# Patient Record
Sex: Female | Born: 1935 | Race: White | Hispanic: No | Smoking: Never smoker
Health system: Southern US, Community
[De-identification: ages and names within clinical notes are randomized; demographics above are authoritative.]

## PROBLEM LIST (undated history)

## (undated) DIAGNOSIS — E119 Type 2 diabetes mellitus without complications: Secondary | ICD-10-CM

## (undated) DIAGNOSIS — I10 Essential (primary) hypertension: Secondary | ICD-10-CM

## (undated) DIAGNOSIS — K219 Gastro-esophageal reflux disease without esophagitis: Secondary | ICD-10-CM

## (undated) DIAGNOSIS — N189 Chronic kidney disease, unspecified: Secondary | ICD-10-CM

## (undated) DIAGNOSIS — C801 Malignant (primary) neoplasm, unspecified: Secondary | ICD-10-CM

## (undated) DIAGNOSIS — E039 Hypothyroidism, unspecified: Secondary | ICD-10-CM

## (undated) DIAGNOSIS — E785 Hyperlipidemia, unspecified: Secondary | ICD-10-CM

## (undated) HISTORY — PX: COLONOSCOPY: SHX174

## (undated) HISTORY — PX: SIGMOIDOSCOPY: SUR1295

## (undated) HISTORY — DX: Essential (primary) hypertension: I10

## (undated) HISTORY — DX: Type 2 diabetes mellitus without complications: E11.9

## (undated) HISTORY — DX: Hyperlipidemia, unspecified: E78.5

## (undated) HISTORY — PX: CHOLECYSTECTOMY: SHX55

## (undated) HISTORY — DX: Chronic kidney disease, unspecified: N18.9

---

## 2011-07-02 ENCOUNTER — Encounter: Payer: Self-pay | Admitting: Gastroenterology

## 2011-07-07 ENCOUNTER — Ambulatory Visit (AMBULATORY_SURGERY_CENTER): Payer: Medicare Other

## 2011-07-07 VITALS — Ht 64.0 in | Wt 140.3 lb

## 2011-07-07 DIAGNOSIS — Z1211 Encounter for screening for malignant neoplasm of colon: Secondary | ICD-10-CM

## 2011-07-07 DIAGNOSIS — Z800 Family history of malignant neoplasm of digestive organs: Secondary | ICD-10-CM

## 2011-07-07 MED ORDER — PEG-KCL-NACL-NASULF-NA ASC-C 100 G PO SOLR: 1.0000 | Freq: Once | ORAL | Status: AC

## 2011-07-20 ENCOUNTER — Ambulatory Visit (AMBULATORY_SURGERY_CENTER): Payer: Medicare Other | Admitting: Gastroenterology

## 2011-07-20 ENCOUNTER — Encounter: Payer: Self-pay | Admitting: Gastroenterology

## 2011-07-20 DIAGNOSIS — D126 Benign neoplasm of colon, unspecified: Secondary | ICD-10-CM

## 2011-07-20 DIAGNOSIS — K5730 Diverticulosis of large intestine without perforation or abscess without bleeding: Secondary | ICD-10-CM | POA: Insufficient documentation

## 2011-07-20 DIAGNOSIS — Z1211 Encounter for screening for malignant neoplasm of colon: Secondary | ICD-10-CM

## 2011-07-20 MED ORDER — SODIUM CHLORIDE 0.9 % IV SOLN: 500.0000 mL | INTRAVENOUS | Status: DC

## 2011-07-20 NOTE — Progress Notes (Signed)
Patient did not experience any of the following events: a burn prior to discharge; a fall within the facility; wrong site/side/patient/procedure/implant event; or a hospital transfer or hospital admission upon discharge from the facility. (G8907) Patient did not have preoperative order for IV antibiotic SSI prophylaxis. (G8918)  

## 2011-07-20 NOTE — Patient Instructions (Signed)

## 2011-07-20 NOTE — Op Note (Addendum)
[facility] [address] [place], [place]  [address]  COLONOSCOPY PROCEDURE REPORT  PATIENT:  Sophia, [name]  MR#:  [id number] BIRTHDATE:  [birth year], 79 yrs. old  GENDER:  female ENDOSCOPIST:  Vania Rea. Jarold Motto, MD, [facility] REF. BY:  Paulita Cradle, N.P. PROCEDURE DATE:  07/20/2011 PROCEDURE:  Colonoscopy with biopsy ASA CLASS:  Class III INDICATIONS:  Routine Risk Screening, family history of colon cancer SISTER MEDICATIONS:   propofol (Diprivan) 120 mg IV  DESCRIPTION OF PROCEDURE:   After the risks and benefits and of the procedure were explained, informed consent was obtained. Digital rectal exam was performed and revealed no abnormalities. The LB 180AL [id number] endoscope was introduced through the anus and advanced to the cecum, which was identified by both the appendix and ileocecal valve.  The quality of the prep was excellent, using MoviPrep.  The instrument was then slowly withdrawn as the colon was fully examined. <<PROCEDUREIMAGES>>  FINDINGS:  There were mild diverticular changes in left colon. diverticulosis was found. POLYP COLD SNARE REMOVED IN RECTUM  A diminutive polyp was found in the rectum.   Retroflexed views in the rectum revealed no abnormalities.    The scope was then withdrawn from the patient and the procedure completed.  COMPLICATIONS:  None ENDOSCOPIC IMPRESSION: 1) Diverticulosis,mild,left sided diverticulosis 2) Diminutive polyp in the rectum R/O ADENOMA RECOMMENDATIONS: 1) Continue current medications 2) High fiber diet.  REPEAT EXAM:  No  ______________________________ Vania Rea. Jarold Motto, MD, [facility]  CC:  Rudi Heap, MD  n. REVISED:  07/20/2011 10:20 AM eSIGNED:   Vania Rea. Marlo Arriola at 07/20/2011 10:20 AM  Philis Nettle, [id number]

## 2011-07-21 ENCOUNTER — Telehealth: Payer: Self-pay

## 2011-07-21 NOTE — Telephone Encounter (Signed)
  Follow up Call-  Call back number 07/20/2011  Post procedure Call Back phone  # [phone]  Permission to leave phone message No     Patient questions:  Do you have a fever, pain , or abdominal swelling? no Pain Score  0 *  Have you tolerated food without any problems? yes  Have you been able to return to your normal activities? yes  Do you have any questions about your discharge instructions: Diet   no Medications  no Follow up visit  no  Do you have questions or concerns about your Care? no  Actions: * If pain score is 4 or above: No action needed, pain <4.

## 2011-07-24 ENCOUNTER — Encounter: Payer: Self-pay | Admitting: Gastroenterology

## 2012-09-09 ENCOUNTER — Other Ambulatory Visit: Payer: Self-pay

## 2012-09-09 MED ORDER — LISINOPRIL 5 MG PO TABS: 5.0000 mg | ORAL_TABLET | Freq: Every day | ORAL | Status: DC

## 2012-10-21 ENCOUNTER — Telehealth: Payer: Self-pay | Admitting: Nurse Practitioner

## 2012-10-21 NOTE — Telephone Encounter (Signed)
appt made with wong on monday

## 2012-10-24 ENCOUNTER — Encounter: Payer: Self-pay | Admitting: Family Medicine

## 2012-10-24 ENCOUNTER — Ambulatory Visit (INDEPENDENT_AMBULATORY_CARE_PROVIDER_SITE_OTHER): Payer: Medicare Other | Admitting: Family Medicine

## 2012-10-24 VITALS — BP 156/72 | HR 65 | Temp 97.5°F | Wt 141.2 lb

## 2012-10-24 DIAGNOSIS — R7309 Other abnormal glucose: Secondary | ICD-10-CM

## 2012-10-24 DIAGNOSIS — E559 Vitamin D deficiency, unspecified: Secondary | ICD-10-CM | POA: Insufficient documentation

## 2012-10-24 DIAGNOSIS — M810 Age-related osteoporosis without current pathological fracture: Secondary | ICD-10-CM | POA: Insufficient documentation

## 2012-10-24 DIAGNOSIS — I10 Essential (primary) hypertension: Secondary | ICD-10-CM | POA: Insufficient documentation

## 2012-10-24 DIAGNOSIS — E785 Hyperlipidemia, unspecified: Secondary | ICD-10-CM | POA: Insufficient documentation

## 2012-10-24 DIAGNOSIS — R739 Hyperglycemia, unspecified: Secondary | ICD-10-CM | POA: Insufficient documentation

## 2012-10-24 LAB — COMPLETE METABOLIC PANEL WITH GFR
ALT: 15 U/L (ref 0–35)
AST: 22 U/L (ref 0–37)
Albumin: 4.3 g/dL (ref 3.5–5.2)
Alkaline Phosphatase: 47 U/L (ref 39–117)
BUN: 18 mg/dL (ref 6–23)
CO2: 24 mEq/L (ref 19–32)
Calcium: 9.9 mg/dL (ref 8.4–10.5)
Chloride: 107 mEq/L (ref 96–112)
Creat: 1.28 mg/dL — ABNORMAL HIGH (ref 0.50–1.10)
GFR, Est African American: 46 mL/min — ABNORMAL LOW
GFR, Est Non African American: 40 mL/min — ABNORMAL LOW
Glucose, Bld: 99 mg/dL (ref 70–99)
Potassium: 4.5 mEq/L (ref 3.5–5.3)
Sodium: 141 mEq/L (ref 135–145)
Total Bilirubin: 0.8 mg/dL (ref 0.3–1.2)
Total Protein: 7.3 g/dL (ref 6.0–8.3)

## 2012-10-24 LAB — POCT GLYCOSYLATED HEMOGLOBIN (HGB A1C): Hemoglobin A1C: 6.7

## 2012-10-24 MED ORDER — LISINOPRIL 5 MG PO TABS: 5.0000 mg | ORAL_TABLET | Freq: Every day | ORAL | Status: DC

## 2012-10-24 NOTE — Progress Notes (Signed)
Patient ID: Sophia [name], female   DOB: [birth year], 79 y.o.   MRN: [id number] SUBJECTIVE: Chief Complaint  Patient presents with  . Follow-up    3 month pt of DFS  needs refills [facility] fasting   HPI: Patient is here for follow up of hyperlipidemia/hypertension/hyperglycemia:  denies Headache;denies Chest Pain;denies weakness;denies Shortness of Breath and orthopnea;denies Visual changes;denies palpitations;denies cough;denies pedal edema;denies symptoms of TIA or stroke;deniesClaudication symptoms. admits to Compliance with medications; denies Problems with medications.  PMH/PSH: reviewed/updated in Epic  SH/FH: reviewed/updated in Epic  Allergies: reviewed/updated in Epic  Medications: reviewed/updated in Epic  Immunizations: reviewed/updated in Epic  ROS: As above in the HPI. All other systems are stable or negative.  OBJECTIVE: APPEARANCE:  Patient in no acute distress.The patient appeared well nourished and normally developed. Acyanotic. Waist: VITAL SIGNS:BP 156/72  Pulse 65  Temp(Src) 97.5 F (36.4 C) (Oral)  Wt 141 lb 3.2 oz (64.048 kg)  BMI 24.23 kg/m2 Recheck 138/80 WF   SKIN: warm and  Dry without overt rashes, tattoos and scars  HEAD and Neck: without JVD, Head and scalp: normal Eyes:No scleral icterus. Fundi normal, eye movements normal. Ears: Auricle normal, canal normal, Tympanic membranes normal, insufflation normal. Nose: normal Throat: normal Neck & thyroid: normal  CHEST & LUNGS: Chest wall: normal Lungs: Clear  CVS: Reveals the PMI to be normally located. Regular rhythm, First and Second Heart sounds are normal,  absence of murmurs, rubs or gallops. Peripheral vasculature: Radial pulses: normal Dorsal pedis pulses: normal Posterior pulses: normal  ABDOMEN:  Appearance: normal Benign, no organomegaly, no masses, no Abdominal Aortic enlargement. No Guarding , no rebound. No Bruits. Bowel sounds: normal  RECTAL: N/A GU:  N/A  EXTREMETIES: nonedematous. Both Femoral and Pedal pulses are normal.  MUSCULOSKELETAL:  Spine: normal Joints: intact  NEUROLOGIC: oriented to time,place and person; nonfocal. Strength is normal Sensory is normal Reflexes are normal Cranial Nerves are normal.  ASSESSMENT: HTN (hypertension) - Plan: COMPLETE METABOLIC PANEL WITH GFR, lisinopril (PRINIVIL,ZESTRIL) 5 MG tablet  HLD (hyperlipidemia) - Plan: COMPLETE METABOLIC PANEL WITH GFR, NMR Lipoprofile with Lipids  Hyperglycemia - Plan: COMPLETE METABOLIC PANEL WITH GFR, POCT glycosylated hemoglobin (Hb A1C)  Unspecified vitamin D deficiency - Plan: Vitamin D 25 hydroxy  Osteoporosis, unspecified   PLAN: Orders Placed This Encounter  Procedures  . COMPLETE METABOLIC PANEL WITH GFR  . NMR Lipoprofile with Lipids  . Vitamin D 25 hydroxy  . POCT glycosylated hemoglobin (Hb A1C)    No results found for this or any previous visit. Meds ordered this encounter  Medications  . lisinopril (PRINIVIL,ZESTRIL) 5 MG tablet    Sig: Take 1 tablet (5 mg total) by mouth daily.    Dispense:  30 tablet    Refill:  11         Dr Woodroe Mode Recommendations  Diet and Exercise discussed with patient.  For nutrition information, I recommend books:  1).Eat to Live by Dr Monico Hoar. 2).Prevent and Reverse Heart Disease by Dr Suzzette Righter. 3) Dr Katherina Right Book: Reversing Diabetes  Exercise recommendations are:  If unable to walk, then the patient can exercise in a chair 3 times a day. By flapping arms like a bird gently and raising legs outwards to the front.  If ambulatory, the patient can go for walks for 30 minutes 3 times a week. Then increase the intensity and duration as tolerated.  Goal is to try to attain exercise frequency to 5 times a week.  If applicable: Best  to perform resistance exercises (machines or weights) 2 days a week and cardio type exercises 3 days per week.  Return in about 3 months  (around 01/24/2013) for Recheck medical problems.  Rilynn Habel P. Modesto Charon, M.D.

## 2012-10-24 NOTE — Patient Instructions (Signed)
      Dr Makynleigh Breslin's Recommendations  Diet and Exercise discussed with patient.  For nutrition information, I recommend books:  1).Eat to Live by Dr Joel Fuhrman. 2).Prevent and Reverse Heart Disease by Dr Caldwell Esselstyn. 3) Dr Neal Barnard's Book: Reversing Diabetes  Exercise recommendations are:  If unable to walk, then the patient can exercise in a chair 3 times a day. By flapping arms like a bird gently and raising legs outwards to the front.  If ambulatory, the patient can go for walks for 30 minutes 3 times a week. Then increase the intensity and duration as tolerated.  Goal is to try to attain exercise frequency to 5 times a week.  If applicable: Best to perform resistance exercises (machines or weights) 2 days a week and cardio type exercises 3 days per week.  

## 2012-10-25 LAB — NMR LIPOPROFILE WITH LIPIDS
Cholesterol, Total: 181 mg/dL (ref <200)
HDL Particle Number: 37.6 umol/L (ref >=30.5)
HDL Size: 9.3 nm (ref >=9.2)
HDL-C: 64 mg/dL (ref >=40)
LDL (calc): 92 mg/dL (ref <100)
LDL Particle Number: 1131 nmol/L — ABNORMAL HIGH (ref <1000)
LDL Size: 21.2 nm (ref >20.5)
LP-IR Score: 39 (ref <=45)
Large HDL-P: 8.6 umol/L (ref >=4.8)
Large VLDL-P: 3.2 nmol/L — ABNORMAL HIGH (ref <=2.7)
Small LDL Particle Number: 430 nmol/L (ref <=527)
Triglycerides: 123 mg/dL (ref <150)
VLDL Size: 46.8 nm — ABNORMAL HIGH (ref <=46.6)

## 2012-10-25 LAB — VITAMIN D 25 HYDROXY (VIT D DEFICIENCY, FRACTURES): Vit D, 25-Hydroxy: 27 ng/mL — ABNORMAL LOW (ref 30–89)

## 2012-10-28 ENCOUNTER — Other Ambulatory Visit: Payer: Self-pay | Admitting: Family Medicine

## 2012-11-28 ENCOUNTER — Other Ambulatory Visit: Payer: Self-pay | Admitting: *Deleted

## 2012-11-28 MED ORDER — NIACIN-SIMVASTATIN ER 1000-20 MG PO TB24: 1.0000 | ORAL_TABLET | Freq: Every day | ORAL | Status: DC

## 2012-11-30 ENCOUNTER — Encounter: Payer: Self-pay | Admitting: *Deleted

## 2012-12-26 ENCOUNTER — Ambulatory Visit: Payer: Self-pay

## 2013-01-05 ENCOUNTER — Ambulatory Visit (INDEPENDENT_AMBULATORY_CARE_PROVIDER_SITE_OTHER): Payer: Medicare Other | Admitting: Pharmacist

## 2013-01-05 VITALS — BP 146/82 | HR 64 | Ht 64.0 in | Wt 140.5 lb

## 2013-01-05 DIAGNOSIS — M810 Age-related osteoporosis without current pathological fracture: Secondary | ICD-10-CM

## 2013-01-05 DIAGNOSIS — R7309 Other abnormal glucose: Secondary | ICD-10-CM

## 2013-01-05 DIAGNOSIS — E559 Vitamin D deficiency, unspecified: Secondary | ICD-10-CM

## 2013-01-05 DIAGNOSIS — R739 Hyperglycemia, unspecified: Secondary | ICD-10-CM

## 2013-01-05 DIAGNOSIS — E785 Hyperlipidemia, unspecified: Secondary | ICD-10-CM

## 2013-01-05 DIAGNOSIS — I10 Essential (primary) hypertension: Secondary | ICD-10-CM

## 2013-01-05 MED ORDER — SIMVASTATIN 40 MG PO TABS: 40.0000 mg | ORAL_TABLET | Freq: Every evening | ORAL | Status: DC

## 2013-01-05 MED ORDER — VALSARTAN-HYDROCHLOROTHIAZIDE 320-25 MG PO TABS: 1.0000 | ORAL_TABLET | Freq: Every day | ORAL | Status: DC

## 2013-01-05 NOTE — Progress Notes (Signed)
Lipid Clinic Consultation  Chief Complaint:   Chief Complaint  Patient presents with  . Diabetes  . Hyperlipidemia  . Hypertension   HPI:   Patient is referred by Dr Modesto Charon for evaluation of hyperlipidemia and newly diagnosed type 2 DM.  Her last labs showed a normal BG of 99 but A1C was 6.7%. She also has elevated LDL-P of 1131.  She also mentions that she thinks that her lisinopril might be causing a cough.  She has heard that that type of medication can cause a cough.  Current medications include: Simcor 20/1000mg  daily, lisinopril 5mg  daily, Benicar HCT 40/25mg  daily,  ASA 81mg  daily, Calcium + Vit D 1 tablet BID, MVI daily, Vitamin D 1000IU daily (just restarted in June 2014).   Note - her profile showed that she was taking alendronate but she has not taken since 02/2012.  She was started on Evista 02/2012 but did not continue due to cost.  Exaam: Edema:  nagative  Carotid Bruits:  negative Xanthomas:  negative General Appearance:  alert, oriented, no acute distress and well nourished Mood/Affect:  normal  Lipid Panel     Component Value Date/Time   TRIG 123 10/24/2012 1139   LDLCALC 92 10/24/2012 1139  LDL-P was 1131    Assessment: CHD/CHF Risk Equivalents:  DM  NCEP Risk Factors Present:  HTN and age Primary Problem(s):  LDL or LDL-P elevated  Current NCEP Goals: LDL Goal < 100 HDL Goal >/= 45 Tg Goal < 829 Non-HDL Goal < 130  Low fat diet followed?  No -   Low carb diet followed?  No -   Exercise?  No -    Assessment: 1. Newly Diagnosed diabetes 2. Dyslipidemia - not at goals and current therapy possibly worsening DM. 3.  HTN - concern that cough might be related to ACE-Inhibitor / lisinopril  Recommendations: 1.  Discussed CHO serving sizes and effects on BG.   2.  Discontinue Simcor.  Start sinvastatin 40mg  daily 3.  Discontinue lisinopril to see if cough resolves.  Also discontinue Benicar HCT and switch to cheaper valsartan HCTZ 320/25mg  1 tablet daily.   Continue to monitor BP daily at home.  4.  Dicussed restarting evista since it is now generic but since we are making other changes today will discuss at next appt.   RTC in 1 month to see Dr Modesto Charon.  RTC in 2 months clinical pharmacist

## 2013-01-31 ENCOUNTER — Ambulatory Visit (INDEPENDENT_AMBULATORY_CARE_PROVIDER_SITE_OTHER): Payer: Medicare Other | Admitting: Family Medicine

## 2013-01-31 ENCOUNTER — Encounter: Payer: Self-pay | Admitting: Family Medicine

## 2013-01-31 VITALS — BP 140/82 | HR 78 | Temp 97.0°F | Ht 63.5 in | Wt 132.6 lb

## 2013-01-31 DIAGNOSIS — E559 Vitamin D deficiency, unspecified: Secondary | ICD-10-CM

## 2013-01-31 DIAGNOSIS — E785 Hyperlipidemia, unspecified: Secondary | ICD-10-CM

## 2013-01-31 DIAGNOSIS — M810 Age-related osteoporosis without current pathological fracture: Secondary | ICD-10-CM

## 2013-01-31 DIAGNOSIS — I10 Essential (primary) hypertension: Secondary | ICD-10-CM

## 2013-01-31 DIAGNOSIS — E119 Type 2 diabetes mellitus without complications: Secondary | ICD-10-CM | POA: Insufficient documentation

## 2013-01-31 DIAGNOSIS — R7309 Other abnormal glucose: Secondary | ICD-10-CM

## 2013-01-31 DIAGNOSIS — N189 Chronic kidney disease, unspecified: Secondary | ICD-10-CM | POA: Insufficient documentation

## 2013-01-31 DIAGNOSIS — R739 Hyperglycemia, unspecified: Secondary | ICD-10-CM

## 2013-01-31 LAB — POCT GLYCOSYLATED HEMOGLOBIN (HGB A1C): Hemoglobin A1C: 6.1

## 2013-01-31 NOTE — Progress Notes (Signed)
Patient ID: Sophia [name], female   DOB: [birth year], 79 y.o.   MRN: [id number] SUBJECTIVE: CC: Chief Complaint  Patient presents with  . Follow-up    3 month  fastng    HPI: Had a tear duct infection and was on antibiotics which made her sick and she lost weight from lost apetite.  Patient is here for follow up of Diabetes Mellitus/CKD/HTN/HLD: Symptoms evaluated: Denies Nocturia ,Denies Urinary Frequency , denies Blurred vision ,deniesDizziness,denies.Dysuria,denies paresthesias, denies extremity pain or ulcers.Marland Kitchendenies chest pain. has had an annual eye exam. do check the feet. Does check CBGs. Average CBG: Denies episodes of hypoglycemia. Does have an emergency hypoglycemic plan. admits toCompliance with medications. Denies Problems with medications.  Past Medical History  Diagnosis Date  . Hypertension   . Hyperlipidemia    Past Surgical History  Procedure Laterality Date  . Cholecystectomy    . Sigmoidoscopy     History   Social History  . Marital Status: Married    Spouse Name: N/A    Number of Children: N/A  . Years of Education: N/A   Occupational History  . Not on file.   Social History Main Topics  . Smoking status: Never Smoker   . Smokeless tobacco: Never Used  . Alcohol Use: No  . Drug Use: No  . Sexual Activity: Not on file   Other Topics Concern  . Not on file   Social History Narrative  . No narrative on file   Family History  Problem Relation Age of Onset  . Heart disease Father   . Colon cancer Sister   . Heart disease Brother   . Heart disease Sister    Current Outpatient Prescriptions on File Prior to Visit  Medication Sig Dispense Refill  . aspirin 81 MG tablet Take 81 mg by mouth daily.      . CA & PHOS-VIT D-MAG PO Take by mouth. Take 2 daily      . Cholecalciferol (VITAMIN D-3 PO) Take by mouth. Take 1200 mg daily      . Multiple Vitamins-Minerals (MULTIVITAMIN WITH MINERALS) tablet Take 1 tablet by mouth daily.      .  simvastatin (ZOCOR) 40 MG tablet Take 1 tablet (40 mg total) by mouth every evening.  90 tablet  1  . valsartan-hydrochlorothiazide (DIOVAN HCT) 320-25 MG per tablet Take 1 tablet by mouth daily.  90 tablet  1   No current facility-administered medications on file prior to visit.   No Known Allergies Immunization History  Administered Date(s) Administered  . Pneumococcal Polysaccharide 05/18/2002   Prior to Admission medications   Medication Sig Start Date End Date Taking? Authorizing Provider  aspirin 81 MG tablet Take 81 mg by mouth daily.   Yes Historical Provider, MD  BENICAR HCT 40-25 MG per tablet  10/31/12  Yes Historical Provider, MD  CA & PHOS-VIT D-MAG PO Take by mouth. Take 2 daily   Yes Historical Provider, MD  Cholecalciferol (VITAMIN D-3 PO) Take by mouth. Take 1200 mg daily   Yes Historical Provider, MD  hydrocortisone cream 1 %  12/08/12  Yes Historical Provider, MD  Multiple Vitamins-Minerals (MULTIVITAMIN WITH MINERALS) tablet Take 1 tablet by mouth daily.   Yes Historical Provider, MD  neomycin-polymyxin b-dexamethasone (MAXITROL) 3.5-10000-0.1 OINT  01/16/13  Yes Historical Provider, MD  simvastatin (ZOCOR) 40 MG tablet Take 1 tablet (40 mg total) by mouth every evening. 01/05/13  Yes Tammy Eckard, PHARMD  valsartan-hydrochlorothiazide (DIOVAN HCT) 320-25 MG per tablet Take 1 tablet by  mouth daily. 01/05/13   Tammy Eckard, PHARMD    ROS: As above in the HPI. All other systems are stable or negative.  OBJECTIVE: APPEARANCE:  Patient in no acute distress.The patient appeared well nourished and normally developed. Acyanotic. Waist: VITAL SIGNS:BP 140/82  Pulse 78  Temp(Src) 97 F (36.1 C) (Oral)  Ht 5' 3.5" (1.613 m)  Wt 132 lb 9.6 oz (60.147 kg)  BMI 23.12 kg/m2 WF  SKIN: warm and  Dry without overt rashes, tattoos and scars  HEAD and Neck: without JVD, Head and scalp: normal Eyes:No scleral icterus. Fundi normal, eye movements normal. Ears: Auricle normal,  canal normal, Tympanic membranes normal, insufflation normal. Nose: normal Throat: normal Neck & thyroid: normal  CHEST & LUNGS: Chest wall: normal Lungs: Clear  CVS: Reveals the PMI to be normally located. Regular rhythm, First and Second Heart sounds are normal,  absence of murmurs, rubs or gallops. Peripheral vasculature: Radial pulses: normal Dorsal pedis pulses: normal Posterior pulses: normal  ABDOMEN:  Appearance: normal Benign, no organomegaly, no masses, no Abdominal Aortic enlargement. No Guarding , no rebound. No Bruits. Bowel sounds: normal  RECTAL: N/A GU: N/A  EXTREMETIES: nonedematous.  MUSCULOSKELETAL:  Spine: normal Joints: intact  NEUROLOGIC: oriented to time,place and person; nonfocal. Strength is normal Sensory is normal Reflexes are normal Cranial Nerves are normal. Results for orders placed in visit on 01/31/13  POCT GLYCOSYLATED HEMOGLOBIN (HGB A1C)      Result Value Range   Hemoglobin A1C 6.1      ASSESSMENT: Hyperglycemia - Plan: POCT glycosylated hemoglobin (Hb A1C), CMP14+EGFR  HLD (hyperlipidemia) - Plan: CMP14+EGFR, NMR, lipoprofile  Chronic kidney disease - Plan: CMP14+EGFR  HTN (hypertension) - Plan: CMP14+EGFR  Osteoporosis, unspecified - Plan: Vit D  25 hydroxy (rtn osteoporosis monitoring)  Unspecified vitamin D deficiency - Plan: Vit D  25 hydroxy (rtn osteoporosis monitoring)  PLAN: Orders Placed This Encounter  Procedures  . CMP14+EGFR  . NMR, lipoprofile  . Vit D  25 hydroxy (rtn osteoporosis monitoring)  . POCT glycosylated hemoglobin (Hb A1C)   Meds ordered this encounter  Medications  . hydrocortisone cream 1 %    Sig:   . neomycin-polymyxin b-dexamethasone (MAXITROL) 3.5-10000-0.1 OINT    Sig:   . BENICAR HCT 40-25 MG per tablet    Sig:    Keep active, balanced diet and exercise. Continue with Tammy's recommendations.  Return in about 3 months (around 05/02/2013) for Recheck medical  problems.  Jameela Michna P. Modesto Charon, M.D.

## 2013-02-02 LAB — NMR, LIPOPROFILE
Cholesterol: 140 mg/dL (ref <200)
HDL Cholesterol by NMR: 80 mg/dL (ref >=40)
HDL Particle Number: 38.3 umol/L (ref >=30.5)
LDL Particle Number: 519 nmol/L (ref <1000)
LDL Size: 21.7 nm (ref >20.5)
LDLC SERPL CALC-MCNC: 43 mg/dL (ref <100)
LP-IR Score: <25 (ref <=45)
Small LDL Particle Number: <90 nmol/L (ref <=527)
Triglycerides by NMR: 86 mg/dL (ref <150)

## 2013-02-02 LAB — CMP14+EGFR
ALT: 15 IU/L (ref 0–32)
AST: 26 IU/L (ref 0–40)
Albumin/Globulin Ratio: 1.5 (ref 1.1–2.5)
Albumin: 4.5 g/dL (ref 3.5–4.8)
Alkaline Phosphatase: 63 IU/L (ref 39–117)
BUN/Creatinine Ratio: 29 — ABNORMAL HIGH (ref 11–26)
BUN: 39 mg/dL — ABNORMAL HIGH (ref 8–27)
CO2: 25 mmol/L (ref 18–29)
Calcium: 10.5 mg/dL — ABNORMAL HIGH (ref 8.6–10.2)
Chloride: 101 mmol/L (ref 97–108)
Creatinine, Ser: 1.35 mg/dL — ABNORMAL HIGH (ref 0.57–1.00)
GFR calc Af Amer: 43 mL/min/{1.73_m2} — ABNORMAL LOW (ref >59)
GFR calc non Af Amer: 38 mL/min/{1.73_m2} — ABNORMAL LOW (ref >59)
Globulin, Total: 3 g/dL (ref 1.5–4.5)
Glucose: 99 mg/dL (ref 65–99)
Potassium: 5 mmol/L (ref 3.5–5.2)
Sodium: 143 mmol/L (ref 134–144)
Total Bilirubin: 0.8 mg/dL (ref 0.0–1.2)
Total Protein: 7.5 g/dL (ref 6.0–8.5)

## 2013-02-02 LAB — VITAMIN D 25 HYDROXY (VIT D DEFICIENCY, FRACTURES): Vit D, 25-Hydroxy: 32.7 ng/mL (ref 30.0–100.0)

## 2013-05-05 ENCOUNTER — Ambulatory Visit: Payer: Medicare Other | Admitting: Family Medicine

## 2013-05-30 ENCOUNTER — Ambulatory Visit: Payer: Medicare Other | Admitting: Family Medicine

## 2013-06-22 ENCOUNTER — Ambulatory Visit (INDEPENDENT_AMBULATORY_CARE_PROVIDER_SITE_OTHER): Payer: Medicare Other | Admitting: Family Medicine

## 2013-06-22 ENCOUNTER — Encounter: Payer: Self-pay | Admitting: Family Medicine

## 2013-06-22 VITALS — BP 150/73 | HR 70 | Temp 98.0°F | Ht 64.0 in | Wt 137.8 lb

## 2013-06-22 DIAGNOSIS — I10 Essential (primary) hypertension: Secondary | ICD-10-CM

## 2013-06-22 DIAGNOSIS — R7309 Other abnormal glucose: Secondary | ICD-10-CM | POA: Diagnosis not present

## 2013-06-22 DIAGNOSIS — E785 Hyperlipidemia, unspecified: Secondary | ICD-10-CM | POA: Diagnosis not present

## 2013-06-22 DIAGNOSIS — E559 Vitamin D deficiency, unspecified: Secondary | ICD-10-CM | POA: Diagnosis not present

## 2013-06-22 DIAGNOSIS — N189 Chronic kidney disease, unspecified: Secondary | ICD-10-CM | POA: Diagnosis not present

## 2013-06-22 DIAGNOSIS — R739 Hyperglycemia, unspecified: Secondary | ICD-10-CM

## 2013-06-22 DIAGNOSIS — M810 Age-related osteoporosis without current pathological fracture: Secondary | ICD-10-CM

## 2013-06-22 LAB — POCT GLYCOSYLATED HEMOGLOBIN (HGB A1C): Hemoglobin A1C: 6.2

## 2013-06-22 NOTE — Progress Notes (Signed)
Patient ID: Draven Laine, female   DOB: Nov 10, 1931, 80 y.o.   MRN: [id number] SUBJECTIVE: CC: Chief Complaint  Patient presents with  . Follow-up    3 month follow up chronic problems     HPI: Patient is here for follow up of Diabetes Mellitus/HLD/HTN/CKD: Symptoms evaluated: Denies Nocturia ,Denies Urinary Frequency , denies Blurred vision ,deniesDizziness,denies.Dysuria,denies paresthesias, denies extremity pain or ulcers.Marland Kitchendenies chest pain. has had an annual eye exam. do check the feet. Denies episodes of hypoglycemia. Does have an emergency hypoglycemic plan. admits toCompliance with medications. Denies Problems with medications.: did reduce the simvastatin to 1/2 of the 40 mg dose.    Past Medical History  Diagnosis Date  . Hypertension   . Hyperlipidemia   . Diabetes mellitus without complication   . Chronic kidney disease    Past Surgical History  Procedure Laterality Date  . Cholecystectomy    . Sigmoidoscopy     History   Social History  . Marital Status: Married    Spouse Name: N/A    Number of Children: N/A  . Years of Education: N/A   Occupational History  . Not on file.   Social History Main Topics  . Smoking status: Never Smoker   . Smokeless tobacco: Never Used  . Alcohol Use: No  . Drug Use: No  . Sexual Activity: Not on file   Other Topics Concern  . Not on file   Social History Narrative  . No narrative on file   Family History  Problem Relation Age of Onset  . Heart disease Father   . Colon cancer Sister   . Heart disease Brother   . Heart disease Sister    Current Outpatient Prescriptions on File Prior to Visit  Medication Sig Dispense Refill  . aspirin 81 MG tablet Take 81 mg by mouth daily.      . Cholecalciferol (VITAMIN D-3 PO) Take by mouth. Take 1200 mg daily      . valsartan-hydrochlorothiazide (DIOVAN HCT) 320-25 MG per tablet Take 1 tablet by mouth daily.  90 tablet  1  . CA & PHOS-VIT D-MAG PO Take by mouth. Take  2 daily      . hydrocortisone cream 1 %       . Multiple Vitamins-Minerals (MULTIVITAMIN WITH MINERALS) tablet Take 1 tablet by mouth daily.      Marland Kitchen neomycin-polymyxin b-dexamethasone (MAXITROL) 3.5-10000-0.1 OINT        No current facility-administered medications on file prior to visit.   No Known Allergies Immunization History  Administered Date(s) Administered  . Influenza, High Dose Seasonal PF 03/13/2013  . Pneumococcal Polysaccharide-23 05/18/2002   Prior to Admission medications   Medication Sig Start Date End Date Taking? Authorizing Provider  aspirin 81 MG tablet Take 81 mg by mouth daily.    Historical Provider, MD  BENICAR HCT 40-25 MG per tablet  10/31/12   Historical Provider, MD  CA & PHOS-VIT D-MAG PO Take by mouth. Take 2 daily    Historical Provider, MD  Cholecalciferol (VITAMIN D-3 PO) Take by mouth. Take 1200 mg daily    Historical Provider, MD  hydrocortisone cream 1 %  12/08/12   Historical Provider, MD  Multiple Vitamins-Minerals (MULTIVITAMIN WITH MINERALS) tablet Take 1 tablet by mouth daily.    Historical Provider, MD  neomycin-polymyxin b-dexamethasone (MAXITROL) 3.5-10000-0.1 OINT  01/16/13   Historical Provider, MD  simvastatin (ZOCOR) 40 MG tablet Take 1 tablet (40 mg total) by mouth every evening. 01/05/13   Cherre Robins, PHARMD  valsartan-hydrochlorothiazide (DIOVAN HCT) 320-25 MG per tablet Take 1 tablet by mouth daily. 01/05/13   Tammy Eckard, PHARMD     ROS: As above in the HPI. All other systems are stable or negative.  OBJECTIVE: APPEARANCE:  Patient in no acute distress.The patient appeared well nourished and normally developed. Acyanotic. Waist: VITAL SIGNS:BP 150/73  Pulse 70  Temp(Src) 98 F (36.7 C) (Oral)  Ht _0  (1.626 m)  Wt 137 lb 12.8 oz (62.506 kg)  BMI 23.64 kg/m2  WF  SKIN: warm and  Dry without overt rashes, tattoos and scars  HEAD and Neck: without JVD, Head and scalp: normal Eyes:No scleral icterus. Fundi normal, eye  movements normal. Ears: Auricle normal, canal normal, Tympanic membranes normal, insufflation normal. Nose: normal Throat: normal Neck & thyroid: normal  CHEST & LUNGS: Chest wall: normal Lungs: Clear  CVS: Reveals the PMI to be normally located. Regular rhythm, First and Second Heart sounds are normal,  absence of murmurs, rubs or gallops. Peripheral vasculature: Radial pulses: normal Dorsal pedis pulses: normal Posterior pulses: normal  ABDOMEN:  Appearance: normal Benign, no organomegaly, no masses, no Abdominal Aortic enlargement. No Guarding , no rebound. No Bruits. Bowel sounds: normal  RECTAL: N/A GU: N/A  EXTREMETIES: nonedematous.  MUSCULOSKELETAL:  Spine: normal Joints: intact  NEUROLOGIC: oriented to time,place and person; nonfocal. Strength is normal Sensory is normal Reflexes are normal Cranial Nerves are normal.   Results for orders placed in visit on 01/31/13  CMP14+EGFR      Result Value Range   Glucose 99  65 - 99 mg/dL   BUN 39 (*) 8 - 27 mg/dL   Creatinine, Ser 1.35 (*) 0.57 - 1.00 mg/dL   GFR calc non Af Amer 38 (*) >59 mL/min/1.73   GFR calc Af Amer 43 (*) >59 mL/min/1.73   BUN/Creatinine Ratio 29 (*) 11 - 26   Sodium 143  134 - 144 mmol/L   Potassium 5.0  3.5 - 5.2 mmol/L   Chloride 101  97 - 108 mmol/L   CO2 25  18 - 29 mmol/L   Calcium 10.5 (*) 8.6 - 10.2 mg/dL   Total Protein 7.5  6.0 - 8.5 g/dL   Albumin 4.5  3.5 - 4.8 g/dL   Globulin, Total 3.0  1.5 - 4.5 g/dL   Albumin/Globulin Ratio 1.5  1.1 - 2.5   Total Bilirubin 0.8  0.0 - 1.2 mg/dL   Alkaline Phosphatase 63  39 - 117 IU/L   AST 26  0 - 40 IU/L   ALT 15  0 - 32 IU/L  NMR, LIPOPROFILE      Result Value Range   LDL Particle Number 519  <1000 nmol/L   LDLC SERPL CALC-MCNC 43  <100 mg/dL   HDL Cholesterol by NMR 80  >=40 mg/dL   Triglycerides by NMR 86  <150 mg/dL   Cholesterol 140  <200 mg/dL   HDL Particle Number 38.3  >=30.5 umol/L   Small LDL Particle Number < 90   <= 527 nmol/L   LDL Size 21.7  >20.5 nm   LP-IR Score < 25  <= 45  VITAMIN D 25 HYDROXY      Result Value Range   Vit D, 25-Hydroxy 32.7  30.0 - 100.0 ng/mL  POCT GLYCOSYLATED HEMOGLOBIN (HGB A1C)      Result Value Range   Hemoglobin A1C 6.1      ASSESSMENT:  HTN (hypertension) - Plan: CMP14+EGFR  HLD (hyperlipidemia) - Plan: CMP14+EGFR, NMR, lipoprofile  Chronic  kidney disease  Unspecified vitamin D deficiency  Osteoporosis, unspecified  Hyperglycemia - Plan: POCT glycosylated hemoglobin (Hb A1C)  PLAN: DASH DIET in the AVS Continue present regimen. Await labwork.  Orders Placed This Encounter  Procedures  . CMP14+EGFR  . NMR, lipoprofile  . POCT glycosylated hemoglobin (Hb A1C)   Meds ordered this encounter  Medications  . simvastatin (ZOCOR) 40 MG tablet    Sig: Take 20 mg by mouth every evening.   Medications Discontinued During This Encounter  Medication Reason  . simvastatin (ZOCOR) 40 MG tablet   . BENICAR HCT 40-25 MG per tablet Formulary change   Return in about 3 months (around 09/19/2013) for Recheck medical problems.  Hong Moring P. Jacelyn Grip, M.D.

## 2013-06-22 NOTE — Patient Instructions (Signed)
DASH Diet  The DASH diet stands for "Dietary Approaches to Stop Hypertension." It is a healthy eating plan that has been shown to reduce high blood pressure (hypertension) in as little as 14 days, while also possibly providing other significant health benefits. These other health benefits include reducing the risk of breast cancer after menopause and reducing the risk of type 2 diabetes, heart disease, colon cancer, and stroke. Health benefits also include weight loss and slowing kidney failure in patients with chronic kidney disease.   DIET GUIDELINES  · Limit salt (sodium). Your diet should contain less than 1500 mg of sodium daily.  · Limit refined or processed carbohydrates. Your diet should include mostly whole grains. Desserts and added sugars should be used sparingly.  · Include small amounts of heart-healthy fats. These types of fats include nuts, oils, and tub margarine. Limit saturated and trans fats. These fats have been shown to be harmful in the body.  CHOOSING FOODS   The following food groups are based on a 2000 calorie diet. See your Registered Dietitian for individual calorie needs.  Grains and Grain Products (6 to 8 servings daily)  · Eat More Often: Whole-wheat bread, brown rice, whole-grain or wheat pasta, quinoa, popcorn without added fat or salt (air popped).  · Eat Less Often: White bread, white pasta, white rice, cornbread.  Vegetables (4 to 5 servings daily)  · Eat More Often: Fresh, frozen, and canned vegetables. Vegetables may be raw, steamed, roasted, or grilled with a minimal amount of fat.  · Eat Less Often/Avoid: Creamed or fried vegetables. Vegetables in a cheese sauce.  Fruit (4 to 5 servings daily)  · Eat More Often: All fresh, canned (in natural juice), or frozen fruits. Dried fruits without added sugar. One hundred percent fruit juice (½ cup [237 mL] daily).  · Eat Less Often: Dried fruits with added sugar. Canned fruit in light or heavy syrup.  Lean Meats, Fish, and Poultry (2  servings or less daily. One serving is 3 to 4 oz [85-114 g]).  · Eat More Often: Ninety percent or leaner ground beef, tenderloin, sirloin. Round cuts of beef, chicken breast, turkey breast. All fish. Grill, bake, or broil your meat. Nothing should be fried.  · Eat Less Often/Avoid: Fatty cuts of meat, turkey, or chicken leg, thigh, or wing. Fried cuts of meat or fish.  Dairy (2 to 3 servings)  · Eat More Often: Low-fat or fat-free milk, low-fat plain or light yogurt, reduced-fat or part-skim cheese.  · Eat Less Often/Avoid: Milk (whole, 2%). Whole milk yogurt. Full-fat cheeses.  Nuts, Seeds, and Legumes (4 to 5 servings per week)  · Eat More Often: All without added salt.  · Eat Less Often/Avoid: Salted nuts and seeds, canned beans with added salt.  Fats and Sweets (limited)  · Eat More Often: Vegetable oils, tub margarines without trans fats, sugar-free gelatin. Mayonnaise and salad dressings.  · Eat Less Often/Avoid: Coconut oils, palm oils, butter, stick margarine, cream, half and half, cookies, candy, pie.  FOR MORE INFORMATION  The Dash Diet Eating Plan: www.dashdiet.org  Document Released: 04/23/2011 Document Revised: 07/27/2011 Document Reviewed: 04/23/2011  [employer]® Patient Information ©2014 [employer].

## 2013-06-23 LAB — CMP14+EGFR
ALT: 15 IU/L (ref 0–32)
AST: 28 IU/L (ref 0–40)
Albumin/Globulin Ratio: 1.5 (ref 1.1–2.5)
Albumin: 4.4 g/dL (ref 3.5–4.8)
Alkaline Phosphatase: 48 IU/L (ref 39–117)
BUN/Creatinine Ratio: 16 (ref 11–26)
BUN: 20 mg/dL (ref 8–27)
CO2: 25 mmol/L (ref 18–29)
Calcium: 10.2 mg/dL (ref 8.7–10.3)
Chloride: 100 mmol/L (ref 97–108)
Creatinine, Ser: 1.27 mg/dL — ABNORMAL HIGH (ref 0.57–1.00)
GFR calc Af Amer: 46 mL/min/{1.73_m2} — ABNORMAL LOW (ref >59)
GFR calc non Af Amer: 40 mL/min/{1.73_m2} — ABNORMAL LOW (ref >59)
Globulin, Total: 3 g/dL (ref 1.5–4.5)
Glucose: 100 mg/dL — ABNORMAL HIGH (ref 65–99)
Potassium: 4.5 mmol/L (ref 3.5–5.2)
Sodium: 143 mmol/L (ref 134–144)
Total Bilirubin: 0.8 mg/dL (ref 0.0–1.2)
Total Protein: 7.4 g/dL (ref 6.0–8.5)

## 2013-06-23 LAB — NMR, LIPOPROFILE
Cholesterol: 169 mg/dL (ref <200)
HDL Cholesterol by NMR: 66 mg/dL (ref >=40)
HDL Particle Number: 35.7 umol/L (ref >=30.5)
LDL Particle Number: 924 nmol/L (ref <1000)
LDL Size: 20.7 nm (ref >20.5)
LDLC SERPL CALC-MCNC: 76 mg/dL (ref <100)
LP-IR Score: 37 (ref <=45)
Small LDL Particle Number: 307 nmol/L (ref <=527)
Triglycerides by NMR: 133 mg/dL (ref <150)

## 2013-08-09 ENCOUNTER — Other Ambulatory Visit: Payer: Self-pay | Admitting: Family Medicine

## 2013-09-19 ENCOUNTER — Encounter: Payer: Self-pay | Admitting: Family Medicine

## 2013-09-19 ENCOUNTER — Ambulatory Visit (INDEPENDENT_AMBULATORY_CARE_PROVIDER_SITE_OTHER): Payer: Medicare Other | Admitting: Family Medicine

## 2013-09-19 VITALS — BP 126/68 | HR 61 | Temp 98.4°F | Ht 64.0 in | Wt 139.0 lb

## 2013-09-19 DIAGNOSIS — E119 Type 2 diabetes mellitus without complications: Secondary | ICD-10-CM

## 2013-09-19 DIAGNOSIS — I10 Essential (primary) hypertension: Secondary | ICD-10-CM

## 2013-09-19 DIAGNOSIS — E785 Hyperlipidemia, unspecified: Secondary | ICD-10-CM

## 2013-09-19 DIAGNOSIS — N189 Chronic kidney disease, unspecified: Secondary | ICD-10-CM | POA: Diagnosis not present

## 2013-09-19 DIAGNOSIS — E559 Vitamin D deficiency, unspecified: Secondary | ICD-10-CM | POA: Diagnosis not present

## 2013-09-19 LAB — POCT GLYCOSYLATED HEMOGLOBIN (HGB A1C): Hemoglobin A1C: 6.2

## 2013-09-19 NOTE — Progress Notes (Signed)
Patient ID: Sophia [name], female   DOB: Oct 10, 1931, 80 y.o.   MRN: [id number] SUBJECTIVE: CC: Chief Complaint  Patient presents with  . Hypertension    3 month recheck  . Hyperlipidemia    HPI: Patient is here for follow up of Diabetes Mellitus/HTN/HLD: Symptoms evaluated: Denies Nocturia ,Denies Urinary Frequency , denies Blurred vision ,deniesDizziness,denies.Dysuria,denies paresthesias, denies extremity pain or ulcers.Marland Kitchendenies chest pain. has had an annual eye exam. do check the feet. Does check CBGs. Average CBG:not checked Denies episodes of hypoglycemia. Does have an emergency hypoglycemic plan. admits toCompliance with medications. Denies Problems with medications. Feels and  Doing well. No problems.   Past Medical History  Diagnosis Date  . Hypertension   . Hyperlipidemia   . Diabetes mellitus without complication   . Chronic kidney disease    Past Surgical History  Procedure Laterality Date  . Cholecystectomy    . Sigmoidoscopy     History   Social History  . Marital Status: Married    Spouse Name: N/A    Number of Children: N/A  . Years of Education: N/A   Occupational History  . Not on file.   Social History Main Topics  . Smoking status: Never Smoker   . Smokeless tobacco: Never Used  . Alcohol Use: No  . Drug Use: No  . Sexual Activity: Not on file   Other Topics Concern  . Not on file   Social History Narrative  . No narrative on file   Family History  Problem Relation Age of Onset  . Heart disease Father   . Colon cancer Sister   . Heart disease Brother   . Heart disease Sister    Current Outpatient Prescriptions on File Prior to Visit  Medication Sig Dispense Refill  . aspirin 81 MG tablet Take 81 mg by mouth daily.      . Cholecalciferol (VITAMIN D-3 PO) Take by mouth. Take 1500 mg daily      . Multiple Vitamins-Minerals (MULTIVITAMIN WITH MINERALS) tablet Take 1 tablet by mouth daily.      . simvastatin (ZOCOR) 40 MG tablet  Take 20 mg by mouth every evening.      . valsartan-hydrochlorothiazide (DIOVAN-HCT) 320-25 MG per tablet TAKE ONE TABLET BY MOUTH ONCE DAILY  90 tablet  1   No current facility-administered medications on file prior to visit.   No Known Allergies Immunization History  Administered Date(s) Administered  . Influenza, High Dose Seasonal PF 03/13/2013  . Pneumococcal Polysaccharide-23 05/18/2002   Prior to Admission medications   Medication Sig Start Date End Date Taking? Authorizing Provider  aspirin 81 MG tablet Take 81 mg by mouth daily.   Yes Historical Provider, MD  Calcium-Magnesium 500-250 MG TABS Take by mouth daily.   Yes Historical Provider, MD  Cholecalciferol (VITAMIN D-3 PO) Take by mouth. Take 1500 mg daily   Yes Historical Provider, MD  Coenzyme Q10 (CO Q 10) 100 MG CAPS Take by mouth daily.   Yes Historical Provider, MD  Multiple Vitamins-Minerals (MULTIVITAMIN WITH MINERALS) tablet Take 1 tablet by mouth daily.   Yes Historical Provider, MD  simvastatin (ZOCOR) 40 MG tablet Take 20 mg by mouth every evening. 01/05/13  Yes Tammy Eckard, PHARMD  valsartan-hydrochlorothiazide (DIOVAN-HCT) 320-25 MG per tablet TAKE ONE TABLET BY MOUTH ONCE DAILY   Yes Vernie Shanks, MD     ROS: As above in the HPI. All other systems are stable or negative.  OBJECTIVE: APPEARANCE:  Patient in no acute distress.The patient  appeared well nourished and normally developed. Acyanotic. Waist: VITAL SIGNS:BP 126/68  Pulse 61  Temp(Src) 98.4 F (36.9 C) (Oral)  Ht _0  (1.626 m)  Wt 139 lb (63.05 kg)  BMI 23.85 kg/m2 WF looks well.  SKIN: warm and  Dry without overt rashes, tattoos and scars  HEAD and Neck: without JVD, Head and scalp: normal Eyes:No scleral icterus. Fundi normal, eye movements normal. Ears: Auricle normal, canal normal, Tympanic membranes normal, insufflation normal. Nose: normal Throat: normal Neck & thyroid: normal  CHEST & LUNGS: Chest wall: normal Lungs:  Clear  CVS: Reveals the PMI to be normally located. Regular rhythm, First and Second Heart sounds are normal,  absence of murmurs, rubs or gallops. Peripheral vasculature: Radial pulses: normal Dorsal pedis pulses: normal Posterior pulses: normal  ABDOMEN:  Appearance: normal Benign, no organomegaly, no masses, no Abdominal Aortic enlargement. No Guarding , no rebound. No Bruits. Bowel sounds: normal  RECTAL: N/A GU: N/A  EXTREMETIES: nonedematous.  MUSCULOSKELETAL:  Spine: normal Joints: intact  NEUROLOGIC: oriented to time,place and person; nonfocal. Strength is normal Sensory is normal Reflexes are normal Cranial Nerves are normal.  ASSESSMENT: HTN (hypertension) - Plan: CMP14+EGFR  HLD (hyperlipidemia) - Plan: NMR, lipoprofile  Diabetes mellitus without complication - Plan: POCT glycosylated hemoglobin (Hb A1C)  Chronic kidney disease  Unspecified vitamin D deficiency - Plan: Vit D  25 hydroxy (rtn osteoporosis monitoring)  PLAN: Dash diet in the AVS.   Orders Placed This Encounter  Procedures  . CMP14+EGFR  . NMR, lipoprofile  . Vit D  25 hydroxy (rtn osteoporosis monitoring)  . POCT glycosylated hemoglobin (Hb A1C)   Meds ordered this encounter  Medications  . Calcium-Magnesium 500-250 MG TABS    Sig: Take by mouth daily.  . Coenzyme Q10 (CO Q 10) 100 MG CAPS    Sig: Take by mouth daily.   Medications Discontinued During This Encounter  Medication Reason  . CA & PHOS-VIT D-MAG PO   . hydrocortisone cream 1 %   . neomycin-polymyxin b-dexamethasone (MAXITROL) 3.5-10000-0.1 OINT    Return in about 4 months (around 01/20/2014) for Recheck medical problems.  Verneal Wiers P. Jacelyn Grip, M.D.

## 2013-09-19 NOTE — Patient Instructions (Signed)
DASH Diet  The DASH diet stands for "Dietary Approaches to Stop Hypertension." It is a healthy eating plan that has been shown to reduce high blood pressure (hypertension) in as little as 14 days, while also possibly providing other significant health benefits. These other health benefits include reducing the risk of breast cancer after menopause and reducing the risk of type 2 diabetes, heart disease, colon cancer, and stroke. Health benefits also include weight loss and slowing kidney failure in patients with chronic kidney disease.   DIET GUIDELINES  · Limit salt (sodium). Your diet should contain less than 1500 mg of sodium daily.  · Limit refined or processed carbohydrates. Your diet should include mostly whole grains. Desserts and added sugars should be used sparingly.  · Include small amounts of heart-healthy fats. These types of fats include nuts, oils, and tub margarine. Limit saturated and trans fats. These fats have been shown to be harmful in the body.  CHOOSING FOODS   The following food groups are based on a 2000 calorie diet. See your Registered Dietitian for individual calorie needs.  Grains and Grain Products (6 to 8 servings daily)  · Eat More Often: Whole-wheat bread, brown rice, whole-grain or wheat pasta, quinoa, popcorn without added fat or salt (air popped).  · Eat Less Often: White bread, white pasta, white rice, cornbread.  Vegetables (4 to 5 servings daily)  · Eat More Often: Fresh, frozen, and canned vegetables. Vegetables may be raw, steamed, roasted, or grilled with a minimal amount of fat.  · Eat Less Often/Avoid: Creamed or fried vegetables. Vegetables in a cheese sauce.  Fruit (4 to 5 servings daily)  · Eat More Often: All fresh, canned (in natural juice), or frozen fruits. Dried fruits without added sugar. One hundred percent fruit juice (½ cup [237 mL] daily).  · Eat Less Often: Dried fruits with added sugar. Canned fruit in light or heavy syrup.  Lean Meats, Fish, and Poultry (2  servings or less daily. One serving is 3 to 4 oz [85-114 g]).  · Eat More Often: Ninety percent or leaner ground beef, tenderloin, sirloin. Round cuts of beef, chicken breast, turkey breast. All fish. Grill, bake, or broil your meat. Nothing should be fried.  · Eat Less Often/Avoid: Fatty cuts of meat, turkey, or chicken leg, thigh, or wing. Fried cuts of meat or fish.  Dairy (2 to 3 servings)  · Eat More Often: Low-fat or fat-free milk, low-fat plain or light yogurt, reduced-fat or part-skim cheese.  · Eat Less Often/Avoid: Milk (whole, 2%). Whole milk yogurt. Full-fat cheeses.  Nuts, Seeds, and Legumes (4 to 5 servings per week)  · Eat More Often: All without added salt.  · Eat Less Often/Avoid: Salted nuts and seeds, canned beans with added salt.  Fats and Sweets (limited)  · Eat More Often: Vegetable oils, tub margarines without trans fats, sugar-free gelatin. Mayonnaise and salad dressings.  · Eat Less Often/Avoid: Coconut oils, palm oils, butter, stick margarine, cream, half and half, cookies, candy, pie.  FOR MORE INFORMATION  The Dash Diet Eating Plan: www.dashdiet.org  Document Released: 04/23/2011 Document Revised: 07/27/2011 Document Reviewed: 04/23/2011  [employer]® Patient Information ©2014 [employer].

## 2013-09-20 LAB — CMP14+EGFR
ALT: 16 IU/L (ref 0–32)
AST: 25 IU/L (ref 0–40)
Albumin/Globulin Ratio: 1.5 (ref 1.1–2.5)
Albumin: 4.3 g/dL (ref 3.5–4.8)
Alkaline Phosphatase: 47 IU/L (ref 39–117)
BUN/Creatinine Ratio: 18 (ref 11–26)
BUN: 26 mg/dL (ref 8–27)
CO2: 25 mmol/L (ref 18–29)
Calcium: 10 mg/dL (ref 8.7–10.3)
Chloride: 103 mmol/L (ref 97–108)
Creatinine, Ser: 1.43 mg/dL — ABNORMAL HIGH (ref 0.57–1.00)
GFR calc Af Amer: 40 mL/min/{1.73_m2} — ABNORMAL LOW (ref >59)
GFR calc non Af Amer: 35 mL/min/{1.73_m2} — ABNORMAL LOW (ref >59)
Globulin, Total: 2.9 g/dL (ref 1.5–4.5)
Glucose: 90 mg/dL (ref 65–99)
Potassium: 4 mmol/L (ref 3.5–5.2)
Sodium: 144 mmol/L (ref 134–144)
Total Bilirubin: 0.8 mg/dL (ref 0.0–1.2)
Total Protein: 7.2 g/dL (ref 6.0–8.5)

## 2013-09-20 LAB — NMR, LIPOPROFILE
Cholesterol: 158 mg/dL (ref <200)
HDL Cholesterol by NMR: 66 mg/dL (ref >=40)
HDL Particle Number: 35.7 umol/L (ref >=30.5)
LDL Particle Number: 887 nmol/L (ref <1000)
LDL Size: 21 nm (ref >20.5)
LDLC SERPL CALC-MCNC: 72 mg/dL (ref <100)
LP-IR Score: 33 (ref <=45)
Small LDL Particle Number: 440 nmol/L (ref <=527)
Triglycerides by NMR: 99 mg/dL (ref <150)

## 2013-09-20 LAB — VITAMIN D 25 HYDROXY (VIT D DEFICIENCY, FRACTURES): Vit D, 25-Hydroxy: 32.7 ng/mL (ref 30.0–100.0)

## 2013-11-01 ENCOUNTER — Other Ambulatory Visit: Payer: Self-pay | Admitting: Family Medicine

## 2014-02-24 DIAGNOSIS — Z23 Encounter for immunization: Secondary | ICD-10-CM | POA: Diagnosis not present

## 2014-03-27 ENCOUNTER — Other Ambulatory Visit: Payer: Self-pay | Admitting: *Deleted

## 2014-03-27 MED ORDER — VALSARTAN-HYDROCHLOROTHIAZIDE 320-25 MG PO TABS: ORAL_TABLET | ORAL | Status: DC

## 2014-03-27 NOTE — Telephone Encounter (Signed)
Last ov 09/19/13. Pt of Dr. Jacelyn Grip. ntbs

## 2014-09-17 ENCOUNTER — Other Ambulatory Visit (INDEPENDENT_AMBULATORY_CARE_PROVIDER_SITE_OTHER): Payer: Medicare Other

## 2014-09-17 DIAGNOSIS — E785 Hyperlipidemia, unspecified: Secondary | ICD-10-CM | POA: Diagnosis not present

## 2014-09-17 DIAGNOSIS — I10 Essential (primary) hypertension: Secondary | ICD-10-CM | POA: Diagnosis not present

## 2014-09-17 DIAGNOSIS — N182 Chronic kidney disease, stage 2 (mild): Secondary | ICD-10-CM

## 2014-09-17 DIAGNOSIS — Z780 Asymptomatic menopausal state: Secondary | ICD-10-CM

## 2014-09-17 DIAGNOSIS — R5383 Other fatigue: Secondary | ICD-10-CM | POA: Diagnosis not present

## 2014-09-17 DIAGNOSIS — E559 Vitamin D deficiency, unspecified: Secondary | ICD-10-CM | POA: Diagnosis not present

## 2014-09-17 DIAGNOSIS — R739 Hyperglycemia, unspecified: Secondary | ICD-10-CM | POA: Diagnosis not present

## 2014-09-17 LAB — POCT CBC
Granulocyte percent: 64.7 %G (ref 37–80)
HCT, POC: 41.5 % (ref 37.7–47.9)
Hemoglobin: 13 g/dL (ref 12.2–16.2)
Lymph, poc: 1.5 (ref 0.6–3.4)
MCH, POC: 30.6 pg (ref 27–31.2)
MCHC: 31.4 g/dL — AB (ref 31.8–35.4)
MCV: 97.6 fL — AB (ref 80–97)
MPV: 8 fL (ref 0–99.8)
POC Granulocyte: 3.4 (ref 2–6.9)
POC LYMPH PERCENT: 28.9 %L (ref 10–50)
Platelet Count, POC: 298 10*3/uL (ref 142–424)
RBC: 4.26 M/uL (ref 4.04–5.48)
RDW, POC: 13.1 %
WBC: 5.3 10*3/uL (ref 4.6–10.2)

## 2014-09-17 NOTE — Addendum Note (Signed)
Addended by: Earlene Plater on: 09/17/2014 12:17 PM   Modules accepted: Orders

## 2014-09-17 NOTE — Progress Notes (Signed)
Lab only 

## 2014-09-18 LAB — CMP14+EGFR
ALT: 21 IU/L (ref 0–32)
AST: 28 IU/L (ref 0–40)
Albumin/Globulin Ratio: 1.4 (ref 1.1–2.5)
Albumin: 4.4 g/dL (ref 3.5–4.7)
Alkaline Phosphatase: 62 IU/L (ref 39–117)
BUN/Creatinine Ratio: 12 (ref 11–26)
BUN: 17 mg/dL (ref 8–27)
Bilirubin Total: 0.7 mg/dL (ref 0.0–1.2)
CO2: 23 mmol/L (ref 18–29)
Calcium: 9.8 mg/dL (ref 8.7–10.3)
Chloride: 104 mmol/L (ref 97–108)
Creatinine, Ser: 1.46 mg/dL — ABNORMAL HIGH (ref 0.57–1.00)
GFR calc Af Amer: 39 mL/min/{1.73_m2} — ABNORMAL LOW (ref >59)
GFR calc non Af Amer: 34 mL/min/{1.73_m2} — ABNORMAL LOW (ref >59)
Globulin, Total: 3.1 g/dL (ref 1.5–4.5)
Glucose: 99 mg/dL (ref 65–99)
Potassium: 4.2 mmol/L (ref 3.5–5.2)
Sodium: 145 mmol/L — ABNORMAL HIGH (ref 134–144)
Total Protein: 7.5 g/dL (ref 6.0–8.5)

## 2014-09-18 LAB — LIPID PANEL
Chol/HDL Ratio: 4.3 ratio units (ref 0.0–4.4)
Cholesterol, Total: 247 mg/dL — ABNORMAL HIGH (ref 100–199)
HDL: 58 mg/dL (ref >39)
LDL Calculated: 156 mg/dL — ABNORMAL HIGH (ref 0–99)
Triglycerides: 167 mg/dL — ABNORMAL HIGH (ref 0–149)
VLDL Cholesterol Cal: 33 mg/dL (ref 5–40)

## 2014-09-18 LAB — VITAMIN D 25 HYDROXY (VIT D DEFICIENCY, FRACTURES): Vit D, 25-Hydroxy: 29.5 ng/mL — ABNORMAL LOW (ref 30.0–100.0)

## 2014-09-18 LAB — THYROID PANEL WITH TSH
Free Thyroxine Index: 1 — ABNORMAL LOW (ref 1.2–4.9)
T3 Uptake Ratio: 24 % (ref 24–39)
T4, Total: 4.3 ug/dL — ABNORMAL LOW (ref 4.5–12.0)
TSH: 15.64 u[IU]/mL — ABNORMAL HIGH (ref 0.450–4.500)

## 2014-09-21 ENCOUNTER — Encounter: Payer: Self-pay | Admitting: Family Medicine

## 2014-09-21 ENCOUNTER — Ambulatory Visit (INDEPENDENT_AMBULATORY_CARE_PROVIDER_SITE_OTHER): Payer: Medicare Other | Admitting: Family Medicine

## 2014-09-21 VITALS — BP 162/71 | HR 64 | Temp 97.5°F | Ht 64.0 in | Wt 135.0 lb

## 2014-09-21 DIAGNOSIS — R739 Hyperglycemia, unspecified: Secondary | ICD-10-CM | POA: Diagnosis not present

## 2014-09-21 DIAGNOSIS — I10 Essential (primary) hypertension: Secondary | ICD-10-CM

## 2014-09-21 DIAGNOSIS — E038 Other specified hypothyroidism: Secondary | ICD-10-CM | POA: Diagnosis not present

## 2014-09-21 DIAGNOSIS — E559 Vitamin D deficiency, unspecified: Secondary | ICD-10-CM | POA: Diagnosis not present

## 2014-09-21 DIAGNOSIS — E785 Hyperlipidemia, unspecified: Secondary | ICD-10-CM

## 2014-09-21 LAB — POCT GLYCOSYLATED HEMOGLOBIN (HGB A1C): Hemoglobin A1C: 6.2

## 2014-09-21 MED ORDER — SIMVASTATIN 40 MG PO TABS: 20.0000 mg | ORAL_TABLET | Freq: Every day | ORAL | Status: DC

## 2014-09-21 MED ORDER — LEVOTHYROXINE SODIUM 25 MCG PO TABS: 25.0000 ug | ORAL_TABLET | Freq: Every day | ORAL | Status: DC

## 2014-09-21 MED ORDER — VITAMIN D (ERGOCALCIFEROL) 1.25 MG (50000 UNIT) PO CAPS: 50000.0000 [IU] | ORAL_CAPSULE | ORAL | Status: DC

## 2014-09-21 NOTE — Addendum Note (Signed)
Addended by: Claretta Fraise on: 09/21/2014 04:52 PM   Modules accepted: Miquel Dunn

## 2014-09-21 NOTE — Addendum Note (Signed)
Addended by: Marin Olp on: 09/21/2014 04:55 PM   Modules accepted: Orders, SmartSet

## 2014-09-21 NOTE — Progress Notes (Signed)
Subjective:  Patient ID: Sophia [name], female    DOB: 12-15-1931  Age: 81 y.o. MRN: [id number]  CC: Hypertension and Hypothyroidism   HPI Zailey Audia presents for  follow-up of hypertension. Patient has no history of headache chest pain or shortness of breath or recent cough. Patient also denies symptoms of TIA such as numbness weakness lateralizing. Patient checks  blood pressure at home and has not had any elevated readings recently. Patient denies side effects from his medication. States taking it regularly.   History Muntaha has a past medical history of Hypertension; Hyperlipidemia; Diabetes mellitus without complication; and Chronic kidney disease.   She has past surgical history that includes Cholecystectomy and Sigmoidoscopy.   Her family history includes Colon cancer in her sister; Heart disease in her brother, father, and sister.She reports that she has never smoked. She has never used smokeless tobacco. She reports that she does not drink alcohol or use illicit drugs.  Current Outpatient Prescriptions on File Prior to Visit  Medication Sig Dispense Refill  . aspirin 81 MG tablet Take 81 mg by mouth daily.    . Calcium-Magnesium 500-250 MG TABS Take by mouth daily.    . Cholecalciferol (VITAMIN D-3 PO) Take by mouth. Take 1500 mg daily    . Coenzyme Q10 (CO Q 10) 100 MG CAPS Take by mouth daily.    . Multiple Vitamins-Minerals (MULTIVITAMIN WITH MINERALS) tablet Take 1 tablet by mouth daily.    . simvastatin (ZOCOR) 40 MG tablet TAKE ONE TABLET BY MOUTH ONCE DAILY IN  THE  EVENING (Patient taking differently: take 1/2 tablet daily) 90 tablet 1  . valsartan-hydrochlorothiazide (DIOVAN-HCT) 320-25 MG per tablet TAKE ONE TABLET BY MOUTH ONCE DAILY (Patient taking differently: Take 1 tablet by mouth daily. TAKE ONE TABLET BY MOUTH ONCE DAILY) 90 tablet 0   No current facility-administered medications on file prior to visit.    ROS Review of Systems  Objective:    BP 162/71 mmHg  Pulse 64  Temp(Src) 97.5 F (36.4 C) (Oral)  Ht 5\' 4"  (1.626 m)  Wt 135 lb (61.236 kg)  BMI 23.16 kg/m2  BP Readings from Last 3 Encounters:  09/21/14 162/71  09/19/13 126/68  06/22/13 150/73    Wt Readings from Last 3 Encounters:  09/21/14 135 lb (61.236 kg)  09/19/13 139 lb (63.05 kg)  06/22/13 137 lb 12.8 oz (62.506 kg)     Physical Exam  Lab Results  Component Value Date   HGBA1C 6.2% 09/19/2013   HGBA1C 6.2% 06/22/2013   HGBA1C 6.1 01/31/2013    Lab Results  Component Value Date   WBC 5.3 09/17/2014   HGB 13.0 09/17/2014   HCT 41.5 09/17/2014   GLUCOSE 99 09/17/2014   CHOL 247* 09/17/2014   TRIG 167* 09/17/2014   HDL 58 09/17/2014   LDLCALC 156* 09/17/2014   ALT 21 09/17/2014   AST 28 09/17/2014   NA 145* 09/17/2014   K 4.2 09/17/2014   CL 104 09/17/2014   CREATININE 1.46* 09/17/2014   BUN 17 09/17/2014   CO2 23 09/17/2014   TSH 15.640* 09/17/2014   HGBA1C 6.2% 09/19/2013    Patient was never admitted.  Assessment & Plan:   Mishell was seen today for hypertension and hypothyroidism.  Diagnoses and all orders for this visit:  Hyperlipidemia Orders: -     Cancel: Lipid panel   I am having Ms. Diego maintain her aspirin, multivitamin with minerals, Cholecalciferol (VITAMIN D-3 PO), Calcium-Magnesium, Co Q 10, simvastatin, valsartan-hydrochlorothiazide, and  Omega 3.  Meds ordered this encounter  Medications  . Omega 3 1200 MG CAPS    Sig: Take 1 capsule by mouth daily.     Follow-up: No Follow-up on file.  Claretta Fraise, M.D.

## 2014-10-19 ENCOUNTER — Other Ambulatory Visit: Payer: Self-pay | Admitting: *Deleted

## 2014-10-19 MED ORDER — VALSARTAN-HYDROCHLOROTHIAZIDE 320-25 MG PO TABS: ORAL_TABLET | ORAL | Status: DC

## 2014-11-12 ENCOUNTER — Ambulatory Visit: Payer: Medicare Other | Admitting: Family Medicine

## 2014-11-16 ENCOUNTER — Other Ambulatory Visit: Payer: Self-pay | Admitting: Family Medicine

## 2014-11-26 ENCOUNTER — Other Ambulatory Visit (INDEPENDENT_AMBULATORY_CARE_PROVIDER_SITE_OTHER): Payer: Medicare Other

## 2014-11-26 DIAGNOSIS — E785 Hyperlipidemia, unspecified: Secondary | ICD-10-CM

## 2014-11-26 DIAGNOSIS — I10 Essential (primary) hypertension: Secondary | ICD-10-CM | POA: Diagnosis not present

## 2014-11-26 DIAGNOSIS — R739 Hyperglycemia, unspecified: Secondary | ICD-10-CM

## 2014-11-26 DIAGNOSIS — N182 Chronic kidney disease, stage 2 (mild): Secondary | ICD-10-CM | POA: Diagnosis not present

## 2014-11-26 LAB — POCT CBC
Granulocyte percent: 64.3 %G (ref 37–80)
HCT, POC: 35.5 % — AB (ref 37.7–47.9)
Hemoglobin: 11.2 g/dL — AB (ref 12.2–16.2)
Lymph, poc: 1.9 (ref 0.6–3.4)
MCH, POC: 29.8 pg (ref 27–31.2)
MCHC: 31.6 g/dL — AB (ref 31.8–35.4)
MCV: 94.4 fL (ref 80–97)
MPV: 7.6 fL (ref 0–99.8)
POC Granulocyte: 4.2 (ref 2–6.9)
POC LYMPH PERCENT: 28.9 %L (ref 10–50)
Platelet Count, POC: 295 10*3/uL (ref 142–424)
RBC: 3.76 M/uL — AB (ref 4.04–5.48)
RDW, POC: 12.7 %
WBC: 6.5 10*3/uL (ref 4.6–10.2)

## 2014-11-26 NOTE — Progress Notes (Signed)
Lab only 

## 2014-11-27 LAB — LIPID PANEL
Chol/HDL Ratio: 2.7 ratio units (ref 0.0–4.4)
Cholesterol, Total: 156 mg/dL (ref 100–199)
HDL: 57 mg/dL (ref >39)
LDL Calculated: 73 mg/dL (ref 0–99)
Triglycerides: 129 mg/dL (ref 0–149)
VLDL Cholesterol Cal: 26 mg/dL (ref 5–40)

## 2014-11-27 LAB — CMP14+EGFR
ALT: 18 IU/L (ref 0–32)
AST: 25 IU/L (ref 0–40)
Albumin/Globulin Ratio: 1.5 (ref 1.1–2.5)
Albumin: 3.9 g/dL (ref 3.5–4.7)
Alkaline Phosphatase: 49 IU/L (ref 39–117)
BUN/Creatinine Ratio: 19 (ref 11–26)
BUN: 26 mg/dL (ref 8–27)
Bilirubin Total: 0.7 mg/dL (ref 0.0–1.2)
CO2: 20 mmol/L (ref 18–29)
Calcium: 9.3 mg/dL (ref 8.7–10.3)
Chloride: 104 mmol/L (ref 97–108)
Creatinine, Ser: 1.34 mg/dL — ABNORMAL HIGH (ref 0.57–1.00)
GFR calc Af Amer: 43 mL/min/{1.73_m2} — ABNORMAL LOW (ref >59)
GFR calc non Af Amer: 37 mL/min/{1.73_m2} — ABNORMAL LOW (ref >59)
Globulin, Total: 2.6 g/dL (ref 1.5–4.5)
Glucose: 98 mg/dL (ref 65–99)
Potassium: 4.2 mmol/L (ref 3.5–5.2)
Sodium: 139 mmol/L (ref 134–144)
Total Protein: 6.5 g/dL (ref 6.0–8.5)

## 2014-12-03 ENCOUNTER — Ambulatory Visit (INDEPENDENT_AMBULATORY_CARE_PROVIDER_SITE_OTHER): Payer: Medicare Other | Admitting: Family Medicine

## 2014-12-03 ENCOUNTER — Encounter: Payer: Self-pay | Admitting: Family Medicine

## 2014-12-03 VITALS — BP 141/75 | HR 63 | Temp 97.3°F | Ht 64.0 in | Wt 135.6 lb

## 2014-12-03 DIAGNOSIS — R7303 Prediabetes: Secondary | ICD-10-CM

## 2014-12-03 DIAGNOSIS — M19041 Primary osteoarthritis, right hand: Secondary | ICD-10-CM | POA: Diagnosis not present

## 2014-12-03 DIAGNOSIS — R7309 Other abnormal glucose: Secondary | ICD-10-CM | POA: Diagnosis not present

## 2014-12-03 DIAGNOSIS — I10 Essential (primary) hypertension: Secondary | ICD-10-CM

## 2014-12-03 DIAGNOSIS — E038 Other specified hypothyroidism: Secondary | ICD-10-CM

## 2014-12-03 DIAGNOSIS — M19042 Primary osteoarthritis, left hand: Secondary | ICD-10-CM | POA: Diagnosis not present

## 2014-12-03 DIAGNOSIS — M79646 Pain in unspecified finger(s): Secondary | ICD-10-CM | POA: Diagnosis not present

## 2014-12-03 DIAGNOSIS — M25549 Pain in joints of unspecified hand: Secondary | ICD-10-CM

## 2014-12-03 DIAGNOSIS — E785 Hyperlipidemia, unspecified: Secondary | ICD-10-CM | POA: Diagnosis not present

## 2014-12-03 LAB — POCT GLYCOSYLATED HEMOGLOBIN (HGB A1C): Hemoglobin A1C: 6.6

## 2014-12-03 NOTE — Progress Notes (Signed)
Subjective:  Patient ID: Sophia [name], female    DOB: 1932-03-13  Age: 81 y.o. MRN: [id number]  CC: Hypertension; Hyperlipidemia; and Hypothyroidism   HPI Sophia [name] presents for  follow-up of hypertension. Patient has no history of headache chest pain or shortness of breath or recent cough. Patient also denies symptoms of TIA such as numbness weakness lateralizing. Patient checks  blood pressure at home and has not had any elevated readings recently. Patient denies side effects from his medication. States taking it regularly.  Patient also  in for follow-up of elevated cholesterol. Doing well without complaints on current medication. Denies side effects of statin including myalgia and arthralgia and nausea. Also in today for liver function testing. Currently no chest pain, shortness of breath or other cardiovascular related symptoms noted.  Patient presents for follow-up on  thyroid. She has a history of hypothyroidism for many years. It has been stable recently. Pt. denies any change in  voice, loss of hair, heat or cold intolerance. Energy level has been adequate to good. She denies constipation and diarrhea. No myxedema. Medication is as noted below. Verified that pt is taking it daily on an empty stomach. Well tolerated.   Patient has been found to be prediabetic in the recent past. She follows a low-carb diet and principal with several variations. She is cutting back on sweets and bread. She denies low blood sugar episodes. No polydipsia and no polyuria and no nausea. She does not check home glucose  History Sophia [name] has a past medical history of Hypertension; Hyperlipidemia; Diabetes mellitus without complication; and Chronic kidney disease.   She has past surgical history that includes Cholecystectomy and Sigmoidoscopy.   Her family history includes Colon cancer in her sister; Heart disease in her brother, father, and sister.She reports that she has never smoked. She has  never used smokeless tobacco. She reports that she does not drink alcohol or use illicit drugs.  Current Outpatient Prescriptions on File Prior to Visit  Medication Sig Dispense Refill  . aspirin 81 MG tablet Take 81 mg by mouth daily.    . Calcium-Magnesium 500-250 MG TABS Take by mouth daily.    . Cholecalciferol (VITAMIN D-3 PO) Take by mouth. Take 1500 mg daily    . Coenzyme Q10 (CO Q 10) 100 MG CAPS Take by mouth daily.    Marland Kitchen levothyroxine (SYNTHROID, LEVOTHROID) 25 MCG tablet TAKE ONE TABLET BY MOUTH ONCE DAILY BEFORE BREAKFAST 30 tablet 10  . Multiple Vitamins-Minerals (MULTIVITAMIN WITH MINERALS) tablet Take 1 tablet by mouth daily.    . Omega 3 1200 MG CAPS Take 1 capsule by mouth daily.    . simvastatin (ZOCOR) 40 MG tablet Take 0.5 tablets (20 mg total) by mouth daily. 90 tablet 1  . valsartan-hydrochlorothiazide (DIOVAN-HCT) 320-25 MG per tablet TAKE ONE TABLET BY MOUTH ONCE DAILY 90 tablet 0  . Vitamin D, Ergocalciferol, (DRISDOL) 50000 UNITS CAPS capsule Take 1 capsule (50,000 Units total) by mouth 2 (two) times a week. 16 capsule 0   No current facility-administered medications on file prior to visit.    ROS Review of Systems  Constitutional: Negative for fever, chills, diaphoresis, appetite change, fatigue and unexpected weight change.  HENT: Negative for congestion, ear pain, hearing loss, postnasal drip, rhinorrhea, sneezing, sore throat and trouble swallowing.   Eyes: Negative for pain.  Respiratory: Negative for cough, chest tightness and shortness of breath.   Cardiovascular: Negative for chest pain and palpitations.  Gastrointestinal: Negative for nausea, vomiting, abdominal pain, diarrhea  and constipation.  Genitourinary: Negative for dysuria, frequency and menstrual problem.  Musculoskeletal: Positive for arthralgias (Fingers have been swelling at the distal interphalangeal joint. Primarily DIP 2 through 4 bilaterally). Negative for joint swelling.  Skin: Negative  for rash.  Neurological: Negative for dizziness, weakness, numbness and headaches.  Psychiatric/Behavioral: Negative for dysphoric mood and agitation.    Objective:  BP 141/75 mmHg  Pulse 63  Temp(Src) 97.3 F (36.3 C) (Oral)  Ht 5\' 4"  (1.626 m)  Wt 135 lb 9.6 oz (61.508 kg)  BMI 23.26 kg/m2  BP Readings from Last 3 Encounters:  12/03/14 141/75  09/21/14 162/71  09/19/13 126/68    Wt Readings from Last 3 Encounters:  12/03/14 135 lb 9.6 oz (61.508 kg)  09/21/14 135 lb (61.236 kg)  09/19/13 139 lb (63.05 kg)     Physical Exam  Constitutional: She is oriented to person, place, and time. She appears well-developed and well-nourished. No distress.  HENT:  Head: Normocephalic and atraumatic.  Right Ear: External ear normal.  Left Ear: External ear normal.  Nose: Nose normal.  Mouth/Throat: Oropharynx is clear and moist.  Eyes: Conjunctivae and EOM are normal. Pupils are equal, round, and reactive to light.  Neck: Normal range of motion. Neck supple. No thyromegaly present.  Cardiovascular: Normal rate, regular rhythm and normal heart sounds.   No murmur heard. Pulmonary/Chest: Effort normal and breath sounds normal. No respiratory distress. She has no wheezes. She has no rales.  Abdominal: Soft. Bowel sounds are normal. She exhibits no distension. There is no tenderness.  Musculoskeletal: She exhibits tenderness (Heberden's nodes at each of the fingers).  Lymphadenopathy:    She has no cervical adenopathy.  Neurological: She is alert and oriented to person, place, and time. She has normal reflexes.  Skin: Skin is warm and dry.  Psychiatric: She has a normal mood and affect. Her behavior is normal. Judgment and thought content normal.    Lab Results  Component Value Date   HGBA1C 6.6 12/03/2014   HGBA1C 6.2 09/21/2014   HGBA1C 6.2% 09/19/2013    Lab Results  Component Value Date   WBC 6.5 11/26/2014   HGB 11.2* 11/26/2014   HCT 35.5* 11/26/2014   GLUCOSE 98  11/26/2014   CHOL 156 11/26/2014   TRIG 129 11/26/2014   HDL 57 11/26/2014   LDLCALC 73 11/26/2014   ALT 18 11/26/2014   AST 25 11/26/2014   NA 139 11/26/2014   K 4.2 11/26/2014   CL 104 11/26/2014   CREATININE 1.34* 11/26/2014   BUN 26 11/26/2014   CO2 20 11/26/2014   TSH 3.110 12/03/2014   HGBA1C 6.6 12/03/2014    Patient was never admitted.  Assessment & Plan:   Sophia [name] was seen today for hypertension, hyperlipidemia and hypothyroidism.  Diagnoses and all orders for this visit:  Prediabetes Orders: -     POCT glycosylated hemoglobin (Hb A1C)  Other specified hypothyroidism Orders: -     TSH -     T4, free -     Rheumatoid factor -     POCT glycosylated hemoglobin (Hb A1C)  Pain in multiple finger joints Orders: -     Rheumatoid factor  Primary osteoarthritis of both hands  Essential hypertension  HLD (hyperlipidemia)   I am having Ms. Yeats maintain her aspirin, multivitamin with minerals, Cholecalciferol (VITAMIN D-3 PO), Calcium-Magnesium, Co Q 10, Omega 3, Vitamin D (Ergocalciferol), simvastatin, valsartan-hydrochlorothiazide, and levothyroxine.  No orders of the defined types were placed in this encounter.  Follow-up: Return in about 3 months (around 03/05/2015).  Claretta Fraise, M.D.

## 2014-12-04 LAB — T4, FREE: Free T4: 1.15 ng/dL (ref 0.82–1.77)

## 2014-12-04 LAB — TSH: TSH: 3.11 u[IU]/mL (ref 0.450–4.500)

## 2014-12-04 LAB — RHEUMATOID FACTOR: Rhuematoid fact SerPl-aCnc: 9.5 IU/mL (ref 0.0–13.9)

## 2015-02-14 DIAGNOSIS — H00025 Hordeolum internum left lower eyelid: Secondary | ICD-10-CM | POA: Diagnosis not present

## 2015-02-25 ENCOUNTER — Other Ambulatory Visit: Payer: Self-pay | Admitting: Family Medicine

## 2015-02-28 DIAGNOSIS — H00025 Hordeolum internum left lower eyelid: Secondary | ICD-10-CM | POA: Diagnosis not present

## 2015-03-08 DIAGNOSIS — H04123 Dry eye syndrome of bilateral lacrimal glands: Secondary | ICD-10-CM | POA: Diagnosis not present

## 2015-03-18 ENCOUNTER — Ambulatory Visit (INDEPENDENT_AMBULATORY_CARE_PROVIDER_SITE_OTHER): Payer: Medicare Other | Admitting: *Deleted

## 2015-03-18 VITALS — BP 138/66 | HR 69

## 2015-03-18 DIAGNOSIS — Z23 Encounter for immunization: Secondary | ICD-10-CM

## 2015-03-18 DIAGNOSIS — I10 Essential (primary) hypertension: Secondary | ICD-10-CM

## 2015-03-18 NOTE — Progress Notes (Signed)
Pt here today for BP check. 

## 2015-04-09 DIAGNOSIS — H00025 Hordeolum internum left lower eyelid: Secondary | ICD-10-CM | POA: Diagnosis not present

## 2015-04-10 ENCOUNTER — Telehealth: Payer: Self-pay | Admitting: Family Medicine

## 2015-04-10 NOTE — Addendum Note (Signed)
Addended by: Louann Sjogren A on: 04/10/2015 03:34 PM   Modules accepted: Orders

## 2015-04-10 NOTE — Telephone Encounter (Signed)
Patient states she had flu shot when she was here for her bp check.

## 2015-04-26 ENCOUNTER — Encounter: Payer: Self-pay | Admitting: Family Medicine

## 2015-04-26 ENCOUNTER — Ambulatory Visit (INDEPENDENT_AMBULATORY_CARE_PROVIDER_SITE_OTHER): Payer: Medicare Other | Admitting: Family Medicine

## 2015-04-26 VITALS — BP 151/78 | HR 86 | Temp 97.7°F | Ht 64.0 in | Wt 128.4 lb

## 2015-04-26 DIAGNOSIS — R1030 Lower abdominal pain, unspecified: Secondary | ICD-10-CM | POA: Diagnosis not present

## 2015-04-26 DIAGNOSIS — K589 Irritable bowel syndrome without diarrhea: Secondary | ICD-10-CM

## 2015-04-26 LAB — POCT UA - MICROSCOPIC ONLY
Bacteria, U Microscopic: NEGATIVE
Casts, Ur, LPF, POC: NEGATIVE
Crystals, Ur, HPF, POC: NEGATIVE
Mucus, UA: NEGATIVE
RBC, urine, microscopic: NEGATIVE
Yeast, UA: NEGATIVE

## 2015-04-26 LAB — POCT URINALYSIS DIPSTICK
Bilirubin, UA: NEGATIVE
Blood, UA: NEGATIVE
Glucose, UA: NEGATIVE
Ketones, UA: NEGATIVE
Nitrite, UA: NEGATIVE
Protein, UA: NEGATIVE
Spec Grav, UA: 1.01
Urobilinogen, UA: NEGATIVE
pH, UA: 7.5

## 2015-04-26 MED ORDER — HYOSCYAMINE SULFATE 0.125 MG SL SUBL
0.1250 mg | SUBLINGUAL_TABLET | SUBLINGUAL | Status: DC | PRN
Start: 2015-04-26 — End: 2015-06-04

## 2015-04-26 MED ORDER — OMEPRAZOLE 20 MG PO CPDR: 20.0000 mg | DELAYED_RELEASE_CAPSULE | Freq: Every day | ORAL | Status: DC

## 2015-04-26 NOTE — Progress Notes (Signed)
Subjective:  Patient ID: Sophia [name], female    DOB: 12-Jul-1931  Age: 81 y.o. MRN: [id number]  CC: Abdominal Pain   HPI Sophia [name] presents for nausea & feeling like  thre is a knot in her stomah. She points to the  Hypogastric region of the stomach just below the umbilicus. She says she doesn't feel an actual knot. it just feels like there is one there. She denies constipation and diarrhea. She is having some gnawing sensation as well. She went to the hospital with her husband several days ago. While there she felt nauseous. She was given some crackers and that gave her some relief. Under a great deal of stress due to husband having health issues. History Sophia [name] has a past medical history of Hypertension; Hyperlipidemia; Diabetes mellitus without complication ([place]); and Chronic kidney disease.   She has past surgical history that includes Cholecystectomy and Sigmoidoscopy.   Her family history includes Colon cancer in her sister; Heart disease in her brother, father, and sister.She reports that she has never smoked. She has never used smokeless tobacco. She reports that she does not drink alcohol or use illicit drugs.  Current Outpatient Prescriptions on File Prior to Visit  Medication Sig Dispense Refill  . aspirin 81 MG tablet Take 81 mg by mouth daily.    . Calcium-Magnesium 500-250 MG TABS Take by mouth daily.    . Cholecalciferol (VITAMIN D-3 PO) Take by mouth. Take 1500 mg daily    . Coenzyme Q10 (CO Q 10) 100 MG CAPS Take by mouth daily.    Marland Kitchen levothyroxine (SYNTHROID, LEVOTHROID) 25 MCG tablet TAKE ONE TABLET BY MOUTH ONCE DAILY BEFORE BREAKFAST 30 tablet 10  . Multiple Vitamins-Minerals (MULTIVITAMIN WITH MINERALS) tablet Take 1 tablet by mouth daily.    . Omega 3 1200 MG CAPS Take 1 capsule by mouth daily.    . simvastatin (ZOCOR) 40 MG tablet Take 0.5 tablets (20 mg total) by mouth daily. 90 tablet 1  . valsartan-hydrochlorothiazide (DIOVAN-HCT) 320-25 MG tablet  TAKE ONE TABLET BY MOUTH ONCE DAILY 90 tablet 0   No current facility-administered medications on file prior to visit.    ROS Review of Systems  Constitutional: Negative for fever, activity change and appetite change.  HENT: Negative for congestion, rhinorrhea and sore throat.   Eyes: Negative for visual disturbance.  Respiratory: Negative for cough and shortness of breath.   Cardiovascular: Negative for chest pain and palpitations.  Gastrointestinal: Positive for abdominal pain and abdominal distention. Negative for nausea and diarrhea.  Genitourinary: Negative for dysuria.  Musculoskeletal: Negative for myalgias and arthralgias.    Objective:  BP 151/78 mmHg  Pulse 86  Temp(Src) 97.7 F (36.5 C) (Oral)  Ht '5\' 4"'  (1.626 m)  Wt 128 lb 6.4 oz (58.242 kg)  BMI 22.03 kg/m2  SpO2 100%  BP Readings from Last 3 Encounters:  04/26/15 151/78  03/18/15 138/66  12/03/14 141/75    Wt Readings from Last 3 Encounters:  04/26/15 128 lb 6.4 oz (58.242 kg)  12/03/14 135 lb 9.6 oz (61.508 kg)  09/21/14 135 lb (61.236 kg)     Physical Exam  Lab Results  Component Value Date   HGBA1C 6.6 12/03/2014   HGBA1C 6.2 09/21/2014   HGBA1C 6.2% 09/19/2013    Lab Results  Component Value Date   WBC 10.2 04/26/2015   HGB 11.2* 11/26/2014   HCT 32.9* 04/26/2015   GLUCOSE 150* 04/26/2015   CHOL 156 11/26/2014   TRIG 129 11/26/2014  HDL 57 11/26/2014   LDLCALC 73 11/26/2014   ALT 12 04/26/2015   AST 20 04/26/2015   NA 142 04/26/2015   K 4.2 04/26/2015   CL 101 04/26/2015   CREATININE 1.35* 04/26/2015   BUN 29* 04/26/2015   CO2 24 04/26/2015   TSH 3.110 12/03/2014   HGBA1C 6.6 12/03/2014    Patient was never admitted.  Assessment & Plan:   Syona was seen today for abdominal pain.  Diagnoses and all orders for this visit:  Lower abdominal pain -     POCT urinalysis dipstick -     POCT UA - Microscopic Only -     Amylase -     CBC with Differential/Platelet -      CMP14+EGFR -     Lipase  Colon spasm  Other orders -     hyoscyamine (LEVSIN/SL) 0.125 MG SL tablet; Place 1 tablet (0.125 mg total) under the tongue every 4 (four) hours as needed. For colon spasm -     omeprazole (PRILOSEC) 20 MG capsule; Take 1 capsule (20 mg total) by mouth daily. Take on an empty stomach   I have discontinued Ms. Armond's Vitamin D (Ergocalciferol). I am also having her start on hyoscyamine and omeprazole. Additionally, I am having her maintain her aspirin, multivitamin with minerals, Cholecalciferol (VITAMIN D-3 PO), Calcium-Magnesium, Co Q 10, Omega 3, simvastatin, levothyroxine, and valsartan-hydrochlorothiazide.  Meds ordered this encounter  Medications  . hyoscyamine (LEVSIN/SL) 0.125 MG SL tablet    Sig: Place 1 tablet (0.125 mg total) under the tongue every 4 (four) hours as needed. For colon spasm    Dispense:  30 tablet    Refill:  2  . omeprazole (PRILOSEC) 20 MG capsule    Sig: Take 1 capsule (20 mg total) by mouth daily. Take on an empty stomach    Dispense:  30 capsule    Refill:  2   Elevated BP. Monitor for continued elevation once current symptoms resolve.  Follow-up: No Follow-up on file.  Sophia [name], M.D.

## 2015-04-27 LAB — CBC WITH DIFFERENTIAL/PLATELET
Basophils Absolute: 0 10*3/uL (ref 0.0–0.2)
Basos: 0 %
EOS (ABSOLUTE): 0.1 10*3/uL (ref 0.0–0.4)
Eos: 1 %
Hematocrit: 32.9 % — ABNORMAL LOW (ref 34.0–46.6)
Hemoglobin: 11 g/dL — ABNORMAL LOW (ref 11.1–15.9)
Immature Grans (Abs): 0 10*3/uL (ref 0.0–0.1)
Immature Granulocytes: 0 %
Lymphocytes Absolute: 1.3 10*3/uL (ref 0.7–3.1)
Lymphs: 13 %
MCH: 31.3 pg (ref 26.6–33.0)
MCHC: 33.4 g/dL (ref 31.5–35.7)
MCV: 94 fL (ref 79–97)
Monocytes Absolute: 0.4 10*3/uL (ref 0.1–0.9)
Monocytes: 4 %
Neutrophils Absolute: 8.4 10*3/uL — ABNORMAL HIGH (ref 1.4–7.0)
Neutrophils: 82 %
Platelets: 432 10*3/uL — ABNORMAL HIGH (ref 150–379)
RBC: 3.51 x10E6/uL — ABNORMAL LOW (ref 3.77–5.28)
RDW: 13 % (ref 12.3–15.4)
WBC: 10.2 10*3/uL (ref 3.4–10.8)

## 2015-04-27 LAB — CMP14+EGFR
ALT: 12 IU/L (ref 0–32)
AST: 20 IU/L (ref 0–40)
Albumin/Globulin Ratio: 1.2 (ref 1.1–2.5)
Albumin: 3.6 g/dL (ref 3.5–4.7)
Alkaline Phosphatase: 65 IU/L (ref 39–117)
BUN/Creatinine Ratio: 21 (ref 11–26)
BUN: 29 mg/dL — ABNORMAL HIGH (ref 8–27)
Bilirubin Total: 0.4 mg/dL (ref 0.0–1.2)
CO2: 24 mmol/L (ref 18–29)
Calcium: 9.5 mg/dL (ref 8.7–10.3)
Chloride: 101 mmol/L (ref 97–106)
Creatinine, Ser: 1.35 mg/dL — ABNORMAL HIGH (ref 0.57–1.00)
GFR calc Af Amer: 42 mL/min/{1.73_m2} — ABNORMAL LOW (ref >59)
GFR calc non Af Amer: 37 mL/min/{1.73_m2} — ABNORMAL LOW (ref >59)
Globulin, Total: 3.1 g/dL (ref 1.5–4.5)
Glucose: 150 mg/dL — ABNORMAL HIGH (ref 65–99)
Potassium: 4.2 mmol/L (ref 3.5–5.2)
Sodium: 142 mmol/L (ref 136–144)
Total Protein: 6.7 g/dL (ref 6.0–8.5)

## 2015-04-27 LAB — LIPASE: Lipase: 32 U/L (ref 0–59)

## 2015-04-27 LAB — AMYLASE: Amylase: 49 U/L (ref 31–124)

## 2015-04-29 ENCOUNTER — Encounter (HOSPITAL_COMMUNITY): Payer: Self-pay | Admitting: Emergency Medicine

## 2015-04-29 ENCOUNTER — Encounter (HOSPITAL_COMMUNITY)
Admission: EM | Disposition: A | Discharge status: Home Health | Payer: Self-pay | Source: Home / Self Care | Attending: Internal Medicine

## 2015-04-29 ENCOUNTER — Inpatient Hospital Stay (HOSPITAL_COMMUNITY)
Admission: EM | Admit: 2015-04-29 | Discharge: 2015-05-08 | DRG: 377 | Disposition: A | Discharge status: Home Health | Payer: Medicare Other | Attending: Internal Medicine | Admitting: Internal Medicine

## 2015-04-29 ENCOUNTER — Emergency Department (HOSPITAL_COMMUNITY): Payer: Medicare Other

## 2015-04-29 ENCOUNTER — Inpatient Hospital Stay (HOSPITAL_COMMUNITY): Payer: Medicare Other

## 2015-04-29 DIAGNOSIS — S0181XA Laceration without foreign body of other part of head, initial encounter: Secondary | ICD-10-CM | POA: Diagnosis present

## 2015-04-29 DIAGNOSIS — J9601 Acute respiratory failure with hypoxia: Secondary | ICD-10-CM | POA: Diagnosis present

## 2015-04-29 DIAGNOSIS — E039 Hypothyroidism, unspecified: Secondary | ICD-10-CM | POA: Diagnosis present

## 2015-04-29 DIAGNOSIS — E870 Hyperosmolality and hypernatremia: Secondary | ICD-10-CM | POA: Diagnosis not present

## 2015-04-29 DIAGNOSIS — E8770 Fluid overload, unspecified: Secondary | ICD-10-CM | POA: Diagnosis not present

## 2015-04-29 DIAGNOSIS — R109 Unspecified abdominal pain: Secondary | ICD-10-CM

## 2015-04-29 DIAGNOSIS — Z9049 Acquired absence of other specified parts of digestive tract: Secondary | ICD-10-CM | POA: Diagnosis not present

## 2015-04-29 DIAGNOSIS — K922 Gastrointestinal hemorrhage, unspecified: Secondary | ICD-10-CM | POA: Diagnosis present

## 2015-04-29 DIAGNOSIS — R579 Shock, unspecified: Secondary | ICD-10-CM | POA: Diagnosis not present

## 2015-04-29 DIAGNOSIS — R578 Other shock: Secondary | ICD-10-CM | POA: Diagnosis present

## 2015-04-29 DIAGNOSIS — Z7982 Long term (current) use of aspirin: Secondary | ICD-10-CM

## 2015-04-29 DIAGNOSIS — I10 Essential (primary) hypertension: Secondary | ICD-10-CM | POA: Diagnosis present

## 2015-04-29 DIAGNOSIS — J9600 Acute respiratory failure, unspecified whether with hypoxia or hypercapnia: Secondary | ICD-10-CM | POA: Diagnosis present

## 2015-04-29 DIAGNOSIS — N179 Acute kidney failure, unspecified: Secondary | ICD-10-CM | POA: Diagnosis not present

## 2015-04-29 DIAGNOSIS — D62 Acute posthemorrhagic anemia: Secondary | ICD-10-CM | POA: Diagnosis present

## 2015-04-29 DIAGNOSIS — E1122 Type 2 diabetes mellitus with diabetic chronic kidney disease: Secondary | ICD-10-CM | POA: Diagnosis present

## 2015-04-29 DIAGNOSIS — G9341 Metabolic encephalopathy: Secondary | ICD-10-CM | POA: Diagnosis not present

## 2015-04-29 DIAGNOSIS — E119 Type 2 diabetes mellitus without complications: Secondary | ICD-10-CM | POA: Diagnosis not present

## 2015-04-29 DIAGNOSIS — R0902 Hypoxemia: Secondary | ICD-10-CM | POA: Diagnosis not present

## 2015-04-29 DIAGNOSIS — E872 Acidosis, unspecified: Secondary | ICD-10-CM | POA: Diagnosis present

## 2015-04-29 DIAGNOSIS — N189 Chronic kidney disease, unspecified: Secondary | ICD-10-CM | POA: Diagnosis present

## 2015-04-29 DIAGNOSIS — N390 Urinary tract infection, site not specified: Secondary | ICD-10-CM | POA: Diagnosis present

## 2015-04-29 DIAGNOSIS — N183 Chronic kidney disease, stage 3 (moderate): Secondary | ICD-10-CM | POA: Diagnosis present

## 2015-04-29 DIAGNOSIS — T794XXA Traumatic shock, initial encounter: Secondary | ICD-10-CM | POA: Diagnosis not present

## 2015-04-29 DIAGNOSIS — Z79899 Other long term (current) drug therapy: Secondary | ICD-10-CM | POA: Diagnosis not present

## 2015-04-29 DIAGNOSIS — Z7401 Bed confinement status: Secondary | ICD-10-CM | POA: Diagnosis not present

## 2015-04-29 DIAGNOSIS — I4891 Unspecified atrial fibrillation: Secondary | ICD-10-CM | POA: Diagnosis not present

## 2015-04-29 DIAGNOSIS — D500 Iron deficiency anemia secondary to blood loss (chronic): Secondary | ICD-10-CM | POA: Diagnosis not present

## 2015-04-29 DIAGNOSIS — R55 Syncope and collapse: Secondary | ICD-10-CM | POA: Diagnosis not present

## 2015-04-29 DIAGNOSIS — I739 Peripheral vascular disease, unspecified: Secondary | ICD-10-CM | POA: Diagnosis present

## 2015-04-29 DIAGNOSIS — R571 Hypovolemic shock: Secondary | ICD-10-CM | POA: Diagnosis present

## 2015-04-29 DIAGNOSIS — J9690 Respiratory failure, unspecified, unspecified whether with hypoxia or hypercapnia: Secondary | ICD-10-CM

## 2015-04-29 DIAGNOSIS — R0682 Tachypnea, not elsewhere classified: Secondary | ICD-10-CM | POA: Diagnosis not present

## 2015-04-29 DIAGNOSIS — R778 Other specified abnormalities of plasma proteins: Secondary | ICD-10-CM | POA: Diagnosis present

## 2015-04-29 DIAGNOSIS — D72829 Elevated white blood cell count, unspecified: Secondary | ICD-10-CM | POA: Diagnosis present

## 2015-04-29 DIAGNOSIS — D689 Coagulation defect, unspecified: Secondary | ICD-10-CM | POA: Diagnosis present

## 2015-04-29 DIAGNOSIS — R001 Bradycardia, unspecified: Secondary | ICD-10-CM | POA: Diagnosis not present

## 2015-04-29 DIAGNOSIS — K264 Chronic or unspecified duodenal ulcer with hemorrhage: Secondary | ICD-10-CM | POA: Diagnosis not present

## 2015-04-29 DIAGNOSIS — B9620 Unspecified Escherichia coli [E. coli] as the cause of diseases classified elsewhere: Secondary | ICD-10-CM | POA: Diagnosis not present

## 2015-04-29 DIAGNOSIS — K260 Acute duodenal ulcer with hemorrhage: Secondary | ICD-10-CM | POA: Diagnosis not present

## 2015-04-29 DIAGNOSIS — R7989 Other specified abnormal findings of blood chemistry: Secondary | ICD-10-CM | POA: Diagnosis not present

## 2015-04-29 DIAGNOSIS — R1032 Left lower quadrant pain: Secondary | ICD-10-CM | POA: Diagnosis not present

## 2015-04-29 DIAGNOSIS — I129 Hypertensive chronic kidney disease with stage 1 through stage 4 chronic kidney disease, or unspecified chronic kidney disease: Secondary | ICD-10-CM | POA: Diagnosis present

## 2015-04-29 DIAGNOSIS — R404 Transient alteration of awareness: Secondary | ICD-10-CM | POA: Diagnosis not present

## 2015-04-29 DIAGNOSIS — E785 Hyperlipidemia, unspecified: Secondary | ICD-10-CM | POA: Diagnosis present

## 2015-04-29 DIAGNOSIS — E876 Hypokalemia: Secondary | ICD-10-CM | POA: Diagnosis not present

## 2015-04-29 DIAGNOSIS — Z9289 Personal history of other medical treatment: Secondary | ICD-10-CM

## 2015-04-29 DIAGNOSIS — Z4682 Encounter for fitting and adjustment of non-vascular catheter: Secondary | ICD-10-CM | POA: Diagnosis not present

## 2015-04-29 DIAGNOSIS — I248 Other forms of acute ischemic heart disease: Secondary | ICD-10-CM | POA: Diagnosis present

## 2015-04-29 DIAGNOSIS — Z791 Long term (current) use of non-steroidal anti-inflammatories (NSAID): Secondary | ICD-10-CM

## 2015-04-29 DIAGNOSIS — K920 Hematemesis: Secondary | ICD-10-CM | POA: Diagnosis not present

## 2015-04-29 DIAGNOSIS — T4275XA Adverse effect of unspecified antiepileptic and sedative-hypnotic drugs, initial encounter: Secondary | ICD-10-CM | POA: Diagnosis not present

## 2015-04-29 DIAGNOSIS — K921 Melena: Secondary | ICD-10-CM | POA: Diagnosis not present

## 2015-04-29 DIAGNOSIS — R1110 Vomiting, unspecified: Secondary | ICD-10-CM | POA: Diagnosis not present

## 2015-04-29 HISTORY — PX: ESOPHAGOGASTRODUODENOSCOPY: SHX5428

## 2015-04-29 LAB — COMPREHENSIVE METABOLIC PANEL
ALT: 15 U/L (ref 14–54)
AST: 24 U/L (ref 15–41)
Albumin: 2.1 g/dL — ABNORMAL LOW (ref 3.5–5.0)
Alkaline Phosphatase: 37 U/L — ABNORMAL LOW (ref 38–126)
Anion gap: 8 (ref 5–15)
BUN: 44 mg/dL — ABNORMAL HIGH (ref 6–20)
CO2: 20 mmol/L — ABNORMAL LOW (ref 22–32)
Calcium: 7.4 mg/dL — ABNORMAL LOW (ref 8.9–10.3)
Chloride: 116 mmol/L — ABNORMAL HIGH (ref 101–111)
Creatinine, Ser: 1.44 mg/dL — ABNORMAL HIGH (ref 0.44–1.00)
GFR calc Af Amer: 38 mL/min — ABNORMAL LOW (ref >60)
GFR calc non Af Amer: 33 mL/min — ABNORMAL LOW (ref >60)
Glucose, Bld: 165 mg/dL — ABNORMAL HIGH (ref 65–99)
Potassium: 4 mmol/L (ref 3.5–5.1)
Sodium: 144 mmol/L (ref 135–145)
Total Bilirubin: 0.3 mg/dL (ref 0.3–1.2)
Total Protein: 4.6 g/dL — ABNORMAL LOW (ref 6.5–8.1)

## 2015-04-29 LAB — I-STAT CHEM 8, ED
BUN: 44 mg/dL — ABNORMAL HIGH (ref 6–20)
Calcium, Ion: 1.08 mmol/L — ABNORMAL LOW (ref 1.13–1.30)
Chloride: 114 mmol/L — ABNORMAL HIGH (ref 101–111)
Creatinine, Ser: 1.3 mg/dL — ABNORMAL HIGH (ref 0.44–1.00)
Glucose, Bld: 151 mg/dL — ABNORMAL HIGH (ref 65–99)
HCT: 18 % — ABNORMAL LOW (ref 36.0–46.0)
Hemoglobin: 6.1 g/dL — CL (ref 12.0–15.0)
Potassium: 3.9 mmol/L (ref 3.5–5.1)
Sodium: 145 mmol/L (ref 135–145)
TCO2: 18 mmol/L (ref 0–100)

## 2015-04-29 LAB — CBC
HCT: 19.6 % — ABNORMAL LOW (ref 36.0–46.0)
Hemoglobin: 6.5 g/dL — CL (ref 12.0–15.0)
MCH: 31.9 pg (ref 26.0–34.0)
MCHC: 33.2 g/dL (ref 30.0–36.0)
MCV: 96.1 fL (ref 78.0–100.0)
Platelets: 326 10*3/uL (ref 150–400)
RBC: 2.04 MIL/uL — ABNORMAL LOW (ref 3.87–5.11)
RDW: 12.2 % (ref 11.5–15.5)
WBC: 24.6 10*3/uL — ABNORMAL HIGH (ref 4.0–10.5)

## 2015-04-29 LAB — PREPARE RBC (CROSSMATCH)

## 2015-04-29 LAB — PROTIME-INR
INR: 1.48 (ref 0.00–1.49)
Prothrombin Time: 18 seconds — ABNORMAL HIGH (ref 11.6–15.2)

## 2015-04-29 LAB — ABO/RH: ABO/RH(D): A POS

## 2015-04-29 LAB — POC OCCULT BLOOD, ED: Fecal Occult Bld: POSITIVE — AB

## 2015-04-29 LAB — APTT: aPTT: 30 seconds (ref 24–37)

## 2015-04-29 SURGERY — EGD (ESOPHAGOGASTRODUODENOSCOPY)
Anesthesia: Moderate Sedation

## 2015-04-29 MED ORDER — DIPHENHYDRAMINE HCL 50 MG/ML IJ SOLN
INTRAMUSCULAR | Status: AC
  Filled 2015-04-29: qty 1

## 2015-04-29 MED ORDER — MIDAZOLAM HCL 2 MG/2ML IJ SOLN: 1.0000 mg | INTRAMUSCULAR | Status: DC | PRN

## 2015-04-29 MED ORDER — SODIUM CHLORIDE 0.9 % IV SOLN
25.0000 ug/h | INTRAVENOUS | Status: DC
  Administered 2015-05-01: 50 ug/h via INTRAVENOUS
  Filled 2015-04-29: qty 50

## 2015-04-29 MED ORDER — SODIUM CHLORIDE 0.9 % IV SOLN
INTRAVENOUS | Status: AC | PRN
Start: 2015-04-29 — End: 2015-04-30
  Administered 2015-04-29 (×3): 1000 mL via INTRAVENOUS
  Administered 2015-04-30: 1000 mL/h via INTRAVENOUS

## 2015-04-29 MED ORDER — LIDOCAINE HCL 1 % IJ SOLN
INTRAMUSCULAR | Status: AC
  Filled 2015-04-29: qty 20

## 2015-04-29 MED ORDER — FENTANYL CITRATE (PF) 100 MCG/2ML IJ SOLN
INTRAMUSCULAR | Status: AC
  Filled 2015-04-29: qty 2

## 2015-04-29 MED ORDER — ETOMIDATE 2 MG/ML IV SOLN
INTRAVENOUS | Status: AC | PRN
  Administered 2015-04-29: 20 mg via INTRAVENOUS

## 2015-04-29 MED ORDER — SODIUM CHLORIDE 0.9 % IV SOLN
1000.0000 mL | Freq: Once | INTRAVENOUS | Status: AC
  Administered 2015-04-29: 1000 mL via INTRAVENOUS

## 2015-04-29 MED ORDER — SODIUM CHLORIDE 0.9 % IV SOLN: Freq: Once | INTRAVENOUS | Status: DC

## 2015-04-29 MED ORDER — SODIUM CHLORIDE 0.9 % IV SOLN
50.0000 ug/h | INTRAVENOUS | Status: DC
  Administered 2015-04-29: 50 ug/h via INTRAVENOUS
  Filled 2015-04-29 (×2): qty 1

## 2015-04-29 MED ORDER — MIDAZOLAM HCL 5 MG/ML IJ SOLN
INTRAMUSCULAR | Status: AC
  Filled 2015-04-29: qty 2

## 2015-04-29 MED ORDER — SODIUM CHLORIDE 0.9 % IV SOLN
8.0000 mg/h | INTRAVENOUS | Status: AC
  Administered 2015-04-29 – 2015-05-02 (×8): 8 mg/h via INTRAVENOUS
  Filled 2015-04-29 (×14): qty 80

## 2015-04-29 MED ORDER — MIDAZOLAM HCL 2 MG/2ML IJ SOLN
INTRAMUSCULAR | Status: AC
  Filled 2015-04-29: qty 4

## 2015-04-29 MED ORDER — SODIUM CHLORIDE 0.9 % IV SOLN: 10.0000 mL/h | Freq: Once | INTRAVENOUS | Status: DC

## 2015-04-29 MED ORDER — NOREPINEPHRINE BITARTRATE 1 MG/ML IV SOLN
2.0000 ug/min | INTRAVENOUS | Status: DC
  Administered 2015-04-30: 10 ug/min via INTRAVENOUS
  Administered 2015-04-30: 50 ug/min via INTRAVENOUS
  Filled 2015-04-29 (×4): qty 4

## 2015-04-29 MED ORDER — KETAMINE HCL 10 MG/ML IJ SOLN: 150.0000 mg | Freq: Once | INTRAMUSCULAR | Status: DC

## 2015-04-29 MED ORDER — PANTOPRAZOLE SODIUM 40 MG IV SOLR
80.0000 mg | Freq: Once | INTRAVENOUS | Status: AC
  Administered 2015-04-29: 80 mg via INTRAVENOUS
  Filled 2015-04-29: qty 80

## 2015-04-29 MED ORDER — MIDAZOLAM HCL 2 MG/2ML IJ SOLN
INTRAMUSCULAR | Status: AC
  Filled 2015-04-29: qty 2

## 2015-04-29 MED ORDER — LEVOTHYROXINE SODIUM 100 MCG IV SOLR
12.5000 ug | Freq: Every day | INTRAVENOUS | Status: DC
  Administered 2015-04-30 – 2015-05-03 (×4): 12.5 ug via INTRAVENOUS
  Filled 2015-04-29 (×4): qty 5

## 2015-04-29 MED ORDER — MIDAZOLAM HCL 2 MG/2ML IJ SOLN
INTRAMUSCULAR | Status: AC | PRN
  Administered 2015-04-29: 2 mg via INTRAVENOUS
  Administered 2015-04-30: 1 mg via INTRAVENOUS

## 2015-04-29 MED ORDER — CALCIUM GLUCONATE 10 % IV SOLN
1.0000 g | Freq: Once | INTRAVENOUS | Status: AC
  Administered 2015-04-29: 1 g via INTRAVENOUS
  Filled 2015-04-29: qty 10

## 2015-04-29 MED ORDER — FENTANYL CITRATE (PF) 100 MCG/2ML IJ SOLN
INTRAMUSCULAR | Status: AC | PRN
  Administered 2015-04-29: 100 ug via INTRAVENOUS
  Administered 2015-04-30: 50 ug via INTRAVENOUS

## 2015-04-29 MED ORDER — FENTANYL CITRATE (PF) 100 MCG/2ML IJ SOLN: 50.0000 ug | Freq: Once | INTRAMUSCULAR | Status: DC

## 2015-04-29 MED ORDER — ONDANSETRON HCL 4 MG/2ML IJ SOLN
INTRAMUSCULAR | Status: AC
  Filled 2015-04-29: qty 4

## 2015-04-29 MED ORDER — FENTANYL BOLUS VIA INFUSION
25.0000 ug | INTRAVENOUS | Status: DC | PRN
  Filled 2015-04-29: qty 25

## 2015-04-29 MED ORDER — EPINEPHRINE HCL 0.1 MG/ML IJ SOSY
PREFILLED_SYRINGE | INTRAMUSCULAR | Status: AC
  Filled 2015-04-29: qty 10

## 2015-04-29 MED ORDER — PROMETHAZINE HCL 25 MG/ML IJ SOLN
25.0000 mg | Freq: Once | INTRAMUSCULAR | Status: AC
  Administered 2015-04-29: 25 mg via INTRAVENOUS
  Filled 2015-04-29: qty 1

## 2015-04-29 MED ORDER — SODIUM CHLORIDE 0.9 % IV SOLN: 8.0000 mg | Freq: Once | INTRAVENOUS | Status: DC

## 2015-04-29 MED ORDER — SODIUM CHLORIDE 0.9 % IV SOLN: 250.0000 mL | INTRAVENOUS | Status: DC | PRN

## 2015-04-29 MED ORDER — PANTOPRAZOLE SODIUM 40 MG IV SOLR
40.0000 mg | Freq: Two times a day (BID) | INTRAVENOUS | Status: DC
  Administered 2015-05-03 – 2015-05-05 (×6): 40 mg via INTRAVENOUS
  Filled 2015-04-29 (×9): qty 40

## 2015-04-29 MED ORDER — OCTREOTIDE LOAD VIA INFUSION
50.0000 ug | Freq: Once | INTRAVENOUS | Status: AC
  Administered 2015-04-29 – 2015-04-30 (×2): 50 ug via INTRAVENOUS
  Filled 2015-04-29: qty 25

## 2015-04-29 MED ORDER — SODIUM CHLORIDE 0.9 % IV SOLN
8.0000 mg | Freq: Once | INTRAVENOUS | Status: DC
  Filled 2015-04-29: qty 4

## 2015-04-29 MED ORDER — NOREPINEPHRINE BITARTRATE 1 MG/ML IV SOLN
5.0000 ug/min | INTRAVENOUS | Status: DC
  Administered 2015-04-29: 5 ug/min via INTRAVENOUS
  Filled 2015-04-29: qty 4

## 2015-04-29 MED ORDER — FENTANYL CITRATE (PF) 100 MCG/2ML IJ SOLN
INTRAMUSCULAR | Status: AC
  Filled 2015-04-29: qty 4

## 2015-04-29 MED ORDER — NOREPINEPHRINE BITARTRATE 1 MG/ML IV SOLN: 2.0000 ug/min | INTRAVENOUS | Status: DC

## 2015-04-29 MED ORDER — TETANUS-DIPHTH-ACELL PERTUSSIS 5-2.5-18.5 LF-MCG/0.5 IM SUSP
0.5000 mL | Freq: Once | INTRAMUSCULAR | Status: AC
  Administered 2015-04-29: 0.5 mL via INTRAMUSCULAR
  Filled 2015-04-29: qty 0.5

## 2015-04-29 MED ORDER — SODIUM CHLORIDE 0.9 % IV SOLN
1000.0000 mL | INTRAVENOUS | Status: DC
  Administered 2015-04-29: 1000 mL via INTRAVENOUS

## 2015-04-29 MED ORDER — KETAMINE HCL 10 MG/ML IJ SOLN
2.0000 mg/kg | Freq: Once | INTRAMUSCULAR | Status: DC
  Filled 2015-04-29: qty 11.6

## 2015-04-29 NOTE — Progress Notes (Signed)
Chaplain was paged by nurse to support Pt that had a sudden change in condition. Family was in consultation room. Chaplain supported by getting drinks and providing information and prayer. Chaplain also escorted granddaughter to consultation. Nurses will take family to radiology when needed.    04/29/15 2300  Clinical Encounter Type  Visited With Family  Visit Type Initial;Spiritual support  Referral From Nurse  Spiritual Encounters  Spiritual Needs Prayer;Emotional  Stress Factors  Patient Stress Factors Health changes  Family Stress Factors Health changes

## 2015-04-29 NOTE — ED Notes (Signed)
Pt in EMS from home, reporting near syncopal episode and several episodes of GI bleeding today. Pt had laceration and hematoma to head as of result from falling. Pt covered in GI blood. Pt pale upon assessment, but A/OX4

## 2015-04-29 NOTE — ED Notes (Signed)
Endoscopy staff at the bedside.

## 2015-04-29 NOTE — H&P (Signed)
PULMONARY / CRITICAL CARE MEDICINE   Name: Sophia [name] MRN: [id number] DOB: 1932/04/25    ADMISSION DATE:  04/29/2015 CONSULTATION DATE:  04/29/15  REFERRING MD:  EDP  CHIEF COMPLAINT:  GI bleed  HISTORY OF PRESENT ILLNESS:   Sophia [name] is an 81 y.o. F with PMH as outlined below.  She was brought to [facility] ED 12/12 due to near syncopal episode along with melena.  She did fall and suffered a laceration to her forehead.    On arrival to ED, she was apparently covered in melena and blood.  Once in ED stretcher, she began to have active hematemesis.  SBP was in the 70's and Hgb 6.5 (was 11.0 just 3 days prior).  She was given 4L IVF and had 4u PRBC ordered.  GI was called and planning for bedside endoscopy in ED.  PCCM called for admission.  PAST MEDICAL HISTORY :  She  has a past medical history of Hypertension; Hyperlipidemia; Diabetes mellitus without complication ([place]); and Chronic kidney disease.  PAST SURGICAL HISTORY: She  has past surgical history that includes Cholecystectomy and Sigmoidoscopy.  Allergies  Allergen Reactions  . Levothyroxine Other (See Comments)    GI pains    No current facility-administered medications on file prior to encounter.   Current Outpatient Prescriptions on File Prior to Encounter  Medication Sig  . aspirin 81 MG tablet Take 81 mg by mouth daily.  . Calcium-Magnesium 500-250 MG TABS Take 1 tablet by mouth daily as needed (takes once in while).   . Coenzyme Q10 (CO Q 10) 100 MG CAPS Take 100 mg by mouth daily as needed (takes once in while).   . hyoscyamine (LEVSIN/SL) 0.125 MG SL tablet Place 1 tablet (0.125 mg total) under the tongue every 4 (four) hours as needed. For colon spasm  . Multiple Vitamins-Minerals (MULTIVITAMIN WITH MINERALS) tablet Take 1 tablet by mouth daily.  . Omega 3 1200 MG CAPS Take 1,200 mg by mouth daily as needed (takes once in while).   Marland Kitchen omeprazole (PRILOSEC) 20 MG capsule Take 1 capsule (20 mg total) by  mouth daily. Take on an empty stomach  . simvastatin (ZOCOR) 40 MG tablet Take 0.5 tablets (20 mg total) by mouth daily.  . valsartan-hydrochlorothiazide (DIOVAN-HCT) 320-25 MG tablet TAKE ONE TABLET BY MOUTH ONCE DAILY  . levothyroxine (SYNTHROID, LEVOTHROID) 25 MCG tablet TAKE ONE TABLET BY MOUTH ONCE DAILY BEFORE BREAKFAST    FAMILY HISTORY:  Her indicated that her mother is deceased. She indicated that her father is deceased. She indicated that only one of her two sisters is alive. She indicated that her brother is deceased.   SOCIAL HISTORY: She  reports that she has never smoked. She has never used smokeless tobacco. She reports that she does not drink alcohol or use illicit drugs.  REVIEW OF SYSTEMS:   All negative; except for those that are bolded, which indicate positives.  Constitutional: weight loss, weight gain, night sweats, fevers, chills, fatigue, weakness.  HEENT: headaches, sore throat, sneezing, nasal congestion, post nasal drip, difficulty swallowing, tooth/dental problems, visual complaints, visual changes, ear aches. Neuro: difficulty with speech, weakness, numbness, ataxia. CV:  chest pain, orthopnea, PND, swelling in lower extremities, dizziness, palpitations, syncope.  Resp: cough, hemoptysis, dyspnea, wheezing. GI  heartburn, indigestion, abdominal pain, nausea, vomiting, diarrhea, constipation, change in bowel habits, loss of appetite, hematemesis, melena, hematochezia.  GU: dysuria, change in color of urine, urgency or frequency, flank pain, hematuria. MSK: joint pain or swelling, decreased range of  motion. Psych: change in mood or affect, depression, anxiety, suicidal ideations, homicidal ideations. Skin: rash, itching, bruising.   SUBJECTIVE:   Has had several episodes of hematemesis while in ED.  VITAL SIGNS: BP 92/61 mmHg  Pulse 123  Temp(Src) 98.3 F (36.8 C) (Oral)  Resp 24  SpO2 100%  HEMODYNAMICS:    VENTILATOR SETTINGS:    INTAKE /  OUTPUT:     PHYSICAL EXAMINATION: General: Elderly female, in NAD. Neuro: A&O x 3, non-focal.  HEENT: [place]/AT. PERRL, sclerae anicteric. Cardiovascular: Tachy, regular, no M/R/G.  Lungs: Respirations even and unlabored.  CTA bilaterally, No W/R/R. Abdomen: BS x 4, soft, NT/ND.  Musculoskeletal: No gross deformities, no edema.  Skin: Pale, warm, no rashes.  LABS:  BMET  Recent Labs Lab 04/26/15 1539 04/29/15 1930 04/29/15 2012  NA 142 144 145  K 4.2 4.0 3.9  CL 101 116* 114*  CO2 24 20*  --   BUN 29* 44* 44*  CREATININE 1.35* 1.44* 1.30*  GLUCOSE 150* 165* 151*    Electrolytes  Recent Labs Lab 04/26/15 1539 04/29/15 1930  CALCIUM 9.5 7.4*    CBC  Recent Labs Lab 04/26/15 1539 04/29/15 1930 04/29/15 2012  WBC 10.2 24.6*  --   HGB  --  6.5* 6.1*  HCT 32.9* 19.6* 18.0*  PLT  --  326  --     Coag's  Recent Labs Lab 04/29/15 1930  APTT 30  INR 1.48    Sepsis Markers No results for input(s): LATICACIDVEN, PROCALCITON, O2SATVEN in the last 168 hours.  ABG No results for input(s): PHART, PCO2ART, PO2ART in the last 168 hours.  Liver Enzymes  Recent Labs Lab 04/26/15 1539 04/29/15 1930  AST 20 24  ALT 12 15  ALKPHOS 65 37*  BILITOT 0.4 0.3  ALBUMIN 3.6 2.1*    Cardiac Enzymes No results for input(s): TROPONINI, PROBNP in the last 168 hours.  Glucose No results for input(s): GLUCAP in the last 168 hours.  Imaging Dg Abd Portable 1v  04/29/2015  CLINICAL DATA:  81 year old female with bloody stool and vomiting blood EXAM: PORTABLE ABDOMEN - 1 VIEW COMPARISON:  None. FINDINGS: Single upright radiograph demonstrates gas within the stomach. No free air identified. External defibrillator pads project over the left chest and right upper quadrant. The heart is likely mildly enlarged. The lungs appear clear. No acute osseous abnormality. IMPRESSION: 1. No free air visualized. 2. Mild cardiomegaly. 3. The lungs appear clear. Electronically Signed    By: Jacqulynn Cadet M.D.   On: 04/29/2015 21:43     STUDIES:  AXR 12/12 > no free air. CXR 12/12 >  CULTURES: None  ANTIBIOTICS: None  SIGNIFICANT EVENTS: 12/12 > admitted with UGIB  LINES/TUBES: ETT 12/12 > CVL pending 12/12 >   ASSESSMENT / PLAN:  GASTROINTESTINAL A:   UGIB of unclear etiology. GI prophylaxis. Nutrition. P:   GI called, planning for bedside EGD now. Continue octreotide, protonix infusions. NPO.  HEMATOLOGIC A:   Acute blood loss anemia. VTE Prophylaxis. P:  H/H q6hrs. Transfuse for Hgb < 7. SCD's only. CBC in AM.  PULMONARY A: VDRF due to inability to protect airway. P:   Intubate now. Full vent support. Wean as able. SBT in AM if stabilized. CXR in AM.  CARDIOVASCULAR A:  Shock - presumed hemorrhagic due to UGIB. Hx HTN, HLD. P:  Levophed as needed for goal MAP > 65. Assess lactate. Hold outpatient ASA, simvastatin, diovan.  RENAL A:   CKD. Hypocalcemia. P:  1g Ca gluconate. BMP in AM.  INFECTIOUS A:   No indication of infection. P:   Monitor clinically.  ENDOCRINE A:   DM - not on outpatient meds.   Hypothyroidism. P:   Assess Hgb A1c. Continue outpatient synthroid, change to IV formulation.  NEUROLOGIC A:   Acute metabolic encephalopathy due to sedation. P:   Sedation:  Fentanyl gtt / Midazolam PRN. RASS goal: 0 to -1. Daily WUA.   Family updated: Multiple family members at bedside. Dr Lamonte Sakai discussed status and plans with husband and daughter.   Interdisciplinary Family Meeting v Palliative Care Meeting:  Due by: 12/18.   Montey Hora, [place] [facility] Pager: [phone]  or [phone] 04/29/2015, 9:46 PM  Attending Note:  I have examined patient, reviewed labs, studies and notes on 04/29/15. This note is signed after MN but reflects care from evening 12/12. I have discussed the case with [place], and I agree with the data and plans as amended  above. Pt with minimal hx, presented with massive UGIB and hematemesis. She developed lethargy and shock, received aggressive blood products but continued to bleed. On my eval she would wake and answer questions, was cool and clammy as norepi was being started. We intubated her to facilitate EGD by Dr Watt Climes. This identified large bleed that was difficult to localize but was probably from duodenal bulb. I discussed teh case with Drs Watt Climes and [facility] and we decided to send her to IR for angiography and hopefully embolization of culprit vessel. I explained her status and the plans to her husband and daughter. Independent critical care time is 90 minutes not including procedures and PA time.   Baltazar Apo, MD, PhD 04/30/2015, 2:18 AM [facility] [phone] or if no answer [phone]

## 2015-04-29 NOTE — Op Note (Addendum)
[facility] [place], [id number]   ENDOSCOPY PROCEDURE REPORT  PATIENT: Sophia [name], Sophia [name]  MR#: [id number] BIRTHDATE: Dec 01, 1931 , 83  yrs. old GENDER: female ENDOSCOPIST: Clarene Essex, MD REFERRED BY: PROCEDURE DATE:  01-May-2015 PROCEDURE:  EGD, diagnostic ASA CLASS:     Class III INDICATIONS:  hematemesis. MEDICATIONS: per ER nurse TOPICAL ANESTHETIC: none  DESCRIPTION OF PROCEDURE: After the risks benefits and alternatives of the procedure were thoroughly explained, informed consent was obtained.  The Pentax Gastroscope [id number] endoscope was introduced through the mouth and advanced to the second portion of the duodenum , Without limitations.  The instrument was slowly withdrawn as the mucosa was attempted to be examined but with increased blood and clots visualization was incredibly difficultEstimated blood loss was approximately 2 L during the procedure which was suctioned but still visualization was poor with at least that much still in the upper tract unless otherwise noted in this procedure report.    findings are recorded below       Retroflexed views revealed Blood and clots.     The scope was then withdrawn from the patient and the procedure completed.  COMPLICATIONS: There were no immediate complications.  ENDOSCOPIC IMPRESSION: 1. Normal esophagus with lots of blood refluxing 2. Initially felt that bleeding was coming from the proximal stomach with seemingly increased flow in that area and increase fresh blood 3. But at the end of the procedure there seemed to be more fresh blood in the duodenal bulb with some increased flow there and possibly a large ulcer with a large clot but unable to definitively find bleeding source and only able to raise head of bed and not change her from her back  RECOMMENDATIONS: IR to attempt to stop next and care per critical care team and if stable in a.m. consider repeat  endoscopy  REPEAT EXAM: as needed  eSigned:  Clarene Essex, MD 01-May-2015 11:25 PM Revised: May 01, 2015 11:25 PM   CC:  CPT CODES: ICD CODES:  The ICD and CPT codes recommended by this software are interpretations from the data that the clinical staff has captured with the software.  The verification of the translation of this report to the ICD and CPT codes and modifiers is the sole responsibility of the health care institution and practicing physician where this report was generated.  [employer] will not be held responsible for the validity of the ICD and CPT codes included on this report.  AMA assumes no liability for data contained or not contained herein. CPT is a Designer, television/film set of the [employer].  PATIENT NAME:  [name], [name] MR#: [id number]

## 2015-04-29 NOTE — Consult Note (Signed)
Reason for Consult: Upper GI bleeding Referring Physician: ER physician  Sophia [name] is an 81 y.o. female.  HPI: Patient seen and examined and discussed 2 with the ER physician and the critical care team and her history is limited due to significant upper GI bleeding and she is on some aspirin and nonsteroidals at home and began throwing up earlier today and a previous colonoscopy was reviewed and her hospital computer chart was briefly reviewed but we're asked to proceed with emergent endoscopy after intubation  Past Medical History  Diagnosis Date  . Hypertension   . Hyperlipidemia   . Diabetes mellitus without complication ([place])   . Chronic kidney disease     Past Surgical History  Procedure Laterality Date  . Cholecystectomy    . Sigmoidoscopy      Family History  Problem Relation Age of Onset  . Heart disease Father   . Colon cancer Sister   . Heart disease Brother   . Heart disease Sister     Social History:  reports that she has never smoked. She has never used smokeless tobacco. She reports that she does not drink alcohol or use illicit drugs.  Allergies:  Allergies  Allergen Reactions  . Levothyroxine Other (See Comments)    GI pains    Medications: I have reviewed the patient's current medications.  Results for orders placed or performed during the hospital encounter of 04/29/15 (from the past 48 hour(s))  Comprehensive metabolic panel     Status: Abnormal   Collection Time: 04/29/15  7:30 PM  Result Value Ref Range   Sodium 144 135 - 145 mmol/L   Potassium 4.0 3.5 - 5.1 mmol/L   Chloride 116 (H) 101 - 111 mmol/L   CO2 20 (L) 22 - 32 mmol/L   Glucose, Bld 165 (H) 65 - 99 mg/dL   BUN 44 (H) 6 - 20 mg/dL   Creatinine, Ser 1.44 (H) 0.44 - 1.00 mg/dL   Calcium 7.4 (L) 8.9 - 10.3 mg/dL   Total Protein 4.6 (L) 6.5 - 8.1 g/dL   Albumin 2.1 (L) 3.5 - 5.0 g/dL   AST 24 15 - 41 U/L   ALT 15 14 - 54 U/L   Alkaline Phosphatase 37 (L) 38 - 126 U/L   Total  Bilirubin 0.3 0.3 - 1.2 mg/dL   GFR calc non Af Amer 33 (L) >60 mL/min   GFR calc Af Amer 38 (L) >60 mL/min    Comment: (NOTE) The eGFR has been calculated using the CKD EPI equation. This calculation has not been validated in all clinical situations. eGFR's persistently <60 mL/min signify possible Chronic Kidney Disease.    Anion gap 8 5 - 15  CBC     Status: Abnormal   Collection Time: 04/29/15  7:30 PM  Result Value Ref Range   WBC 24.6 (H) 4.0 - 10.5 K/uL   RBC 2.04 (L) 3.87 - 5.11 MIL/uL   Hemoglobin 6.5 (LL) 12.0 - 15.0 g/dL    Comment: REPEATED TO VERIFY CRITICAL RESULT CALLED TO, READ BACK BY AND VERIFIED WITH: T. PHILIP RN [phone] 2113 GREEN R    HCT 19.6 (L) 36.0 - 46.0 %   MCV 96.1 78.0 - 100.0 fL   MCH 31.9 26.0 - 34.0 pg   MCHC 33.2 30.0 - 36.0 g/dL   RDW 12.2 11.5 - 15.5 %   Platelets 326 150 - 400 K/uL  Type and screen [place]     Status: None (Preliminary result)  Collection Time: 04/29/15  7:30 PM  Result Value Ref Range   ABO/RH(D) A POS    Antibody Screen NEG    Sample Expiration 05/02/2015    Unit Number [id number]    Blood Component Type RED CELLS,LR    Unit division 00    Status of Unit ISSUED    Transfusion Status OK TO TRANSFUSE    Crossmatch Result Compatible    Unit Number [id number]    Blood Component Type RED CELLS,LR    Unit division 00    Status of Unit ISSUED    Transfusion Status OK TO TRANSFUSE    Crossmatch Result Compatible    Unit Number [id number]    Blood Component Type RED CELLS,LR    Unit division 00    Status of Unit ALLOCATED    Transfusion Status OK TO TRANSFUSE    Crossmatch Result Compatible    Unit Number [id number]    Blood Component Type RED CELLS,LR    Unit division 00    Status of Unit ALLOCATED    Transfusion Status OK TO TRANSFUSE    Crossmatch Result Compatible    Unit Number [id number]    Blood Component Type RED CELLS,LR    Unit division 00    Status of Unit ALLOCATED     Transfusion Status OK TO TRANSFUSE    Crossmatch Result Compatible    Unit Number [id number]    Blood Component Type RED CELLS,LR    Unit division 00    Status of Unit ISSUED    Transfusion Status OK TO TRANSFUSE    Crossmatch Result Compatible    Unit Number [id number]    Blood Component Type RED CELLS,LR    Unit division 00    Status of Unit ISSUED    Transfusion Status OK TO TRANSFUSE    Crossmatch Result Compatible    Unit Number [id number]    Blood Component Type RED CELLS,LR    Unit division 00    Status of Unit ISSUED    Transfusion Status OK TO TRANSFUSE    Crossmatch Result Compatible    Unit Number [id number]    Blood Component Type RED CELLS,LR    Unit division 00    Status of Unit ISSUED    Transfusion Status OK TO TRANSFUSE    Crossmatch Result Compatible    Unit Number [id number]    Blood Component Type RED CELLS,LR    Unit division 00    Status of Unit ALLOCATED    Transfusion Status OK TO TRANSFUSE    Crossmatch Result Compatible    Unit Number [id number]    Blood Component Type RED CELLS,LR    Unit division 00    Status of Unit ALLOCATED    Transfusion Status OK TO TRANSFUSE    Crossmatch Result Compatible   Protime-INR     Status: Abnormal   Collection Time: 04/29/15  7:30 PM  Result Value Ref Range   Prothrombin Time 18.0 (H) 11.6 - 15.2 seconds   INR 1.48 0.00 - 1.49  APTT     Status: None   Collection Time: 04/29/15  7:30 PM  Result Value Ref Range   aPTT 30 24 - 37 seconds  ABO/Rh     Status: None   Collection Time: 04/29/15  7:30 PM  Result Value Ref Range   ABO/RH(D) A POS   POC occult blood, ED     Status: Abnormal   Collection Time: 04/29/15  7:45 PM  Result Value Ref Range  Fecal Occult Bld POSITIVE (A) NEGATIVE  I-Stat Chem 8, ED  (not at [facility], [facility])     Status: Abnormal   Collection Time: 04/29/15  8:12 PM  Result Value Ref Range   Sodium 145 135 - 145 mmol/L   Potassium 3.9 3.5 - 5.1 mmol/L   Chloride 114  (H) 101 - 111 mmol/L   BUN 44 (H) 6 - 20 mg/dL   Creatinine, Ser 1.30 (H) 0.44 - 1.00 mg/dL   Glucose, Bld 151 (H) 65 - 99 mg/dL   Calcium, Ion 1.08 (L) 1.13 - 1.30 mmol/L   TCO2 18 0 - 100 mmol/L   Hemoglobin 6.1 (LL) 12.0 - 15.0 g/dL   HCT 18.0 (L) 36.0 - 46.0 %   Comment NOTIFIED PHYSICIAN   Prepare RBC     Status: None   Collection Time: 04/29/15  8:20 PM  Result Value Ref Range   Order Confirmation ORDER PROCESSED BY BLOOD BANK   Prepare RBC     Status: None   Collection Time: 04/29/15  9:39 PM  Result Value Ref Range   Order Confirmation ORDER PROCESSED BY BLOOD BANK   Prepare RBC     Status: None   Collection Time: 04/29/15 10:50 PM  Result Value Ref Range   Order Confirmation ORDER PROCESSED BY BLOOD BANK   Prepare fresh frozen plasma     Status: None (Preliminary result)   Collection Time: 04/29/15 10:51 PM  Result Value Ref Range   Unit Number [id number]    Blood Component Type THAWED PLASMA    Unit division 00    Status of Unit ISSUED    Transfusion Status OK TO TRANSFUSE    Unit Number [id number]    Blood Component Type THAWED PLASMA    Unit division 00    Status of Unit ISSUED    Transfusion Status OK TO TRANSFUSE     Dg Chest Port 1 View  04/29/2015  CLINICAL DATA:  Post intubation. EXAM: PORTABLE CHEST 1 VIEW COMPARISON:  04/29/2015 FINDINGS: Interval placement of an endotracheal tube with tip measuring 2.9 cm above the carina. Shallow inspiration. Normal heart size and pulmonary vascularity. Lungs appear clear. No blunting of costophrenic angles. No pneumothorax. Tortuous aorta. Surgical clips in the right upper quadrant. IMPRESSION: Endotracheal tube tip measures 2.9 cm above the carina. Shallow inspiration. No evidence of active pulmonary disease. Electronically Signed   By: Lucienne Capers M.D.   On: 04/29/2015 22:52   Dg Abd Portable 1v  04/29/2015  CLINICAL DATA:  81 year old female with bloody stool and vomiting blood EXAM: PORTABLE ABDOMEN - 1  VIEW COMPARISON:  None. FINDINGS: Single upright radiograph demonstrates gas within the stomach. No free air identified. External defibrillator pads project over the left chest and right upper quadrant. The heart is likely mildly enlarged. The lungs appear clear. No acute osseous abnormality. IMPRESSION: 1. No free air visualized. 2. Mild cardiomegaly. 3. The lungs appear clear. Electronically Signed   By: Jacqulynn Cadet M.D.   On: 04/29/2015 21:43    ROS not obtained Blood pressure 82/59, pulse 116, temperature 98 F (36.7 C), temperature source Oral, resp. rate 23, SpO2 100 %. Physical Exam hypotensive tachycardic on pressors exam please see preassessment evaluation pertinent for her abdomen being soft nontender labs reviewed  Assessment/Plan: Upper GI bleeding Plan: We'll proceed with emergent endoscopy with further workup and plans please see that report  [place] E 04/29/2015, 11:22 PM

## 2015-04-29 NOTE — Procedures (Signed)
Central Venous Catheter Insertion Procedure Note Sophia [name] [id number] [birth year]  Procedure: Insertion of Central Venous Catheter Indications: Assessment of intravascular volume, Drug and/or fluid administration and Frequent blood sampling  Procedure Details Consent: Unable to obtain consent because of emergent medical necessity. Time Out: Verified patient identification, verified procedure, site/side was marked, verified correct patient position, special equipment/implants available, medications/allergies/relevent history reviewed, required imaging and test results available.  Performed  Maximum sterile technique was used including antiseptics, cap, gloves, gown, hand hygiene, mask and sheet. Skin prep: Chlorhexidine; local anesthetic administered A antimicrobial bonded/coated triple lumen catheter was placed in the right femoral vein due to emergent situation using the Seldinger technique.  Evaluation Blood flow good Complications: No apparent complications Patient did tolerate procedure well.   Sophia [name], [place] [facility] Pager: [phone]  or [phone] 04/29/2015, 11:15 PM   Baltazar Apo, MD, PhD 04/30/2015, 2:27 AM [facility] [phone] or if no answer [phone]

## 2015-04-29 NOTE — Procedures (Signed)
Intubation Procedure Note Sophia [name] [id number] [birth year]  Procedure: Intubation Indications: Airway protection and maintenance  Procedure Details Consent: Risks of procedure as well as the alternatives and risks of each were explained to the (patient/caregiver).  Consent for procedure obtained. Time Out: Verified patient identification, verified procedure, site/side was marked, verified correct patient position, special equipment/implants available, medications/allergies/relevent history reviewed, required imaging and test results available.  Performed  Drugs:  100 mcg Fentanyl, 2 mg Versed, 20 mg Etomidate. VDL x 1 with #3 blade. Grade 2 view. 7.5 tube visualized passing through vocal cords. Following intubation:  positive color change on ETCO2, condensation seen in endotracheal tube, equal breath sounds bilaterally.  Evaluation Hemodynamic Status: Persistent hypotension treated with pressors and blood; O2 sats: stable throughout Patient's Current Condition: stable Complications: No apparent complications Patient did tolerate procedure well. Chest X-ray ordered to verify placement.  CXR: tube position acceptable.   Montey Hora, [place] [facility] Pager: [phone]  or [phone] 04/29/2015, 11:14 PM   Baltazar Apo, MD, PhD 04/30/2015, 2:27 AM [facility] [phone] or if no answer [phone]

## 2015-04-29 NOTE — ED Notes (Signed)
Paged GI/MAGOD

## 2015-04-29 NOTE — Progress Notes (Signed)
Patient intubated by ICU Physician. RT assisted with intubation. Bilateral breath sounds present. CXR confirmed placement. ETCO2 positive color change. RT will continue to monitor. ABG pending.

## 2015-04-29 NOTE — ED Notes (Signed)
Pt vomiting bright red blood, MD notified and EDP at the bedside.

## 2015-04-29 NOTE — ED Provider Notes (Signed)
CSN: [id number]     Arrival date & time 04/29/15  1914 History   First MD Initiated Contact with Patient 04/29/15 1933     Chief Complaint  Patient presents with  . GI Bleeding     (Consider location/radiation/quality/duration/timing/severity/associated sxs/prior Treatment) HPI   Sophia [name] is a(n) 81 y.o. female who presents to the ED with cc of melena. The patient states that Friday (12/9) she had some belly pain and saw her PCP on Friday. She was prescribed levsin and a ppi.  She states that she felt the medications were making her dizzy. Today she got extremely dizzy upon standing, fell forward and hit her head. She has a 3 cm laceration ot hte right forehead.  She fell to the ground and began having what she thought was uncontrollable diarrhea. Upon arrival EMS found the patient covered in Melena and clots. The patient denies a hx of ulcers, bleeding, use of blood thinners. Patient takes 1 baby asa daily and Aleve every other day. She denies alcohol abuse, liver disease.   Past Medical History  Diagnosis Date  . Hypertension   . Hyperlipidemia   . Diabetes mellitus without complication ([place])   . Chronic kidney disease    Past Surgical History  Procedure Laterality Date  . Cholecystectomy    . Sigmoidoscopy     Family History  Problem Relation Age of Onset  . Heart disease Father   . Colon cancer Sister   . Heart disease Brother   . Heart disease Sister    Social History  Substance Use Topics  . Smoking status: Never Smoker   . Smokeless tobacco: Never Used  . Alcohol Use: No   OB History    No data available     Review of Systems  Ten systems reviewed and are negative for acute change, except as noted in the HPI.    Allergies  Review of patient's allergies indicates no known allergies.  Home Medications   Prior to Admission medications   Medication Sig Start Date End Date Taking? Authorizing Provider  aspirin 81 MG tablet Take 81 mg by mouth daily.     Historical Provider, MD  Calcium-Magnesium 500-250 MG TABS Take by mouth daily.    Historical Provider, MD  Cholecalciferol (VITAMIN D-3 PO) Take by mouth. Take 1500 mg daily    Historical Provider, MD  Coenzyme Q10 (CO Q 10) 100 MG CAPS Take by mouth daily.    Historical Provider, MD  hyoscyamine (LEVSIN/SL) 0.125 MG SL tablet Place 1 tablet (0.125 mg total) under the tongue every 4 (four) hours as needed. For colon spasm 04/26/15   Claretta Fraise, MD  levothyroxine (SYNTHROID, LEVOTHROID) 25 MCG tablet TAKE ONE TABLET BY MOUTH ONCE DAILY BEFORE BREAKFAST 11/16/14   Claretta Fraise, MD  Multiple Vitamins-Minerals (MULTIVITAMIN WITH MINERALS) tablet Take 1 tablet by mouth daily.    Historical Provider, MD  Omega 3 1200 MG CAPS Take 1 capsule by mouth daily.    Historical Provider, MD  omeprazole (PRILOSEC) 20 MG capsule Take 1 capsule (20 mg total) by mouth daily. Take on an empty stomach 04/26/15   Claretta Fraise, MD  simvastatin (ZOCOR) 40 MG tablet Take 0.5 tablets (20 mg total) by mouth daily. 09/21/14   Claretta Fraise, MD  valsartan-hydrochlorothiazide (DIOVAN-HCT) 320-25 MG tablet TAKE ONE TABLET BY MOUTH ONCE DAILY 02/26/15   Claretta Fraise, MD   BP 100/47 mmHg  Pulse 91  Temp(Src) 98 F (36.7 C) (Oral)  Resp 22  SpO2  100% Physical Exam  Constitutional: She is oriented to person, place, and time. She appears well-developed and well-nourished. No distress.  Pale, nad.  HENT:  Head: Normocephalic and atraumatic.  Eyes: Conjunctivae are normal. No scleral icterus.  Neck: Normal range of motion.  Cardiovascular: Normal rate, regular rhythm and normal heart sounds.  Exam reveals no gallop and no friction rub.   No murmur heard. Pulmonary/Chest: Effort normal and breath sounds normal. No respiratory distress.  Abdominal: Soft. Bowel sounds are normal. She exhibits no distension and no mass. There is tenderness (LLQ). There is no guarding.  Genitourinary:  Digital Rectal Exam reveals sphincter  with good tone. No external hemorrhoids. No masses or fissures.  Thick, copious, foul smelling melena  Neurological: She is alert and oriented to person, place, and time.  Skin: Skin is warm and dry. She is not diaphoretic.  Nursing note and vitals reviewed.   ED Course  .Critical Care Performed by: Margarita Mail Authorized by: Margarita Mail Total critical care time: 90 minutes Critical care time was exclusive of separately billable procedures and treating other patients. Critical care was necessary to treat or prevent imminent or life-threatening deterioration of the following conditions: circulatory failure and shock. Critical care was time spent personally by me on the following activities: development of treatment plan with patient or surrogate, discussions with consultants, interpretation of cardiac output measurements, evaluation of patient's response to treatment, examination of patient, obtaining history from patient or surrogate, ordering and performing treatments and interventions, ordering and review of laboratory studies, ordering and review of radiographic studies, re-evaluation of patient's condition, pulse oximetry and review of old charts.   (including critical care time) Labs Review Labs Reviewed  COMPREHENSIVE METABOLIC PANEL - Abnormal; Notable for the following:    Chloride 116 (*)    CO2 20 (*)    Glucose, Bld 165 (*)    BUN 44 (*)    Creatinine, Ser 1.44 (*)    Calcium 7.4 (*)    Total Protein 4.6 (*)    Albumin 2.1 (*)    Alkaline Phosphatase 37 (*)    GFR calc non Af Amer 33 (*)    GFR calc Af Amer 38 (*)    All other components within normal limits  CBC - Abnormal; Notable for the following:    WBC 24.6 (*)    RBC 2.04 (*)    Hemoglobin 6.5 (*)    HCT 19.6 (*)    All other components within normal limits  PROTIME-INR - Abnormal; Notable for the following:    Prothrombin Time 18.0 (*)    All other components within normal limits  POC OCCULT BLOOD,  ED - Abnormal; Notable for the following:    Fecal Occult Bld POSITIVE (*)    All other components within normal limits  I-STAT CHEM 8, ED - Abnormal; Notable for the following:    Chloride 114 (*)    BUN 44 (*)    Creatinine, Ser 1.30 (*)    Glucose, Bld 151 (*)    Calcium, Ion 1.08 (*)    Hemoglobin 6.1 (*)    HCT 18.0 (*)    All other components within normal limits  APTT  CBC  BASIC METABOLIC PANEL  MAGNESIUM  PHOSPHORUS  HEMOGLOBIN AND HEMATOCRIT, BLOOD  HEMOGLOBIN A1C  LACTIC ACID, PLASMA  LACTIC ACID, PLASMA  HEMOGLOBIN AND HEMATOCRIT, BLOOD  TYPE AND SCREEN  PREPARE RBC (CROSSMATCH)  ABO/RH  PREPARE RBC (CROSSMATCH)  PREPARE RBC (CROSSMATCH)  PREPARE FRESH FROZEN  PLASMA  PREPARE PLATELET PHERESIS  PREPARE RBC (CROSSMATCH)  PREPARE FRESH FROZEN PLASMA    Imaging Review No results found. I have personally reviewed and evaluated these images and lab results as part of my medical decision-making.   EKG Interpretation None      MDM   Final diagnoses:  Abdominal pain  History of ETT  GI bleed   Patient with active GI bleed. Pressures soft. Obtained consent for transfusion.  8:24 PM  Patient has a 5g drop in her hgb over the past 2 days Patient receiving rapid infusion 3L fluid and her blood pressures continue to soften' She is now actively vomiting red blood and thick clots. I have ordered octreotide # units of blood and 4 untis ahead. Patient seen in shared visit with attending physician.   Patient continually vomiting and stooling voluminous amounts of blood with active massive GI bleed.  I have consulted with PCCM and GI.   9:20 Patient received 4 L of fluid and her pressures have dropped to 0000000 sytolic.  I have ordered Levophed. Will need to bring pressures up for intubation. Patient c/o of feeling extremely tired. She is more somnolent. Increasing tachycardia.   Patient still alert and oriented. Discussed need for intubation with family and  the patient who consents to intubation.   Patient pressures still poor. Endoscopy staff and PCCM present. Patient has already received 4L ,  Patient transferred to the trauma bay for intubation and EGD. PCCM/ GI has assumed care. Patient unstable and critical throughout her ED course. Active massive GI hemorrhage Multiple units of blood, and fluids given. I saw the patient in EGD, she continues to exsanguinate. I have ordered 4 more units of blood, 2 units of FFP and 1 unit of platelets.     Margarita Mail, PA-C 04/30/15 0114  Margarita Mail, PA-C 04/30/15 0115  Margarita Mail, PA-C 04/30/15 [id number]  Sharlett Iles, MD 05/01/15 1346

## 2015-04-30 ENCOUNTER — Encounter (HOSPITAL_COMMUNITY): Payer: Self-pay | Admitting: Gastroenterology

## 2015-04-30 DIAGNOSIS — J9600 Acute respiratory failure, unspecified whether with hypoxia or hypercapnia: Secondary | ICD-10-CM | POA: Diagnosis present

## 2015-04-30 DIAGNOSIS — J9601 Acute respiratory failure with hypoxia: Secondary | ICD-10-CM

## 2015-04-30 DIAGNOSIS — R7989 Other specified abnormal findings of blood chemistry: Secondary | ICD-10-CM

## 2015-04-30 DIAGNOSIS — K264 Chronic or unspecified duodenal ulcer with hemorrhage: Secondary | ICD-10-CM | POA: Diagnosis present

## 2015-04-30 DIAGNOSIS — R578 Other shock: Secondary | ICD-10-CM | POA: Diagnosis present

## 2015-04-30 DIAGNOSIS — E872 Acidosis, unspecified: Secondary | ICD-10-CM | POA: Diagnosis present

## 2015-04-30 DIAGNOSIS — R579 Shock, unspecified: Secondary | ICD-10-CM

## 2015-04-30 DIAGNOSIS — R778 Other specified abnormalities of plasma proteins: Secondary | ICD-10-CM | POA: Diagnosis present

## 2015-04-30 LAB — PROTIME-INR
INR: 3.43 — ABNORMAL HIGH (ref 0.00–1.49)
INR: 1.97 — ABNORMAL HIGH (ref 0.00–1.49)
Prothrombin Time: 33.9 seconds — ABNORMAL HIGH (ref 11.6–15.2)
Prothrombin Time: 22.3 seconds — ABNORMAL HIGH (ref 11.6–15.2)

## 2015-04-30 LAB — BASIC METABOLIC PANEL
Anion gap: 10 (ref 5–15)
Anion gap: 6 (ref 5–15)
BUN: 29 mg/dL — ABNORMAL HIGH (ref 6–20)
BUN: 31 mg/dL — ABNORMAL HIGH (ref 6–20)
CO2: 7 mmol/L — ABNORMAL LOW (ref 22–32)
CO2: 18 mmol/L — ABNORMAL LOW (ref 22–32)
Calcium: 4.9 mg/dL — CL (ref 8.9–10.3)
Calcium: 5.9 mg/dL — CL (ref 8.9–10.3)
Chloride: 126 mmol/L — ABNORMAL HIGH (ref 101–111)
Chloride: 122 mmol/L — ABNORMAL HIGH (ref 101–111)
Creatinine, Ser: 1.37 mg/dL — ABNORMAL HIGH (ref 0.44–1.00)
Creatinine, Ser: 2.07 mg/dL — ABNORMAL HIGH (ref 0.44–1.00)
GFR calc Af Amer: 41 mL/min — ABNORMAL LOW (ref >60)
GFR calc Af Amer: 25 mL/min — ABNORMAL LOW (ref >60)
GFR calc non Af Amer: 35 mL/min — ABNORMAL LOW (ref >60)
GFR calc non Af Amer: 21 mL/min — ABNORMAL LOW (ref >60)
Glucose, Bld: 422 mg/dL — ABNORMAL HIGH (ref 65–99)
Glucose, Bld: 199 mg/dL — ABNORMAL HIGH (ref 65–99)
Potassium: 4.2 mmol/L (ref 3.5–5.1)
Potassium: 4.2 mmol/L (ref 3.5–5.1)
Sodium: 143 mmol/L (ref 135–145)
Sodium: 146 mmol/L — ABNORMAL HIGH (ref 135–145)

## 2015-04-30 LAB — CBC
HCT: 14.4 % — ABNORMAL LOW (ref 36.0–46.0)
HCT: 23.4 % — ABNORMAL LOW (ref 36.0–46.0)
HCT: 21.5 % — ABNORMAL LOW (ref 36.0–46.0)
Hemoglobin: 4.8 g/dL — CL (ref 12.0–15.0)
Hemoglobin: 8.1 g/dL — ABNORMAL LOW (ref 12.0–15.0)
Hemoglobin: 7.4 g/dL — ABNORMAL LOW (ref 12.0–15.0)
MCH: 31.4 pg (ref 26.0–34.0)
MCH: 28 pg (ref 26.0–34.0)
MCH: 27.6 pg (ref 26.0–34.0)
MCHC: 33.3 g/dL (ref 30.0–36.0)
MCHC: 34.6 g/dL (ref 30.0–36.0)
MCHC: 34.4 g/dL (ref 30.0–36.0)
MCV: 94.1 fL (ref 78.0–100.0)
MCV: 81 fL (ref 78.0–100.0)
MCV: 80.2 fL (ref 78.0–100.0)
Platelets: 45 10*3/uL — ABNORMAL LOW (ref 150–400)
Platelets: 127 10*3/uL — ABNORMAL LOW (ref 150–400)
Platelets: 118 10*3/uL — ABNORMAL LOW (ref 150–400)
RBC: 1.53 MIL/uL — ABNORMAL LOW (ref 3.87–5.11)
RBC: 2.89 MIL/uL — ABNORMAL LOW (ref 3.87–5.11)
RBC: 2.68 MIL/uL — ABNORMAL LOW (ref 3.87–5.11)
RDW: 15 % (ref 11.5–15.5)
RDW: 18.1 % — ABNORMAL HIGH (ref 11.5–15.5)
RDW: 18.3 % — ABNORMAL HIGH (ref 11.5–15.5)
WBC: 17.2 10*3/uL — ABNORMAL HIGH (ref 4.0–10.5)
WBC: 18.1 10*3/uL — ABNORMAL HIGH (ref 4.0–10.5)
WBC: 16.9 10*3/uL — ABNORMAL HIGH (ref 4.0–10.5)

## 2015-04-30 LAB — PREPARE FRESH FROZEN PLASMA
Unit division: 0
Unit division: 0

## 2015-04-30 LAB — TROPONIN I
Troponin I: 2.13 ng/mL (ref <0.031)
Troponin I: 3.54 ng/mL (ref <0.031)
Troponin I: 4.13 ng/mL (ref <0.031)

## 2015-04-30 LAB — POCT I-STAT 3, ART BLOOD GAS (G3+)
Acid-base deficit: 10 mmol/L — ABNORMAL HIGH (ref 0.0–2.0)
Bicarbonate: 15.3 mEq/L — ABNORMAL LOW (ref 20.0–24.0)
O2 Saturation: 98 %
Patient temperature: 98.9
TCO2: 16 mmol/L (ref 0–100)
pCO2 arterial: 31.8 mmHg — ABNORMAL LOW (ref 35.0–45.0)
pH, Arterial: 7.29 — ABNORMAL LOW (ref 7.350–7.450)
pO2, Arterial: 122 mmHg — ABNORMAL HIGH (ref 80.0–100.0)

## 2015-04-30 LAB — HEMOGLOBIN AND HEMATOCRIT, BLOOD
HCT: 26.4 % — ABNORMAL LOW (ref 36.0–46.0)
HCT: 24.1 % — ABNORMAL LOW (ref 36.0–46.0)
HCT: 23.8 % — ABNORMAL LOW (ref 36.0–46.0)
Hemoglobin: 8.8 g/dL — ABNORMAL LOW (ref 12.0–15.0)
Hemoglobin: 8.2 g/dL — ABNORMAL LOW (ref 12.0–15.0)
Hemoglobin: 8.1 g/dL — ABNORMAL LOW (ref 12.0–15.0)

## 2015-04-30 LAB — MAGNESIUM: Magnesium: 1.3 mg/dL — ABNORMAL LOW (ref 1.7–2.4)

## 2015-04-30 LAB — PHOSPHORUS: Phosphorus: 3.1 mg/dL (ref 2.5–4.6)

## 2015-04-30 LAB — APTT: aPTT: 38 seconds — ABNORMAL HIGH (ref 24–37)

## 2015-04-30 LAB — PREPARE RBC (CROSSMATCH)

## 2015-04-30 LAB — MRSA PCR SCREENING: MRSA by PCR: NEGATIVE

## 2015-04-30 LAB — LACTIC ACID, PLASMA: Lactic Acid, Venous: 10.3 mmol/L (ref 0.5–2.0)

## 2015-04-30 MED ORDER — CHLORHEXIDINE GLUCONATE 0.12% ORAL RINSE (MEDLINE KIT)
15.0000 mL | Freq: Two times a day (BID) | OROMUCOSAL | Status: DC
  Administered 2015-04-30 – 2015-05-02 (×6): 15 mL via OROMUCOSAL

## 2015-04-30 MED ORDER — MIDAZOLAM HCL 2 MG/2ML IJ SOLN
INTRAMUSCULAR | Status: AC
  Administered 2015-04-30: 1 mg via INTRAVENOUS
  Filled 2015-04-30: qty 4

## 2015-04-30 MED ORDER — ANTISEPTIC ORAL RINSE SOLUTION (CORINZ)
7.0000 mL | Freq: Four times a day (QID) | OROMUCOSAL | Status: DC
  Administered 2015-04-30 – 2015-05-03 (×12): 7 mL via OROMUCOSAL

## 2015-04-30 MED ORDER — SODIUM CHLORIDE 0.9 % IV SOLN
1.0000 g | Freq: Once | INTRAVENOUS | Status: AC
  Administered 2015-04-30: 1 g via INTRAVENOUS
  Filled 2015-04-30: qty 10

## 2015-04-30 MED ORDER — FENTANYL CITRATE (PF) 100 MCG/2ML IJ SOLN
INTRAMUSCULAR | Status: AC
  Administered 2015-04-30: 50 ug via INTRAVENOUS
  Filled 2015-04-30: qty 4

## 2015-04-30 MED ORDER — FENTANYL CITRATE (PF) 100 MCG/2ML IJ SOLN: 50.0000 ug | Freq: Once | INTRAMUSCULAR | Status: DC

## 2015-04-30 MED ORDER — SODIUM BICARBONATE 8.4 % IV SOLN
INTRAVENOUS | Status: DC
  Administered 2015-04-30 – 2015-05-01 (×3): via INTRAVENOUS
  Filled 2015-04-30 (×6): qty 150

## 2015-04-30 MED ORDER — VASOPRESSIN 20 UNIT/ML IV SOLN
0.0300 [IU]/min | INTRAVENOUS | Status: DC
  Administered 2015-04-30: 0.03 [IU]/min via INTRAVENOUS
  Filled 2015-04-30 (×2): qty 2

## 2015-04-30 MED ORDER — SODIUM BICARBONATE 8.4 % IV SOLN
100.0000 meq | Freq: Once | INTRAVENOUS | Status: AC
  Administered 2015-04-30: 100 meq via INTRAVENOUS
  Filled 2015-04-30: qty 100

## 2015-04-30 MED ORDER — MAGNESIUM SULFATE 4 GM/100ML IV SOLN
4.0000 g | Freq: Once | INTRAVENOUS | Status: AC
  Administered 2015-04-30: 4 g via INTRAVENOUS
  Filled 2015-04-30: qty 100

## 2015-04-30 MED ORDER — SODIUM CHLORIDE 0.9 % IV BOLUS (SEPSIS)
1000.0000 mL | Freq: Once | INTRAVENOUS | Status: AC
  Administered 2015-04-30: 1000 mL via INTRAVENOUS

## 2015-04-30 MED ORDER — PHENYLEPHRINE HCL 10 MG/ML IJ SOLN
30.0000 ug/min | INTRAVENOUS | Status: DC
  Filled 2015-04-30: qty 1

## 2015-04-30 MED ORDER — SODIUM CHLORIDE 0.9 % IV SOLN: Freq: Once | INTRAVENOUS | Status: DC

## 2015-04-30 MED ORDER — IOHEXOL 300 MG/ML  SOLN
100.0000 mL | Freq: Once | INTRAMUSCULAR | Status: AC | PRN
  Administered 2015-04-30: 50 mL via INTRA_ARTERIAL

## 2015-04-30 MED ORDER — MIDAZOLAM HCL 2 MG/2ML IJ SOLN: 1.0000 mg | Freq: Once | INTRAMUSCULAR | Status: DC

## 2015-04-30 NOTE — Progress Notes (Addendum)
eLink Physician-Brief Progress Note Patient Name: Sophia [name] DOB: [birth year] MRN: [id number]  Gi bleed  S/p coiling GDA in IR . Now in ICU  S: RN says in refractory shock with levophed 50 mcg. Hgb 4.8gm%, s/p 6 unit prbc  O Camera exam - blood i og.  On vent Somewhat dysncrhonous  LABS PULMONARY  Recent Labs Lab 04/29/15 2012  TCO2 18    CBC  Recent Labs Lab 04/26/15 1539 04/29/15 1930 04/29/15 2012 04/30/15 0250  HGB  --  6.5* 6.1* 4.8*  HCT 32.9* 19.6* 18.0* 14.4*  WBC 10.2 24.6*  --  17.2*  PLT  --  326  --  45*    COAGULATION  Recent Labs Lab 04/29/15 1930 04/30/15 0250  INR 1.48 3.43*    CARDIAC  No results for input(s): TROPONINI in the last 168 hours. No results for input(s): PROBNP in the last 168 hours.   CHEMISTRY  Recent Labs Lab 04/26/15 1539 04/29/15 1930 04/29/15 2012 04/30/15 0250  NA 142 144 145 143  K 4.2 4.0 3.9 4.2  CL 101 116* 114* 126*  CO2 24 20*  --  7*  GLUCOSE 150* 165* 151* 422*  BUN 29* 44* 44* 29*  CREATININE 1.35* 1.44* 1.30* 1.37*  CALCIUM 9.5 7.4*  --  4.9*  MG  --   --   --  1.3*  PHOS  --   --   --  3.1   Estimated Creatinine Clearance: 27.8 mL/min (by C-G formula based on Cr of 1.37).   LIVER  Recent Labs Lab 04/26/15 1539 04/29/15 1930 04/30/15 0250  AST 20 24  --   ALT 12 15  --   ALKPHOS 65 37*  --   BILITOT 0.4 0.3  --   PROT 6.7 4.6*  --   ALBUMIN 3.6 2.1*  --   INR  --  1.48 3.43*     INFECTIOUS  Recent Labs Lab 04/30/15 0249  LATICACIDVEN 10.3*     A) Active Hemorrhagic shock with coagulopathy - and lactic acidosis s/p massive transfusion and s/p coiling but still seems to be bleeding Hypomagnesemia Hypocalcemia  P 2 more unit prbc + FFP + Platelet Bic bolus + gtt Mag replacment Calcium replacment  Dr Barry Dienes [facility] called for emergent CCS consideration -0 she will see patietnn  Called husband Gwyndolyn Saxon [phone] - wnt to voice mail. Did not leave message. RN  got hold fo family - DPOA husband but he is HOH and I spoke to daughter ? [name] on phone - she seemed in shock on hearing news so could not talk about code status  Dire prognosis short of surgical itnervention or spontaneous resolution - CPR will be ineffective if she arrests due to hemorrhage   Chaplain consult called   Intervention Category Major Interventions: Hemorrhage - evaluation and management  Jessicaann Overbaugh 04/30/2015, 3:31 AM

## 2015-04-30 NOTE — Progress Notes (Signed)
Patient ID: Sophia [name], female   DOB: 1931/12/02, 81 y.o.   MRN: [id number] [facility] Progress Note  Sophia [name] 81 y.o. [birth year]    Persistent bleeding from OG tube. Hgb 4.8. S/P embolization of GDA yesterday. Intubated.  Objective: Vital signs in last 24 hours: Filed Vitals:   04/30/15 0800 04/30/15 0814  BP: 96/78   Pulse: 115   Temp:  98 F (36.7 C)  Resp: 21     Physical Exam: Gen: lethargic, intubated [id number]  Chest: Coarse breath sounds Abd: Distended, facial grimace to palpation, decreased bowel sounds Skin: no rash  Lab Results:  Recent Labs  04/29/15 1930 04/29/15 2012 04/30/15 0250  NA 144 145 143  K 4.0 3.9 4.2  CL 116* 114* 126*  CO2 20*  --  7*  GLUCOSE 165* 151* 422*  BUN 44* 44* 29*  CREATININE 1.44* 1.30* 1.37*  CALCIUM 7.4*  --  4.9*  MG  --   --  1.3*  PHOS  --   --  3.1    Recent Labs  04/29/15 1930  AST 24  ALT 15  ALKPHOS 37*  BILITOT 0.3  PROT 4.6*  ALBUMIN 2.1*    Recent Labs  04/29/15 1930 04/29/15 2012 04/30/15 0250  WBC 24.6*  --  17.2*  HGB 6.5* 6.1* 4.8*  HCT 19.6* 18.0* 14.4*  MCV 96.1  --  94.1  PLT 326  --  45*    Recent Labs  04/29/15 1930 04/30/15 0250  LABPROT 18.0* 33.9*  INR 1.48 3.43*      Assessment/Plan: S/P bleeding duodenal ulcer with embolization of GDA yesterday following EGD with ongoing bleeding. Hgb 4.8. Aggressive volume resuscitation. If rebleeds, then needs surgery. Continue Protonix drip. Supportive care.   [name]. 04/30/2015, 8:45 AM  Pager [phone]  If no answer or after 5 PM call [phone]

## 2015-04-30 NOTE — Progress Notes (Signed)
Patient ID: Sophia [name], female   DOB: [birth year], 81 y.o.   MRN: [id number]  Follow up Hgb 8.2 which is stable from 8.8 over three hours ago.  I don't think she is actively bleeding.  Will follow closely. Elevated troponin worriesome

## 2015-04-30 NOTE — Progress Notes (Signed)
[facility] Progress Note Patient Name: Sophia [name] DOB: [birth year] MRN: [id number]   Date of Service  04/30/2015  HPI/Events of Note  Multiple issues: 1. Oliguria - urine output 120 mL for last shift and 2. Hgb = 7.4. BP = 123/74. CVP = 6.  eICU Interventions  Will order: 1. 0.9 NaCl 1 liter IV over 1 hour now.      Intervention Category Intermediate Interventions: Oliguria - evaluation and management;Other:  Lysle Dingwall 04/30/2015, 9:37 PM

## 2015-04-30 NOTE — Progress Notes (Signed)
PULMONARY / CRITICAL CARE MEDICINE   Name: Sophia [name] MRN: [id number] DOB: [birth year]    ADMISSION DATE:  04/29/2015 CONSULTATION DATE:  04/29/15  REFERRING MD:  EDP  CHIEF COMPLAINT:  GI bleed  HISTORY OF PRESENT ILLNESS:   Sophia [name] is an 81 y.o. F with PMH as outlined below.  She was brought to [facility] ED 12/12 due to near syncopal episode along with melena.  She did fall and suffered a laceration to her forehead.    On arrival to ED, she was apparently covered in melena and blood.  Once in ED stretcher, she began to have active hematemesis.  SBP was in the 70's and Hgb 6.5 (was 11.0 just 3 days prior).  She was given 4L IVF and had 4u PRBC ordered.  GI was called and planning for bedside endoscopy in ED.  PCCM called for admission.  SUBJECTIVE:   Continues to pass bloody stool.  IR embolized multiple areas that were bleeding overnight.Marland Kitchen  VITAL SIGNS: BP 104/65 mmHg  Pulse 129  Temp(Src) 99.1 F (37.3 C) (Oral)  Resp 6  Wt 69 kg (152 lb 1.9 oz)  SpO2 100%  HEMODYNAMICS: CVP:  [10 mmHg-30 mmHg] 12 mmHg  VENTILATOR SETTINGS: Vent Mode:  [-] PSV;CPAP FiO2 (%):  [40 %-60 %] 40 % Set Rate:  [20 bmp] 20 bmp Vt Set:  [440 mL] 440 mL PEEP:  [5 cmH20] 5 cmH20 Pressure Support:  [5 cmH20] 5 cmH20 Plateau Pressure:  [15 cmH20] 15 cmH20  INTAKE / OUTPUT: I/O last 3 completed shifts: In: 10132.8 [I.V.:6855.8; Blood:1147; NG/GT:2030; IV Piggyback:100] Out: 1995 [Urine:45; Emesis/NG output:1950]   PHYSICAL EXAMINATION: General: Elderly female, in NAD, sedated and intubated. Neuro: A&O x 3, non-focal.  HEENT: [place]/AT. PERRL, sclerae anicteric. Cardiovascular: Tachy, regular, no M/R/G.  Lungs: Respirations even and unlabored.  Coarse BS diffusely. Abdomen: BS x 4, soft, NT/ND.  Musculoskeletal: No gross deformities, no edema.  Skin: Pale, warm, no rashes.  LABS:  BMET  Recent Labs Lab 04/26/15 1539 04/29/15 1930 04/29/15 2012 04/30/15 0250  NA 142 144  145 143  K 4.2 4.0 3.9 4.2  CL 101 116* 114* 126*  CO2 24 20*  --  7*  BUN 29* 44* 44* 29*  CREATININE 1.35* 1.44* 1.30* 1.37*  GLUCOSE 150* 165* 151* 422*   Electrolytes  Recent Labs Lab 04/26/15 1539 04/29/15 1930 04/30/15 0250  CALCIUM 9.5 7.4* 4.9*  MG  --   --  1.3*  PHOS  --   --  3.1   CBC  Recent Labs Lab 04/26/15 1539 04/29/15 1930  04/30/15 0250 04/30/15 1030 04/30/15 1245  WBC 10.2 24.6*  --  17.2*  --   --   HGB  --  6.5*  < > 4.8* 8.8* 8.2*  HCT 32.9* 19.6*  < > 14.4* 26.4* 24.1*  PLT  --  326  --  45*  --   --   < > = values in this interval not displayed.  Coag's  Recent Labs Lab 04/29/15 1930 04/30/15 0250  APTT 30  --   INR 1.48 3.43*   Sepsis Markers  Recent Labs Lab 04/30/15 0249  LATICACIDVEN 10.3*   ABG No results for input(s): PHART, PCO2ART, PO2ART in the last 168 hours.  Liver Enzymes  Recent Labs Lab 04/26/15 1539 04/29/15 1930  AST 20 24  ALT 12 15  ALKPHOS 65 37*  BILITOT 0.4 0.3  ALBUMIN 3.6 2.1*   Cardiac Enzymes  Recent Labs Lab 04/30/15  1123  TROPONINI 2.13*   Glucose No results for input(s): GLUCAP in the last 168 hours.  Imaging Ir Angiogram Visceral Selective  04/30/2015  CLINICAL DATA:  Large volume upper GI bleed. Endoscopy revealed probable duodenal ulcer has active source, incompletely controlled. EXAM: IR EMBO ART VEN HEMORR LYMPH EXTRAV INC GUIDE ROADMAPPING; IR ULTRASOUND GUIDANCE VASC ACCESS LEFT; ARTERIOGRAPHY; SELECTIVE VISCERAL ARTERIOGRAPHY; ADDITIONAL ARTERIOGRAPHY ANESTHESIA/SEDATION: Intravenous Fentanyl and Versed had been administered for previous procedure. No additional medications were administered. Radiology RN provided continuous cardiorespiratory monitoring. MEDICATIONS: Lidocaine 1% subcutaneous 5 mL CONTRAST:  70mL OMNIPAQUE IOHEXOL 300 MG/ML  SOLN PROCEDURE: The procedure, risks (including but not limited to bleeding, infection, organ damage ), benefits, and alternatives were  explained to the spouse and daughter. Questions regarding the procedure were encouraged and answered. The family understands and consents to the procedure. Because of a indwelling right femoral venous catheter, a left-sided femoral approach was selected. Site was marked, prepped with Betadine, draped in usual sterile fashion, infiltrated locally with 1% lidocaine. Under real-time ultrasound guidance, the left femoral artery was accessed with a 21-gauge micropuncture needle, exchanged over a 018 guidewire for a transitional dilator, through which a 035 guidewire was advanced. A 5 French vascular sheath was placed. Through this a 5 French C2 catheter was coaxial advanced in used to selectively catheterize the celiac axis for selective arteriography. An angled Glidewire was advanced into the gastroduodenal artery but the C2 catheter could not be adequately advanced distally. A coaxial renegade micro catheter with transient guidewire was then advanced through the C2 and used to selectively catheterize the gastroduodenal artery. Coil embolization of the GDA performed with 3-5 mm coils the cessation of flow. After final arteriography through the C2 catheter, the catheter and sheath were removed and hemostasis achieved with the aid of the Exoseal device after confirmatory femoral arteriography. The patient tolerated the procedure well. COMPLICATIONS: None immediate FINDINGS: Brisk bleeding from a distal branch of the gastroduodenal artery was evident on the initial arteriogram, into what is probably the lumen of the duodenum. The vessel was selectively catheterized and coil embolization performed until cessation of flow through the treated segment and no further extravasation evident. There is elsewhere diffuse arterial vasospasm. No other pathologic regions identified. IMPRESSION: 1. Brisk active arterial extravasation from a branch of the gastroduodenal artery probably into the duodenal lumen. 2. Technically successful  coil embolization of the gastroduodenal artery with cessation of extravasation. Electronically Signed   By: Lucrezia Europe M.D.   On: 04/30/2015 12:08   Ir Angiogram Selective Each Additional Vessel  04/30/2015  CLINICAL DATA:  Large volume upper GI bleed. Endoscopy revealed probable duodenal ulcer has active source, incompletely controlled. EXAM: IR EMBO ART VEN HEMORR LYMPH EXTRAV INC GUIDE ROADMAPPING; IR ULTRASOUND GUIDANCE VASC ACCESS LEFT; ARTERIOGRAPHY; SELECTIVE VISCERAL ARTERIOGRAPHY; ADDITIONAL ARTERIOGRAPHY ANESTHESIA/SEDATION: Intravenous Fentanyl and Versed had been administered for previous procedure. No additional medications were administered. Radiology RN provided continuous cardiorespiratory monitoring. MEDICATIONS: Lidocaine 1% subcutaneous 5 mL CONTRAST:  18mL OMNIPAQUE IOHEXOL 300 MG/ML  SOLN PROCEDURE: The procedure, risks (including but not limited to bleeding, infection, organ damage ), benefits, and alternatives were explained to the spouse and daughter. Questions regarding the procedure were encouraged and answered. The family understands and consents to the procedure. Because of a indwelling right femoral venous catheter, a left-sided femoral approach was selected. Site was marked, prepped with Betadine, draped in usual sterile fashion, infiltrated locally with 1% lidocaine. Under real-time ultrasound guidance, the left femoral artery was accessed with  a 21-gauge micropuncture needle, exchanged over a 018 guidewire for a transitional dilator, through which a 035 guidewire was advanced. A 5 French vascular sheath was placed. Through this a 5 French C2 catheter was coaxial advanced in used to selectively catheterize the celiac axis for selective arteriography. An angled Glidewire was advanced into the gastroduodenal artery but the C2 catheter could not be adequately advanced distally. A coaxial renegade micro catheter with transient guidewire was then advanced through the C2 and used to  selectively catheterize the gastroduodenal artery. Coil embolization of the GDA performed with 3-5 mm coils the cessation of flow. After final arteriography through the C2 catheter, the catheter and sheath were removed and hemostasis achieved with the aid of the Exoseal device after confirmatory femoral arteriography. The patient tolerated the procedure well. COMPLICATIONS: None immediate FINDINGS: Brisk bleeding from a distal branch of the gastroduodenal artery was evident on the initial arteriogram, into what is probably the lumen of the duodenum. The vessel was selectively catheterized and coil embolization performed until cessation of flow through the treated segment and no further extravasation evident. There is elsewhere diffuse arterial vasospasm. No other pathologic regions identified. IMPRESSION: 1. Brisk active arterial extravasation from a branch of the gastroduodenal artery probably into the duodenal lumen. 2. Technically successful coil embolization of the gastroduodenal artery with cessation of extravasation. Electronically Signed   By: Lucrezia Europe M.D.   On: 04/30/2015 12:08   Ir Angiogram Follow Up Study  04/30/2015  CLINICAL DATA:  Large volume upper GI bleed. Endoscopy revealed probable duodenal ulcer has active source, incompletely controlled. EXAM: IR EMBO ART VEN HEMORR LYMPH EXTRAV INC GUIDE ROADMAPPING; IR ULTRASOUND GUIDANCE VASC ACCESS LEFT; ARTERIOGRAPHY; SELECTIVE VISCERAL ARTERIOGRAPHY; ADDITIONAL ARTERIOGRAPHY ANESTHESIA/SEDATION: Intravenous Fentanyl and Versed had been administered for previous procedure. No additional medications were administered. Radiology RN provided continuous cardiorespiratory monitoring. MEDICATIONS: Lidocaine 1% subcutaneous 5 mL CONTRAST:  11mL OMNIPAQUE IOHEXOL 300 MG/ML  SOLN PROCEDURE: The procedure, risks (including but not limited to bleeding, infection, organ damage ), benefits, and alternatives were explained to the spouse and daughter. Questions  regarding the procedure were encouraged and answered. The family understands and consents to the procedure. Because of a indwelling right femoral venous catheter, a left-sided femoral approach was selected. Site was marked, prepped with Betadine, draped in usual sterile fashion, infiltrated locally with 1% lidocaine. Under real-time ultrasound guidance, the left femoral artery was accessed with a 21-gauge micropuncture needle, exchanged over a 018 guidewire for a transitional dilator, through which a 035 guidewire was advanced. A 5 French vascular sheath was placed. Through this a 5 French C2 catheter was coaxial advanced in used to selectively catheterize the celiac axis for selective arteriography. An angled Glidewire was advanced into the gastroduodenal artery but the C2 catheter could not be adequately advanced distally. A coaxial renegade micro catheter with transient guidewire was then advanced through the C2 and used to selectively catheterize the gastroduodenal artery. Coil embolization of the GDA performed with 3-5 mm coils the cessation of flow. After final arteriography through the C2 catheter, the catheter and sheath were removed and hemostasis achieved with the aid of the Exoseal device after confirmatory femoral arteriography. The patient tolerated the procedure well. COMPLICATIONS: None immediate FINDINGS: Brisk bleeding from a distal branch of the gastroduodenal artery was evident on the initial arteriogram, into what is probably the lumen of the duodenum. The vessel was selectively catheterized and coil embolization performed until cessation of flow through the treated segment and no further extravasation  evident. There is elsewhere diffuse arterial vasospasm. No other pathologic regions identified. IMPRESSION: 1. Brisk active arterial extravasation from a branch of the gastroduodenal artery probably into the duodenal lumen. 2. Technically successful coil embolization of the gastroduodenal artery  with cessation of extravasation. Electronically Signed   By: Lucrezia Europe M.D.   On: 04/30/2015 12:08   Ir US Guide Vasc Access Left  04/30/2015  CLINICAL DATA:  Large volume upper GI bleed. Endoscopy revealed probable duodenal ulcer has active source, incompletely controlled. EXAM: IR EMBO ART VEN HEMORR LYMPH EXTRAV INC GUIDE ROADMAPPING; IR ULTRASOUND GUIDANCE VASC ACCESS LEFT; ARTERIOGRAPHY; SELECTIVE VISCERAL ARTERIOGRAPHY; ADDITIONAL ARTERIOGRAPHY ANESTHESIA/SEDATION: Intravenous Fentanyl and Versed had been administered for previous procedure. No additional medications were administered. Radiology RN provided continuous cardiorespiratory monitoring. MEDICATIONS: Lidocaine 1% subcutaneous 5 mL CONTRAST:  76mL OMNIPAQUE IOHEXOL 300 MG/ML  SOLN PROCEDURE: The procedure, risks (including but not limited to bleeding, infection, organ damage ), benefits, and alternatives were explained to the spouse and daughter. Questions regarding the procedure were encouraged and answered. The family understands and consents to the procedure. Because of a indwelling right femoral venous catheter, a left-sided femoral approach was selected. Site was marked, prepped with Betadine, draped in usual sterile fashion, infiltrated locally with 1% lidocaine. Under real-time ultrasound guidance, the left femoral artery was accessed with a 21-gauge micropuncture needle, exchanged over a 018 guidewire for a transitional dilator, through which a 035 guidewire was advanced. A 5 French vascular sheath was placed. Through this a 5 French C2 catheter was coaxial advanced in used to selectively catheterize the celiac axis for selective arteriography. An angled Glidewire was advanced into the gastroduodenal artery but the C2 catheter could not be adequately advanced distally. A coaxial renegade micro catheter with transient guidewire was then advanced through the C2 and used to selectively catheterize the gastroduodenal artery. Coil embolization  of the GDA performed with 3-5 mm coils the cessation of flow. After final arteriography through the C2 catheter, the catheter and sheath were removed and hemostasis achieved with the aid of the Exoseal device after confirmatory femoral arteriography. The patient tolerated the procedure well. COMPLICATIONS: None immediate FINDINGS: Brisk bleeding from a distal branch of the gastroduodenal artery was evident on the initial arteriogram, into what is probably the lumen of the duodenum. The vessel was selectively catheterized and coil embolization performed until cessation of flow through the treated segment and no further extravasation evident. There is elsewhere diffuse arterial vasospasm. No other pathologic regions identified. IMPRESSION: 1. Brisk active arterial extravasation from a branch of the gastroduodenal artery probably into the duodenal lumen. 2. Technically successful coil embolization of the gastroduodenal artery with cessation of extravasation. Electronically Signed   By: Lucrezia Europe M.D.   On: 04/30/2015 12:08   Dg Chest Port 1 View  04/29/2015  CLINICAL DATA:  Post intubation. EXAM: PORTABLE CHEST 1 VIEW COMPARISON:  04/29/2015 FINDINGS: Interval placement of an endotracheal tube with tip measuring 2.9 cm above the carina. Shallow inspiration. Normal heart size and pulmonary vascularity. Lungs appear clear. No blunting of costophrenic angles. No pneumothorax. Tortuous aorta. Surgical clips in the right upper quadrant. IMPRESSION: Endotracheal tube tip measures 2.9 cm above the carina. Shallow inspiration. No evidence of active pulmonary disease. Electronically Signed   By: Lucienne Capers M.D.   On: 04/29/2015 22:52   Dg Abd Portable 1v  04/29/2015  CLINICAL DATA:  81 year old female with bloody stool and vomiting blood EXAM: PORTABLE ABDOMEN - 1 VIEW COMPARISON:  None. FINDINGS: Single  upright radiograph demonstrates gas within the stomach. No free air identified. External defibrillator pads  project over the left chest and right upper quadrant. The heart is likely mildly enlarged. The lungs appear clear. No acute osseous abnormality. IMPRESSION: 1. No free air visualized. 2. Mild cardiomegaly. 3. The lungs appear clear. Electronically Signed   By: Jacqulynn Cadet M.D.   On: 04/29/2015 21:43   [place] Guide Roadmapping  04/30/2015  CLINICAL DATA:  Large volume upper GI bleed. Endoscopy revealed probable duodenal ulcer has active source, incompletely controlled. EXAM: IR EMBO ART VEN HEMORR LYMPH EXTRAV INC GUIDE ROADMAPPING; IR ULTRASOUND GUIDANCE VASC ACCESS LEFT; ARTERIOGRAPHY; SELECTIVE VISCERAL ARTERIOGRAPHY; ADDITIONAL ARTERIOGRAPHY ANESTHESIA/SEDATION: Intravenous Fentanyl and Versed had been administered for previous procedure. No additional medications were administered. Radiology RN provided continuous cardiorespiratory monitoring. MEDICATIONS: Lidocaine 1% subcutaneous 5 mL CONTRAST:  49mL OMNIPAQUE IOHEXOL 300 MG/ML  SOLN PROCEDURE: The procedure, risks (including but not limited to bleeding, infection, organ damage ), benefits, and alternatives were explained to the spouse and daughter. Questions regarding the procedure were encouraged and answered. The family understands and consents to the procedure. Because of a indwelling right femoral venous catheter, a left-sided femoral approach was selected. Site was marked, prepped with Betadine, draped in usual sterile fashion, infiltrated locally with 1% lidocaine. Under real-time ultrasound guidance, the left femoral artery was accessed with a 21-gauge micropuncture needle, exchanged over a 018 guidewire for a transitional dilator, through which a 035 guidewire was advanced. A 5 French vascular sheath was placed. Through this a 5 French C2 catheter was coaxial advanced in used to selectively catheterize the celiac axis for selective arteriography. An angled Glidewire was advanced into the gastroduodenal  artery but the C2 catheter could not be adequately advanced distally. A coaxial renegade micro catheter with transient guidewire was then advanced through the C2 and used to selectively catheterize the gastroduodenal artery. Coil embolization of the GDA performed with 3-5 mm coils the cessation of flow. After final arteriography through the C2 catheter, the catheter and sheath were removed and hemostasis achieved with the aid of the Exoseal device after confirmatory femoral arteriography. The patient tolerated the procedure well. COMPLICATIONS: None immediate FINDINGS: Brisk bleeding from a distal branch of the gastroduodenal artery was evident on the initial arteriogram, into what is probably the lumen of the duodenum. The vessel was selectively catheterized and coil embolization performed until cessation of flow through the treated segment and no further extravasation evident. There is elsewhere diffuse arterial vasospasm. No other pathologic regions identified. IMPRESSION: 1. Brisk active arterial extravasation from a branch of the gastroduodenal artery probably into the duodenal lumen. 2. Technically successful coil embolization of the gastroduodenal artery with cessation of extravasation. Electronically Signed   By: Lucrezia Europe M.D.   On: 04/30/2015 12:08     STUDIES:  AXR 12/12 > no free air. CXR 12/12 >  CULTURES: None  ANTIBIOTICS: None  SIGNIFICANT EVENTS: 12/12 > admitted with UGIB  LINES/TUBES: ETT 12/12 > R femoral 12/12 >  ASSESSMENT / PLAN:  GASTROINTESTINAL A:   UGIB of unclear etiology. GI prophylaxis. Nutrition. S/P IR procedure. P:   - Embolized via IR. - Surgery following, no surgical interventions for now. - Repeat CBC noted. - NPO.  HEMATOLOGIC A:   Acute blood loss anemia. VTE Prophylaxis. P:  H/H q6hrs. Transfuse for Hgb < 7. SCD's only. CBC in AM.  PULMONARY A: VDRF due to inability to protect airway. P:  Full vent support. Wean as able. ABG  now. ABG and CXR in AM.  CARDIOVASCULAR A:  Shock - presumed hemorrhagic due to UGIB. Hx HTN, HLD. Troponin 2.13 with EKG with no evidence of ST segment elevations, likely demand ischemia. P:  Levophed as needed for goal MAP > 65. Vasopressin 0.03. Lactate of 10.3. Hold outpatient ASA, simvastatin, diovan. Will likely need to place a TLC for CVP. If next troponin is higher then will need cardiology consulted.  RENAL A:   CKD. Metabolic acidosis mixed gap/non-gap. Hypocalcemia.  P:   ABG now. Increase bicarb drip to 100 ml/hr. D/C NS patient already has hyperchloremic metabolic acidosis. BMP in AM. Replace electrolytes as indicated.  INFECTIOUS A:   No indication of infection. P:   Monitor clinically.  ENDOCRINE A:   DM - not on outpatient meds.   Hypothyroidism. P:   Assess Hgb A1c. Continue outpatient synthroid, change to IV formulation.  NEUROLOGIC A:   Acute metabolic encephalopathy due to sedation. P:   Sedation:  Fentanyl gtt / Midazolam PRN. RASS goal: 0 to -1. Daily WUA.  Family updated: No family bedside.  Interdisciplinary Family Meeting v Palliative Care Meeting:  Due by: 12/18.  The patient is critically ill with multiple organ systems failure and requires high complexity decision making for assessment and support, frequent evaluation and titration of therapies, application of advanced monitoring technologies and extensive interpretation of multiple databases.   Critical Care Time devoted to patient care services described in this note is  35  Minutes. This time reflects time of care of this signee Dr Jennet Maduro. This critical care time does not reflect procedure time, or teaching time or supervisory time of PA/NP/Med student/Med Resident etc but could involve care discussion time.  Rush Farmer, M.D. [facility] Pulmonary/Critical Care Medicine. Pager: [phone]. After hours pager: [phone].

## 2015-04-30 NOTE — Progress Notes (Signed)
1251 H&H resulted 8.2 and 24.1 .Dr Rush Farmer informed of results

## 2015-04-30 NOTE — Progress Notes (Signed)
Patient ID: Sophia [name], female   DOB: Jul 05, 1931, 81 y.o.   MRN: [id number]  Called by nurse stating that patient is passing large amounts of bloody stools. Next step is surgery and I informed Dr. Ninfa Linden of her continued bleeding.

## 2015-04-30 NOTE — Progress Notes (Signed)
Patient transported to IR from ED with no complications. RT will continue to monitor.

## 2015-04-30 NOTE — Progress Notes (Signed)
Patient with large bloody stools bright red blood and some clots repeat h&h 8.8 and 26  Heart rate 120.Richardson Landry Minor CCM NP and Dr. Michail Sermon with GI both informed

## 2015-04-30 NOTE — Progress Notes (Signed)
CRITICAL VALUE ALERT  Critical value received:  Troponin 3.54 calcium 5.9  Date of notification:  12/13  Time of notification:  1620  Critical value read back:Yes.    Nurse who received alert:  Loma Newton  MD notified (1st page):  Dr Nelda Marseille  Time of first page:  1622  MD notified (2nd page):  Time of second page:  Responding MD:  Dr Nelda Marseille  Time MD responded:  [phone]

## 2015-04-30 NOTE — Sedation Documentation (Signed)
Patient is resting comfortably. 

## 2015-04-30 NOTE — Consult Note (Signed)
Chief Complaint: Patient was seen in consultation today for  Chief Complaint  Patient presents with  . GI Bleeding   at the request of Dr Lamonte Sakai  Referring Physician(s): Byrum/Magod  History of Present Illness: Sophia [name] is a 81 y.o. female to ED today for GI bleed, melena, hematemesis. Endoscopy localized bleeding to duodenal ulcer, could not control.  Past Medical History  Diagnosis Date  . Hypertension   . Hyperlipidemia   . Diabetes mellitus without complication ([place])   . Chronic kidney disease     Past Surgical History  Procedure Laterality Date  . Cholecystectomy    . Sigmoidoscopy      Allergies: Levothyroxine  Medications: Prior to Admission medications   Medication Sig Start Date End Date Taking? Authorizing Provider  aspirin 81 MG tablet Take 81 mg by mouth daily.   Yes Historical Provider, MD  Calcium-Magnesium 500-250 MG TABS Take 1 tablet by mouth daily as needed (takes once in while).    Yes Historical Provider, MD  Coenzyme Q10 (CO Q 10) 100 MG CAPS Take 100 mg by mouth daily as needed (takes once in while).    Yes Historical Provider, MD  hyoscyamine (LEVSIN/SL) 0.125 MG SL tablet Place 1 tablet (0.125 mg total) under the tongue every 4 (four) hours as needed. For colon spasm 04/26/15  Yes Claretta Fraise, MD  Multiple Vitamins-Minerals (MULTIVITAMIN WITH MINERALS) tablet Take 1 tablet by mouth daily.   Yes Historical Provider, MD  neomycin-polymyxin b-dexamethasone (MAXITROL) 3.5-10000-0.1 OINT Place 1 application into the right eye at bedtime.  04/09/15  Yes Historical Provider, MD  Omega 3 1200 MG CAPS Take 1,200 mg by mouth daily as needed (takes once in while).    Yes Historical Provider, MD  omeprazole (PRILOSEC) 20 MG capsule Take 1 capsule (20 mg total) by mouth daily. Take on an empty stomach 04/26/15  Yes Claretta Fraise, MD  simvastatin (ZOCOR) 40 MG tablet Take 0.5 tablets (20 mg total) by mouth daily. 09/21/14  Yes Claretta Fraise, MD    valsartan-hydrochlorothiazide (DIOVAN-HCT) 320-25 MG tablet TAKE ONE TABLET BY MOUTH ONCE DAILY 02/26/15  Yes Claretta Fraise, MD  levothyroxine (SYNTHROID, LEVOTHROID) 25 MCG tablet TAKE ONE TABLET BY MOUTH ONCE DAILY BEFORE BREAKFAST 11/16/14   Claretta Fraise, MD     Family History  Problem Relation Age of Onset  . Heart disease Father   . Colon cancer Sister   . Heart disease Brother   . Heart disease Sister     Social History   Social History  . Marital Status: Married    Spouse Name: N/A  . Number of Children: N/A  . Years of Education: N/A   Social History Main Topics  . Smoking status: Never Smoker   . Smokeless tobacco: Never Used  . Alcohol Use: No  . Drug Use: No  . Sexual Activity: Not Asked   Other Topics Concern  . None   Social History Narrative    ECOG Status: 4 - Bedbound  Review of Systems: A 12 point ROS discussed and pertinent positives are indicated in the HPI above.  All other systems are negative.  Review of Systems  Vital Signs: BP 94/58 mmHg  Pulse 120  Temp(Src) 98 F (36.7 C) (Oral)  Resp 25  SpO2 100%  Physical Exam  Mallampati Score:  MD Evaluation Airway: WNL Heart: WNL Abdomen: WNL Chest/ Lungs: WNL ASA  Classification: 3 Mallampati/Airway Score: Two  Imaging: Dg Chest Port 1 View  04/29/2015  CLINICAL DATA:  Post intubation. EXAM: PORTABLE CHEST 1 VIEW COMPARISON:  04/29/2015 FINDINGS: Interval placement of an endotracheal tube with tip measuring 2.9 cm above the carina. Shallow inspiration. Normal heart size and pulmonary vascularity. Lungs appear clear. No blunting of costophrenic angles. No pneumothorax. Tortuous aorta. Surgical clips in the right upper quadrant. IMPRESSION: Endotracheal tube tip measures 2.9 cm above the carina. Shallow inspiration. No evidence of active pulmonary disease. Electronically Signed   By: Lucienne Capers M.D.   On: 04/29/2015 22:52   Dg Abd Portable 1v  04/29/2015  CLINICAL DATA:   81 year old female with bloody stool and vomiting blood EXAM: PORTABLE ABDOMEN - 1 VIEW COMPARISON:  None. FINDINGS: Single upright radiograph demonstrates gas within the stomach. No free air identified. External defibrillator pads project over the left chest and right upper quadrant. The heart is likely mildly enlarged. The lungs appear clear. No acute osseous abnormality. IMPRESSION: 1. No free air visualized. 2. Mild cardiomegaly. 3. The lungs appear clear. Electronically Signed   By: Jacqulynn Cadet M.D.   On: 04/29/2015 21:43    Labs:  CBC:  Recent Labs  09/17/14 1249 11/26/14 0949 04/26/15 1539 04/29/15 1930 04/29/15 2012  WBC 5.3 6.5 10.2 24.6*  --   HGB 13.0 11.2*  --  6.5* 6.1*  HCT 41.5 35.5* 32.9* 19.6* 18.0*  PLT  --   --   --  326  --     COAGS:  Recent Labs  04/29/15 1930  INR 1.48  APTT 30    BMP:  Recent Labs  09/17/14 1217 11/26/14 0942 04/26/15 1539 04/29/15 1930 04/29/15 2012  NA 145* 139 142 144 145  K 4.2 4.2 4.2 4.0 3.9  CL 104 104 101 116* 114*  CO2 23 20 24  20*  --   GLUCOSE 99 98 150* 165* 151*  BUN 17 26 29* 44* 44*  CALCIUM 9.8 9.3 9.5 7.4*  --   CREATININE 1.46* 1.34* 1.35* 1.44* 1.30*  GFRNONAA 34* 37* 37* 33*  --   GFRAA 39* 43* 42* 38*  --     LIVER FUNCTION TESTS:  Recent Labs  09/17/14 1217 11/26/14 0942 04/26/15 1539 04/29/15 1930  BILITOT 0.7 0.7 0.4 0.3  AST 28 25 20 24   ALT 21 18 12 15   ALKPHOS 62 49 65 37*  PROT 7.5 6.5 6.7 4.6*  ALBUMIN 4.4 3.9 3.6 2.1*    TUMOR MARKERS: No results for input(s): AFPTM, CEA, CA199, CHROMGRNA in the last 8760 hours.  Assessment and Plan:  Dire situation, pt hypotensive, massive UGI bleed, localized to duodenum, possible large ulcer. Arteriography to eval source with empiric GDA embo if neg to decrease pressure head to duod region. Gen surgery needs to be aware of situation as well. Coagulopathy, if develops, needs correction. Discussed situation with spouse and daughter.  Discussed angio/embo, possible risks, anticipated benefits, alternatives. They understand and consent to proceed.  Thank you for this interesting consult.  I greatly enjoyed meeting Sophia [name] and look forward to participating in their care.  A copy of this report was sent to the requesting provider on this date.  Signed: Satcha Storlie III, DAYNE Kourtney Montesinos 04/30/2015, 12:03 AM   I spent a total of 20 Minutes    in face to face in clinical consultation, greater than 50% of which was counseling/coordinating care for UGI bleed.

## 2015-04-30 NOTE — Consult Note (Signed)
CARDIOLOGY CONSULT NOTE       Patient ID: Sophia [name] MRN: [id number] DOB/AGE: [birth year] 81 y.o.  Admit date: 04/29/2015 Referring Physician:  Lamonte Sakai Primary Physician: Claretta Fraise, MD Primary Cardiologist:  New/  Reason for Consultation:  Elevated Troponin  Active Problems:   Upper GI bleed   Gastrointestinal hemorrhage associated with duodenal ulcer   Acute respiratory failure ([place])   Hemorrhagic shock   Metabolic acidosis   Acute respiratory failure with hypoxia (H[facility])   HPI:  81 y.o. admitted with massive upper GI bleed.  Hb as low as 4.8 earlier this am.  Resuscitated with multiple units of blood, fluid and somewhat prolonged hypovolemic shock.  EGD with DU And eventually had IR Radiology coil embolize GDA.  Currently intubated. Opens eyes to conversation. She has no history of cardiac issues. Rhythm has been stable in sinus tachycardia due  To hypovolemic shock requiring volume and pressors.  No indication of ches pain Troponin elevated to max 3.54.  ECG non acute essentially normal with non-specific ST changes.  CRF;s prior to admission HTN, elevated lipids and type 2 DM.  Also history of mild CRF and Cr has been in the 2.0 range since admission  No history of ETOH or smoking Currently in MICU intubated with stable hemodynamics and Hb around 8  Still on levophed and neosynephrine   ROS All other systems reviewed and negative except as noted above  Past Medical History  Diagnosis Date  . Hypertension   . Hyperlipidemia   . Diabetes mellitus without complication ([place])   . Chronic kidney disease     Family History  Problem Relation Age of Onset  . Heart disease Father   . Colon cancer Sister   . Heart disease Brother   . Heart disease Sister     Social History   Social History  . Marital Status: Married    Spouse Name: N/A  . Number of Children: N/A  . Years of Education: N/A   Occupational History  . Not on file.   Social History Main Topics  .  Smoking status: Never Smoker   . Smokeless tobacco: Never Used  . Alcohol Use: No  . Drug Use: No  . Sexual Activity: Not on file   Other Topics Concern  . Not on file   Social History Narrative    Past Surgical History  Procedure Laterality Date  . Cholecystectomy    . Sigmoidoscopy    . Esophagogastroduodenoscopy N/A 04/29/2015    Procedure: ESOPHAGOGASTRODUODENOSCOPY (EGD);  Surgeon: Clarene Essex, MD;  [facility] ENDOSCOPY;  Service: Endoscopy;  Laterality: N/A;     . sodium chloride   Intravenous Once  . sodium chloride   Intravenous Once  . sodium chloride  10 mL/hr Intravenous Once  . sodium chloride  10 mL/hr Intravenous Once  . sodium chloride   Intravenous Once  . antiseptic oral rinse  7 mL Mouth Rinse QID  . chlorhexidine gluconate  15 mL Mouth Rinse BID  . fentaNYL (SUBLIMAZE) injection  50 mcg Intravenous Once  . fentaNYL (SUBLIMAZE) injection  50 mcg Intravenous Once  . ketamine  2 mg/kg Intravenous Once  . levothyroxine  12.5 mcg Intravenous Daily  . midazolam  1 mg Intravenous Once  . [START ON 05/03/2015] pantoprazole (PROTONIX) IV  40 mg Intravenous Q12H   . fentaNYL infusion INTRAVENOUS 25 mcg/hr (04/30/15 0800)  . norepinephrine (LEVOPHED) Adult infusion Stopped (04/30/15 1200)  . pantoprozole (PROTONIX) infusion 8 mg/hr (04/30/15 1600)  . phenylephrine (NEO-SYNEPHRINE)  Adult infusion    .  sodium bicarbonate  infusion 1000 mL 100 mL/hr at 04/30/15 1525  . vasopressin (PITRESSIN) infusion - *FOR SHOCK* 0.03 Units/min (04/30/15 1600)    Physical Exam: Blood pressure 115/76, pulse 128, temperature 99.1 F (37.3 C), temperature source Oral, resp. rate 17, weight 69 kg (152 lb 1.9 oz), SpO2 100 %.   Intubated and sedated  Pale elderly white female  HEENT: normal Neck supple with no adenopathy JVP normal no bruits no thyromegaly Lungs clear with no wheezing and good diaphragmatic motion Heart:  S1/S2 no murmur, no rub, gallop or click PMI  normal Abdomen: benighn, BS positve, no tenderness, no AAA no bruit.  No HSM or HJR Distal pulses intact with no bruits No edema Neuro non-focal Skin warm and dry No muscular weakness IV catheter in RFV     Labs:   Lab Results  Component Value Date   WBC 18.1* 04/30/2015   HGB 8.1* 04/30/2015   HCT 23.4* 04/30/2015   MCV 81.0 04/30/2015   PLT 127* 04/30/2015    Recent Labs Lab 04/29/15 1930  04/30/15 1511  NA 144  < > 146*  K 4.0  < > 4.2  CL 116*  < > 122*  CO2 20*  < > 18*  BUN 44*  < > 31*  CREATININE 1.44*  < > 2.07*  CALCIUM 7.4*  < > 5.9*  PROT 4.6*  --   --   BILITOT 0.3  --   --   ALKPHOS 37*  --   --   ALT 15  --   --   AST 24  --   --   GLUCOSE 165*  < > 199*  < > = values in this interval not displayed. Lab Results  Component Value Date   TROPONINI 3.54* 04/30/2015    Lab Results  Component Value Date   CHOL 156 11/26/2014   CHOL 247* 09/17/2014   CHOL 158 09/19/2013   Lab Results  Component Value Date   HDL 57 11/26/2014   HDL 58 09/17/2014   HDL 66 09/19/2013   Lab Results  Component Value Date   LDLCALC 73 11/26/2014   LDLCALC 156* 09/17/2014   LDLCALC 72 09/19/2013   Lab Results  Component Value Date   TRIG 129 11/26/2014   TRIG 167* 09/17/2014   TRIG 99 09/19/2013   Lab Results  Component Value Date   CHOLHDL 2.7 11/26/2014   CHOLHDL 4.3 09/17/2014   No results found for: LDLDIRECT    Radiology: Ir Angiogram Visceral Selective  04/30/2015  CLINICAL DATA:  Large volume upper GI bleed. Endoscopy revealed probable duodenal ulcer has active source, incompletely controlled. EXAM: IR EMBO ART VEN HEMORR LYMPH EXTRAV INC GUIDE ROADMAPPING; IR ULTRASOUND GUIDANCE VASC ACCESS LEFT; ARTERIOGRAPHY; SELECTIVE VISCERAL ARTERIOGRAPHY; ADDITIONAL ARTERIOGRAPHY ANESTHESIA/SEDATION: Intravenous Fentanyl and Versed had been administered for previous procedure. No additional medications were administered. Radiology RN provided continuous  cardiorespiratory monitoring. MEDICATIONS: Lidocaine 1% subcutaneous 5 mL CONTRAST:  24mL OMNIPAQUE IOHEXOL 300 MG/ML  SOLN PROCEDURE: The procedure, risks (including but not limited to bleeding, infection, organ damage ), benefits, and alternatives were explained to the spouse and daughter. Questions regarding the procedure were encouraged and answered. The family understands and consents to the procedure. Because of a indwelling right femoral venous catheter, a left-sided femoral approach was selected. Site was marked, prepped with Betadine, draped in usual sterile fashion, infiltrated locally with 1% lidocaine. Under real-time ultrasound guidance, the left femoral artery  was accessed with a 21-gauge micropuncture needle, exchanged over a 018 guidewire for a transitional dilator, through which a 035 guidewire was advanced. A 5 French vascular sheath was placed. Through this a 5 French C2 catheter was coaxial advanced in used to selectively catheterize the celiac axis for selective arteriography. An angled Glidewire was advanced into the gastroduodenal artery but the C2 catheter could not be adequately advanced distally. A coaxial renegade micro catheter with transient guidewire was then advanced through the C2 and used to selectively catheterize the gastroduodenal artery. Coil embolization of the GDA performed with 3-5 mm coils the cessation of flow. After final arteriography through the C2 catheter, the catheter and sheath were removed and hemostasis achieved with the aid of the Exoseal device after confirmatory femoral arteriography. The patient tolerated the procedure well. COMPLICATIONS: None immediate FINDINGS: Brisk bleeding from a distal branch of the gastroduodenal artery was evident on the initial arteriogram, into what is probably the lumen of the duodenum. The vessel was selectively catheterized and coil embolization performed until cessation of flow through the treated segment and no further  extravasation evident. There is elsewhere diffuse arterial vasospasm. No other pathologic regions identified. IMPRESSION: 1. Brisk active arterial extravasation from a branch of the gastroduodenal artery probably into the duodenal lumen. 2. Technically successful coil embolization of the gastroduodenal artery with cessation of extravasation. Electronically Signed   By: Lucrezia Europe M.D.   On: 04/30/2015 12:08   Ir Angiogram Selective Each Additional Vessel  04/30/2015  CLINICAL DATA:  Large volume upper GI bleed. Endoscopy revealed probable duodenal ulcer has active source, incompletely controlled. EXAM: IR EMBO ART VEN HEMORR LYMPH EXTRAV INC GUIDE ROADMAPPING; IR ULTRASOUND GUIDANCE VASC ACCESS LEFT; ARTERIOGRAPHY; SELECTIVE VISCERAL ARTERIOGRAPHY; ADDITIONAL ARTERIOGRAPHY ANESTHESIA/SEDATION: Intravenous Fentanyl and Versed had been administered for previous procedure. No additional medications were administered. Radiology RN provided continuous cardiorespiratory monitoring. MEDICATIONS: Lidocaine 1% subcutaneous 5 mL CONTRAST:  41mL OMNIPAQUE IOHEXOL 300 MG/ML  SOLN PROCEDURE: The procedure, risks (including but not limited to bleeding, infection, organ damage ), benefits, and alternatives were explained to the spouse and daughter. Questions regarding the procedure were encouraged and answered. The family understands and consents to the procedure. Because of a indwelling right femoral venous catheter, a left-sided femoral approach was selected. Site was marked, prepped with Betadine, draped in usual sterile fashion, infiltrated locally with 1% lidocaine. Under real-time ultrasound guidance, the left femoral artery was accessed with a 21-gauge micropuncture needle, exchanged over a 018 guidewire for a transitional dilator, through which a 035 guidewire was advanced. A 5 French vascular sheath was placed. Through this a 5 French C2 catheter was coaxial advanced in used to selectively catheterize the celiac axis  for selective arteriography. An angled Glidewire was advanced into the gastroduodenal artery but the C2 catheter could not be adequately advanced distally. A coaxial renegade micro catheter with transient guidewire was then advanced through the C2 and used to selectively catheterize the gastroduodenal artery. Coil embolization of the GDA performed with 3-5 mm coils the cessation of flow. After final arteriography through the C2 catheter, the catheter and sheath were removed and hemostasis achieved with the aid of the Exoseal device after confirmatory femoral arteriography. The patient tolerated the procedure well. COMPLICATIONS: None immediate FINDINGS: Brisk bleeding from a distal branch of the gastroduodenal artery was evident on the initial arteriogram, into what is probably the lumen of the duodenum. The vessel was selectively catheterized and coil embolization performed until cessation of flow through the treated segment  and no further extravasation evident. There is elsewhere diffuse arterial vasospasm. No other pathologic regions identified. IMPRESSION: 1. Brisk active arterial extravasation from a branch of the gastroduodenal artery probably into the duodenal lumen. 2. Technically successful coil embolization of the gastroduodenal artery with cessation of extravasation. Electronically Signed   By: Lucrezia Europe M.D.   On: 04/30/2015 12:08   Ir Angiogram Follow Up Study  04/30/2015  CLINICAL DATA:  Large volume upper GI bleed. Endoscopy revealed probable duodenal ulcer has active source, incompletely controlled. EXAM: IR EMBO ART VEN HEMORR LYMPH EXTRAV INC GUIDE ROADMAPPING; IR ULTRASOUND GUIDANCE VASC ACCESS LEFT; ARTERIOGRAPHY; SELECTIVE VISCERAL ARTERIOGRAPHY; ADDITIONAL ARTERIOGRAPHY ANESTHESIA/SEDATION: Intravenous Fentanyl and Versed had been administered for previous procedure. No additional medications were administered. Radiology RN provided continuous cardiorespiratory monitoring. MEDICATIONS:  Lidocaine 1% subcutaneous 5 mL CONTRAST:  11mL OMNIPAQUE IOHEXOL 300 MG/ML  SOLN PROCEDURE: The procedure, risks (including but not limited to bleeding, infection, organ damage ), benefits, and alternatives were explained to the spouse and daughter. Questions regarding the procedure were encouraged and answered. The family understands and consents to the procedure. Because of a indwelling right femoral venous catheter, a left-sided femoral approach was selected. Site was marked, prepped with Betadine, draped in usual sterile fashion, infiltrated locally with 1% lidocaine. Under real-time ultrasound guidance, the left femoral artery was accessed with a 21-gauge micropuncture needle, exchanged over a 018 guidewire for a transitional dilator, through which a 035 guidewire was advanced. A 5 French vascular sheath was placed. Through this a 5 French C2 catheter was coaxial advanced in used to selectively catheterize the celiac axis for selective arteriography. An angled Glidewire was advanced into the gastroduodenal artery but the C2 catheter could not be adequately advanced distally. A coaxial renegade micro catheter with transient guidewire was then advanced through the C2 and used to selectively catheterize the gastroduodenal artery. Coil embolization of the GDA performed with 3-5 mm coils the cessation of flow. After final arteriography through the C2 catheter, the catheter and sheath were removed and hemostasis achieved with the aid of the Exoseal device after confirmatory femoral arteriography. The patient tolerated the procedure well. COMPLICATIONS: None immediate FINDINGS: Brisk bleeding from a distal branch of the gastroduodenal artery was evident on the initial arteriogram, into what is probably the lumen of the duodenum. The vessel was selectively catheterized and coil embolization performed until cessation of flow through the treated segment and no further extravasation evident. There is elsewhere diffuse  arterial vasospasm. No other pathologic regions identified. IMPRESSION: 1. Brisk active arterial extravasation from a branch of the gastroduodenal artery probably into the duodenal lumen. 2. Technically successful coil embolization of the gastroduodenal artery with cessation of extravasation. Electronically Signed   By: Lucrezia Europe M.D.   On: 04/30/2015 12:08   Ir US Guide Vasc Access Left  04/30/2015  CLINICAL DATA:  Large volume upper GI bleed. Endoscopy revealed probable duodenal ulcer has active source, incompletely controlled. EXAM: IR EMBO ART VEN HEMORR LYMPH EXTRAV INC GUIDE ROADMAPPING; IR ULTRASOUND GUIDANCE VASC ACCESS LEFT; ARTERIOGRAPHY; SELECTIVE VISCERAL ARTERIOGRAPHY; ADDITIONAL ARTERIOGRAPHY ANESTHESIA/SEDATION: Intravenous Fentanyl and Versed had been administered for previous procedure. No additional medications were administered. Radiology RN provided continuous cardiorespiratory monitoring. MEDICATIONS: Lidocaine 1% subcutaneous 5 mL CONTRAST:  85mL OMNIPAQUE IOHEXOL 300 MG/ML  SOLN PROCEDURE: The procedure, risks (including but not limited to bleeding, infection, organ damage ), benefits, and alternatives were explained to the spouse and daughter. Questions regarding the procedure were encouraged and answered. The family understands and  consents to the procedure. Because of a indwelling right femoral venous catheter, a left-sided femoral approach was selected. Site was marked, prepped with Betadine, draped in usual sterile fashion, infiltrated locally with 1% lidocaine. Under real-time ultrasound guidance, the left femoral artery was accessed with a 21-gauge micropuncture needle, exchanged over a 018 guidewire for a transitional dilator, through which a 035 guidewire was advanced. A 5 French vascular sheath was placed. Through this a 5 French C2 catheter was coaxial advanced in used to selectively catheterize the celiac axis for selective arteriography. An angled Glidewire was advanced into  the gastroduodenal artery but the C2 catheter could not be adequately advanced distally. A coaxial renegade micro catheter with transient guidewire was then advanced through the C2 and used to selectively catheterize the gastroduodenal artery. Coil embolization of the GDA performed with 3-5 mm coils the cessation of flow. After final arteriography through the C2 catheter, the catheter and sheath were removed and hemostasis achieved with the aid of the Exoseal device after confirmatory femoral arteriography. The patient tolerated the procedure well. COMPLICATIONS: None immediate FINDINGS: Brisk bleeding from a distal branch of the gastroduodenal artery was evident on the initial arteriogram, into what is probably the lumen of the duodenum. The vessel was selectively catheterized and coil embolization performed until cessation of flow through the treated segment and no further extravasation evident. There is elsewhere diffuse arterial vasospasm. No other pathologic regions identified. IMPRESSION: 1. Brisk active arterial extravasation from a branch of the gastroduodenal artery probably into the duodenal lumen. 2. Technically successful coil embolization of the gastroduodenal artery with cessation of extravasation. Electronically Signed   By: Lucrezia Europe M.D.   On: 04/30/2015 12:08   Dg Chest Port 1 View  04/29/2015  CLINICAL DATA:  Post intubation. EXAM: PORTABLE CHEST 1 VIEW COMPARISON:  04/29/2015 FINDINGS: Interval placement of an endotracheal tube with tip measuring 2.9 cm above the carina. Shallow inspiration. Normal heart size and pulmonary vascularity. Lungs appear clear. No blunting of costophrenic angles. No pneumothorax. Tortuous aorta. Surgical clips in the right upper quadrant. IMPRESSION: Endotracheal tube tip measures 2.9 cm above the carina. Shallow inspiration. No evidence of active pulmonary disease. Electronically Signed   By: Lucienne Capers M.D.   On: 04/29/2015 22:52   Dg Abd Portable  1v  04/29/2015  CLINICAL DATA:  81 year old female with bloody stool and vomiting blood EXAM: PORTABLE ABDOMEN - 1 VIEW COMPARISON:  None. FINDINGS: Single upright radiograph demonstrates gas within the stomach. No free air identified. External defibrillator pads project over the left chest and right upper quadrant. The heart is likely mildly enlarged. The lungs appear clear. No acute osseous abnormality. IMPRESSION: 1. No free air visualized. 2. Mild cardiomegaly. 3. The lungs appear clear. Electronically Signed   By: Jacqulynn Cadet M.D.   On: 04/29/2015 21:43   Dwight Guide Roadmapping  04/30/2015  CLINICAL DATA:  Large volume upper GI bleed. Endoscopy revealed probable duodenal ulcer has active source, incompletely controlled. EXAM: IR EMBO ART VEN HEMORR LYMPH EXTRAV INC GUIDE ROADMAPPING; IR ULTRASOUND GUIDANCE VASC ACCESS LEFT; ARTERIOGRAPHY; SELECTIVE VISCERAL ARTERIOGRAPHY; ADDITIONAL ARTERIOGRAPHY ANESTHESIA/SEDATION: Intravenous Fentanyl and Versed had been administered for previous procedure. No additional medications were administered. Radiology RN provided continuous cardiorespiratory monitoring. MEDICATIONS: Lidocaine 1% subcutaneous 5 mL CONTRAST:  42mL OMNIPAQUE IOHEXOL 300 MG/ML  SOLN PROCEDURE: The procedure, risks (including but not limited to bleeding, infection, organ damage ), benefits, and alternatives were explained to the spouse  and daughter. Questions regarding the procedure were encouraged and answered. The family understands and consents to the procedure. Because of a indwelling right femoral venous catheter, a left-sided femoral approach was selected. Site was marked, prepped with Betadine, draped in usual sterile fashion, infiltrated locally with 1% lidocaine. Under real-time ultrasound guidance, the left femoral artery was accessed with a 21-gauge micropuncture needle, exchanged over a 018 guidewire for a transitional dilator, through which a  035 guidewire was advanced. A 5 French vascular sheath was placed. Through this a 5 French C2 catheter was coaxial advanced in used to selectively catheterize the celiac axis for selective arteriography. An angled Glidewire was advanced into the gastroduodenal artery but the C2 catheter could not be adequately advanced distally. A coaxial renegade micro catheter with transient guidewire was then advanced through the C2 and used to selectively catheterize the gastroduodenal artery. Coil embolization of the GDA performed with 3-5 mm coils the cessation of flow. After final arteriography through the C2 catheter, the catheter and sheath were removed and hemostasis achieved with the aid of the Exoseal device after confirmatory femoral arteriography. The patient tolerated the procedure well. COMPLICATIONS: None immediate FINDINGS: Brisk bleeding from a distal branch of the gastroduodenal artery was evident on the initial arteriogram, into what is probably the lumen of the duodenum. The vessel was selectively catheterized and coil embolization performed until cessation of flow through the treated segment and no further extravasation evident. There is elsewhere diffuse arterial vasospasm. No other pathologic regions identified. IMPRESSION: 1. Brisk active arterial extravasation from a branch of the gastroduodenal artery probably into the duodenal lumen. 2. Technically successful coil embolization of the gastroduodenal artery with cessation of extravasation. Electronically Signed   By: Lucrezia Europe M.D.   On: 04/30/2015 12:08    EKG:  NSR nonspecific ST changes no acute ST depression or elevation    ASSESSMENT AND PLAN:   Elevated Troponin: Likely type 2 demand ischemia in setting of prolonged shock.  No indication for aggressive evaluation at this point since not clear that GI bleed resolved and still intubated on pressors.  ECG with no acute changes and no history of cardiac issues.  Continue to cycle enzymes.  Echo  to assess EF.  Rx limited by no ability to give anticoagulation and tachycardia from hypovolemic shock not appropriate to start beta blockers  Anemia:  Post coiling of GDA  Consider keeping Hb closer to 10 with elevated enzymes  Surgery has seen but acute bleed seems to have been Rx successfully with coil embolization Continue iv protonix  Thyroid:  On iv replacement   CRF:  CR stable at 2 despite shock.  Continue volume replacement   Signed: Jenkins Rouge 04/30/2015, 4:41 PM

## 2015-04-30 NOTE — Progress Notes (Signed)
Dr. Lamonte Sakai request Radial ALine to be place. RT attempted 2x to place without success. Hematoma developed @ right radial site. Dr. Lamonte Sakai @ bedside stated he would place femoral line.

## 2015-04-30 NOTE — Progress Notes (Signed)
CRITICAL VALUE ALERT  Critical value received:  Troponin 2.1  Date of notification:  12/13  Time of notification:  1215  Critical value read back:Yes.    Nurse who received alert:  Loma Newton  MD notified (1st page):  Richardson Landry Minor NP  Time of first page:  1215  MD notified (2nd page):  Time of second page:  Responding MD:  Richardson Landry Minor NP  Time MD responded:  [id number] at bedside

## 2015-04-30 NOTE — Procedures (Signed)
Mesenteric angio: brisk bleeding from GDA, stopped with coil embolization No complication No blood loss. See complete dictation in [facility].

## 2015-04-30 NOTE — Progress Notes (Signed)
Utilization review completed.  

## 2015-04-30 NOTE — Progress Notes (Signed)
Patient ID: Sophia [name], female   DOB: 07-22-1931, 81 y.o.   MRN: [id number]  She is actually stable right now.  It is old looking blood per rectum and per her NG. If her hemodynamics changes, will need exploration.  Will follow closely.

## 2015-04-30 NOTE — Consult Note (Signed)
Reason for Consult:Bleeding duodenal ulcer Referring Physician: Molina Hollenback is an 81 y.o. female.  HPI:  Pt is an 81 yo F brought to [facility] by EMS for near syncope and melena.  She is on ASA and NSAIDS at home.  She had some emesis, but this evolved into hematemesis and hemorrhagic shock.  She was brought to ICU, intubated, and EGD performed.  EGD demonstrated blood in stomach, then later fresh blood in duodenum.  A possible duodenal ulcer was seen.  She was then taken to IR and coil embolization was performed of the GDA which was successful.  Pt placed on pressors, Hgb was down as low as 4.8.  She is actively being resuscitated.    Past Medical History  Diagnosis Date  . Hypertension   . Hyperlipidemia   . Diabetes mellitus without complication ([place])   . Chronic kidney disease     Past Surgical History  Procedure Laterality Date  . Cholecystectomy    . Sigmoidoscopy      Family History  Problem Relation Age of Onset  . Heart disease Father   . Colon cancer Sister   . Heart disease Brother   . Heart disease Sister     Social History:  reports that she has never smoked. She has never used smokeless tobacco. She reports that she does not drink alcohol or use illicit drugs.  Allergies:  Allergies  Allergen Reactions  . Levothyroxine Other (See Comments)    GI pains    Medications:  Prior to Admission:  Prescriptions prior to admission  Medication Sig Dispense Refill Last Dose  . aspirin 81 MG tablet Take 81 mg by mouth daily.   04/28/2015 at Unknown time  . Calcium-Magnesium 500-250 MG TABS Take 1 tablet by mouth daily as needed (takes once in while).    Past Week at Unknown time  . Coenzyme Q10 (CO Q 10) 100 MG CAPS Take 100 mg by mouth daily as needed (takes once in while).    Past Week at Unknown time  . hyoscyamine (LEVSIN/SL) 0.125 MG SL tablet Place 1 tablet (0.125 mg total) under the tongue every 4 (four) hours as needed. For colon spasm 30 tablet 2  04/29/2015 at Unknown time  . Multiple Vitamins-Minerals (MULTIVITAMIN WITH MINERALS) tablet Take 1 tablet by mouth daily.   04/29/2015 at Unknown time  . neomycin-polymyxin b-dexamethasone (MAXITROL) 3.5-10000-0.1 OINT Place 1 application into the right eye at bedtime.    04/28/2015 at Unknown time  . Omega 3 1200 MG CAPS Take 1,200 mg by mouth daily as needed (takes once in while).    Past Week at Unknown time  . omeprazole (PRILOSEC) 20 MG capsule Take 1 capsule (20 mg total) by mouth daily. Take on an empty stomach 30 capsule 2 04/29/2015 at Unknown time  . simvastatin (ZOCOR) 40 MG tablet Take 0.5 tablets (20 mg total) by mouth daily. 90 tablet 1 04/28/2015 at Unknown time  . valsartan-hydrochlorothiazide (DIOVAN-HCT) 320-25 MG tablet TAKE ONE TABLET BY MOUTH ONCE DAILY 90 tablet 0 04/29/2015 at Unknown time  . levothyroxine (SYNTHROID, LEVOTHROID) 25 MCG tablet TAKE ONE TABLET BY MOUTH ONCE DAILY BEFORE BREAKFAST 30 tablet 10 Taking    Results for orders placed or performed during the hospital encounter of 04/29/15 (from the past 48 hour(s))  Comprehensive metabolic panel     Status: Abnormal   Collection Time: 04/29/15  7:30 PM  Result Value Ref Range   Sodium 144 135 - 145 mmol/L  Potassium 4.0 3.5 - 5.1 mmol/L   Chloride 116 (H) 101 - 111 mmol/L   CO2 20 (L) 22 - 32 mmol/L   Glucose, Bld 165 (H) 65 - 99 mg/dL   BUN 44 (H) 6 - 20 mg/dL   Creatinine, Ser 1.44 (H) 0.44 - 1.00 mg/dL   Calcium 7.4 (L) 8.9 - 10.3 mg/dL   Total Protein 4.6 (L) 6.5 - 8.1 g/dL   Albumin 2.1 (L) 3.5 - 5.0 g/dL   AST 24 15 - 41 U/L   ALT 15 14 - 54 U/L   Alkaline Phosphatase 37 (L) 38 - 126 U/L   Total Bilirubin 0.3 0.3 - 1.2 mg/dL   GFR calc non Af Amer 33 (L) >60 mL/min   GFR calc Af Amer 38 (L) >60 mL/min    Comment: (NOTE) The eGFR has been calculated using the CKD EPI equation. This calculation has not been validated in all clinical situations. eGFR's persistently <60 mL/min signify possible  Chronic Kidney Disease.    Anion gap 8 5 - 15  CBC     Status: Abnormal   Collection Time: 04/29/15  7:30 PM  Result Value Ref Range   WBC 24.6 (H) 4.0 - 10.5 K/uL   RBC 2.04 (L) 3.87 - 5.11 MIL/uL   Hemoglobin 6.5 (LL) 12.0 - 15.0 g/dL    Comment: REPEATED TO VERIFY CRITICAL RESULT CALLED TO, READ BACK BY AND VERIFIED WITH: T. PHILIP RN [phone] 2113 GREEN R    HCT 19.6 (L) 36.0 - 46.0 %   MCV 96.1 78.0 - 100.0 fL   MCH 31.9 26.0 - 34.0 pg   MCHC 33.2 30.0 - 36.0 g/dL   RDW 12.2 11.5 - 15.5 %   Platelets 326 150 - 400 K/uL  Type and screen [place]     Status: None (Preliminary result)   Collection Time: 04/29/15  7:30 PM  Result Value Ref Range   ABO/RH(D) A POS    Antibody Screen NEG    Sample Expiration 05/02/2015    Unit Number [id number]    Blood Component Type RED CELLS,LR    Unit division 00    Status of Unit ISSUED    Transfusion Status OK TO TRANSFUSE    Crossmatch Result Compatible    Unit Number [id number]    Blood Component Type RED CELLS,LR    Unit division 00    Status of Unit ISSUED    Transfusion Status OK TO TRANSFUSE    Crossmatch Result Compatible    Unit Number [id number]    Blood Component Type RED CELLS,LR    Unit division 00    Status of Unit ALLOCATED    Transfusion Status OK TO TRANSFUSE    Crossmatch Result Compatible    Unit Number [id number]    Blood Component Type RED CELLS,LR    Unit division 00    Status of Unit ALLOCATED    Transfusion Status OK TO TRANSFUSE    Crossmatch Result Compatible    Unit Number [id number]    Blood Component Type RED CELLS,LR    Unit division 00    Status of Unit ALLOCATED    Transfusion Status OK TO TRANSFUSE    Crossmatch Result Compatible    Unit Number [id number]    Blood Component Type RED CELLS,LR    Unit division 00    Status of Unit ISSUED    Transfusion Status OK TO TRANSFUSE    Crossmatch Result Compatible    Unit Number  [id number]    Blood  Component Type RED CELLS,LR    Unit division 00    Status of Unit ISSUED    Transfusion Status OK TO TRANSFUSE    Crossmatch Result Compatible    Unit Number [id number]    Blood Component Type RED CELLS,LR    Unit division 00    Status of Unit ISSUED    Transfusion Status OK TO TRANSFUSE    Crossmatch Result Compatible    Unit Number [id number]    Blood Component Type RED CELLS,LR    Unit division 00    Status of Unit ISSUED    Transfusion Status OK TO TRANSFUSE    Crossmatch Result Compatible    Unit Number [id number]    Blood Component Type RED CELLS,LR    Unit division 00    Status of Unit ISSUED    Transfusion Status OK TO TRANSFUSE    Crossmatch Result Compatible    Unit Number [id number]    Blood Component Type RED CELLS,LR    Unit division 00    Status of Unit ISSUED    Transfusion Status OK TO TRANSFUSE    Crossmatch Result Compatible    Unit Number [id number]    Blood Component Type RED CELLS,LR    Unit division 00    Status of Unit ALLOCATED    Transfusion Status OK TO TRANSFUSE    Crossmatch Result Compatible   Protime-INR     Status: Abnormal   Collection Time: 04/29/15  7:30 PM  Result Value Ref Range   Prothrombin Time 18.0 (H) 11.6 - 15.2 seconds   INR 1.48 0.00 - 1.49  APTT     Status: None   Collection Time: 04/29/15  7:30 PM  Result Value Ref Range   aPTT 30 24 - 37 seconds  ABO/Rh     Status: None   Collection Time: 04/29/15  7:30 PM  Result Value Ref Range   ABO/RH(D) A POS   POC occult blood, ED     Status: Abnormal   Collection Time: 04/29/15  7:45 PM  Result Value Ref Range   Fecal Occult Bld POSITIVE (A) NEGATIVE  I-Stat Chem 8, ED  (not at [facility])     Status: Abnormal   Collection Time: 04/29/15  8:12 PM  Result Value Ref Range   Sodium 145 135 - 145 mmol/L   Potassium 3.9 3.5 - 5.1 mmol/L   Chloride 114 (H) 101 - 111 mmol/L   BUN 44 (H) 6 - 20 mg/dL   Creatinine, Ser 1.30 (H) 0.44 - 1.00 mg/dL   Glucose, Bld 151  (H) 65 - 99 mg/dL   Calcium, Ion 1.08 (L) 1.13 - 1.30 mmol/L   TCO2 18 0 - 100 mmol/L   Hemoglobin 6.1 (LL) 12.0 - 15.0 g/dL   HCT 18.0 (L) 36.0 - 46.0 %   Comment NOTIFIED PHYSICIAN   Prepare RBC     Status: None   Collection Time: 04/29/15  8:20 PM  Result Value Ref Range   Order Confirmation ORDER PROCESSED BY BLOOD BANK   Prepare RBC     Status: None   Collection Time: 04/29/15  9:39 PM  Result Value Ref Range   Order Confirmation ORDER PROCESSED BY BLOOD BANK   Prepare RBC     Status: None   Collection Time: 04/29/15 10:50 PM  Result Value Ref Range   Order Confirmation ORDER PROCESSED BY BLOOD BANK   Prepare fresh frozen plasma     Status:  None (Preliminary result)   Collection Time: 04/29/15 10:51 PM  Result Value Ref Range   Unit Number [id number]    Blood Component Type THAWED PLASMA    Unit division 00    Status of Unit ISSUED    Transfusion Status OK TO TRANSFUSE    Unit Number [id number]    Blood Component Type THAWED PLASMA    Unit division 00    Status of Unit ISSUED    Transfusion Status OK TO TRANSFUSE   Prepare RBC     Status: None   Collection Time: 04/29/15 11:20 PM  Result Value Ref Range   Order Confirmation ORDER PROCESSED BY BLOOD BANK   Prepare fresh frozen plasma     Status: None (Preliminary result)   Collection Time: 04/29/15 11:20 PM  Result Value Ref Range   Unit Number [id number]    Blood Component Type THAWED PLASMA    Unit division 00    Status of Unit ISSUED    Transfusion Status OK TO TRANSFUSE    Unit Number [id number]    Blood Component Type THAWED PLASMA    Unit division 00    Status of Unit REL FROM [facility]    Transfusion Status OK TO TRANSFUSE   Prepare pheresed platelets     Status: None (Preliminary result)   Collection Time: 04/29/15 11:21 PM  Result Value Ref Range   Unit Number [id number]    Blood Component Type PLTPHER LR2    Unit division 00    Status of Unit ISSUED    Transfusion Status OK TO TRANSFUSE    MRSA PCR Screening     Status: None   Collection Time: 04/30/15  2:24 AM  Result Value Ref Range   MRSA by PCR NEGATIVE NEGATIVE    Comment:        The GeneXpert MRSA Assay (FDA approved for NASAL specimens only), is one component of a comprehensive MRSA colonization surveillance program. It is not intended to diagnose MRSA infection nor to guide or monitor treatment for MRSA infections.   Lactic acid, plasma     Status: Abnormal   Collection Time: 04/30/15  2:49 AM  Result Value Ref Range   Lactic Acid, Venous 10.3 (HH) 0.5 - 2.0 mmol/L    Comment: CRITICAL RESULT CALLED TO, READ BACK BY AND VERIFIED WITH: FLYNT F,RN 04/30/15 0328 WAYK   CBC     Status: Abnormal   Collection Time: 04/30/15  2:50 AM  Result Value Ref Range   WBC 17.2 (H) 4.0 - 10.5 K/uL   RBC 1.53 (L) 3.87 - 5.11 MIL/uL   Hemoglobin 4.8 (LL) 12.0 - 15.0 g/dL    Comment: REPEATED TO VERIFY CRITICAL VALUE NOTED.  VALUE IS CONSISTENT WITH PREVIOUSLY REPORTED AND CALLED VALUE.    HCT 14.4 (L) 36.0 - 46.0 %   MCV 94.1 78.0 - 100.0 fL   MCH 31.4 26.0 - 34.0 pg   MCHC 33.3 30.0 - 36.0 g/dL   RDW 15.0 11.5 - 15.5 %   Platelets 45 (L) 150 - 400 K/uL    Comment: REPEATED TO VERIFY SPECIMEN CHECKED FOR CLOTS PLATELET COUNT CONFIRMED BY SMEAR QUESTIONABLE RESULTS, RECOMMEND RECOLLECT TO VERIFY   Basic metabolic panel     Status: Abnormal   Collection Time: 04/30/15  2:50 AM  Result Value Ref Range   Sodium 143 135 - 145 mmol/L   Potassium 4.2 3.5 - 5.1 mmol/L   Chloride 126 (H) 101 - 111 mmol/L   CO2 7 (  L) 22 - 32 mmol/L   Glucose, Bld 422 (H) 65 - 99 mg/dL   BUN 29 (H) 6 - 20 mg/dL   Creatinine, Ser 1.37 (H) 0.44 - 1.00 mg/dL   Calcium 4.9 (LL) 8.9 - 10.3 mg/dL    Comment: CRITICAL RESULT CALLED TO, READ BACK BY AND VERIFIED WITH: FLYNT F,RN 04/30/15 0325 WAYK    GFR calc non Af Amer 35 (L) >60 mL/min   GFR calc Af Amer 41 (L) >60 mL/min    Comment: (NOTE) The eGFR has been calculated using the CKD  EPI equation. This calculation has not been validated in all clinical situations. eGFR's persistently <60 mL/min signify possible Chronic Kidney Disease.    Anion gap 10 5 - 15  Magnesium     Status: Abnormal   Collection Time: 04/30/15  2:50 AM  Result Value Ref Range   Magnesium 1.3 (L) 1.7 - 2.4 mg/dL  Phosphorus     Status: None   Collection Time: 04/30/15  2:50 AM  Result Value Ref Range   Phosphorus 3.1 2.5 - 4.6 mg/dL  Protime-INR     Status: Abnormal   Collection Time: 04/30/15  2:50 AM  Result Value Ref Range   Prothrombin Time 33.9 (H) 11.6 - 15.2 seconds   INR 3.43 (H) 0.00 - 1.49  Prepare RBC     Status: None   Collection Time: 04/30/15  3:21 AM  Result Value Ref Range   Order Confirmation ORDER PROCESSED BY BLOOD BANK     Dg Chest Port 1 View  04/29/2015  CLINICAL DATA:  Post intubation. EXAM: PORTABLE CHEST 1 VIEW COMPARISON:  04/29/2015 FINDINGS: Interval placement of an endotracheal tube with tip measuring 2.9 cm above the carina. Shallow inspiration. Normal heart size and pulmonary vascularity. Lungs appear clear. No blunting of costophrenic angles. No pneumothorax. Tortuous aorta. Surgical clips in the right upper quadrant. IMPRESSION: Endotracheal tube tip measures 2.9 cm above the carina. Shallow inspiration. No evidence of active pulmonary disease. Electronically Signed   By: Lucienne Capers M.D.   On: 04/29/2015 22:52   Dg Abd Portable 1v  04/29/2015  CLINICAL DATA:  81 year old female with bloody stool and vomiting blood EXAM: PORTABLE ABDOMEN - 1 VIEW COMPARISON:  None. FINDINGS: Single upright radiograph demonstrates gas within the stomach. No free air identified. External defibrillator pads project over the left chest and right upper quadrant. The heart is likely mildly enlarged. The lungs appear clear. No acute osseous abnormality. IMPRESSION: 1. No free air visualized. 2. Mild cardiomegaly. 3. The lungs appear clear. Electronically Signed   By: Jacqulynn Cadet M.D.   On: 04/29/2015 21:43    Review of Systems  Unable to perform ROS: intubated  Gastrointestinal: Positive for nausea, vomiting and blood in stool.   Blood pressure 182/97, pulse 88, temperature 93 F (33.9 C), temperature source Axillary, resp. rate 26, SpO2 100 %. Physical Exam  Constitutional: She appears well-developed and well-nourished. No distress.  HENT:  Head: Normocephalic.  Forehead lac  Eyes: Conjunctivae are normal. Pupils are equal, round, and reactive to light. Right eye exhibits no discharge. Left eye exhibits no discharge. No scleral icterus.  Neck: Neck supple. No tracheal deviation present. No thyromegaly present.  Cardiovascular: Normal rate and regular rhythm.   Respiratory: Effort normal. No respiratory distress.  On vent  Neurological:  Grimaces with abdominal exam.  Previously was not responding, not on sedation.    Skin: She is not diaphoretic.  Psychiatric:  Not alert, not on  sedation   Assessment/Plan: Bleeding duodenal ulcer Hemorrhagic shock Coagulopathy Acute kidney injury on chronic kidney disease DM Metabolic acidosis Hypomagnesemia   Continue resuscitation Bleeding appears to have stopped due to successful GDA ligation  Will be available for further issues If patient continues to have poor mental status, consider palliative care discussion about goals of care.    Khalif Stender 04/30/2015, 5:30 AM

## 2015-05-01 ENCOUNTER — Inpatient Hospital Stay (HOSPITAL_COMMUNITY): Payer: Medicare Other

## 2015-05-01 DIAGNOSIS — J9600 Acute respiratory failure, unspecified whether with hypoxia or hypercapnia: Secondary | ICD-10-CM

## 2015-05-01 LAB — PREPARE FRESH FROZEN PLASMA
Unit division: 0
Unit division: 0

## 2015-05-01 LAB — BLOOD GAS, ARTERIAL
Acid-base deficit: 4.3 mmol/L — ABNORMAL HIGH (ref 0.0–2.0)
Bicarbonate: 18.8 mEq/L — ABNORMAL LOW (ref 20.0–24.0)
Drawn by: 44956
FIO2: 0.4
MECHVT: 440 mL
O2 Saturation: 97.8 %
PEEP: 5 cmH2O
Patient temperature: 98.6
RATE: 24 resp/min
TCO2: 19.7 mmol/L (ref 0–100)
pCO2 arterial: 26.8 mmHg — ABNORMAL LOW (ref 35.0–45.0)
pH, Arterial: 7.46 — ABNORMAL HIGH (ref 7.350–7.450)
pO2, Arterial: 88.4 mmHg (ref 80.0–100.0)

## 2015-05-01 LAB — BASIC METABOLIC PANEL
Anion gap: 6 (ref 5–15)
BUN: 41 mg/dL — ABNORMAL HIGH (ref 6–20)
CO2: 21 mmol/L — ABNORMAL LOW (ref 22–32)
Calcium: 5.7 mg/dL — CL (ref 8.9–10.3)
Chloride: 117 mmol/L — ABNORMAL HIGH (ref 101–111)
Creatinine, Ser: 2.49 mg/dL — ABNORMAL HIGH (ref 0.44–1.00)
GFR calc Af Amer: 20 mL/min — ABNORMAL LOW (ref >60)
GFR calc non Af Amer: 17 mL/min — ABNORMAL LOW (ref >60)
Glucose, Bld: 189 mg/dL — ABNORMAL HIGH (ref 65–99)
Potassium: 3.6 mmol/L (ref 3.5–5.1)
Sodium: 144 mmol/L (ref 135–145)

## 2015-05-01 LAB — HEMOGLOBIN AND HEMATOCRIT, BLOOD
HCT: 18.1 % — ABNORMAL LOW (ref 36.0–46.0)
HCT: 22.8 % — ABNORMAL LOW (ref 36.0–46.0)
Hemoglobin: 6.3 g/dL — CL (ref 12.0–15.0)
Hemoglobin: 8.1 g/dL — ABNORMAL LOW (ref 12.0–15.0)

## 2015-05-01 LAB — CBC WITH DIFFERENTIAL/PLATELET
Basophils Absolute: 0 10*3/uL (ref 0.0–0.1)
Basophils Relative: 0 %
Eosinophils Absolute: 0 10*3/uL (ref 0.0–0.7)
Eosinophils Relative: 0 %
HCT: 23 % — ABNORMAL LOW (ref 36.0–46.0)
Hemoglobin: 8.1 g/dL — ABNORMAL LOW (ref 12.0–15.0)
Lymphocytes Relative: 7 %
Lymphs Abs: 1.3 10*3/uL (ref 0.7–4.0)
MCH: 29 pg (ref 26.0–34.0)
MCHC: 35.2 g/dL (ref 30.0–36.0)
MCV: 82.4 fL (ref 78.0–100.0)
Monocytes Absolute: 1.1 10*3/uL — ABNORMAL HIGH (ref 0.1–1.0)
Monocytes Relative: 6 %
Neutro Abs: 14.8 10*3/uL — ABNORMAL HIGH (ref 1.7–7.7)
Neutrophils Relative %: 86 %
Platelets: 99 10*3/uL — ABNORMAL LOW (ref 150–400)
RBC: 2.79 MIL/uL — ABNORMAL LOW (ref 3.87–5.11)
RDW: 18.4 % — ABNORMAL HIGH (ref 11.5–15.5)
WBC: 17.2 10*3/uL — ABNORMAL HIGH (ref 4.0–10.5)

## 2015-05-01 LAB — CBC
HCT: 22.1 % — ABNORMAL LOW (ref 36.0–46.0)
HCT: 22.9 % — ABNORMAL LOW (ref 36.0–46.0)
HCT: 21.7 % — ABNORMAL LOW (ref 36.0–46.0)
HCT: 22.1 % — ABNORMAL LOW (ref 36.0–46.0)
Hemoglobin: 7.7 g/dL — ABNORMAL LOW (ref 12.0–15.0)
Hemoglobin: 8.2 g/dL — ABNORMAL LOW (ref 12.0–15.0)
Hemoglobin: 7.5 g/dL — ABNORMAL LOW (ref 12.0–15.0)
Hemoglobin: 7.7 g/dL — ABNORMAL LOW (ref 12.0–15.0)
MCH: 28.7 pg (ref 26.0–34.0)
MCH: 29.4 pg (ref 26.0–34.0)
MCH: 28.7 pg (ref 26.0–34.0)
MCH: 28.9 pg (ref 26.0–34.0)
MCHC: 34.8 g/dL (ref 30.0–36.0)
MCHC: 35.8 g/dL (ref 30.0–36.0)
MCHC: 34.6 g/dL (ref 30.0–36.0)
MCHC: 34.8 g/dL (ref 30.0–36.0)
MCV: 82.5 fL (ref 78.0–100.0)
MCV: 82.1 fL (ref 78.0–100.0)
MCV: 83.1 fL (ref 78.0–100.0)
MCV: 83.1 fL (ref 78.0–100.0)
Platelets: 92 10*3/uL — ABNORMAL LOW (ref 150–400)
Platelets: 98 10*3/uL — ABNORMAL LOW (ref 150–400)
Platelets: 95 10*3/uL — ABNORMAL LOW (ref 150–400)
Platelets: 105 10*3/uL — ABNORMAL LOW (ref 150–400)
RBC: 2.68 MIL/uL — ABNORMAL LOW (ref 3.87–5.11)
RBC: 2.79 MIL/uL — ABNORMAL LOW (ref 3.87–5.11)
RBC: 2.61 MIL/uL — ABNORMAL LOW (ref 3.87–5.11)
RBC: 2.66 MIL/uL — ABNORMAL LOW (ref 3.87–5.11)
RDW: 18.1 % — ABNORMAL HIGH (ref 11.5–15.5)
RDW: 17.8 % — ABNORMAL HIGH (ref 11.5–15.5)
RDW: 17.9 % — ABNORMAL HIGH (ref 11.5–15.5)
RDW: 17.7 % — ABNORMAL HIGH (ref 11.5–15.5)
WBC: 17.5 10*3/uL — ABNORMAL HIGH (ref 4.0–10.5)
WBC: 20.2 10*3/uL — ABNORMAL HIGH (ref 4.0–10.5)
WBC: 18.5 10*3/uL — ABNORMAL HIGH (ref 4.0–10.5)
WBC: 19.3 10*3/uL — ABNORMAL HIGH (ref 4.0–10.5)

## 2015-05-01 LAB — PREPARE PLATELET PHERESIS: Unit division: 0

## 2015-05-01 LAB — LACTIC ACID, PLASMA: Lactic Acid, Venous: 2.6 mmol/L (ref 0.5–2.0)

## 2015-05-01 LAB — GLUCOSE, CAPILLARY
Glucose-Capillary: 134 mg/dL — ABNORMAL HIGH (ref 65–99)
Glucose-Capillary: 139 mg/dL — ABNORMAL HIGH (ref 65–99)

## 2015-05-01 LAB — HEMOGLOBIN A1C
Hgb A1c MFr Bld: 5.6 % (ref 4.8–5.6)
Mean Plasma Glucose: 114 mg/dL

## 2015-05-01 LAB — TROPONIN I: Troponin I: 2.29 ng/mL (ref <0.031)

## 2015-05-01 LAB — PHOSPHORUS: Phosphorus: 2.1 mg/dL — ABNORMAL LOW (ref 2.5–4.6)

## 2015-05-01 LAB — MAGNESIUM: Magnesium: 1.7 mg/dL (ref 1.7–2.4)

## 2015-05-01 LAB — PREPARE RBC (CROSSMATCH)

## 2015-05-01 MED ORDER — INSULIN ASPART 100 UNIT/ML ~~LOC~~ SOLN
0.0000 [IU] | SUBCUTANEOUS | Status: DC
  Administered 2015-05-01 – 2015-05-02 (×4): 2 [IU] via SUBCUTANEOUS

## 2015-05-01 MED ORDER — SODIUM CHLORIDE 0.9 % IV SOLN
Freq: Once | INTRAVENOUS | Status: AC
Start: 2015-05-01 — End: 2015-05-01
  Administered 2015-05-01: 04:00:00 via INTRAVENOUS

## 2015-05-01 NOTE — Progress Notes (Signed)
[place] Progress Note Patient Name: Llewellyn Aswad DOB: 1932-02-20 MRN: [id number]   Date of Service  05/01/2015  HPI/Events of Note  Hb=6.3  eICU Interventions  Transfuse 1u PRBC     Intervention Category Major Interventions: Hemorrhage - evaluation and management  Kirsten Spearing 05/01/2015, 3:21 AM

## 2015-05-01 NOTE — Progress Notes (Signed)
CRITICAL VALUE ALERT  Critical value received:  2.6  Date of notification:  05/01/15  Time of notification:  0130  Critical value read back:  yes  Nurse who received alert:  Carlus Pavlov  MD notified (1st page):  [email or web address]  Time of first page:  0147  MD notified (2nd page):  Time of second page:  Responding MD:  Carlynn Herald  Time MD responded:  [phone]

## 2015-05-01 NOTE — Progress Notes (Signed)
Called to inform elink md of pt lactic acid 2.6, stable systolic bps, levo currently off.

## 2015-05-01 NOTE — Procedures (Signed)
Extubation Procedure Note  Patient Details:   Name: Madelina Seiders DOB: 11-03-1931 MRN: [id number]   Airway Documentation:  Airway 7.5 mm (Active)  Secured at (cm) 25 cm 05/01/2015 11:08 AM  Measured From Lips 05/01/2015 11:08 AM  Secured Location Right 05/01/2015 11:08 AM  Secured By [employer] 05/01/2015 11:08 AM  Tube Holder Repositioned Yes 05/01/2015 11:08 AM  Cuff Pressure (cm H2O) 24 cm H2O 05/01/2015 12:07 AM  Site Condition Dry 05/01/2015 11:08 AM    Evaluation  O2 sats: stable throughout Complications: No apparent complications Patient did tolerate procedure well. Bilateral Breath Sounds: Clear Suctioning: Airway Yes   Pt. Was extubated to a 4L [place] without any complications, dyspnea or stridor noted. Pt. Was instructed on IS x 5, highest goal achieved was 1,125 mL.   Troy Hartzog, Eddie North 05/01/2015, 2:57, PM

## 2015-05-01 NOTE — Progress Notes (Signed)
Patient ID: Sophia [name], female   DOB: [birth year], 81 y.o.   MRN: [id number] [facility] Gastroenterology Progress Note  Sophia [name] 81 y.o. 05/23/1931   Subjective: Continues to pass maroon and black stools per nursing. Hgb 6.3 this morning and 1 U PRBCs subsequently given and now Hgb 8.1.  Objective: Vital signs in last 24 hours: Filed Vitals:   05/01/15 0722 05/01/15 0807  BP: 96/64   Pulse: 81   Temp:  99.3 F (37.4 C)  Resp: 24     Physical Exam: Gen: sedated, intubated CV: RRR Chest: CTA B Abd: nontender, less distended, +BS Skin: no rash  Lab Results:  Recent Labs  04/30/15 0250 04/30/15 1511 05/01/15 0630  NA 143 146* 144  K 4.2 4.2 3.6  CL 126* 122* 117*  CO2 7* 18* 21*  GLUCOSE 422* 199* 189*  BUN 29* 31* 41*  CREATININE 1.37* 2.07* 2.49*  CALCIUM 4.9* 5.9* 5.7*  MG 1.3*  --  1.7  PHOS 3.1  --  2.1*    Recent Labs  04/29/15 1930  AST 24  ALT 15  ALKPHOS 37*  BILITOT 0.3  PROT 4.6*  ALBUMIN 2.1*    Recent Labs  04/30/15 1945 05/01/15 0253 05/01/15 0630  WBC 16.9*  --  17.2*  NEUTROABS  --   --  14.8*  HGB 7.4* 6.3* 8.1*  HCT 21.5* 18.1* 23.0*  MCV 80.2  --  82.4  PLT 118*  --  99*    Recent Labs  04/30/15 0250 04/30/15 1530  LABPROT 33.9* 22.3*  INR 3.43* 1.97*      Assessment/Plan: S/P upper GI bleed likely from duodenal ulcer seen on EGD. S/P embolization to GDA by IR but continues to bleed. Surgery is needed if bleeding persisting. Continue Protonix drip. Will sign off. Call us back if needed.   Hughson C. 05/01/2015, 8:51 AM  Pager [phone]  If no answer or after 5 PM call [phone]

## 2015-05-01 NOTE — Procedures (Signed)
Central Venous Catheter Insertion Procedure Note Sophia [name] [id number] [birth year]  Procedure: Insertion of Central Venous Catheter Indications: Drug and/or fluid administration  Procedure Details Consent: Risks of procedure as well as the alternatives and risks of each were explained to the (patient/caregiver).  Consent for procedure obtained. Time Out: Verified patient identification, verified procedure, site/side was marked, verified correct patient position, special equipment/implants available, medications/allergies/relevent history reviewed, required imaging and test results available.  Performed  Maximum sterile technique was used including antiseptics, cap, gloves, gown, hand hygiene, mask and sheet. Skin prep: Chlorhexidine; local anesthetic administered A antimicrobial bonded/coated triple lumen catheter was placed in the left internal jugular vein using the Seldinger technique. Ultrasound guidance used.Yes.   Catheter placed to 20 cm. Blood aspirated via all 3 ports and then flushed x 3. Line sutured x 2 and dressing applied.  Evaluation Blood flow good Complications: No apparent complications Patient did tolerate procedure well. Chest X-ray ordered to verify placement.  CXR: pending.  Georgann Housekeeper, AGACNP-BC [place] [facility]/Critical [facility] Pager [phone] or [phone]  05/01/2015 1:58 PM  Rush Farmer, M.D. [facility] Pulmonary/Critical Care Medicine. Pager: [phone]. After hours pager: [phone].

## 2015-05-01 NOTE — Progress Notes (Signed)
2 Days Post-Op  Subjective: Hgb drifted back down.  Responded to 1 unit of blood.  Objective: Vital signs in last 24 hours: Temp:  [98.4 F (36.9 C)-99.7 F (37.6 C)] 99.3 F (37.4 C) (12/14 0807) Pulse Rate:  [71-133] 77 (12/14 0700) Resp:  [0-29] 24 (12/14 0700) BP: (90-139)/(51-93) 92/70 mmHg (12/14 0700) SpO2:  [98 %-100 %] 99 % (12/14 0700) FiO2 (%):  [40 %] 40 % (12/14 0539) Weight:  [78.8 kg (173 lb 11.6 oz)] 78.8 kg (173 lb 11.6 oz) (12/14 0500) Last BM Date: 04/30/15  Intake/Output from previous day: 12/13 0701 - 12/14 0700 In: 5378.8 [I.V.:3893.8; Blood:335; NG/GT:150; IV Piggyback:1000] Out: 1615 [Urine:915; Emesis/NG output:700] Intake/Output this shift:    On vent, sedated Abdomen soft  Lab Results:   Recent Labs  04/30/15 1945 05/01/15 0253 05/01/15 0630  WBC 16.9*  --  17.2*  HGB 7.4* 6.3* 8.1*  HCT 21.5* 18.1* 23.0*  PLT 118*  --  99*   BMET  Recent Labs  04/30/15 1511 05/01/15 0630  NA 146* 144  K 4.2 3.6  CL 122* 117*  CO2 18* 21*  GLUCOSE 199* 189*  BUN 31* 41*  CREATININE 2.07* 2.49*  CALCIUM 5.9* 5.7*   PT/INR  Recent Labs  04/30/15 0250 04/30/15 1530  LABPROT 33.9* 22.3*  INR 3.43* 1.97*   ABG  Recent Labs  04/30/15 1451 05/01/15 0420  PHART 7.290* 7.460*  HCO3 15.3* 18.8*    Studies/Results: Ir Angiogram Visceral Selective  04/30/2015  CLINICAL DATA:  Large volume upper GI bleed. Endoscopy revealed probable duodenal ulcer has active source, incompletely controlled. EXAM: IR EMBO ART VEN HEMORR LYMPH EXTRAV INC GUIDE ROADMAPPING; IR ULTRASOUND GUIDANCE VASC ACCESS LEFT; ARTERIOGRAPHY; SELECTIVE VISCERAL ARTERIOGRAPHY; ADDITIONAL ARTERIOGRAPHY ANESTHESIA/SEDATION: Intravenous Fentanyl and Versed had been administered for previous procedure. No additional medications were administered. Radiology RN provided continuous cardiorespiratory monitoring. MEDICATIONS: Lidocaine 1% subcutaneous 5 mL CONTRAST:  60mL OMNIPAQUE  IOHEXOL 300 MG/ML  SOLN PROCEDURE: The procedure, risks (including but not limited to bleeding, infection, organ damage ), benefits, and alternatives were explained to the spouse and daughter. Questions regarding the procedure were encouraged and answered. The family understands and consents to the procedure. Because of a indwelling right femoral venous catheter, a left-sided femoral approach was selected. Site was marked, prepped with Betadine, draped in usual sterile fashion, infiltrated locally with 1% lidocaine. Under real-time ultrasound guidance, the left femoral artery was accessed with a 21-gauge micropuncture needle, exchanged over a 018 guidewire for a transitional dilator, through which a 035 guidewire was advanced. A 5 French vascular sheath was placed. Through this a 5 French C2 catheter was coaxial advanced in used to selectively catheterize the celiac axis for selective arteriography. An angled Glidewire was advanced into the gastroduodenal artery but the C2 catheter could not be adequately advanced distally. A coaxial renegade micro catheter with transient guidewire was then advanced through the C2 and used to selectively catheterize the gastroduodenal artery. Coil embolization of the GDA performed with 3-5 mm coils the cessation of flow. After final arteriography through the C2 catheter, the catheter and sheath were removed and hemostasis achieved with the aid of the Exoseal device after confirmatory femoral arteriography. The patient tolerated the procedure well. COMPLICATIONS: None immediate FINDINGS: Brisk bleeding from a distal branch of the gastroduodenal artery was evident on the initial arteriogram, into what is probably the lumen of the duodenum. The vessel was selectively catheterized and coil embolization performed until cessation of flow through the treated  segment and no further extravasation evident. There is elsewhere diffuse arterial vasospasm. No other pathologic regions identified.  IMPRESSION: 1. Brisk active arterial extravasation from a branch of the gastroduodenal artery probably into the duodenal lumen. 2. Technically successful coil embolization of the gastroduodenal artery with cessation of extravasation. Electronically Signed   By: Lucrezia Europe M.D.   On: 04/30/2015 12:08   Ir Angiogram Selective Each Additional Vessel  04/30/2015  CLINICAL DATA:  Large volume upper GI bleed. Endoscopy revealed probable duodenal ulcer has active source, incompletely controlled. EXAM: IR EMBO ART VEN HEMORR LYMPH EXTRAV INC GUIDE ROADMAPPING; IR ULTRASOUND GUIDANCE VASC ACCESS LEFT; ARTERIOGRAPHY; SELECTIVE VISCERAL ARTERIOGRAPHY; ADDITIONAL ARTERIOGRAPHY ANESTHESIA/SEDATION: Intravenous Fentanyl and Versed had been administered for previous procedure. No additional medications were administered. Radiology RN provided continuous cardiorespiratory monitoring. MEDICATIONS: Lidocaine 1% subcutaneous 5 mL CONTRAST:  68mL OMNIPAQUE IOHEXOL 300 MG/ML  SOLN PROCEDURE: The procedure, risks (including but not limited to bleeding, infection, organ damage ), benefits, and alternatives were explained to the spouse and daughter. Questions regarding the procedure were encouraged and answered. The family understands and consents to the procedure. Because of a indwelling right femoral venous catheter, a left-sided femoral approach was selected. Site was marked, prepped with Betadine, draped in usual sterile fashion, infiltrated locally with 1% lidocaine. Under real-time ultrasound guidance, the left femoral artery was accessed with a 21-gauge micropuncture needle, exchanged over a 018 guidewire for a transitional dilator, through which a 035 guidewire was advanced. A 5 French vascular sheath was placed. Through this a 5 French C2 catheter was coaxial advanced in used to selectively catheterize the celiac axis for selective arteriography. An angled Glidewire was advanced into the gastroduodenal artery but the C2  catheter could not be adequately advanced distally. A coaxial renegade micro catheter with transient guidewire was then advanced through the C2 and used to selectively catheterize the gastroduodenal artery. Coil embolization of the GDA performed with 3-5 mm coils the cessation of flow. After final arteriography through the C2 catheter, the catheter and sheath were removed and hemostasis achieved with the aid of the Exoseal device after confirmatory femoral arteriography. The patient tolerated the procedure well. COMPLICATIONS: None immediate FINDINGS: Brisk bleeding from a distal branch of the gastroduodenal artery was evident on the initial arteriogram, into what is probably the lumen of the duodenum. The vessel was selectively catheterized and coil embolization performed until cessation of flow through the treated segment and no further extravasation evident. There is elsewhere diffuse arterial vasospasm. No other pathologic regions identified. IMPRESSION: 1. Brisk active arterial extravasation from a branch of the gastroduodenal artery probably into the duodenal lumen. 2. Technically successful coil embolization of the gastroduodenal artery with cessation of extravasation. Electronically Signed   By: Lucrezia Europe M.D.   On: 04/30/2015 12:08   Ir Angiogram Follow Up Study  04/30/2015  CLINICAL DATA:  Large volume upper GI bleed. Endoscopy revealed probable duodenal ulcer has active source, incompletely controlled. EXAM: IR EMBO ART VEN HEMORR LYMPH EXTRAV INC GUIDE ROADMAPPING; IR ULTRASOUND GUIDANCE VASC ACCESS LEFT; ARTERIOGRAPHY; SELECTIVE VISCERAL ARTERIOGRAPHY; ADDITIONAL ARTERIOGRAPHY ANESTHESIA/SEDATION: Intravenous Fentanyl and Versed had been administered for previous procedure. No additional medications were administered. Radiology RN provided continuous cardiorespiratory monitoring. MEDICATIONS: Lidocaine 1% subcutaneous 5 mL CONTRAST:  52mL OMNIPAQUE IOHEXOL 300 MG/ML  SOLN PROCEDURE: The procedure,  risks (including but not limited to bleeding, infection, organ damage ), benefits, and alternatives were explained to the spouse and daughter. Questions regarding the procedure were encouraged and answered. The family  understands and consents to the procedure. Because of a indwelling right femoral venous catheter, a left-sided femoral approach was selected. Site was marked, prepped with Betadine, draped in usual sterile fashion, infiltrated locally with 1% lidocaine. Under real-time ultrasound guidance, the left femoral artery was accessed with a 21-gauge micropuncture needle, exchanged over a 018 guidewire for a transitional dilator, through which a 035 guidewire was advanced. A 5 French vascular sheath was placed. Through this a 5 French C2 catheter was coaxial advanced in used to selectively catheterize the celiac axis for selective arteriography. An angled Glidewire was advanced into the gastroduodenal artery but the C2 catheter could not be adequately advanced distally. A coaxial renegade micro catheter with transient guidewire was then advanced through the C2 and used to selectively catheterize the gastroduodenal artery. Coil embolization of the GDA performed with 3-5 mm coils the cessation of flow. After final arteriography through the C2 catheter, the catheter and sheath were removed and hemostasis achieved with the aid of the Exoseal device after confirmatory femoral arteriography. The patient tolerated the procedure well. COMPLICATIONS: None immediate FINDINGS: Brisk bleeding from a distal branch of the gastroduodenal artery was evident on the initial arteriogram, into what is probably the lumen of the duodenum. The vessel was selectively catheterized and coil embolization performed until cessation of flow through the treated segment and no further extravasation evident. There is elsewhere diffuse arterial vasospasm. No other pathologic regions identified. IMPRESSION: 1. Brisk active arterial extravasation  from a branch of the gastroduodenal artery probably into the duodenal lumen. 2. Technically successful coil embolization of the gastroduodenal artery with cessation of extravasation. Electronically Signed   By: Lucrezia Europe M.D.   On: 04/30/2015 12:08   Ir US Guide Vasc Access Left  04/30/2015  CLINICAL DATA:  Large volume upper GI bleed. Endoscopy revealed probable duodenal ulcer has active source, incompletely controlled. EXAM: IR EMBO ART VEN HEMORR LYMPH EXTRAV INC GUIDE ROADMAPPING; IR ULTRASOUND GUIDANCE VASC ACCESS LEFT; ARTERIOGRAPHY; SELECTIVE VISCERAL ARTERIOGRAPHY; ADDITIONAL ARTERIOGRAPHY ANESTHESIA/SEDATION: Intravenous Fentanyl and Versed had been administered for previous procedure. No additional medications were administered. Radiology RN provided continuous cardiorespiratory monitoring. MEDICATIONS: Lidocaine 1% subcutaneous 5 mL CONTRAST:  48mL OMNIPAQUE IOHEXOL 300 MG/ML  SOLN PROCEDURE: The procedure, risks (including but not limited to bleeding, infection, organ damage ), benefits, and alternatives were explained to the spouse and daughter. Questions regarding the procedure were encouraged and answered. The family understands and consents to the procedure. Because of a indwelling right femoral venous catheter, a left-sided femoral approach was selected. Site was marked, prepped with Betadine, draped in usual sterile fashion, infiltrated locally with 1% lidocaine. Under real-time ultrasound guidance, the left femoral artery was accessed with a 21-gauge micropuncture needle, exchanged over a 018 guidewire for a transitional dilator, through which a 035 guidewire was advanced. A 5 French vascular sheath was placed. Through this a 5 French C2 catheter was coaxial advanced in used to selectively catheterize the celiac axis for selective arteriography. An angled Glidewire was advanced into the gastroduodenal artery but the C2 catheter could not be adequately advanced distally. A coaxial renegade  micro catheter with transient guidewire was then advanced through the C2 and used to selectively catheterize the gastroduodenal artery. Coil embolization of the GDA performed with 3-5 mm coils the cessation of flow. After final arteriography through the C2 catheter, the catheter and sheath were removed and hemostasis achieved with the aid of the Exoseal device after confirmatory femoral arteriography. The patient tolerated the procedure well. COMPLICATIONS: None  immediate FINDINGS: Brisk bleeding from a distal branch of the gastroduodenal artery was evident on the initial arteriogram, into what is probably the lumen of the duodenum. The vessel was selectively catheterized and coil embolization performed until cessation of flow through the treated segment and no further extravasation evident. There is elsewhere diffuse arterial vasospasm. No other pathologic regions identified. IMPRESSION: 1. Brisk active arterial extravasation from a branch of the gastroduodenal artery probably into the duodenal lumen. 2. Technically successful coil embolization of the gastroduodenal artery with cessation of extravasation. Electronically Signed   By: Lucrezia Europe M.D.   On: 04/30/2015 12:08   Dg Chest Port 1 View  05/01/2015  CLINICAL DATA:  Endotracheal tube placement EXAM: PORTABLE CHEST 1 VIEW COMPARISON:  04/29/2015 FINDINGS: Grossly unchanged enlarged cardiac silhouette and mediastinal contours with atherosclerotic plaque within the thoracic aorta. Endotracheal tube overlies tracheal air column with tip approximately 1.7 cm above the carina. Enteric tube tip and side port project below the left hemidiaphragm. Worsening bibasilar heterogeneous/consolidative opacities, left greater than right. No definite pleural effusion. No pneumothorax. No definite evidence of edema. Unchanged bones. IMPRESSION: 1. Appropriately positioned support apparatus as above. No pneumothorax. 2. Worsening bibasilar heterogeneous opacities, left  greater than right, atelectasis versus infiltrate. 3. No evidence of edema. Electronically Signed   By: Sandi Mariscal M.D.   On: 05/01/2015 07:22   Dg Chest Port 1 View  04/29/2015  CLINICAL DATA:  Post intubation. EXAM: PORTABLE CHEST 1 VIEW COMPARISON:  04/29/2015 FINDINGS: Interval placement of an endotracheal tube with tip measuring 2.9 cm above the carina. Shallow inspiration. Normal heart size and pulmonary vascularity. Lungs appear clear. No blunting of costophrenic angles. No pneumothorax. Tortuous aorta. Surgical clips in the right upper quadrant. IMPRESSION: Endotracheal tube tip measures 2.9 cm above the carina. Shallow inspiration. No evidence of active pulmonary disease. Electronically Signed   By: Lucienne Capers M.D.   On: 04/29/2015 22:52   Dg Abd Portable 1v  04/29/2015  CLINICAL DATA:  81 year old female with bloody stool and vomiting blood EXAM: PORTABLE ABDOMEN - 1 VIEW COMPARISON:  None. FINDINGS: Single upright radiograph demonstrates gas within the stomach. No free air identified. External defibrillator pads project over the left chest and right upper quadrant. The heart is likely mildly enlarged. The lungs appear clear. No acute osseous abnormality. IMPRESSION: 1. No free air visualized. 2. Mild cardiomegaly. 3. The lungs appear clear. Electronically Signed   By: Jacqulynn Cadet M.D.   On: 04/29/2015 21:43   [place] Guide Roadmapping  04/30/2015  CLINICAL DATA:  Large volume upper GI bleed. Endoscopy revealed probable duodenal ulcer has active source, incompletely controlled. EXAM: IR EMBO ART VEN HEMORR LYMPH EXTRAV INC GUIDE ROADMAPPING; IR ULTRASOUND GUIDANCE VASC ACCESS LEFT; ARTERIOGRAPHY; SELECTIVE VISCERAL ARTERIOGRAPHY; ADDITIONAL ARTERIOGRAPHY ANESTHESIA/SEDATION: Intravenous Fentanyl and Versed had been administered for previous procedure. No additional medications were administered. Radiology RN provided continuous cardiorespiratory  monitoring. MEDICATIONS: Lidocaine 1% subcutaneous 5 mL CONTRAST:  78mL OMNIPAQUE IOHEXOL 300 MG/ML  SOLN PROCEDURE: The procedure, risks (including but not limited to bleeding, infection, organ damage ), benefits, and alternatives were explained to the spouse and daughter. Questions regarding the procedure were encouraged and answered. The family understands and consents to the procedure. Because of a indwelling right femoral venous catheter, a left-sided femoral approach was selected. Site was marked, prepped with Betadine, draped in usual sterile fashion, infiltrated locally with 1% lidocaine. Under real-time ultrasound guidance, the left femoral artery  was accessed with a 21-gauge micropuncture needle, exchanged over a 018 guidewire for a transitional dilator, through which a 035 guidewire was advanced. A 5 French vascular sheath was placed. Through this a 5 French C2 catheter was coaxial advanced in used to selectively catheterize the celiac axis for selective arteriography. An angled Glidewire was advanced into the gastroduodenal artery but the C2 catheter could not be adequately advanced distally. A coaxial renegade micro catheter with transient guidewire was then advanced through the C2 and used to selectively catheterize the gastroduodenal artery. Coil embolization of the GDA performed with 3-5 mm coils the cessation of flow. After final arteriography through the C2 catheter, the catheter and sheath were removed and hemostasis achieved with the aid of the Exoseal device after confirmatory femoral arteriography. The patient tolerated the procedure well. COMPLICATIONS: None immediate FINDINGS: Brisk bleeding from a distal branch of the gastroduodenal artery was evident on the initial arteriogram, into what is probably the lumen of the duodenum. The vessel was selectively catheterized and coil embolization performed until cessation of flow through the treated segment and no further extravasation evident. There  is elsewhere diffuse arterial vasospasm. No other pathologic regions identified. IMPRESSION: 1. Brisk active arterial extravasation from a branch of the gastroduodenal artery probably into the duodenal lumen. 2. Technically successful coil embolization of the gastroduodenal artery with cessation of extravasation. Electronically Signed   By: Lucrezia Europe M.D.   On: 04/30/2015 12:08    Anti-infectives: Anti-infectives    None      Assessment/Plan: s/p Procedure(s): ESOPHAGOGASTRODUODENOSCOPY (EGD) (N/A)  Bleeding DU s/p IR embolization of the GDA. Still may have slight ooze but not frank hemorrhage.  Will hopefully subside without need for laparotomy Remains critically ill  LOS: 2 days    Sophia [name] A 05/01/2015

## 2015-05-01 NOTE — Progress Notes (Signed)
Echocardiogram 2D Echocardiogram has been performed.  Joelene Millin 05/01/2015, 10:12 AM

## 2015-05-01 NOTE — Progress Notes (Signed)
Wasted 184ml Fentanyl with Loma Newton, RN.

## 2015-05-01 NOTE — Progress Notes (Signed)
CRITICAL VALUE ALERT  Critical value received:  CA 5.7  Date of notification:  05/01/15   Time of notification: 0738  Critical value read back:  no: [id number]  Nurse who received alert:  Jannifer Franklin  MD notified (1st page):   Time of first page:   MD notified (2nd page):  Time of second page:  Responding MD:  Gaylyn Lambert, NP  Time MD responded: [phone]

## 2015-05-01 NOTE — Progress Notes (Signed)
Patient ID: Sophia [name], female   DOB: October 22, 1931, 81 y.o.   MRN: [id number]    Subjective:  Intubated / Sedated   Objective:  Filed Vitals:   05/01/15 0645 05/01/15 0700 05/01/15 0722 05/01/15 0807  BP: 99/58 92/70 96/64    Pulse: 76 77 81   Temp:    99.3 F (37.4 C)  TempSrc:    Oral  Resp: 24 24 24    Weight:      SpO2: 99% 99% 98%     Intake/Output from previous day:  Intake/Output Summary (Last 24 hours) at 05/01/15 0850 Last data filed at 05/01/15 0700  Gross per 24 hour  Intake 5212.5 ml  Output   1195 ml  Net 4017.5 ml    Physical Exam: Pale overweight white female  HEENT: normal Neck supple with no adenopathy JVP normal no bruits no thyromegaly Lungs rhonchi Heart:  S1/S2 no murmur, no rub, gallop or click PMI normal Abdomen: benighn, BS positve, no tenderness, no AAA no bruit.  No HSM or HJR Distal pulses intact with no bruits No edema Neuro non-focal RFV catheter    Lab Results: Basic Metabolic Panel:  Recent Labs  04/30/15 0250 04/30/15 1511 05/01/15 0630  NA 143 146* 144  K 4.2 4.2 3.6  CL 126* 122* 117*  CO2 7* 18* 21*  GLUCOSE 422* 199* 189*  BUN 29* 31* 41*  CREATININE 1.37* 2.07* 2.49*  CALCIUM 4.9* 5.9* 5.7*  MG 1.3*  --  1.7  PHOS 3.1  --  2.1*   Liver Function Tests:  Recent Labs  04/29/15 1930  AST 24  ALT 15  ALKPHOS 37*  BILITOT 0.3  PROT 4.6*  ALBUMIN 2.1*   CBC:  Recent Labs  04/30/15 1945 05/01/15 0253 05/01/15 0630  WBC 16.9*  --  17.2*  NEUTROABS  --   --  14.8*  HGB 7.4* 6.3* 8.1*  HCT 21.5* 18.1* 23.0*  MCV 80.2  --  82.4  PLT 118*  --  99*   Cardiac Enzymes:  Recent Labs  04/30/15 1511 04/30/15 1945 05/01/15 0253  TROPONINI 3.54* 4.13* 2.29*    Imaging: Ir Angiogram Visceral Selective  04/30/2015  CLINICAL DATA:  Large volume upper GI bleed. Endoscopy revealed probable duodenal ulcer has active source, incompletely controlled. EXAM: IR EMBO ART VEN HEMORR LYMPH EXTRAV INC GUIDE  ROADMAPPING; IR ULTRASOUND GUIDANCE VASC ACCESS LEFT; ARTERIOGRAPHY; SELECTIVE VISCERAL ARTERIOGRAPHY; ADDITIONAL ARTERIOGRAPHY ANESTHESIA/SEDATION: Intravenous Fentanyl and Versed had been administered for previous procedure. No additional medications were administered. Radiology RN provided continuous cardiorespiratory monitoring. MEDICATIONS: Lidocaine 1% subcutaneous 5 mL CONTRAST:  51mL OMNIPAQUE IOHEXOL 300 MG/ML  SOLN PROCEDURE: The procedure, risks (including but not limited to bleeding, infection, organ damage ), benefits, and alternatives were explained to the spouse and daughter. Questions regarding the procedure were encouraged and answered. The family understands and consents to the procedure. Because of a indwelling right femoral venous catheter, a left-sided femoral approach was selected. Site was marked, prepped with Betadine, draped in usual sterile fashion, infiltrated locally with 1% lidocaine. Under real-time ultrasound guidance, the left femoral artery was accessed with a 21-gauge micropuncture needle, exchanged over a 018 guidewire for a transitional dilator, through which a 035 guidewire was advanced. A 5 French vascular sheath was placed. Through this a 5 French C2 catheter was coaxial advanced in used to selectively catheterize the celiac axis for selective arteriography. An angled Glidewire was advanced into the gastroduodenal artery but the C2 catheter could not be adequately advanced distally.  A coaxial renegade micro catheter with transient guidewire was then advanced through the C2 and used to selectively catheterize the gastroduodenal artery. Coil embolization of the GDA performed with 3-5 mm coils the cessation of flow. After final arteriography through the C2 catheter, the catheter and sheath were removed and hemostasis achieved with the aid of the Exoseal device after confirmatory femoral arteriography. The patient tolerated the procedure well. COMPLICATIONS: None immediate  FINDINGS: Brisk bleeding from a distal branch of the gastroduodenal artery was evident on the initial arteriogram, into what is probably the lumen of the duodenum. The vessel was selectively catheterized and coil embolization performed until cessation of flow through the treated segment and no further extravasation evident. There is elsewhere diffuse arterial vasospasm. No other pathologic regions identified. IMPRESSION: 1. Brisk active arterial extravasation from a branch of the gastroduodenal artery probably into the duodenal lumen. 2. Technically successful coil embolization of the gastroduodenal artery with cessation of extravasation. Electronically Signed   By: Lucrezia Europe M.D.   On: 04/30/2015 12:08   Ir Angiogram Selective Each Additional Vessel  04/30/2015  CLINICAL DATA:  Large volume upper GI bleed. Endoscopy revealed probable duodenal ulcer has active source, incompletely controlled. EXAM: IR EMBO ART VEN HEMORR LYMPH EXTRAV INC GUIDE ROADMAPPING; IR ULTRASOUND GUIDANCE VASC ACCESS LEFT; ARTERIOGRAPHY; SELECTIVE VISCERAL ARTERIOGRAPHY; ADDITIONAL ARTERIOGRAPHY ANESTHESIA/SEDATION: Intravenous Fentanyl and Versed had been administered for previous procedure. No additional medications were administered. Radiology RN provided continuous cardiorespiratory monitoring. MEDICATIONS: Lidocaine 1% subcutaneous 5 mL CONTRAST:  17mL OMNIPAQUE IOHEXOL 300 MG/ML  SOLN PROCEDURE: The procedure, risks (including but not limited to bleeding, infection, organ damage ), benefits, and alternatives were explained to the spouse and daughter. Questions regarding the procedure were encouraged and answered. The family understands and consents to the procedure. Because of a indwelling right femoral venous catheter, a left-sided femoral approach was selected. Site was marked, prepped with Betadine, draped in usual sterile fashion, infiltrated locally with 1% lidocaine. Under real-time ultrasound guidance, the left femoral  artery was accessed with a 21-gauge micropuncture needle, exchanged over a 018 guidewire for a transitional dilator, through which a 035 guidewire was advanced. A 5 French vascular sheath was placed. Through this a 5 French C2 catheter was coaxial advanced in used to selectively catheterize the celiac axis for selective arteriography. An angled Glidewire was advanced into the gastroduodenal artery but the C2 catheter could not be adequately advanced distally. A coaxial renegade micro catheter with transient guidewire was then advanced through the C2 and used to selectively catheterize the gastroduodenal artery. Coil embolization of the GDA performed with 3-5 mm coils the cessation of flow. After final arteriography through the C2 catheter, the catheter and sheath were removed and hemostasis achieved with the aid of the Exoseal device after confirmatory femoral arteriography. The patient tolerated the procedure well. COMPLICATIONS: None immediate FINDINGS: Brisk bleeding from a distal branch of the gastroduodenal artery was evident on the initial arteriogram, into what is probably the lumen of the duodenum. The vessel was selectively catheterized and coil embolization performed until cessation of flow through the treated segment and no further extravasation evident. There is elsewhere diffuse arterial vasospasm. No other pathologic regions identified. IMPRESSION: 1. Brisk active arterial extravasation from a branch of the gastroduodenal artery probably into the duodenal lumen. 2. Technically successful coil embolization of the gastroduodenal artery with cessation of extravasation. Electronically Signed   By: Lucrezia Europe M.D.   On: 04/30/2015 12:08   Ir Angiogram Follow Up Study  04/30/2015  CLINICAL DATA:  Large volume upper GI bleed. Endoscopy revealed probable duodenal ulcer has active source, incompletely controlled. EXAM: IR EMBO ART VEN HEMORR LYMPH EXTRAV INC GUIDE ROADMAPPING; IR ULTRASOUND GUIDANCE VASC  ACCESS LEFT; ARTERIOGRAPHY; SELECTIVE VISCERAL ARTERIOGRAPHY; ADDITIONAL ARTERIOGRAPHY ANESTHESIA/SEDATION: Intravenous Fentanyl and Versed had been administered for previous procedure. No additional medications were administered. Radiology RN provided continuous cardiorespiratory monitoring. MEDICATIONS: Lidocaine 1% subcutaneous 5 mL CONTRAST:  16mL OMNIPAQUE IOHEXOL 300 MG/ML  SOLN PROCEDURE: The procedure, risks (including but not limited to bleeding, infection, organ damage ), benefits, and alternatives were explained to the spouse and daughter. Questions regarding the procedure were encouraged and answered. The family understands and consents to the procedure. Because of a indwelling right femoral venous catheter, a left-sided femoral approach was selected. Site was marked, prepped with Betadine, draped in usual sterile fashion, infiltrated locally with 1% lidocaine. Under real-time ultrasound guidance, the left femoral artery was accessed with a 21-gauge micropuncture needle, exchanged over a 018 guidewire for a transitional dilator, through which a 035 guidewire was advanced. A 5 French vascular sheath was placed. Through this a 5 French C2 catheter was coaxial advanced in used to selectively catheterize the celiac axis for selective arteriography. An angled Glidewire was advanced into the gastroduodenal artery but the C2 catheter could not be adequately advanced distally. A coaxial renegade micro catheter with transient guidewire was then advanced through the C2 and used to selectively catheterize the gastroduodenal artery. Coil embolization of the GDA performed with 3-5 mm coils the cessation of flow. After final arteriography through the C2 catheter, the catheter and sheath were removed and hemostasis achieved with the aid of the Exoseal device after confirmatory femoral arteriography. The patient tolerated the procedure well. COMPLICATIONS: None immediate FINDINGS: Brisk bleeding from a distal branch of  the gastroduodenal artery was evident on the initial arteriogram, into what is probably the lumen of the duodenum. The vessel was selectively catheterized and coil embolization performed until cessation of flow through the treated segment and no further extravasation evident. There is elsewhere diffuse arterial vasospasm. No other pathologic regions identified. IMPRESSION: 1. Brisk active arterial extravasation from a branch of the gastroduodenal artery probably into the duodenal lumen. 2. Technically successful coil embolization of the gastroduodenal artery with cessation of extravasation. Electronically Signed   By: Lucrezia Europe M.D.   On: 04/30/2015 12:08   Ir US Guide Vasc Access Left  04/30/2015  CLINICAL DATA:  Large volume upper GI bleed. Endoscopy revealed probable duodenal ulcer has active source, incompletely controlled. EXAM: IR EMBO ART VEN HEMORR LYMPH EXTRAV INC GUIDE ROADMAPPING; IR ULTRASOUND GUIDANCE VASC ACCESS LEFT; ARTERIOGRAPHY; SELECTIVE VISCERAL ARTERIOGRAPHY; ADDITIONAL ARTERIOGRAPHY ANESTHESIA/SEDATION: Intravenous Fentanyl and Versed had been administered for previous procedure. No additional medications were administered. Radiology RN provided continuous cardiorespiratory monitoring. MEDICATIONS: Lidocaine 1% subcutaneous 5 mL CONTRAST:  29mL OMNIPAQUE IOHEXOL 300 MG/ML  SOLN PROCEDURE: The procedure, risks (including but not limited to bleeding, infection, organ damage ), benefits, and alternatives were explained to the spouse and daughter. Questions regarding the procedure were encouraged and answered. The family understands and consents to the procedure. Because of a indwelling right femoral venous catheter, a left-sided femoral approach was selected. Site was marked, prepped with Betadine, draped in usual sterile fashion, infiltrated locally with 1% lidocaine. Under real-time ultrasound guidance, the left femoral artery was accessed with a 21-gauge micropuncture needle, exchanged  over a 018 guidewire for a transitional dilator, through which a 035 guidewire was advanced. A 5 French vascular sheath  was placed. Through this a 5 French C2 catheter was coaxial advanced in used to selectively catheterize the celiac axis for selective arteriography. An angled Glidewire was advanced into the gastroduodenal artery but the C2 catheter could not be adequately advanced distally. A coaxial renegade micro catheter with transient guidewire was then advanced through the C2 and used to selectively catheterize the gastroduodenal artery. Coil embolization of the GDA performed with 3-5 mm coils the cessation of flow. After final arteriography through the C2 catheter, the catheter and sheath were removed and hemostasis achieved with the aid of the Exoseal device after confirmatory femoral arteriography. The patient tolerated the procedure well. COMPLICATIONS: None immediate FINDINGS: Brisk bleeding from a distal branch of the gastroduodenal artery was evident on the initial arteriogram, into what is probably the lumen of the duodenum. The vessel was selectively catheterized and coil embolization performed until cessation of flow through the treated segment and no further extravasation evident. There is elsewhere diffuse arterial vasospasm. No other pathologic regions identified. IMPRESSION: 1. Brisk active arterial extravasation from a branch of the gastroduodenal artery probably into the duodenal lumen. 2. Technically successful coil embolization of the gastroduodenal artery with cessation of extravasation. Electronically Signed   By: Lucrezia Europe M.D.   On: 04/30/2015 12:08   Dg Chest Port 1 View  05/01/2015  CLINICAL DATA:  Endotracheal tube placement EXAM: PORTABLE CHEST 1 VIEW COMPARISON:  04/29/2015 FINDINGS: Grossly unchanged enlarged cardiac silhouette and mediastinal contours with atherosclerotic plaque within the thoracic aorta. Endotracheal tube overlies tracheal air column with tip approximately  1.7 cm above the carina. Enteric tube tip and side port project below the left hemidiaphragm. Worsening bibasilar heterogeneous/consolidative opacities, left greater than right. No definite pleural effusion. No pneumothorax. No definite evidence of edema. Unchanged bones. IMPRESSION: 1. Appropriately positioned support apparatus as above. No pneumothorax. 2. Worsening bibasilar heterogeneous opacities, left greater than right, atelectasis versus infiltrate. 3. No evidence of edema. Electronically Signed   By: Sandi Mariscal M.D.   On: 05/01/2015 07:22   Dg Chest Port 1 View  04/29/2015  CLINICAL DATA:  Post intubation. EXAM: PORTABLE CHEST 1 VIEW COMPARISON:  04/29/2015 FINDINGS: Interval placement of an endotracheal tube with tip measuring 2.9 cm above the carina. Shallow inspiration. Normal heart size and pulmonary vascularity. Lungs appear clear. No blunting of costophrenic angles. No pneumothorax. Tortuous aorta. Surgical clips in the right upper quadrant. IMPRESSION: Endotracheal tube tip measures 2.9 cm above the carina. Shallow inspiration. No evidence of active pulmonary disease. Electronically Signed   By: Lucienne Capers M.D.   On: 04/29/2015 22:52   Dg Abd Portable 1v  04/29/2015  CLINICAL DATA:  81 year old female with bloody stool and vomiting blood EXAM: PORTABLE ABDOMEN - 1 VIEW COMPARISON:  None. FINDINGS: Single upright radiograph demonstrates gas within the stomach. No free air identified. External defibrillator pads project over the left chest and right upper quadrant. The heart is likely mildly enlarged. The lungs appear clear. No acute osseous abnormality. IMPRESSION: 1. No free air visualized. 2. Mild cardiomegaly. 3. The lungs appear clear. Electronically Signed   By: Jacqulynn Cadet M.D.   On: 04/29/2015 21:43   [place] Guide Roadmapping  04/30/2015  CLINICAL DATA:  Large volume upper GI bleed. Endoscopy revealed probable duodenal ulcer has active  source, incompletely controlled. EXAM: IR EMBO ART VEN HEMORR LYMPH EXTRAV INC GUIDE ROADMAPPING; IR ULTRASOUND GUIDANCE VASC ACCESS LEFT; ARTERIOGRAPHY; SELECTIVE VISCERAL ARTERIOGRAPHY; ADDITIONAL ARTERIOGRAPHY ANESTHESIA/SEDATION: Intravenous  Fentanyl and Versed had been administered for previous procedure. No additional medications were administered. Radiology RN provided continuous cardiorespiratory monitoring. MEDICATIONS: Lidocaine 1% subcutaneous 5 mL CONTRAST:  34mL OMNIPAQUE IOHEXOL 300 MG/ML  SOLN PROCEDURE: The procedure, risks (including but not limited to bleeding, infection, organ damage ), benefits, and alternatives were explained to the spouse and daughter. Questions regarding the procedure were encouraged and answered. The family understands and consents to the procedure. Because of a indwelling right femoral venous catheter, a left-sided femoral approach was selected. Site was marked, prepped with Betadine, draped in usual sterile fashion, infiltrated locally with 1% lidocaine. Under real-time ultrasound guidance, the left femoral artery was accessed with a 21-gauge micropuncture needle, exchanged over a 018 guidewire for a transitional dilator, through which a 035 guidewire was advanced. A 5 French vascular sheath was placed. Through this a 5 French C2 catheter was coaxial advanced in used to selectively catheterize the celiac axis for selective arteriography. An angled Glidewire was advanced into the gastroduodenal artery but the C2 catheter could not be adequately advanced distally. A coaxial renegade micro catheter with transient guidewire was then advanced through the C2 and used to selectively catheterize the gastroduodenal artery. Coil embolization of the GDA performed with 3-5 mm coils the cessation of flow. After final arteriography through the C2 catheter, the catheter and sheath were removed and hemostasis achieved with the aid of the Exoseal device after confirmatory femoral  arteriography. The patient tolerated the procedure well. COMPLICATIONS: None immediate FINDINGS: Brisk bleeding from a distal branch of the gastroduodenal artery was evident on the initial arteriogram, into what is probably the lumen of the duodenum. The vessel was selectively catheterized and coil embolization performed until cessation of flow through the treated segment and no further extravasation evident. There is elsewhere diffuse arterial vasospasm. No other pathologic regions identified. IMPRESSION: 1. Brisk active arterial extravasation from a branch of the gastroduodenal artery probably into the duodenal lumen. 2. Technically successful coil embolization of the gastroduodenal artery with cessation of extravasation. Electronically Signed   By: Lucrezia Europe M.D.   On: 04/30/2015 12:08    Cardiac Studies:  ECG:  SR nonspecific ST changes   Telemetry:  ST no PAF or VT  Echo: pending   Medications:   . sodium chloride   Intravenous Once  . sodium chloride   Intravenous Once  . sodium chloride  10 mL/hr Intravenous Once  . sodium chloride  10 mL/hr Intravenous Once  . sodium chloride   Intravenous Once  . antiseptic oral rinse  7 mL Mouth Rinse QID  . chlorhexidine gluconate  15 mL Mouth Rinse BID  . fentaNYL (SUBLIMAZE) injection  50 mcg Intravenous Once  . fentaNYL (SUBLIMAZE) injection  50 mcg Intravenous Once  . ketamine  2 mg/kg Intravenous Once  . levothyroxine  12.5 mcg Intravenous Daily  . midazolam  1 mg Intravenous Once  . [START ON 05/03/2015] pantoprazole (PROTONIX) IV  40 mg Intravenous Q12H     . fentaNYL infusion INTRAVENOUS Stopped (05/01/15 0746)  . norepinephrine (LEVOPHED) Adult infusion Stopped (04/30/15 1200)  . pantoprozole (PROTONIX) infusion 8 mg/hr (05/01/15 0747)  . phenylephrine (NEO-SYNEPHRINE) Adult infusion    .  sodium bicarbonate  infusion 1000 mL 100 mL/hr at 05/01/15 0352  . vasopressin (PITRESSIN) infusion - *FOR SHOCK* 0.03 Units/min (05/01/15 0000)     Assessment/Plan:   Elevated troponin:  Peaked around 4  Currently not a candidate for any invasive evaluation or antiplatelet Rx  Echo pending Lab  Results  Component Value Date   TROPONINI 2.29* 05/01/2015   GI Bleed:  Hb drifting down transfuse another unit.  S/P GDA embolization continue iv protonix  Valentina Shaggy 05/01/2015, 8:50 AM

## 2015-05-01 NOTE — Progress Notes (Signed)
PULMONARY / CRITICAL CARE MEDICINE   Name: Sophia [name] MRN: [id number] DOB: 1931/09/08    ADMISSION DATE:  04/29/2015 CONSULTATION DATE:  04/29/15  REFERRING MD:  EDP  CHIEF COMPLAINT:  GI bleed  HISTORY OF PRESENT ILLNESS:   Sophia [name] is an 81 y.o. F with PMH as outlined below.  She was brought to [facility] ED 12/12 due to near syncopal episode along with melena.  She did fall and suffered a laceration to her forehead.    On arrival to ED, she was apparently covered in melena and blood.  Once in ED stretcher, she began to have active hematemesis.  SBP was in the 70's and Hgb 6.5 (was 11.0 just 3 days prior).  She was given 4L IVF and had 4u PRBC ordered.  GI was called and planning for bedside endoscopy in ED.  PCCM called for admission.  SUBJECTIVE:   Awake, required transfusion  VITAL SIGNS: BP 97/64 mmHg  Pulse 118  Temp(Src) 99.3 F (37.4 C) (Oral)  Resp 21  Wt 173 lb 11.6 oz (78.8 kg)  SpO2 99%  HEMODYNAMICS: CVP:  [6 mmHg-16 mmHg] 14 mmHg  VENTILATOR SETTINGS: Vent Mode:  [-] CPAP;PSV FiO2 (%):  [40 %] 40 % Set Rate:  [24 bmp] 24 bmp Vt Set:  [440 mL] 440 mL PEEP:  [5 cmH20] 5 cmH20 Pressure Support:  [5 cmH20] 5 cmH20 Plateau Pressure:  [14 cmH20-17 cmH20] 17 cmH20  INTAKE / OUTPUT: I/O last 3 completed shifts: In: 15511.6 [I.V.:10749.6; AB:7256751; NG/GT:2180; IV Piggyback:1100] Out: [id number] [Urine:960; Emesis/NG output:2650]   PHYSICAL EXAMINATION: General: Elderly female, in NAD,follows commands and intubated. Neuro: A&O x 3, non-focal.  HEENT: [place]/AT. PERRL, sclerae anicteric. Cardiovascular: regular, no M/R/G.  Lungs: Respirations even and unlabored.  Coarse BS diffusely. Abdomen: BS x 4, soft, NT/ND. Continues to have dark stools Musculoskeletal: No gross deformities, no edema.  Skin: Pale, warm, no rashes.  LABS:  BMET  Recent Labs Lab 04/30/15 0250 04/30/15 1511 05/01/15 0630  NA 143 146* 144  K 4.2 4.2 3.6  CL 126* 122* 117*   CO2 7* 18* 21*  BUN 29* 31* 41*  CREATININE 1.37* 2.07* 2.49*  GLUCOSE 422* 199* 189*   Electrolytes  Recent Labs Lab 04/30/15 0250 04/30/15 1511 05/01/15 0630  CALCIUM 4.9* 5.9* 5.7*  MG 1.3*  --  1.7  PHOS 3.1  --  2.1*   CBC  Recent Labs Lab 04/30/15 1530 04/30/15 1945 05/01/15 0253 05/01/15 0630  WBC 18.1* 16.9*  --  17.2*  HGB 8.1* 7.4* 6.3* 8.1*  HCT 23.4* 21.5* 18.1* 23.0*  PLT 127* 118*  --  99*    Coag's  Recent Labs Lab 04/29/15 1930 04/30/15 0250 04/30/15 1530  APTT 30  --  38*  INR 1.48 3.43* 1.97*   Sepsis Markers  Recent Labs Lab 04/30/15 0249 05/01/15 0030  LATICACIDVEN 10.3* 2.6*   ABG  Recent Labs Lab 04/30/15 1451 05/01/15 0420  PHART 7.290* 7.460*  PCO2ART 31.8* 26.8*  PO2ART 122.0* 88.4    Liver Enzymes  Recent Labs Lab 04/26/15 1539 04/29/15 1930  AST 20 24  ALT 12 15  ALKPHOS 65 37*  BILITOT 0.4 0.3  ALBUMIN 3.6 2.1*   Cardiac Enzymes  Recent Labs Lab 04/30/15 1511 04/30/15 1945 05/01/15 0253  TROPONINI 3.54* 4.13* 2.29*   Glucose No results for input(s): GLUCAP in the last 168 hours.  Imaging Dg Chest Port 1 View  05/01/2015  CLINICAL DATA:  Endotracheal tube placement EXAM: PORTABLE  CHEST 1 VIEW COMPARISON:  04/29/2015 FINDINGS: Grossly unchanged enlarged cardiac silhouette and mediastinal contours with atherosclerotic plaque within the thoracic aorta. Endotracheal tube overlies tracheal air column with tip approximately 1.7 cm above the carina. Enteric tube tip and side port project below the left hemidiaphragm. Worsening bibasilar heterogeneous/consolidative opacities, left greater than right. No definite pleural effusion. No pneumothorax. No definite evidence of edema. Unchanged bones. IMPRESSION: 1. Appropriately positioned support apparatus as above. No pneumothorax. 2. Worsening bibasilar heterogeneous opacities, left greater than right, atelectasis versus infiltrate. 3. No evidence of edema.  Electronically Signed   By: Sandi Mariscal M.D.   On: 05/01/2015 07:22     STUDIES:  AXR 12/12 > no free air. CXR 12/12 >  CULTURES: None  ANTIBIOTICS: None  SIGNIFICANT EVENTS: 12/12 > admitted with UGIB 12/14 required transfusion  LINES/TUBES: ETT 12/12 > R femoral 12/12 >  ASSESSMENT / PLAN:  GASTROINTESTINAL A:   UGIB of unclear etiology. GI prophylaxis. Nutrition. S/P IR procedure. P:   - Embolized via IR. - Surgery following, no surgical interventions for now. May need in future as she continues to ooze.  - Repeat CBC noted. - NPO.  HEMATOLOGIC  Recent Labs  05/01/15 0630 05/01/15 0930  HGB 8.1* 8.1*   A:   Acute blood loss anemia. Hgb 6. VTE Prophylaxis. P:  H/H q6hrs. Transfuse for Hgb < 7. Transfused 12/14 for low hgb SCD's only. CBC in AM.  PULMONARY A: VDRF due to inability to protect airway. P:   Full vent support. Wean as able. Consider extubation 12/14  CARDIOVASCULAR A:  Shock - presumed hemorrhagic due to UGIB. Hx HTN, HLD. Troponin 2.13 with EKG with no evidence of ST segment elevations, likely demand ischemia.Peaked at 4 P:  Levophed as needed for goal MAP > 65. OFF Vasopressin 0.03. DC 12/14 Lactate of 10.3.-> 2.4 Hold outpatient ASA, simvastatin, diovan. Cards consulted . No intervention with GIB possible.  RENAL Lab Results  Component Value Date   CREATININE 2.49* 05/01/2015   CREATININE 2.07* 04/30/2015   CREATININE 1.37* 04/30/2015   CREATININE 1.28* 10/24/2012   A:   CKD. Metabolic acidosis mixed gap/non-gap. Hypocalcemia. Corrected ca nl  P:    Decrease  bicarb drip 12/14, continue till lactic acid <2 D/C NS patient already has hyperchloremic metabolic acidosis. BMP  Replace electrolytes as indicated.  INFECTIOUS A:   No indication of infection. P:   Monitor clinically.  ENDOCRINE A:   DM - not on outpatient meds.   Hypothyroidism. TSH in parameters P:   Assess Hgb A1c. Continue outpatient  synthroid, change to IV formulation.  NEUROLOGIC A:   Acute metabolic encephalopathy due to sedation. Improved 12/14. Off sedation and follows commands P:   Sedation:  Fentanyl gtt / Midazolam PRN. RASS goal: 0 to -1. Daily WUA. May be able to extubate 12/14  Family updated: No family bedside.  Interdisciplinary Family Meeting v Palliative Care Meeting:  Due by: 12/18.  Richardson Landry Minor ACNP Maryanna Shape PCCM Pager [phone] till 3 pm If no answer page [phone]  Attending Note:  81 year old female with GI bleeding s/p embolization of GDA.  Hg now more stable.  GI and surgery on board.  Hemodynamically more stable.  Off pressors.  Remains intubated.  Weaning.  Hg continues to drop.  Cards saw patient, no interventions at this time.  Off pressors.  Bicarb improving, will decrease to 50 ml/hr.  Will extubate once TLC is placed.  I am concerned that once femoral  TLC is out and patient bleeds again then will be at risk.  Hold in the ICU.  Awake and interactive on exam.    The patient is critically ill with multiple organ systems failure and requires high complexity decision making for assessment and support, frequent evaluation and titration of therapies, application of advanced monitoring technologies and extensive interpretation of multiple databases.   Critical Care Time devoted to patient care services described in this note is  35  Minutes. This time reflects time of care of this signee Dr Jennet Maduro. This critical care time does not reflect procedure time, or teaching time or supervisory time of PA/NP/Med student/Med Resident etc but could involve care discussion time.  Rush Farmer, M.D. [facility] Pulmonary/Critical Care Medicine. Pager: [phone]. After hours pager: [phone].  05/01/2015, 9:35 AM

## 2015-05-01 NOTE — Progress Notes (Signed)
Inpatient Diabetes Program Recommendations  AACE/ADA: New Consensus Statement on Inpatient Glycemic Control (2015)  Target Ranges:  Prepandial:   less than 140 mg/dL      Peak postprandial:   less than 180 mg/dL (1-2 hours)      Critically ill patients:  140 - 180 mg/dL  Results for Sophia [name], Sophia [name] (MRN [id number]) as of 05/01/2015 10:01  Ref. Range 04/29/2015 19:30 04/29/2015 20:12 04/30/2015 02:50 04/30/2015 15:11 05/01/2015 06:30  Glucose Latest Ref Range: 65-99 mg/dL 165 (H) 151 (H) 422 (H) 199 (H) 189 (H)   Review of Glycemic Control  Diabetes history: DM2 Outpatient Diabetes medications: None Current orders for Inpatient glycemic control: None  Inpatient Diabetes Program Recommendations: Correction (SSI): Patient has a history of DM2 but is not on any outpatient DM medications. While inpatient, please consider ordering CBGs with Novolog correction Q4H.  Thanks, Barnie Alderman, RN, MSN, CDE Diabetes Coordinator Inpatient Diabetes Program [phone] (Team Pager from [place] to [place]) [phone] ([facility] office) [phone] [facility] office) [phone] [facility] office)

## 2015-05-01 NOTE — Progress Notes (Signed)
Initial Nutrition Assessment  DOCUMENTATION CODES:   Not applicable  INTERVENTION:    When able to begin enteral nutrition, recommend Vital AF 1.2 via OGT with goal rate of 45 ml/h (1080 ml per day) with Prostat 30 ml once daily to provide 1396 kcals, 96 gm protein, 876 ml free water daily.  NUTRITION DIAGNOSIS:   Inadequate oral intake related to inability to eat as evidenced by NPO status.  GOAL:   Patient will meet greater than or equal to 90% of their needs  MONITOR:   Vent status, Labs, Weight trends, I & O's  REASON FOR ASSESSMENT:   Ventilator    ASSESSMENT:   81 y.o. F brought to [facility] ED 12/12 due to near syncopal episode along with melena. She did fall and suffered a laceration to her forehead.Admitted with GIB.   Labs reviewed: phosphorus low. Discussed patient with RN today. Surgery is following. Patient with ongoing bleeding duodenal ulcer s/p IR embolization of the GDA. Hopeful that she will not need surgery. No plans to start TF at this time.  Patient is currently intubated on ventilator support MV: 7.6 L/min Temp (24hrs), Avg:99.2 F (37.3 C), Min:98.4 F (36.9 C), Max:99.7 F (37.6 C)   Diet Order:  Diet NPO time specified  Skin:  Wound (see comment) (head laceration)  Last BM:  12/13  Height:   Ht Readings from Last 1 Encounters:  04/26/15 5\' 4"  (1.626 m)    Weight:   Wt Readings from Last 1 Encounters:  05/01/15 173 lb 11.6 oz (78.8 kg)  04/30/15 152 lb 1.9 oz (admission weight) 04/26/15 128 lbs 6.4 oz (office visit)  Ideal Body Weight:  54.5 kg  BMI:  Body mass index is 29.8 kg/(m^2).  Estimated Nutritional Needs:   Kcal:  1405  Protein:  95-110 gm  Fluid:  >/= 1.5 L  EDUCATION NEEDS:   No education needs identified at this time  Molli Barrows, [place], [place], [place] Pager [phone] After Hours Pager [phone]

## 2015-05-01 NOTE — Progress Notes (Signed)
CRITICAL VALUE ALERT  Critical value received:  hgb 6.3  Date of notification:  05/01/15  Time of notification:  0318  Critical value read back: Yes  Nurse who received alert:  Candelaria Celeste RN   MD notified (1st page): MD Mungal   Time of first page: 0323  MD notified (2nd page):  Time of second page:  Responding MD: MD Stevenson Clinch   Time MD responded: [phone]

## 2015-05-02 LAB — GLUCOSE, CAPILLARY
Glucose-Capillary: 100 mg/dL — ABNORMAL HIGH (ref 65–99)
Glucose-Capillary: 102 mg/dL — ABNORMAL HIGH (ref 65–99)
Glucose-Capillary: 121 mg/dL — ABNORMAL HIGH (ref 65–99)
Glucose-Capillary: 128 mg/dL — ABNORMAL HIGH (ref 65–99)

## 2015-05-02 LAB — CBC WITH DIFFERENTIAL/PLATELET
Basophils Absolute: 0 10*3/uL (ref 0.0–0.1)
Basophils Relative: 0 %
Eosinophils Absolute: 0 10*3/uL (ref 0.0–0.7)
Eosinophils Relative: 0 %
HCT: 22.3 % — ABNORMAL LOW (ref 36.0–46.0)
Hemoglobin: 7.5 g/dL — ABNORMAL LOW (ref 12.0–15.0)
Lymphocytes Relative: 7 %
Lymphs Abs: 1.3 10*3/uL (ref 0.7–4.0)
MCH: 28.4 pg (ref 26.0–34.0)
MCHC: 33.6 g/dL (ref 30.0–36.0)
MCV: 84.5 fL (ref 78.0–100.0)
Monocytes Absolute: 1.3 10*3/uL — ABNORMAL HIGH (ref 0.1–1.0)
Monocytes Relative: 7 %
Neutro Abs: 16.6 10*3/uL — ABNORMAL HIGH (ref 1.7–7.7)
Neutrophils Relative %: 87 %
Platelets: 95 10*3/uL — ABNORMAL LOW (ref 150–400)
RBC: 2.64 MIL/uL — ABNORMAL LOW (ref 3.87–5.11)
RDW: 17.9 % — ABNORMAL HIGH (ref 11.5–15.5)
WBC: 19.2 10*3/uL — ABNORMAL HIGH (ref 4.0–10.5)

## 2015-05-02 LAB — BASIC METABOLIC PANEL
Anion gap: 6 (ref 5–15)
BUN: 46 mg/dL — ABNORMAL HIGH (ref 6–20)
CO2: 25 mmol/L (ref 22–32)
Calcium: 6.5 mg/dL — ABNORMAL LOW (ref 8.9–10.3)
Chloride: 117 mmol/L — ABNORMAL HIGH (ref 101–111)
Creatinine, Ser: 2.59 mg/dL — ABNORMAL HIGH (ref 0.44–1.00)
GFR calc Af Amer: 19 mL/min — ABNORMAL LOW (ref >60)
GFR calc non Af Amer: 16 mL/min — ABNORMAL LOW (ref >60)
Glucose, Bld: 107 mg/dL — ABNORMAL HIGH (ref 65–99)
Potassium: 3.5 mmol/L (ref 3.5–5.1)
Sodium: 148 mmol/L — ABNORMAL HIGH (ref 135–145)

## 2015-05-02 LAB — CBC
HCT: 21.7 % — ABNORMAL LOW (ref 36.0–46.0)
Hemoglobin: 7.3 g/dL — ABNORMAL LOW (ref 12.0–15.0)
MCH: 28.4 pg (ref 26.0–34.0)
MCHC: 33.6 g/dL (ref 30.0–36.0)
MCV: 84.4 fL (ref 78.0–100.0)
Platelets: 95 10*3/uL — ABNORMAL LOW (ref 150–400)
RBC: 2.57 MIL/uL — ABNORMAL LOW (ref 3.87–5.11)
RDW: 17.8 % — ABNORMAL HIGH (ref 11.5–15.5)
WBC: 18.2 10*3/uL — ABNORMAL HIGH (ref 4.0–10.5)

## 2015-05-02 LAB — HEMOGLOBIN AND HEMATOCRIT, BLOOD
HCT: 22 % — ABNORMAL LOW (ref 36.0–46.0)
Hemoglobin: 7.4 g/dL — ABNORMAL LOW (ref 12.0–15.0)

## 2015-05-02 LAB — PHOSPHORUS: Phosphorus: 2.7 mg/dL (ref 2.5–4.6)

## 2015-05-02 LAB — BLOOD GAS, ARTERIAL
Acid-base deficit: 0.1 mmol/L (ref 0.0–2.0)
Bicarbonate: 23.7 mEq/L (ref 20.0–24.0)
Drawn by: 44135
O2 Content: 4 L/min
O2 Saturation: 98.4 %
Patient temperature: 97.9
TCO2: 24.9 mmol/L (ref 0–100)
pCO2 arterial: 35.8 mmHg (ref 35.0–45.0)
pH, Arterial: 7.435 (ref 7.350–7.450)
pO2, Arterial: 108 mmHg — ABNORMAL HIGH (ref 80.0–100.0)

## 2015-05-02 LAB — MAGNESIUM: Magnesium: 1.7 mg/dL (ref 1.7–2.4)

## 2015-05-02 NOTE — Progress Notes (Signed)
Patient ID: Sophia [name], female   DOB: July 09, 1931, 81 y.o.   MRN: [id number]    Subjective:  Extubated Lethargic   Objective:  Filed Vitals:   05/02/15 0500 05/02/15 0600 05/02/15 0700 05/02/15 0807  BP: 100/60 111/59 104/57   Pulse: 83 103 88   Temp:    98.3 F (36.8 C)  TempSrc:    Oral  Resp: 15 15 11    Weight: 77.8 kg (171 lb 8.3 oz)     SpO2: 99% 99% 99%     Intake/Output from previous day:  Intake/Output Summary (Last 24 hours) at 05/02/15 0847 Last data filed at 05/02/15 0719  Gross per 24 hour  Intake    936 ml  Output   1766 ml  Net   -830 ml    Physical Exam: Pale overweight white female  HEENT: normal Neck supple with no adenopathy JVP normal no bruits no thyromegaly Lungs rhonchi Heart:  S1/S2 no murmur, no rub, gallop or click PMI normal Abdomen: benighn, BS positve, no tenderness, no AAA NG tube to suction  no bruit.  No HSM or HJR Distal pulses intact with no bruits No edema Neuro non-focal RFV catheter    Lab Results: Basic Metabolic Panel:  Recent Labs  05/01/15 0630 05/02/15 0425  NA 144 148*  K 3.6 3.5  CL 117* 117*  CO2 21* 25  GLUCOSE 189* 107*  BUN 41* 46*  CREATININE 2.49* 2.59*  CALCIUM 5.7* 6.5*  MG 1.7 1.7  PHOS 2.1* 2.7   Liver Function Tests:  Recent Labs  04/29/15 1930  AST 24  ALT 15  ALKPHOS 37*  BILITOT 0.3  PROT 4.6*  ALBUMIN 2.1*   CBC:  Recent Labs  05/01/15 0630  05/02/15 0159 05/02/15 0425  WBC 17.2*  < > 18.2* 19.2*  NEUTROABS 14.8*  --   --  16.6*  HGB 8.1*  < > 7.3* 7.5*  HCT 23.0*  < > 21.7* 22.3*  MCV 82.4  < > 84.4 84.5  PLT 99*  < > 95* 95*  < > = values in this interval not displayed. Cardiac Enzymes:  Recent Labs  04/30/15 1511 04/30/15 1945 05/01/15 0253  TROPONINI 3.54* 4.13* 2.29*    Imaging: Dg Chest Port 1 View  05/01/2015  CLINICAL DATA:  Respiratory failure EXAM: PORTABLE CHEST 1 VIEW COMPARISON:  05/01/2015 FINDINGS: Stable endotracheal and NG tube  position. There is left IJ central line with tip in SVC. No pneumothorax. Persistent small left pleural effusion and left basilar atelectasis or infiltrate. No pulmonary edema. IMPRESSION: Stable support apparatus. Left IJ central line in place. Persistent small left pleural effusion with left basilar atelectasis or infiltrate. Electronically Signed   By: Lahoma Crocker M.D.   On: 05/01/2015 14:28   Dg Chest Port 1 View  05/01/2015  CLINICAL DATA:  Endotracheal tube placement EXAM: PORTABLE CHEST 1 VIEW COMPARISON:  04/29/2015 FINDINGS: Grossly unchanged enlarged cardiac silhouette and mediastinal contours with atherosclerotic plaque within the thoracic aorta. Endotracheal tube overlies tracheal air column with tip approximately 1.7 cm above the carina. Enteric tube tip and side port project below the left hemidiaphragm. Worsening bibasilar heterogeneous/consolidative opacities, left greater than right. No definite pleural effusion. No pneumothorax. No definite evidence of edema. Unchanged bones. IMPRESSION: 1. Appropriately positioned support apparatus as above. No pneumothorax. 2. Worsening bibasilar heterogeneous opacities, left greater than right, atelectasis versus infiltrate. 3. No evidence of edema. Electronically Signed   By: Sandi Mariscal M.D.   On: 05/01/2015 07:22  Cardiac Studies:  ECG:  SR nonspecific ST changes   Telemetry:  ST no PAF or VT  Echo: pending   Medications:   . sodium chloride   Intravenous Once  . sodium chloride   Intravenous Once  . sodium chloride  10 mL/hr Intravenous Once  . sodium chloride  10 mL/hr Intravenous Once  . sodium chloride   Intravenous Once  . antiseptic oral rinse  7 mL Mouth Rinse QID  . chlorhexidine gluconate  15 mL Mouth Rinse BID  . fentaNYL (SUBLIMAZE) injection  50 mcg Intravenous Once  . fentaNYL (SUBLIMAZE) injection  50 mcg Intravenous Once  . insulin aspart  0-15 Units Subcutaneous 6 times per day  . ketamine  2 mg/kg Intravenous Once    . levothyroxine  12.5 mcg Intravenous Daily  . midazolam  1 mg Intravenous Once  . [START ON 05/03/2015] pantoprazole (PROTONIX) IV  40 mg Intravenous Q12H     . fentaNYL infusion INTRAVENOUS 50 mcg/hr (05/01/15 1330)  . pantoprozole (PROTONIX) infusion 8 mg/hr (05/02/15 0427)    Assessment/Plan:   Elevated troponin:  Peaked around 4  Currently not a candidate for any invasive evaluation or antiplatelet Rx  Echo with normal LV function And no RWMAls   ECG nonspecific ST/T wave changes Possible demand ischemia from massive GI bleed and Hb 4.8   Lab Results  Component Value Date   TROPONINI 2.29* 05/01/2015   Anemia:  Hb still low   S/P GDA embolization continue iv protonix plan per GI/ primary service   Valentina Shaggy 05/02/2015, 8:47 AM

## 2015-05-02 NOTE — Progress Notes (Signed)
3 Days Post-Op  Subjective: She is awake and alert.  She has an NG in place and what is coming from it now is not bloody.  She is still NPO. She is hemodynamically stable and   Objective: Vital signs in last 24 hours: Temp:  [97.9 F (36.6 C)-100.1 F (37.8 C)] 98.3 F (36.8 C) (12/15 0807) Pulse Rate:  [76-128] 88 (12/15 0700) Resp:  [11-25] 11 (12/15 0700) BP: (96-136)/(52-69) 104/57 mmHg (12/15 0700) SpO2:  [96 %-100 %] 99 % (12/15 0700) FiO2 (%):  [40 %] 40 % (12/14 1108) Weight:  [77.8 kg (171 lb 8.3 oz)] 77.8 kg (171 lb 8.3 oz) (12/15 0500) Last BM Date: 05/01/15 (Per Day shift RN ) 1072 IV yesterday recorded. 1230 urine recorded, emesis 400 1 BM Afebrile, BP stable, some tachycardia, Sats good on 3 l/[place] Creatinine 2.59 H/H stable since transfusion yesterday Intake/Output from previous day: 12/14 0701 - 12/15 0700 In: 1072.3 [I.V.:986.3] Out: 1631 [Urine:1230; Emesis/NG output:400; Stool:1] Intake/Output this shift: Total I/O In: -  Out: 200 [Urine:200]  General appearance: alert, cooperative and no distress GI: soft, non-tender; bowel sounds normal; no masses,  no organomegaly and drainage currently from NG looks like normal bilious fluid.  Lab Results:   Recent Labs  05/02/15 0159 05/02/15 0425  WBC 18.2* 19.2*  HGB 7.3* 7.5*  HCT 21.7* 22.3*  PLT 95* 95*    BMET  Recent Labs  05/01/15 0630 05/02/15 0425  NA 144 148*  K 3.6 3.5  CL 117* 117*  CO2 21* 25  GLUCOSE 189* 107*  BUN 41* 46*  CREATININE 2.49* 2.59*  CALCIUM 5.7* 6.5*   PT/INR  Recent Labs  04/30/15 0250 04/30/15 1530  LABPROT 33.9* 22.3*  INR 3.43* 1.97*     Recent Labs Lab 04/26/15 1539 04/29/15 1930  AST 20 24  ALT 12 15  ALKPHOS 65 37*  BILITOT 0.4 0.3  PROT 6.7 4.6*  ALBUMIN 3.6 2.1*     Lipase     Component Value Date/Time   LIPASE 32 04/26/2015 1539     Studies/Results: Dg Chest Port 1 View  05/01/2015  CLINICAL DATA:  Respiratory failure EXAM:  PORTABLE CHEST 1 VIEW COMPARISON:  05/01/2015 FINDINGS: Stable endotracheal and NG tube position. There is left IJ central line with tip in SVC. No pneumothorax. Persistent small left pleural effusion and left basilar atelectasis or infiltrate. No pulmonary edema. IMPRESSION: Stable support apparatus. Left IJ central line in place. Persistent small left pleural effusion with left basilar atelectasis or infiltrate. Electronically Signed   By: Lahoma Crocker M.D.   On: 05/01/2015 14:28   Dg Chest Port 1 View  05/01/2015  CLINICAL DATA:  Endotracheal tube placement EXAM: PORTABLE CHEST 1 VIEW COMPARISON:  04/29/2015 FINDINGS: Grossly unchanged enlarged cardiac silhouette and mediastinal contours with atherosclerotic plaque within the thoracic aorta. Endotracheal tube overlies tracheal air column with tip approximately 1.7 cm above the carina. Enteric tube tip and side port project below the left hemidiaphragm. Worsening bibasilar heterogeneous/consolidative opacities, left greater than right. No definite pleural effusion. No pneumothorax. No definite evidence of edema. Unchanged bones. IMPRESSION: 1. Appropriately positioned support apparatus as above. No pneumothorax. 2. Worsening bibasilar heterogeneous opacities, left greater than right, atelectasis versus infiltrate. 3. No evidence of edema. Electronically Signed   By: Sandi Mariscal M.D.   On: 05/01/2015 07:22    Medications: . sodium chloride   Intravenous Once  . sodium chloride   Intravenous Once  . sodium chloride  10  mL/hr Intravenous Once  . sodium chloride  10 mL/hr Intravenous Once  . sodium chloride   Intravenous Once  . antiseptic oral rinse  7 mL Mouth Rinse QID  . chlorhexidine gluconate  15 mL Mouth Rinse BID  . fentaNYL (SUBLIMAZE) injection  50 mcg Intravenous Once  . fentaNYL (SUBLIMAZE) injection  50 mcg Intravenous Once  . insulin aspart  0-15 Units Subcutaneous 6 times per day  . ketamine  2 mg/kg Intravenous Once  . levothyroxine   12.5 mcg Intravenous Daily  . midazolam  1 mg Intravenous Once  . [START ON 05/03/2015] pantoprazole (PROTONIX) IV  40 mg Intravenous Q12H   . fentaNYL infusion INTRAVENOUS 50 mcg/hr (05/01/15 1330)  . pantoprozole (PROTONIX) infusion 8 mg/hr (05/02/15 0427)   Prior to Admission medications   Medication Sig Start Date End Date Taking? Authorizing Provider  aspirin 81 MG tablet Take 81 mg by mouth daily.   Yes Historical Provider, MD  Calcium-Magnesium 500-250 MG TABS Take 1 tablet by mouth daily as needed (takes once in while).    Yes Historical Provider, MD  Coenzyme Q10 (CO Q 10) 100 MG CAPS Take 100 mg by mouth daily as needed (takes once in while).    Yes Historical Provider, MD  hyoscyamine (LEVSIN/SL) 0.125 MG SL tablet Place 1 tablet (0.125 mg total) under the tongue every 4 (four) hours as needed. For colon spasm 04/26/15  Yes Claretta Fraise, MD  Multiple Vitamins-Minerals (MULTIVITAMIN WITH MINERALS) tablet Take 1 tablet by mouth daily.   Yes Historical Provider, MD  neomycin-polymyxin b-dexamethasone (MAXITROL) 3.5-10000-0.1 OINT Place 1 application into the right eye at bedtime.  04/09/15  Yes Historical Provider, MD  Omega 3 1200 MG CAPS Take 1,200 mg by mouth daily as needed (takes once in while).    Yes Historical Provider, MD  omeprazole (PRILOSEC) 20 MG capsule Take 1 capsule (20 mg total) by mouth daily. Take on an empty stomach 04/26/15  Yes Claretta Fraise, MD  simvastatin (ZOCOR) 40 MG tablet Take 0.5 tablets (20 mg total) by mouth daily. 09/21/14  Yes Claretta Fraise, MD  valsartan-hydrochlorothiazide (DIOVAN-HCT) 320-25 MG tablet TAKE ONE TABLET BY MOUTH ONCE DAILY 02/26/15  Yes Claretta Fraise, MD  levothyroxine (SYNTHROID, LEVOTHROID) 25 MCG tablet TAKE ONE TABLET BY MOUTH ONCE DAILY BEFORE BREAKFAST 11/16/14   Claretta Fraise, MD     Assessment/Plan Upper GI bleed from duodenal ulcer S/p GDA coil embolization 04/30/15, Dr. Vernard Gambles IR Hypovolemic shock Anemia from  bleeding Elevated Troponin Hx of hypertension AODM Chronic renal disease Hypothyroid  Antibiotics: None DVT:  SCD   Plan:  We will be available and watch with you for now.  No current need for surgery at this point.   LOS: 3 days    Willett Lefeber 05/02/2015

## 2015-05-02 NOTE — Progress Notes (Addendum)
PULMONARY / CRITICAL CARE MEDICINE   Name: Sophia [name] MRN: [id number] DOB: 11-09-1931    ADMISSION DATE:  04/29/2015 CONSULTATION DATE:  04/29/15  REFERRING MD:  EDP  CHIEF COMPLAINT:  GI bleed  HISTORY OF PRESENT ILLNESS:   Sophia [name] is an 81 y.o. F with PMH as outlined below.  She was brought to [facility] ED 12/12 due to near syncopal episode along with melena.  She did fall and suffered a laceration to her forehead.    On arrival to ED, she was apparently covered in melena and blood.  Once in ED stretcher, she began to have active hematemesis.  SBP was in the 70's and Hgb 6.5 (was 11.0 just 3 days prior).  She was given 4L IVF and had 4u PRBC ordered.  GI was called and planning for bedside endoscopy in ED.  PCCM called for admission.  SUBJECTIVE:   Awake, required transfusion  VITAL SIGNS: BP 124/67 mmHg  Pulse 93  Temp(Src) 98.3 F (36.8 C) (Oral)  Resp 19  Wt 171 lb 8.3 oz (77.8 kg)  SpO2 98%  HEMODYNAMICS: CVP:  [10 mmHg-30 mmHg] 17 mmHg  VENTILATOR SETTINGS: Vent Mode:  [-] PSV;CPAP FiO2 (%):  [40 %] 40 % PEEP:  [5 cmH20] 5 cmH20 Pressure Support:  [5 cmH20] 5 cmH20  INTAKE / OUTPUT: I/O last 3 completed shifts: In: 4120.4 [I.V.:2639.4; Blood:335; Other:86; NG/GT:60; IV Piggyback:1000] Out: 2476 [id number]; Emesis/NG output:500; Stool:1]   PHYSICAL EXAMINATION: General: Elderly female, in NAD,follows commands but is weak Neuro: A&O x 3, non-focal.  HEENT: [place]/AT. PERRL, sclerae anicteric. Cardiovascular: regular, no M/R/G.  Lungs: Respirations even and unlabored.  Coarse BS diffusely. Abdomen: BS x 4, soft, NT/ND. Continues to have dark stools, NGT with dark drainage Musculoskeletal: No gross deformities, no edema.  Skin: Pale, warm, no rashes.  LABS:  BMET  Recent Labs Lab 04/30/15 1511 05/01/15 0630 05/02/15 0425  NA 146* 144 148*  K 4.2 3.6 3.5  CL 122* 117* 117*  CO2 18* 21* 25  BUN 31* 41* 46*  CREATININE 2.07* 2.49* 2.59*   GLUCOSE 199* 189* 107*   Electrolytes  Recent Labs Lab 04/30/15 0250 04/30/15 1511 05/01/15 0630 05/02/15 0425  CALCIUM 4.9* 5.9* 5.7* 6.5*  MG 1.3*  --  1.7 1.7  PHOS 3.1  --  2.1* 2.7   CBC  Recent Labs Lab 05/01/15 2100 05/02/15 0159 05/02/15 0425  WBC 19.3* 18.2* 19.2*  HGB 7.7* 7.3* 7.5*  HCT 22.1* 21.7* 22.3*  PLT 105* 95* 95*    Coag's  Recent Labs Lab 04/29/15 1930 04/30/15 0250 04/30/15 1530  APTT 30  --  38*  INR 1.48 3.43* 1.97*   Sepsis Markers  Recent Labs Lab 04/30/15 0249 05/01/15 0030  LATICACIDVEN 10.3* 2.6*   ABG  Recent Labs Lab 04/30/15 1451 05/01/15 0420 05/02/15 0500  PHART 7.290* 7.460* 7.435  PCO2ART 31.8* 26.8* 35.8  PO2ART 122.0* 88.4 108*    Liver Enzymes  Recent Labs Lab 04/26/15 1539 04/29/15 1930  AST 20 24  ALT 12 15  ALKPHOS 65 37*  BILITOT 0.4 0.3  ALBUMIN 3.6 2.1*   Cardiac Enzymes  Recent Labs Lab 04/30/15 1511 04/30/15 1945 05/01/15 0253  TROPONINI 3.54* 4.13* 2.29*   Glucose  Recent Labs Lab 05/01/15 1549 05/01/15 1957 05/02/15 0023 05/02/15 0345 05/02/15 0806  GLUCAP 134* 139* 128* 102* 100*    Imaging Dg Chest Port 1 View  05/01/2015  CLINICAL DATA:  Respiratory failure EXAM: PORTABLE CHEST 1 VIEW  COMPARISON:  05/01/2015 FINDINGS: Stable endotracheal and NG tube position. There is left IJ central line with tip in SVC. No pneumothorax. Persistent small left pleural effusion and left basilar atelectasis or infiltrate. No pulmonary edema. IMPRESSION: Stable support apparatus. Left IJ central line in place. Persistent small left pleural effusion with left basilar atelectasis or infiltrate. Electronically Signed   By: Lahoma Crocker M.D.   On: 05/01/2015 14:28     STUDIES:  AXR 12/12 > no free air.   CULTURES: None  ANTIBIOTICS: None  SIGNIFICANT EVENTS: 12/12 > admitted with UGIB 12/14 required transfusion  LINES/TUBES: ETT 12/12 >12/14 R femoral 12/12 >12/14 12/14 lij  cvl>>  ASSESSMENT / PLAN:  GASTROINTESTINAL A:   UGIB of unclear etiology. GI prophylaxis. Nutrition. S/P IR procedure. P:   - Embolized via IR. - Surgery following, no surgical interventions for now. May need in future as she continues to ooze.  - Repeat CBC noted. - NPO. -Has NGT with dark blood, maroon stools. GI signed off 12/14. CCS monitoring  _Do we remove NGT  HEMATOLOGIC  Recent Labs  05/02/15 0159 05/02/15 0425  HGB 7.3* 7.5*   A:   Acute blood loss anemia. Hgb 6. VTE Prophylaxis. P:  H/H q6hrs. Transfuse for Hgb < 7. Transfused 12/14 for low hgb SCD's only. CBC in AM.  PULMONARY A: VDRF due to inability to protect airway. Resolved with extubation 12/14 P:   Full vent support. Wean as able. Consider extubation 12/14  CARDIOVASCULAR A:  Shock - presumed hemorrhagic due to UGIB. Hx HTN, HLD. Troponin 2.13 with EKG with no evidence of ST segment elevations, likely demand ischemia.Peaked at 4 P:  Hold outpatient ASA, simvastatin, diovan. Cards consulted . No intervention with GIB possible.  RENAL Lab Results  Component Value Date   CREATININE 2.59* 05/02/2015   CREATININE 2.49* 05/01/2015   CREATININE 2.07* 04/30/2015   CREATININE 1.28* 10/24/2012   A:   CKD. Metabolic acidosis mixed gap/non-gap. Hypocalcemia. Corrected ca nl  P:    DCbicarb drip 12/15 D/C NS patient already has hyperchloremic metabolic acidosis. BMP  Replace electrolytes as indicated.  INFECTIOUS A:   No indication of infection. P:   Monitor clinically.  ENDOCRINE A:   DM - not on outpatient meds.   Hypothyroidism. TSH in parameters P:   Assess Hgb A1c. Continue outpatient synthroid, change to IV formulation.  NEUROLOGIC A:   Acute metabolic encephalopathy due to sedation. Improved 12/14. Off sedation and follows commands. Resolved P:   Awake and follows command post extubation  Family updated: No family bedside.  Interdisciplinary Family Meeting v  Palliative Care Meeting:  Due by: 12/18.  Richardson Landry Minor ACNP Maryanna Shape PCCM Pager [phone] till 3 pm If no answer page [phone]  Attending Note:  81 year old female with GI Bleeding, was intubated for AMS, extubated and doing well.  I reviewed CXR myself, mild pulmonary edema.  Discussed with PCCM-NP and bedside RN.  Crackles on exam.  Pulmonary edema:  - Diureses.  - KVO IVF.  Hypoxemia:  - Titrate o2 for sats.  - Will need ambulatory desaturation study prior to discharge.  GI bleed:  - Remove NGT.  - Start diet.  - Protonix drip.  - Monitor closely.  Metabolic acidosis:  - D/C NS.  - D/C bicarb drip.  - BMET in AM.  Transfer to SDU and to [facility] service with PCCM off   Rush Farmer, M.D. [facility] Pulmonary/Critical Care Medicine. Pager: [phone]. After hours pager: [phone].

## 2015-05-03 ENCOUNTER — Inpatient Hospital Stay (HOSPITAL_COMMUNITY): Payer: Medicare Other

## 2015-05-03 DIAGNOSIS — D72829 Elevated white blood cell count, unspecified: Secondary | ICD-10-CM | POA: Diagnosis present

## 2015-05-03 DIAGNOSIS — N189 Chronic kidney disease, unspecified: Secondary | ICD-10-CM | POA: Diagnosis present

## 2015-05-03 DIAGNOSIS — N179 Acute kidney failure, unspecified: Secondary | ICD-10-CM | POA: Diagnosis present

## 2015-05-03 DIAGNOSIS — D62 Acute posthemorrhagic anemia: Secondary | ICD-10-CM

## 2015-05-03 DIAGNOSIS — E039 Hypothyroidism, unspecified: Secondary | ICD-10-CM

## 2015-05-03 LAB — TYPE AND SCREEN
ABO/RH(D): A POS
Antibody Screen: NEGATIVE
Unit division: 0
Unit division: 0
Unit division: 0
Unit division: 0
Unit division: 0
Unit division: 0
Unit division: 0
Unit division: 0
Unit division: 0
Unit division: 0
Unit division: 0
Unit division: 0
Unit division: 0

## 2015-05-03 LAB — CBC WITH DIFFERENTIAL/PLATELET
Basophils Absolute: 0 10*3/uL (ref 0.0–0.1)
Basophils Relative: 0 %
Eosinophils Absolute: 0.1 10*3/uL (ref 0.0–0.7)
Eosinophils Relative: 1 %
HCT: 19.5 % — ABNORMAL LOW (ref 36.0–46.0)
Hemoglobin: 6.7 g/dL — CL (ref 12.0–15.0)
Lymphocytes Relative: 9 %
Lymphs Abs: 1.6 10*3/uL (ref 0.7–4.0)
MCH: 29.1 pg (ref 26.0–34.0)
MCHC: 34.4 g/dL (ref 30.0–36.0)
MCV: 84.8 fL (ref 78.0–100.0)
Monocytes Absolute: 1 10*3/uL (ref 0.1–1.0)
Monocytes Relative: 6 %
Neutro Abs: 15.5 10*3/uL — ABNORMAL HIGH (ref 1.7–7.7)
Neutrophils Relative %: 85 %
Platelets: 110 10*3/uL — ABNORMAL LOW (ref 150–400)
RBC: 2.3 MIL/uL — ABNORMAL LOW (ref 3.87–5.11)
RDW: 17.7 % — ABNORMAL HIGH (ref 11.5–15.5)
WBC: 18.3 10*3/uL — ABNORMAL HIGH (ref 4.0–10.5)

## 2015-05-03 LAB — PROTIME-INR
INR: 1.58 — ABNORMAL HIGH (ref 0.00–1.49)
Prothrombin Time: 18.9 seconds — ABNORMAL HIGH (ref 11.6–15.2)

## 2015-05-03 LAB — GLUCOSE, CAPILLARY
Glucose-Capillary: 131 mg/dL — ABNORMAL HIGH (ref 65–99)
Glucose-Capillary: 102 mg/dL — ABNORMAL HIGH (ref 65–99)
Glucose-Capillary: 112 mg/dL — ABNORMAL HIGH (ref 65–99)

## 2015-05-03 LAB — BASIC METABOLIC PANEL
Anion gap: 6 (ref 5–15)
BUN: 51 mg/dL — ABNORMAL HIGH (ref 6–20)
CO2: 23 mmol/L (ref 22–32)
Calcium: 6.7 mg/dL — ABNORMAL LOW (ref 8.9–10.3)
Chloride: 118 mmol/L — ABNORMAL HIGH (ref 101–111)
Creatinine, Ser: 2.41 mg/dL — ABNORMAL HIGH (ref 0.44–1.00)
GFR calc Af Amer: 21 mL/min — ABNORMAL LOW (ref >60)
GFR calc non Af Amer: 18 mL/min — ABNORMAL LOW (ref >60)
Glucose, Bld: 110 mg/dL — ABNORMAL HIGH (ref 65–99)
Potassium: 3.4 mmol/L — ABNORMAL LOW (ref 3.5–5.1)
Sodium: 147 mmol/L — ABNORMAL HIGH (ref 135–145)

## 2015-05-03 LAB — MAGNESIUM: Magnesium: 1.8 mg/dL (ref 1.7–2.4)

## 2015-05-03 LAB — HEMOGLOBIN AND HEMATOCRIT, BLOOD
HCT: 28.6 % — ABNORMAL LOW (ref 36.0–46.0)
Hemoglobin: 9.5 g/dL — ABNORMAL LOW (ref 12.0–15.0)

## 2015-05-03 LAB — APTT: aPTT: 30 seconds (ref 24–37)

## 2015-05-03 LAB — PHOSPHORUS: Phosphorus: 2.5 mg/dL (ref 2.5–4.6)

## 2015-05-03 LAB — TSH: TSH: 6.292 u[IU]/mL — ABNORMAL HIGH (ref 0.350–4.500)

## 2015-05-03 LAB — PREPARE RBC (CROSSMATCH)

## 2015-05-03 MED ORDER — SODIUM CHLORIDE 0.9 % IV SOLN
Freq: Once | INTRAVENOUS | Status: AC
  Administered 2015-05-03: 11:00:00 via INTRAVENOUS

## 2015-05-03 MED ORDER — MAGNESIUM SULFATE 2 GM/50ML IV SOLN
2.0000 g | Freq: Once | INTRAVENOUS | Status: AC
  Administered 2015-05-03: 2 g via INTRAVENOUS
  Filled 2015-05-03: qty 50

## 2015-05-03 MED ORDER — INSULIN ASPART 100 UNIT/ML ~~LOC~~ SOLN
0.0000 [IU] | Freq: Three times a day (TID) | SUBCUTANEOUS | Status: DC
  Administered 2015-05-03 – 2015-05-06 (×6): 1 [IU] via SUBCUTANEOUS
  Administered 2015-05-07: 2 [IU] via SUBCUTANEOUS
  Administered 2015-05-07 – 2015-05-08 (×2): 1 [IU] via SUBCUTANEOUS

## 2015-05-03 MED ORDER — CETYLPYRIDINIUM CHLORIDE 0.05 % MT LIQD
7.0000 mL | Freq: Two times a day (BID) | OROMUCOSAL | Status: DC
  Administered 2015-05-03 – 2015-05-07 (×9): 7 mL via OROMUCOSAL

## 2015-05-03 MED ORDER — SODIUM CHLORIDE 0.9 % IV SOLN: Freq: Once | INTRAVENOUS | Status: DC

## 2015-05-03 NOTE — Progress Notes (Signed)
eLink Physician-Brief Progress Note Patient Name: Sophia [name] DOB: 1931-06-23 MRN: [id number]   Date of Service  05/03/2015  HPI/Events of Note  Hgb drop from 7.4 to 6.7  eICU Interventions  Plan: Transfuse 1 unit pRBC Post-transfusion CBC     Intervention Category Intermediate Interventions: Bleeding - evaluation and treatment with blood products  DETERDING,ELIZABETH 05/03/2015, 4:28 AM

## 2015-05-03 NOTE — Progress Notes (Signed)
4 Days Post-Op  Subjective: No complaints Tolerating NG out Remains hemodynamically stable  Objective: Vital signs in last 24 hours: Temp:  [97.8 F (36.6 C)-99.8 F (37.7 C)] 97.8 F (36.6 C) (12/16 0700) Pulse Rate:  [68-105] 87 (12/16 0700) Resp:  [13-22] 17 (12/16 0700) BP: (93-154)/(48-73) 93/52 mmHg (12/16 0700) SpO2:  [92 %-100 %] 92 % (12/16 0700) Weight:  [77.3 kg (170 lb 6.7 oz)-77.8 kg (171 lb 8.3 oz)] 77.3 kg (170 lb 6.7 oz) (12/16 0500) Last BM Date: 07/02/14  Intake/Output from previous day: 12/15 0701 - 12/16 0700 In: 627.9 [P.O.:180; I.V.:417.9; NG/GT:30] Out: 1785 [Urine:1584; Emesis/NG output:200; Stool:1] Intake/Output this shift:    Abdomen soft, non tender  Lab Results:   Recent Labs  05/02/15 0425 05/02/15 0943 05/03/15 0258  WBC 19.2*  --  18.3*  HGB 7.5* 7.4* 6.7*  HCT 22.3* 22.0* 19.5*  PLT 95*  --  110*   BMET  Recent Labs  05/02/15 0425 05/03/15 0258  NA 148* 147*  K 3.5 3.4*  CL 117* 118*  CO2 25 23  GLUCOSE 107* 110*  BUN 46* 51*  CREATININE 2.59* 2.41*  CALCIUM 6.5* 6.7*   PT/INR  Recent Labs  04/30/15 1530 05/03/15 0258  LABPROT 22.3* 18.9*  INR 1.97* 1.58*   ABG  Recent Labs  05/01/15 0420 05/02/15 0500  PHART 7.460* 7.435  HCO3 18.8* 23.7    Studies/Results: Dg Chest Port 1 View  05/01/2015  CLINICAL DATA:  Respiratory failure EXAM: PORTABLE CHEST 1 VIEW COMPARISON:  05/01/2015 FINDINGS: Stable endotracheal and NG tube position. There is left IJ central line with tip in SVC. No pneumothorax. Persistent small left pleural effusion and left basilar atelectasis or infiltrate. No pulmonary edema. IMPRESSION: Stable support apparatus. Left IJ central line in place. Persistent small left pleural effusion with left basilar atelectasis or infiltrate. Electronically Signed   By: Lahoma Crocker M.D.   On: 05/01/2015 14:28    Anti-infectives: Anti-infectives    None      Assessment/Plan: s/p  Procedure(s): ESOPHAGOGASTRODUODENOSCOPY (EGD) (N/A)  Upper GI bleed from duodenal ulcer s/p IR embolization  Will with drifting hemoglobin which is part multifactorial.  Will continue to follow.  Only plan surgery if she becomes unstable.  I don't think she would do well with surgery and she agrees unless it is emergent.  LOS: 4 days    Toby Breithaupt A 05/03/2015

## 2015-05-03 NOTE — Progress Notes (Signed)
[facility] PROGRESS NOTE  Sophia [name] [id number] DOB: 1932-05-03 DOA: 04/29/2015 PCP: Claretta Fraise, MD  Assessment/Plan: #1 upper GI bleed secondary to duodenal ulcer and gastroduodenal artery status post upper endoscopy 04/29/2015 showing a duodenal ulcer patient with continuous bleeding and status post IR embolization of gastroduodenal artery. Dr. Jarvis Newcomer 04/30/2015. Patient is status post 9 units packed red blood cells since admission, 3 units FFP, 1 unit for recent platelets. Hemoglobin still trending down and currently at 6.7 today. Blood pressure is borderline. Patient alert and following commands. Patient denies any abdominal pain. Transfuse 2 units packed red blood cells. Continue IV PPI. Follow H&H. Transfusion threshold hemoglobin less than 7. Tolerating clears. Per general surgery if patient was to become hemodynamically unstable with continued bleeding may require surgery however will treat conservatively for now and follow. General surgery following. GI signed off.  #2 acute blood loss anemia Secondary to problem #1. Status post 9 units packed red blood cells. Hemoglobin currently at 6.7. Transfuse 2 units packed red blood cells. Follow.  #3 hemorrhagic shock Secondary to problem #1. Blood pressure improved. Off pressors. Monitor closely. Follow.  #4 status post vent dependent respiratory failure On admission patient was intubated and placed on the vent for airway protection. Patient is extubated. Patient protecting airway. Follow.  #5 acute on chronic kidney disease stage III Likely secondary to prerenal azotemia secondary to hemorrhagic shock. Patient receiving blood products. Check a UA with cultures and sensitivities. Check a urine sodium. Check a urine creatinine. Follow. May need gentle hydration.  #6 hypokalemia Replete.  7 leukocytosis Likely a stress the margination. Chest x-ray is negative for any acute infiltrate. Patient is afebrile. Check a UA  with cultures and sensitivities. Follow for now.  #8 elevated troponin/probable demand ischemia Troponins were elevated likely secondary to admission hemoglobin of 4. Patient denies any active chest pain. EKG with normal sinus rhythm with no signs of ischemia. 2-D echo with EF of 60-65% with no wall motion abnormalities with grade 1 diastolic dysfunction. Monitor closely for volume overload. Patient is being followed by cardiology and treatment limited by inability to give anticoagulation due to current bleed.  #9 hypothyroidism Check a TSH. Continue IV Synthroid.  #10 diabetes mellitus Not on any outpatient medications. Check a hemoglobin A1c. Follow CBGs.  0000000 acute metabolic encephalopathy secondary to sedation Resolved.  #12 prophylaxis PPI for GI prophylaxis.  Code Status: Full Family Communication: Updated patient. No family present. Disposition Plan: Remain in the step down unit.   Consultants:  PCCM admit : Dr. Lamonte Sakai 04/29/2015    cardiology Dr. Johnsie Cancel 04/30/2015  Gen. surgery Dr. Barry Dienes 04/30/2015  Interventional radiology Dr. Jarvis Newcomer 04/30/2015  Gastroenterology Dr. Watt Climes 04/29/2015   Procedures:  Upper endoscopy 04/29/2015 Dr. Watt Climes  Chest x-ray 05/03/2015, 05/01/2015, 04/29/2015  Abdominal films 04/29/2015  IR embolization of gastroduodenal artery Dr Vernard Gambles 04/30/2015  2-D echo 05/01/2015  ETT 12/12 >>12/14  [facility] CVL 12/14  R femoral 12/12 >12/14  9 units packed red blood cells since admission  3 units FFP  1 unit pheresed platelets  Antibiotics:  None  HPI/Subjective: Per nursing patient with bloody bowel movement overnight which was a clot. No bowel movement today. Patient denies any hematemesis. Patient states she's feeling better. No chest pain. No shortness of breath. Tolerating clear liquids.  Objective: Filed Vitals:   05/03/15 0700 05/03/15 0800  BP: 93/52   Pulse: 87   Temp: 97.8 F (36.6 C) 98.4 F (36.9 C)  Resp: 17      Intake/Output  Summary (Last 24 hours) at 05/03/15 1003 Last data filed at 05/03/15 0643  Gross per 24 hour  Intake 522.92 ml  Output   1334 ml  Net -811.08 ml   Filed Weights   05/02/15 0500 05/02/15 0807 05/03/15 0500  Weight: 77.8 kg (171 lb 8.3 oz) 77.8 kg (171 lb 8.3 oz) 77.3 kg (170 lb 6.7 oz)    Exam:   General:  NAD  Cardiovascular: RRR  Respiratory: CTAB anterior lung fields.  Abdomen: Soft/ND/+BS/min-mild discomfort.  Musculoskeletal: No c/c/e  Data Reviewed: Basic Metabolic Panel:  Recent Labs Lab 04/30/15 0250 04/30/15 1511 05/01/15 0630 05/02/15 0425 05/03/15 0258  NA 143 146* 144 148* 147*  K 4.2 4.2 3.6 3.5 3.4*  CL 126* 122* 117* 117* 118*  CO2 7* 18* 21* 25 23  GLUCOSE 422* 199* 189* 107* 110*  BUN 29* 31* 41* 46* 51*  CREATININE 1.37* 2.07* 2.49* 2.59* 2.41*  CALCIUM 4.9* 5.9* 5.7* 6.5* 6.7*  MG 1.3*  --  1.7 1.7 1.8  PHOS 3.1  --  2.1* 2.7 2.5   Liver Function Tests:  Recent Labs Lab 04/26/15 1539 04/29/15 1930  AST 20 24  ALT 12 15  ALKPHOS 65 37*  BILITOT 0.4 0.3  PROT 6.7 4.6*  ALBUMIN 3.6 2.1*    Recent Labs Lab 04/26/15 1539  LIPASE 32  AMYLASE 49   No results for input(s): AMMONIA in the last 168 hours. CBC:  Recent Labs Lab 04/26/15 1539  05/01/15 0630  05/01/15 1840 05/01/15 2100 05/02/15 0159 05/02/15 0425 05/02/15 0943 05/03/15 0258  WBC 10.2  < > 17.2*  < > 18.5* 19.3* 18.2* 19.2*  --  18.3*  NEUTROABS 8.4*  --  14.8*  --   --   --   --  16.6*  --  15.5*  HGB  --   < > 8.1*  < > 7.5* 7.7* 7.3* 7.5* 7.4* 6.7*  HCT 32.9*  < > 23.0*  < > 21.7* 22.1* 21.7* 22.3* 22.0* 19.5*  MCV  --   < > 82.4  < > 83.1 83.1 84.4 84.5  --  84.8  PLT  --   < > 99*  < > 95* 105* 95* 95*  --  110*  < > = values in this interval not displayed. Cardiac Enzymes:  Recent Labs Lab 04/30/15 1123 04/30/15 1511 04/30/15 1945 05/01/15 0253  TROPONINI 2.13* 3.54* 4.13* 2.29*   BNP (last 3 results) No results for  input(s): BNP in the last 8760 hours.  ProBNP (last 3 results) No results for input(s): PROBNP in the last 8760 hours.  CBG:  Recent Labs Lab 05/01/15 1957 05/02/15 0023 05/02/15 0345 05/02/15 0806 05/02/15 1139  GLUCAP 139* 128* 102* 100* 121*    Recent Results (from the past 240 hour(s))  MRSA PCR Screening     Status: None   Collection Time: 04/30/15  2:24 AM  Result Value Ref Range Status   MRSA by PCR NEGATIVE NEGATIVE Final    Comment:        The GeneXpert MRSA Assay (FDA approved for NASAL specimens only), is one component of a comprehensive MRSA colonization surveillance program. It is not intended to diagnose MRSA infection nor to guide or monitor treatment for MRSA infections.      Studies: Dg Chest Port 1 View  05/03/2015  CLINICAL DATA:  Respiratory failure EXAM: PORTABLE CHEST 1 VIEW COMPARISON:  Portable chest x-ray of May 01, 2015 FINDINGS: The lungs are adequately inflated.  There is persistent basilar atelectasis or pneumonia and small pleural effusion. The heart is top-normal in size. The pulmonary vascularity is mildly prominent centrally. There is a left internal jugular venous catheter in place whose tip projects over the midportion of the SVC. The trachea and esophagus have been extubated. IMPRESSION: Stable appearance of the chest postextubation. Persistent left lower lobe atelectasis -pneumonia with small left pleural effusion. Electronically Signed   By: David  Martinique M.D.   On: 05/03/2015 07:36   Dg Chest Port 1 View  05/01/2015  CLINICAL DATA:  Respiratory failure EXAM: PORTABLE CHEST 1 VIEW COMPARISON:  05/01/2015 FINDINGS: Stable endotracheal and NG tube position. There is left IJ central line with tip in SVC. No pneumothorax. Persistent small left pleural effusion and left basilar atelectasis or infiltrate. No pulmonary edema. IMPRESSION: Stable support apparatus. Left IJ central line in place. Persistent small left pleural effusion with  left basilar atelectasis or infiltrate. Electronically Signed   By: Lahoma Crocker M.D.   On: 05/01/2015 14:28    Scheduled Meds: . sodium chloride   Intravenous Once  . sodium chloride   Intravenous Once  . antiseptic oral rinse  7 mL Mouth Rinse BID  . levothyroxine  12.5 mcg Intravenous Daily  . magnesium sulfate 1 - 4 g bolus IVPB  2 g Intravenous Once  . pantoprazole (PROTONIX) IV  40 mg Intravenous Q12H   Continuous Infusions: . fentaNYL infusion INTRAVENOUS 50 mcg/hr (05/01/15 1330)    Principal Problem:   Gastrointestinal hemorrhage associated with duodenal ulcer Active Problems:   Hemorrhagic shock   HTN (hypertension)   Diabetes mellitus without complication (HCC)   Chronic kidney disease   Upper GI bleed   Acute respiratory failure (HCC)   Metabolic acidosis   Acute respiratory failure with hypoxia (HCC)   Elevated troponin   Acute kidney injury superimposed on CKD ([place])   Leukocytosis   Acute blood loss anemia    Time spent: 45 mins     [facility] MD [facility] Pager [phone]. If 7PM-7AM, please contact night-coverage at [email or web address], password [facility] 05/03/2015, 10:03 AM  LOS: 4 days

## 2015-05-03 NOTE — Progress Notes (Signed)
Patient ID: Sophia [name], female   DOB: 10/04/1931, 81 y.o.   MRN: [id number]    Subjective:  Alert had ice chips and jello yesterday   Objective:  Filed Vitals:   05/03/15 0400 05/03/15 0500 05/03/15 0700 05/03/15 0800  BP: 98/48  93/52   Pulse: 68  87   Temp: 98.5 F (36.9 C)  97.8 F (36.6 C) 98.4 F (36.9 C)  TempSrc: Oral  Oral Oral  Resp: 17  17   Height:      Weight:  77.3 kg (170 lb 6.7 oz)    SpO2: 92%  92%     Intake/Output from previous day:  Intake/Output Summary (Last 24 hours) at 05/03/15 [id number] Last data filed at 05/03/15 [id number]  Gross per 24 hour  Intake 547.92 ml  Output   1384 ml  Net -836.08 ml    Physical Exam: Pale overweight white female  HEENT: normal laceration on forehead  Neck supple with no adenopathy JVP normal no bruits no thyromegaly Lungs rhonchi Heart:  S1/S2 no murmur, no rub, gallop or click PMI normal Abdomen: benighn, BS positve, no tenderness, no AAA NG tube to suction  no bruit.  No HSM or HJR Distal pulses intact with no bruits No edema Neuro non-focal RFV catheter    Lab Results: Basic Metabolic Panel:  Recent Labs  05/02/15 0425 05/03/15 0258  NA 148* 147*  K 3.5 3.4*  CL 117* 118*  CO2 25 23  GLUCOSE 107* 110*  BUN 46* 51*  CREATININE 2.59* 2.41*  CALCIUM 6.5* 6.7*  MG 1.7 1.8  PHOS 2.7 2.5   CBC:  Recent Labs  05/02/15 0425 05/02/15 0943 05/03/15 0258  WBC 19.2*  --  18.3*  NEUTROABS 16.6*  --  15.5*  HGB 7.5* 7.4* 6.7*  HCT 22.3* 22.0* 19.5*  MCV 84.5  --  84.8  PLT 95*  --  110*   Cardiac Enzymes:  Recent Labs  04/30/15 1511 04/30/15 1945 05/01/15 0253  TROPONINI 3.54* 4.13* 2.29*    Imaging: Dg Chest Port 1 View  05/03/2015  CLINICAL DATA:  Respiratory failure EXAM: PORTABLE CHEST 1 VIEW COMPARISON:  Portable chest x-ray of May 01, 2015 FINDINGS: The lungs are adequately inflated. There is persistent basilar atelectasis or pneumonia and small pleural effusion. The heart is  top-normal in size. The pulmonary vascularity is mildly prominent centrally. There is a left internal jugular venous catheter in place whose tip projects over the midportion of the SVC. The trachea and esophagus have been extubated. IMPRESSION: Stable appearance of the chest postextubation. Persistent left lower lobe atelectasis -pneumonia with small left pleural effusion. Electronically Signed   By: David  Martinique M.D.   On: 05/03/2015 07:36   Dg Chest Port 1 View  05/01/2015  CLINICAL DATA:  Respiratory failure EXAM: PORTABLE CHEST 1 VIEW COMPARISON:  05/01/2015 FINDINGS: Stable endotracheal and NG tube position. There is left IJ central line with tip in SVC. No pneumothorax. Persistent small left pleural effusion and left basilar atelectasis or infiltrate. No pulmonary edema. IMPRESSION: Stable support apparatus. Left IJ central line in place. Persistent small left pleural effusion with left basilar atelectasis or infiltrate. Electronically Signed   By: Lahoma Crocker M.D.   On: 05/01/2015 14:28    Cardiac Studies:  ECG:  SR nonspecific ST changes   Telemetry:  ST no PAF or VT  Echo:  EF 60-65% normal   Medications:   . sodium chloride   Intravenous Once  . sodium chloride  Intravenous Once  . sodium chloride  10 mL/hr Intravenous Once  . sodium chloride  10 mL/hr Intravenous Once  . sodium chloride   Intravenous Once  . sodium chloride   Intravenous Once  . antiseptic oral rinse  7 mL Mouth Rinse BID  . fentaNYL (SUBLIMAZE) injection  50 mcg Intravenous Once  . fentaNYL (SUBLIMAZE) injection  50 mcg Intravenous Once  . ketamine  2 mg/kg Intravenous Once  . levothyroxine  12.5 mcg Intravenous Daily  . magnesium sulfate 1 - 4 g bolus IVPB  2 g Intravenous Once  . midazolam  1 mg Intravenous Once  . pantoprazole (PROTONIX) IV  40 mg Intravenous Q12H     . fentaNYL infusion INTRAVENOUS 50 mcg/hr (05/01/15 1330)    Assessment/Plan:   Elevated troponin:  Peaked around 4  Currently not a  candidate for any invasive evaluation or antiplatelet Rx  Echo with normal LV function And no RWMAls   ECG nonspecific ST/T wave changes Possible demand ischemia from massive GI bleed and Hb 4.8  Will likely have Outpatient myovue  Lab Results  Component Value Date   TROPONINI 2.29* 05/01/2015   Anemia:  Hb still low   S/P GDA embolization continue iv protonix plan per GI/ primary service Getting 2 more units this am with normal  EF and low BP don't think lasix needed  Discussed with Dr Mee Hives Oswaldo Conroy 05/03/2015, 9:07 AM

## 2015-05-04 ENCOUNTER — Inpatient Hospital Stay (HOSPITAL_COMMUNITY): Payer: Medicare Other

## 2015-05-04 DIAGNOSIS — N179 Acute kidney failure, unspecified: Secondary | ICD-10-CM

## 2015-05-04 DIAGNOSIS — E119 Type 2 diabetes mellitus without complications: Secondary | ICD-10-CM

## 2015-05-04 DIAGNOSIS — R0902 Hypoxemia: Secondary | ICD-10-CM

## 2015-05-04 DIAGNOSIS — I10 Essential (primary) hypertension: Secondary | ICD-10-CM

## 2015-05-04 DIAGNOSIS — N189 Chronic kidney disease, unspecified: Secondary | ICD-10-CM

## 2015-05-04 DIAGNOSIS — E870 Hyperosmolality and hypernatremia: Secondary | ICD-10-CM | POA: Clinically undetermined

## 2015-05-04 LAB — TYPE AND SCREEN
ABO/RH(D): A POS
Antibody Screen: NEGATIVE
Unit division: 0
Unit division: 0

## 2015-05-04 LAB — CBC WITH DIFFERENTIAL/PLATELET
Basophils Absolute: 0 10*3/uL (ref 0.0–0.1)
Basophils Relative: 0 %
Eosinophils Absolute: 0.4 10*3/uL (ref 0.0–0.7)
Eosinophils Relative: 2 %
HCT: 27.8 % — ABNORMAL LOW (ref 36.0–46.0)
Hemoglobin: 9.4 g/dL — ABNORMAL LOW (ref 12.0–15.0)
Lymphocytes Relative: 11 %
Lymphs Abs: 1.7 10*3/uL (ref 0.7–4.0)
MCH: 28.5 pg (ref 26.0–34.0)
MCHC: 33.8 g/dL (ref 30.0–36.0)
MCV: 84.2 fL (ref 78.0–100.0)
Monocytes Absolute: 1.2 10*3/uL — ABNORMAL HIGH (ref 0.1–1.0)
Monocytes Relative: 8 %
Neutro Abs: 11.4 10*3/uL — ABNORMAL HIGH (ref 1.7–7.7)
Neutrophils Relative %: 78 %
Platelets: 98 10*3/uL — ABNORMAL LOW (ref 150–400)
RBC: 3.3 MIL/uL — ABNORMAL LOW (ref 3.87–5.11)
RDW: 18.6 % — ABNORMAL HIGH (ref 11.5–15.5)
WBC: 14.6 10*3/uL — ABNORMAL HIGH (ref 4.0–10.5)

## 2015-05-04 LAB — BASIC METABOLIC PANEL
Anion gap: 6 (ref 5–15)
BUN: 47 mg/dL — ABNORMAL HIGH (ref 6–20)
CO2: 25 mmol/L (ref 22–32)
Calcium: 7.3 mg/dL — ABNORMAL LOW (ref 8.9–10.3)
Chloride: 116 mmol/L — ABNORMAL HIGH (ref 101–111)
Creatinine, Ser: 2.14 mg/dL — ABNORMAL HIGH (ref 0.44–1.00)
GFR calc Af Amer: 24 mL/min — ABNORMAL LOW (ref >60)
GFR calc non Af Amer: 20 mL/min — ABNORMAL LOW (ref >60)
Glucose, Bld: 107 mg/dL — ABNORMAL HIGH (ref 65–99)
Potassium: 3.4 mmol/L — ABNORMAL LOW (ref 3.5–5.1)
Sodium: 147 mmol/L — ABNORMAL HIGH (ref 135–145)

## 2015-05-04 LAB — URINALYSIS, ROUTINE W REFLEX MICROSCOPIC
Bilirubin Urine: NEGATIVE
Glucose, UA: NEGATIVE mg/dL
Hgb urine dipstick: NEGATIVE
Ketones, ur: NEGATIVE mg/dL
Leukocytes, UA: NEGATIVE
Nitrite: NEGATIVE
Protein, ur: NEGATIVE mg/dL
Specific Gravity, Urine: 1.01 (ref 1.005–1.030)
pH: 6 (ref 5.0–8.0)

## 2015-05-04 LAB — MAGNESIUM: Magnesium: 2.2 mg/dL (ref 1.7–2.4)

## 2015-05-04 LAB — HEMOGLOBIN A1C
Hgb A1c MFr Bld: 6 % — ABNORMAL HIGH (ref 4.8–5.6)
Mean Plasma Glucose: 126 mg/dL

## 2015-05-04 LAB — SODIUM, URINE, RANDOM: Sodium, Ur: 131 mmol/L

## 2015-05-04 LAB — CREATININE, URINE, RANDOM: Creatinine, Urine: 19.23 mg/dL

## 2015-05-04 LAB — HEMOGLOBIN AND HEMATOCRIT, BLOOD
HCT: 31.7 % — ABNORMAL LOW (ref 36.0–46.0)
Hemoglobin: 10.6 g/dL — ABNORMAL LOW (ref 12.0–15.0)

## 2015-05-04 LAB — GLUCOSE, CAPILLARY
Glucose-Capillary: 105 mg/dL — ABNORMAL HIGH (ref 65–99)
Glucose-Capillary: 125 mg/dL — ABNORMAL HIGH (ref 65–99)
Glucose-Capillary: 176 mg/dL — ABNORMAL HIGH (ref 65–99)

## 2015-05-04 MED ORDER — FUROSEMIDE 10 MG/ML IJ SOLN
20.0000 mg | Freq: Once | INTRAMUSCULAR | Status: AC
  Administered 2015-05-04: 20 mg via INTRAVENOUS
  Filled 2015-05-04: qty 2

## 2015-05-04 MED ORDER — POTASSIUM CHLORIDE CRYS ER 20 MEQ PO TBCR
20.0000 meq | EXTENDED_RELEASE_TABLET | Freq: Once | ORAL | Status: AC
  Administered 2015-05-04: 20 meq via ORAL
  Filled 2015-05-04: qty 1

## 2015-05-04 MED ORDER — LEVOTHYROXINE SODIUM 100 MCG IV SOLR
18.7500 ug | Freq: Every day | INTRAVENOUS | Status: DC
  Administered 2015-05-04 – 2015-05-05 (×2): 18.75 ug via INTRAVENOUS
  Filled 2015-05-04 (×2): qty 5

## 2015-05-04 MED ORDER — DEXTROSE 5 % IV SOLN
INTRAVENOUS | Status: DC
  Administered 2015-05-04: 1000 mL via INTRAVENOUS

## 2015-05-04 NOTE — Progress Notes (Signed)
5 Days Post-Op  Subjective: Received more blood yesterday Remains hemodynamically stable  Objective: Vital signs in last 24 hours: Temp:  [97.8 F (36.6 C)-99 F (37.2 C)] 97.8 F (36.6 C) (12/17 0721) Pulse Rate:  [56-81] 56 (12/17 0721) Resp:  [14-23] 14 (12/17 0721) BP: (96-123)/(50-71) 115/62 mmHg (12/17 0721) SpO2:  [94 %-99 %] 97 % (12/17 0721) Weight:  [74.7 kg (164 lb 10.9 oz)] 74.7 kg (164 lb 10.9 oz) (12/17 0447) Last BM Date: 05/03/15  Intake/Output from previous day: 12/16 0701 - 12/17 0700 In: 455 [P.O.:120; Blood:335] Out: 850 [Urine:850] Intake/Output this shift:   Abdomen soft, non tender  Lab Results:   Recent Labs  05/03/15 0258 05/03/15 1425 05/04/15 0454  WBC 18.3*  --  14.6*  HGB 6.7* 9.5* 9.4*  HCT 19.5* 28.6* 27.8*  PLT 110*  --  98*   BMET  Recent Labs  05/03/15 0258 05/04/15 0454  NA 147* 147*  K 3.4* 3.4*  CL 118* 116*  CO2 23 25  GLUCOSE 110* 107*  BUN 51* 47*  CREATININE 2.41* 2.14*  CALCIUM 6.7* 7.3*   PT/INR  Recent Labs  05/03/15 0258  LABPROT 18.9*  INR 1.58*   ABG  Recent Labs  05/02/15 0500  PHART 7.435  HCO3 23.7    Studies/Results: Dg Chest Port 1 View  05/03/2015  CLINICAL DATA:  Respiratory failure EXAM: PORTABLE CHEST 1 VIEW COMPARISON:  Portable chest x-ray of May 01, 2015 FINDINGS: The lungs are adequately inflated. There is persistent basilar atelectasis or pneumonia and small pleural effusion. The heart is top-normal in size. The pulmonary vascularity is mildly prominent centrally. There is a left internal jugular venous catheter in place whose tip projects over the midportion of the SVC. The trachea and esophagus have been extubated. IMPRESSION: Stable appearance of the chest postextubation. Persistent left lower lobe atelectasis -pneumonia with small left pleural effusion. Electronically Signed   By: David  Martinique M.D.   On: 05/03/2015 07:36    Anti-infectives: Anti-infectives    None       Assessment/Plan: s/p Procedure(s): ESOPHAGOGASTRODUODENOSCOPY (EGD) (N/A)   GI bleed from duodenal ulcer s/p embolization  Hgb/HCT stable this morning.  Advancing diet.  No plans for surgery unless she becomes unstable  LOS: 5 days    Clydie Dillen A 05/04/2015

## 2015-05-04 NOTE — Progress Notes (Signed)
Consulting cardiologist: Meda Coffee  Primary Cardiologist: [facility]  Cardiology Specific Problem List: 1. Demand ischemia 2. Hx of Hypertension   Subjective:    No complaints of chest pain, dyspnea, abdominal pain. Still having tarry stools. Cold   Objective:   Temp:  [97.3 F (36.3 C)-99 F (37.2 C)] 97.3 F (36.3 C) (12/17 1200) Pulse Rate:  [56-81] 65 (12/17 1200) Resp:  [14-20] 18 (12/17 1200) BP: (100-119)/(52-71) 119/57 mmHg (12/17 1200) SpO2:  [94 %-100 %] 100 % (12/17 1200) Weight:  [74.7 kg (164 lb 10.9 oz)] 74.7 kg (164 lb 10.9 oz) (12/17 0447) Last BM Date: 05/04/15  Filed Weights   05/02/15 0807 05/03/15 0500 05/04/15 0447  Weight: 77.8 kg (171 lb 8.3 oz) 77.3 kg (170 lb 6.7 oz) 74.7 kg (164 lb 10.9 oz)    Intake/Output Summary (Last 24 hours) at 05/04/15 1258 Last data filed at 05/04/15 1200  Gross per 24 hour  Intake    650 ml  Output   1100 ml  Net   -450 ml    Telemetry: Sinus bradycardia. One episode of Afib RVR rate of 170 bpm around 10:30 am.  Exam:  General: No acute distress.  HEENT: Conjunctiva and lids normal, oropharynx clear.  Lungs: Clear to auscultation, nonlabored.  Cardiac: No elevated JVP or bruits. RRR bradycardic, no gallop or rub.   Abdomen: Normoactive bowel sounds, nontender, nondistended.  Extremities: No pitting edema, distal pulses full.  Neuropsychiatric: Alert and oriented x3, affect appropriate.   Lab Results:  Basic Metabolic Panel:  Recent Labs Lab 05/02/15 0425 05/03/15 0258 05/04/15 0454  NA 148* 147* 147*  K 3.5 3.4* 3.4*  CL 117* 118* 116*  CO2 25 23 25   GLUCOSE 107* 110* 107*  BUN 46* 51* 47*  CREATININE 2.59* 2.41* 2.14*  CALCIUM 6.5* 6.7* 7.3*  MG 1.7 1.8 2.2    Liver Function Tests:  Recent Labs Lab 04/29/15 1930  AST 24  ALT 15  ALKPHOS 37*  BILITOT 0.3  PROT 4.6*  ALBUMIN 2.1*    CBC:  Recent Labs Lab 05/02/15 0425  05/03/15 0258 05/03/15 1425 05/04/15 0454  WBC 19.2*   --  18.3*  --  14.6*  HGB 7.5*  < > 6.7* 9.5* 9.4*  HCT 22.3*  < > 19.5* 28.6* 27.8*  MCV 84.5  --  84.8  --  84.2  PLT 95*  --  110*  --  98*  < > = values in this interval not displayed.  Cardiac Enzymes:  Recent Labs Lab 04/30/15 1511 04/30/15 1945 05/01/15 0253  TROPONINI 3.54* 4.13* 2.29*    Coagulation:  Recent Labs Lab 04/30/15 0250 04/30/15 1530 05/03/15 0258  INR 3.43* 1.97* 1.58*    Radiology: Dg Chest Port 1 View  05/04/2015  CLINICAL DATA:  Hypoxia EXAM: PORTABLE CHEST - 1 VIEW COMPARISON:  05/03/2015 FINDINGS: Cardiac shadow is stable. Persistent left pleural effusion and basilar infiltrate are seen. Left jugular central line is again noted in the proximal superior vena cava. IMPRESSION: Persistent left basilar changes.  No new focal abnormality is seen. Electronically Signed   By: Inez Catalina M.D.   On: 05/04/2015 11:48   Dg Chest Port 1 View  05/03/2015  CLINICAL DATA:  Respiratory failure EXAM: PORTABLE CHEST 1 VIEW COMPARISON:  Portable chest x-ray of May 01, 2015 FINDINGS: The lungs are adequately inflated. There is persistent basilar atelectasis or pneumonia and small pleural effusion. The heart is top-normal in size. The pulmonary vascularity is mildly  prominent centrally. There is a left internal jugular venous catheter in place whose tip projects over the midportion of the SVC. The trachea and esophagus have been extubated. IMPRESSION: Stable appearance of the chest postextubation. Persistent left lower lobe atelectasis -pneumonia with small left pleural effusion. Electronically Signed   By: David  Martinique M.D.   On: 05/03/2015 07:36      Medications:   Scheduled Medications: . sodium chloride   Intravenous Once  . antiseptic oral rinse  7 mL Mouth Rinse BID  . insulin aspart  0-9 Units Subcutaneous TID WC  . levothyroxine  18.75 mcg Intravenous Daily  . pantoprazole (PROTONIX) IV  40 mg Intravenous Q12H    Infusions: . fentaNYL infusion  INTRAVENOUS Stopped (05/03/15 0700)    PRN Medications: sodium chloride, fentaNYL, midazolam, midazolam   Assessment and Plan:   1. Demand Ischemia in the setting of blood loss anemia: Troponin is trending down. She is asymptomatic. Echo demonstrated normal EF without WMA. She may benefit from OP stress MPI once she is over the GI bleed   2. Bradycardia: Heart rate in the 50's without rate control medications. One episode of Afib RVR earlier but otherwise remains stable.   3. Hypotension: Stable. No antihypertensives at this time. Only using prn lasix.   4. GI Bleed: S/P IR embolization of gastroduodenal artery. She has received 11 units of PRBC's and 3 units of FFP.  Hgb improving slowly.   5. Hypokalemia: Will give po repletion. Magnesium level is 2.2.   Phill Myron. Lawrence NP [place]  05/04/2015, 12:58 PM    History and all data above reviewed.  Patient examined.  I agree with the findings as above.  No pain.  No SOB.    The patient exam reveals COR:RRR  ,  Lungs: Few basilar crackles  ,  Abd: Positive bowel sounds, no rebound no guarding, Ext Mild edema  .  All available labs, radiology testing, previous records reviewed. Agree with documented assessment and plan. Plan as above.  OP stress test.  No other change in therapy.  We will follow as needed.    Jeneen Rinks Jakayla Schweppe  1:31 PM  05/04/2015

## 2015-05-04 NOTE — Progress Notes (Signed)
[facility] PROGRESS NOTE  Sophia [name] [id number] DOB: [birth year] DOA: 04/29/2015 PCP: Claretta Fraise, MD  Assessment/Plan: #1 upper GI bleed secondary to duodenal ulcer and gastroduodenal artery status post upper endoscopy 04/29/2015 showing a duodenal ulcer. Patient with continuous bleeding and status post IR embolization of gastroduodenal artery. Dr. Jarvis Newcomer 04/30/2015. Patient is status post 11 units packed red blood cells since admission, 3 units FFP, 1 unit for recent platelets. Hemoglobin still trended down and currently at 9.4 from 6.7 yesterday after 2 more units of packed red blood cells transfusion. Blood pressure has improved. Patient with 2 bouts of melanotic stool this morning. Check H&H this afternoon. Patient alert and following commands. Patient denies any abdominal pain. Continue IV PPI. Follow H&H. Transfusion threshold hemoglobin less than 7. Tolerating clears. Advance to full liquid diet. Per general surgery if patient was to become hemodynamically unstable with continued bleeding may require surgery however will treat conservatively for now and follow. General surgery following. GI signed off.  #2 acute blood loss anemia Secondary to problem #1. Status post 11 units packed red blood cells. Hemoglobin currently at 9.4 from 6.7 yesterday after 2 more units of transfused packed red blood cells. Patient with melanotic stools 2 this morning. Follow H&H.   #3 hemorrhagic shock Secondary to problem #1. Blood pressure improved. Off pressors. Monitor closely. Follow.  #4 status post vent dependent respiratory failure On admission patient was intubated and placed on the vent for airway protection. Patient is extubated. Patient protecting airway. Follow.  #5 acute on chronic kidney disease stage III Likely secondary to prerenal azotemia secondary to hemorrhagic shock vs volume overload. Patient receiving blood products and has had 11 units of PRBcs. UA with cultures and  sensitivities pending. Check a urine sodium. Check a urine creatinine.  IV fluids. Will give a dose of Lasix 20 mg IV 1 and follow.  #6 hypoxia Likely secondary to volume overload from significant blood products and aggressive fluid resuscitation on arrival S patient was in hemorrhagic shock. Patient status post 11 units packed red blood cells. Crackles noted on lung examination. Check a chest x-ray. Lasix 20 mg IV 1. Follow.  #7 hypokalemia/hypernatremia Replete potassium. Follow.  #8 leukocytosis Likely a stress the margination. Chest x-ray is negative for any acute infiltrate. Patient is afebrile. UA with cultures and sensitivities pending. Follow for now.  #9 elevated troponin/probable demand ischemia Troponins were elevated likely secondary to admission hemoglobin of 4. Patient denies any active chest pain. EKG with normal sinus rhythm with no signs of ischemia. 2-D echo with EF of 60-65% with no wall motion abnormalities with grade 1 diastolic dysfunction. Monitor closely for volume overload. Patient is being followed by cardiology and treatment limited by inability to give anticoagulation due to current bleed.  #10 hypothyroidism TSH at 6.292. Increase Synthroid to 18.75 mcg IV daily.    #11 diabetes mellitus Not on any outpatient medications. Hemoglobin A1c = 6. CBGs RANGED FROM 102-131.  [id number] acute metabolic encephalopathy secondary to sedation Resolved.  #13 prophylaxis PPI for GI prophylaxis.  Code Status: Full Family Communication: Updated patient. No family present. Disposition Plan: Remain in the step down unit.   Consultants:  PCCM admit : Dr. Lamonte Sakai 04/29/2015    cardiology Dr. Johnsie Cancel 04/30/2015  Gen. surgery Dr. Barry Dienes 04/30/2015  Interventional radiology Dr. Jarvis Newcomer 04/30/2015  Gastroenterology Dr. Watt Climes 04/29/2015   Procedures:  Upper endoscopy 04/29/2015 Dr. Watt Climes  Chest x-ray 05/03/2015, 05/01/2015, 04/29/2015  Abdominal films 04/29/2015  IR  embolization of gastroduodenal artery Dr  Hassell 04/30/2015  2-D echo 05/01/2015  ETT 12/12 >>12/14  [facility] CVL 12/14  R femoral 12/12 >12/14  11 units packed red blood cells since admission  3 units FFP  1 unit pheresed platelets  Antibiotics:  None  HPI/Subjective: Per nursing patient with melanotic/tarry stool with red specks per NT this morning. Patient on bedpan. Patient noted to have another melanotic stool.  Patient noted to be hypoxic with sats in 70s. No CP.   Objective: Filed Vitals:   05/04/15 0437 05/04/15 0721  BP: 109/52 115/62  Pulse: 81 56  Temp: 98.1 F (36.7 C) 97.8 F (36.6 C)  Resp: 18 14    Intake/Output Summary (Last 24 hours) at 05/04/15 0916 Last data filed at 05/04/15 0438  Gross per 24 hour  Intake    455 ml  Output    850 ml  Net   -395 ml   Filed Weights   05/02/15 0807 05/03/15 0500 05/04/15 0447  Weight: 77.8 kg (171 lb 8.3 oz) 77.3 kg (170 lb 6.7 oz) 74.7 kg (164 lb 10.9 oz)    Exam:   General:  NAD  Cardiovascular: RRR  Respiratory: Scattered crackles diffusely.  Abdomen: Soft/ND/+BS/NT  Musculoskeletal: No c/c/e  Data Reviewed: Basic Metabolic Panel:  Recent Labs Lab 04/30/15 0250 04/30/15 1511 05/01/15 0630 05/02/15 0425 05/03/15 0258 05/04/15 0454  NA 143 146* 144 148* 147* 147*  K 4.2 4.2 3.6 3.5 3.4* 3.4*  CL 126* 122* 117* 117* 118* 116*  CO2 7* 18* 21* 25 23 25   GLUCOSE 422* 199* 189* 107* 110* 107*  BUN 29* 31* 41* 46* 51* 47*  CREATININE 1.37* 2.07* 2.49* 2.59* 2.41* 2.14*  CALCIUM 4.9* 5.9* 5.7* 6.5* 6.7* 7.3*  MG 1.3*  --  1.7 1.7 1.8 2.2  PHOS 3.1  --  2.1* 2.7 2.5  --    Liver Function Tests:  Recent Labs Lab 04/29/15 1930  AST 24  ALT 15  ALKPHOS 37*  BILITOT 0.3  PROT 4.6*  ALBUMIN 2.1*   No results for input(s): LIPASE, AMYLASE in the last 168 hours. No results for input(s): AMMONIA in the last 168 hours. CBC:  Recent Labs Lab 05/01/15 0630  05/01/15 2100 05/02/15 0159  05/02/15 0425 05/02/15 0943 05/03/15 0258 05/03/15 1425 05/04/15 0454  WBC 17.2*  < > 19.3* 18.2* 19.2*  --  18.3*  --  14.6*  NEUTROABS 14.8*  --   --   --  16.6*  --  15.5*  --  11.4*  HGB 8.1*  < > 7.7* 7.3* 7.5* 7.4* 6.7* 9.5* 9.4*  HCT 23.0*  < > 22.1* 21.7* 22.3* 22.0* 19.5* 28.6* 27.8*  MCV 82.4  < > 83.1 84.4 84.5  --  84.8  --  84.2  PLT 99*  < > 105* 95* 95*  --  110*  --  98*  < > = values in this interval not displayed. Cardiac Enzymes:  Recent Labs Lab 04/30/15 1123 04/30/15 1511 04/30/15 1945 05/01/15 0253  TROPONINI 2.13* 3.54* 4.13* 2.29*   BNP (last 3 results) No results for input(s): BNP in the last 8760 hours.  ProBNP (last 3 results) No results for input(s): PROBNP in the last 8760 hours.  CBG:  Recent Labs Lab 05/02/15 0806 05/02/15 1139 05/03/15 1440 05/03/15 1733 05/03/15 2150  GLUCAP 100* 121* 131* 102* 112*    Recent Results (from the past 240 hour(s))  MRSA PCR Screening     Status: None   Collection Time: 04/30/15  2:24 AM  Result Value Ref Range Status   MRSA by PCR NEGATIVE NEGATIVE Final    Comment:        The GeneXpert MRSA Assay (FDA approved for NASAL specimens only), is one component of a comprehensive MRSA colonization surveillance program. It is not intended to diagnose MRSA infection nor to guide or monitor treatment for MRSA infections.      Studies: Dg Chest Port 1 View  05/03/2015  CLINICAL DATA:  Respiratory failure EXAM: PORTABLE CHEST 1 VIEW COMPARISON:  Portable chest x-ray of May 01, 2015 FINDINGS: The lungs are adequately inflated. There is persistent basilar atelectasis or pneumonia and small pleural effusion. The heart is top-normal in size. The pulmonary vascularity is mildly prominent centrally. There is a left internal jugular venous catheter in place whose tip projects over the midportion of the SVC. The trachea and esophagus have been extubated. IMPRESSION: Stable appearance of the chest  postextubation. Persistent left lower lobe atelectasis -pneumonia with small left pleural effusion. Electronically Signed   By: David  Martinique M.D.   On: 05/03/2015 07:36    Scheduled Meds: . sodium chloride   Intravenous Once  . antiseptic oral rinse  7 mL Mouth Rinse BID  . insulin aspart  0-9 Units Subcutaneous TID WC  . levothyroxine  12.5 mcg Intravenous Daily  . pantoprazole (PROTONIX) IV  40 mg Intravenous Q12H   Continuous Infusions: . dextrose 1,000 mL (05/04/15 0847)  . fentaNYL infusion INTRAVENOUS Stopped (05/03/15 0700)    Principal Problem:   Gastrointestinal hemorrhage associated with duodenal ulcer Active Problems:   Hemorrhagic shock   HTN (hypertension)   Diabetes mellitus without complication (HCC)   Chronic kidney disease   Upper GI bleed   Acute respiratory failure (HCC)   Metabolic acidosis   Acute respiratory failure with hypoxia (HCC)   Elevated troponin   Acute kidney injury superimposed on CKD ([place])   Leukocytosis   Acute blood loss anemia   Thyroid activity decreased   Hypernatremia   Hypoxia    Time spent: 45 mins     [facility] Gnaden Huetten MD [facility] Pager [phone]. If 7PM-7AM, please contact night-coverage at [email or web address], password [facility] 05/04/2015, 9:16 AM  LOS: 5 days

## 2015-05-05 LAB — CBC WITH DIFFERENTIAL/PLATELET
Basophils Absolute: 0 10*3/uL (ref 0.0–0.1)
Basophils Relative: 0 %
Eosinophils Absolute: 0.5 10*3/uL (ref 0.0–0.7)
Eosinophils Relative: 4 %
HCT: 28.9 % — ABNORMAL LOW (ref 36.0–46.0)
Hemoglobin: 9.7 g/dL — ABNORMAL LOW (ref 12.0–15.0)
Lymphocytes Relative: 13 %
Lymphs Abs: 1.7 10*3/uL (ref 0.7–4.0)
MCH: 28.8 pg (ref 26.0–34.0)
MCHC: 33.6 g/dL (ref 30.0–36.0)
MCV: 85.8 fL (ref 78.0–100.0)
Monocytes Absolute: 1 10*3/uL (ref 0.1–1.0)
Monocytes Relative: 7 %
Neutro Abs: 10.4 10*3/uL — ABNORMAL HIGH (ref 1.7–7.7)
Neutrophils Relative %: 76 %
Platelets: 125 10*3/uL — ABNORMAL LOW (ref 150–400)
RBC: 3.37 MIL/uL — ABNORMAL LOW (ref 3.87–5.11)
RDW: 18.7 % — ABNORMAL HIGH (ref 11.5–15.5)
WBC: 13.7 10*3/uL — ABNORMAL HIGH (ref 4.0–10.5)

## 2015-05-05 LAB — BASIC METABOLIC PANEL
Anion gap: 4 — ABNORMAL LOW (ref 5–15)
BUN: 39 mg/dL — ABNORMAL HIGH (ref 6–20)
CO2: 25 mmol/L (ref 22–32)
Calcium: 7.4 mg/dL — ABNORMAL LOW (ref 8.9–10.3)
Chloride: 116 mmol/L — ABNORMAL HIGH (ref 101–111)
Creatinine, Ser: 1.94 mg/dL — ABNORMAL HIGH (ref 0.44–1.00)
GFR calc Af Amer: 27 mL/min — ABNORMAL LOW (ref >60)
GFR calc non Af Amer: 23 mL/min — ABNORMAL LOW (ref >60)
Glucose, Bld: 116 mg/dL — ABNORMAL HIGH (ref 65–99)
Potassium: 3.5 mmol/L (ref 3.5–5.1)
Sodium: 145 mmol/L (ref 135–145)

## 2015-05-05 LAB — GLUCOSE, CAPILLARY
Glucose-Capillary: 95 mg/dL (ref 65–99)
Glucose-Capillary: 116 mg/dL — ABNORMAL HIGH (ref 65–99)
Glucose-Capillary: 133 mg/dL — ABNORMAL HIGH (ref 65–99)

## 2015-05-05 LAB — MAGNESIUM: Magnesium: 1.9 mg/dL (ref 1.7–2.4)

## 2015-05-05 MED ORDER — LEVOTHYROXINE SODIUM 75 MCG PO TABS
37.5000 ug | ORAL_TABLET | Freq: Every day | ORAL | Status: DC
  Administered 2015-05-06 – 2015-05-08 (×3): 37.5 ug via ORAL
  Filled 2015-05-05 (×3): qty 1

## 2015-05-05 NOTE — Progress Notes (Signed)
[facility] PROGRESS NOTE  Sophia [name] [id number] DOB: [birth year] DOA: 04/29/2015 PCP: Claretta Fraise, MD  Assessment/Plan: #1 upper GI bleed secondary to duodenal ulcer and gastroduodenal artery status post upper endoscopy 04/29/2015 showing a duodenal ulcer. Patient with continuous bleeding and status post IR embolization of gastroduodenal artery. Dr. Jarvis Newcomer 04/30/2015. Patient is status post 11 units packed red blood cells since admission, 3 units FFP, 1 unit for recent platelets. Hemoglobin still trended down and currently at 9.4 from 6.7 yesterday after 2 more units of packed red blood cells transfusion. Blood pressure has improved. Patient with 1 bouts of melanotic stool this morning.  Hemoglobin seems to have stabilized at 9.7. Patient alert and following commands. Patient denies any abdominal pain. Continue IV PPI. Follow H&H. Transfusion threshold hemoglobin less than 7. Tolerating full liquid diet.  Advanced to a soft diet. Per general surgery if patient was to become hemodynamically unstable with continued bleeding may require surgery however will treat conservatively for now and follow. General surgery following.   #2 acute blood loss anemia Secondary to problem #1. Status post 11 units packed red blood cells. Hemoglobin currently at  9.7 from 9.4 from 6.7. Patient with melanotic/ tarry stools 1 this morning. Follow H&H.   #3 hemorrhagic shock Secondary to problem #1. Blood pressure improved. Off pressors. Monitor closely. Follow.  #4 status post vent dependent respiratory failure On admission patient was intubated and placed on the vent for airway protection. Patient is extubated. Patient protecting airway. Follow.  #5 acute on chronic kidney disease stage III Likely secondary to prerenal azotemia secondary to hemorrhagic shock vs volume overload. Patient receiving blood products and has had 11 units of PRBcs. UA  Nitrite negative leukocytes negative. Renal function  trending down. Follow.  #6 hypoxia Likely secondary to volume overload from significant blood products and aggressive fluid resuscitation on arrival S patient was in hemorrhagic shock. Patient status post 11 units packed red blood cells. Crackles noted on lung examination yesterday. Chest x-ray negative. Follow.  #7 hypokalemia/hypernatremia Replete potassium. Follow.  #8 leukocytosis Likely a stress the margination. Chest x-ray is negative for any acute infiltrate.  Urinalysis is negative. Patient afebrile. WBC trending down. Follow for now.  #9 elevated troponin/probable demand ischemia Troponins were elevated likely secondary to admission hemoglobin of 4. Patient denies any active chest pain. EKG with normal sinus rhythm with no signs of ischemia. 2-D echo with EF of 60-65% with no wall motion abnormalities with grade 1 diastolic dysfunction. Monitor closely for volume overload. Patient is being followed by cardiology and treatment limited by inability to give anticoagulation due to current bleed. Patient for outpatient stress test per cardiology recommendations.  #10 hypothyroidism TSH at 6.292.  Change IV Synthroid to oral Synthroid 37.5 MCG's daily.    #11 diabetes mellitus Not on any outpatient medications. Hemoglobin A1c = 6. CBGs RANGED FROM 95-176.  [id number] acute metabolic encephalopathy secondary to sedation Resolved.  #13 prophylaxis PPI for GI prophylaxis.  Code Status: Full Family Communication: Updated patient. No family present. Disposition Plan: Remain in the step down unit.   Consultants:  PCCM admit : Dr. Lamonte Sakai 04/29/2015    cardiology Dr. Johnsie Cancel 04/30/2015  Gen. surgery Dr. Barry Dienes 04/30/2015  Interventional radiology Dr. Jarvis Newcomer 04/30/2015  Gastroenterology Dr. Watt Climes 04/29/2015   Procedures:  Upper endoscopy 04/29/2015 Dr. Watt Climes  Chest x-ray 05/03/2015, 05/01/2015, 04/29/2015, 05/04/2015  Abdominal films 04/29/2015  IR embolization of gastroduodenal  artery Dr Vernard Gambles 04/30/2015  2-D echo 05/01/2015  ETT 12/12 >>12/14  [facility] CVL  12/14  R femoral 12/12 >12/14  11 units packed red blood cells since admission  3 units FFP  1 unit pheresed platelets  Antibiotics:  None  HPI/Subjective: Per nursing patient with tarry stools. No CP. No SOB. Tolerating full liquid diet.  Objective: Filed Vitals:   05/05/15 0425 05/05/15 0800  BP: 108/52 134/61  Pulse: 64 62  Temp: 97.9 F (36.6 C) 97.2 F (36.2 C)  Resp: 17 15    Intake/Output Summary (Last 24 hours) at 05/05/15 1031 Last data filed at 05/05/15 1000  Gross per 24 hour  Intake    860 ml  Output    400 ml  Net    460 ml   Filed Weights   05/03/15 0500 05/04/15 0447 05/05/15 0425  Weight: 77.3 kg (170 lb 6.7 oz) 74.7 kg (164 lb 10.9 oz) 74.4 kg (164 lb 0.4 oz)    Exam:   General:  NAD  Cardiovascular: RRR  Respiratory:Bibasilar crackles. No wheezing.  Abdomen: Soft/ND/+BS/NT  Musculoskeletal: No c/c/e  Data Reviewed: Basic Metabolic Panel:  Recent Labs Lab 04/30/15 0250  05/01/15 0630 05/02/15 0425 05/03/15 0258 05/04/15 0454 05/05/15 0320  NA 143  < > 144 148* 147* 147* 145  K 4.2  < > 3.6 3.5 3.4* 3.4* 3.5  CL 126*  < > 117* 117* 118* 116* 116*  CO2 7*  < > 21* 25 23 25 25   GLUCOSE 422*  < > 189* 107* 110* 107* 116*  BUN 29*  < > 41* 46* 51* 47* 39*  CREATININE 1.37*  < > 2.49* 2.59* 2.41* 2.14* 1.94*  CALCIUM 4.9*  < > 5.7* 6.5* 6.7* 7.3* 7.4*  MG 1.3*  --  1.7 1.7 1.8 2.2 1.9  PHOS 3.1  --  2.1* 2.7 2.5  --   --   < > = values in this interval not displayed. Liver Function Tests:  Recent Labs Lab 04/29/15 1930  AST 24  ALT 15  ALKPHOS 37*  BILITOT 0.3  PROT 4.6*  ALBUMIN 2.1*   No results for input(s): LIPASE, AMYLASE in the last 168 hours. No results for input(s): AMMONIA in the last 168 hours. CBC:  Recent Labs Lab 05/01/15 0630  05/02/15 0159 05/02/15 0425  05/03/15 0258 05/03/15 1425 05/04/15 0454 05/04/15 1400  05/05/15 0320  WBC 17.2*  < > 18.2* 19.2*  --  18.3*  --  14.6*  --  13.7*  NEUTROABS 14.8*  --   --  16.6*  --  15.5*  --  11.4*  --  10.4*  HGB 8.1*  < > 7.3* 7.5*  < > 6.7* 9.5* 9.4* 10.6* 9.7*  HCT 23.0*  < > 21.7* 22.3*  < > 19.5* 28.6* 27.8* 31.7* 28.9*  MCV 82.4  < > 84.4 84.5  --  84.8  --  84.2  --  85.8  PLT 99*  < > 95* 95*  --  110*  --  98*  --  125*  < > = values in this interval not displayed. Cardiac Enzymes:  Recent Labs Lab 04/30/15 1123 04/30/15 1511 04/30/15 1945 05/01/15 0253  TROPONINI 2.13* 3.54* 4.13* 2.29*   BNP (last 3 results) No results for input(s): BNP in the last 8760 hours.  ProBNP (last 3 results) No results for input(s): PROBNP in the last 8760 hours.  CBG:  Recent Labs Lab 05/03/15 2150 05/04/15 1309 05/04/15 1731 05/04/15 2144 05/05/15 0846  GLUCAP 112* 105* 125* 176* 95    Recent Results (from  the past 240 hour(s))  MRSA PCR Screening     Status: None   Collection Time: 04/30/15  2:24 AM  Result Value Ref Range Status   MRSA by PCR NEGATIVE NEGATIVE Final    Comment:        The GeneXpert MRSA Assay (FDA approved for NASAL specimens only), is one component of a comprehensive MRSA colonization surveillance program. It is not intended to diagnose MRSA infection nor to guide or monitor treatment for MRSA infections.      Studies: Dg Chest Port 1 View  05/04/2015  CLINICAL DATA:  Hypoxia EXAM: PORTABLE CHEST - 1 VIEW COMPARISON:  05/03/2015 FINDINGS: Cardiac shadow is stable. Persistent left pleural effusion and basilar infiltrate are seen. Left jugular central line is again noted in the proximal superior vena cava. IMPRESSION: Persistent left basilar changes.  No new focal abnormality is seen. Electronically Signed   By: Inez Catalina M.D.   On: 05/04/2015 11:48    Scheduled Meds: . sodium chloride   Intravenous Once  . antiseptic oral rinse  7 mL Mouth Rinse BID  . insulin aspart  0-9 Units Subcutaneous TID WC  .  levothyroxine  18.75 mcg Intravenous Daily  . pantoprazole (PROTONIX) IV  40 mg Intravenous Q12H   Continuous Infusions: . fentaNYL infusion INTRAVENOUS Stopped (05/03/15 0700)    Principal Problem:   Gastrointestinal hemorrhage associated with duodenal ulcer Active Problems:   Hemorrhagic shock   HTN (hypertension)   Diabetes mellitus without complication (HCC)   Chronic kidney disease   Upper GI bleed   Acute respiratory failure (HCC)   Metabolic acidosis   Acute respiratory failure with hypoxia (HCC)   Elevated troponin   Acute kidney injury superimposed on CKD ([place])   Leukocytosis   Acute blood loss anemia   Thyroid activity decreased   Hypernatremia   Hypoxia    Time spent: 45 mins     [facility] MD [facility] Pager [phone]. If 7PM-7AM, please contact night-coverage at [email or web address], password [facility] 05/05/2015, 10:31 AM  LOS: 6 days

## 2015-05-05 NOTE — Progress Notes (Signed)
  Progress Note: General Surgery Service   Subjective: Stood up yesterday. No bloody BMs overnight. No abdominal pain or nausea or vomiting. Tolerating liquids  Objective: Vital signs in last 24 hours: Temp:  [97.3 F (36.3 C)-99 F (37.2 C)] 97.9 F (36.6 C) (12/18 0425) Pulse Rate:  [57-76] 64 (12/18 0425) Resp:  [17-21] 17 (12/18 0425) BP: (77-124)/(52-59) 108/52 mmHg (12/18 0425) SpO2:  [92 %-100 %] 98 % (12/18 0425) Weight:  [74.4 kg (164 lb 0.4 oz)] 74.4 kg (164 lb 0.4 oz) (12/18 0425) Last BM Date: 05/04/15  Intake/Output from previous day: 12/17 0701 - 12/18 0700 In: 1150 [P.O.:1100; I.V.:50] Out: 400 [Urine:400] Intake/Output this shift:    Abd: soft, Non tender nondistended  Extremities: ecchymosis of right arm  Neuro: AOx4  Lab Results: CBC   Recent Labs  05/04/15 0454 05/04/15 1400 05/05/15 0320  WBC 14.6*  --  13.7*  HGB 9.4* 10.6* 9.7*  HCT 27.8* 31.7* 28.9*  PLT 98*  --  125*   BMET  Recent Labs  05/04/15 0454 05/05/15 0320  NA 147* 145  K 3.4* 3.5  CL 116* 116*  CO2 25 25  GLUCOSE 107* 116*  BUN 47* 39*  CREATININE 2.14* 1.94*  CALCIUM 7.3* 7.4*   PT/INR  Recent Labs  05/03/15 0258  LABPROT 18.9*  INR 1.58*   ABG No results for input(s): PHART, HCO3 in the last 72 hours.  Invalid input(s): PCO2, PO2  Studies/Results:  Anti-infectives: Anti-infectives    None      Medications: Scheduled Meds: . sodium chloride   Intravenous Once  . antiseptic oral rinse  7 mL Mouth Rinse BID  . insulin aspart  0-9 Units Subcutaneous TID WC  . levothyroxine  18.75 mcg Intravenous Daily  . pantoprazole (PROTONIX) IV  40 mg Intravenous Q12H   Continuous Infusions: . fentaNYL infusion INTRAVENOUS Stopped (05/03/15 0700)   PRN Meds:.sodium chloride, fentaNYL, midazolam, midazolam  Assessment/Plan: Patient Active Problem List   Diagnosis Date Noted  . Hypernatremia 05/04/2015  . Hypoxia 05/04/2015  . Acute kidney injury  superimposed on CKD ([place]) 05/03/2015  . Leukocytosis 05/03/2015  . Acute blood loss anemia 05/03/2015  . Thyroid activity decreased   . Metabolic acidosis 0000000  . Acute respiratory failure with hypoxia ([place]) 04/30/2015  . Elevated troponin 04/30/2015  . Gastrointestinal hemorrhage associated with duodenal ulcer   . Acute respiratory failure ([place])   . Hemorrhagic shock   . Upper GI bleed 04/29/2015  . Diabetes mellitus without complication ([place])   . Chronic kidney disease   . Elevated random blood glucose level 01/05/2013  . HLD (hyperlipidemia) 10/24/2012  . HTN (hypertension) 10/24/2012  . Osteoporosis, unspecified 10/24/2012  . Unspecified vitamin D deficiency 10/24/2012  . Hyperglycemia 10/24/2012  . Special screening for malignant neoplasms, colon 07/20/2011  . Benign neoplasm of colon 07/20/2011  . Diverticulosis of colon (without mention of hemorrhage) 07/20/2011   s/p Procedure(s): ESOPHAGOGASTRODUODENOSCOPY (EGD) 04/29/2015 HGB stable today. Tolerating liquids -recommend advancing diet and adding protein supplementation -I do not think she needs surgery at this time, please call for new concerns   LOS: 6 days   Mickeal Skinner, MD Pg# [phone] [facility]

## 2015-05-05 NOTE — Progress Notes (Signed)
Central Monitor called with report of 19 beat run SVT. Pt is sleeping soundly with no noted distress. Reported 12 beat run of SVT, previous night. Night Hospitalist text with update at this time.

## 2015-05-06 DIAGNOSIS — N390 Urinary tract infection, site not specified: Secondary | ICD-10-CM

## 2015-05-06 DIAGNOSIS — B9620 Unspecified Escherichia coli [E. coli] as the cause of diseases classified elsewhere: Secondary | ICD-10-CM

## 2015-05-06 LAB — BASIC METABOLIC PANEL WITH GFR
Anion gap: 6 (ref 5–15)
BUN: 31 mg/dL — ABNORMAL HIGH (ref 6–20)
CO2: 27 mmol/L (ref 22–32)
Calcium: 7.4 mg/dL — ABNORMAL LOW (ref 8.9–10.3)
Chloride: 111 mmol/L (ref 101–111)
Creatinine, Ser: 1.67 mg/dL — ABNORMAL HIGH (ref 0.44–1.00)
GFR calc Af Amer: 32 mL/min — ABNORMAL LOW
GFR calc non Af Amer: 28 mL/min — ABNORMAL LOW
Glucose, Bld: 101 mg/dL — ABNORMAL HIGH (ref 65–99)
Potassium: 3.3 mmol/L — ABNORMAL LOW (ref 3.5–5.1)
Sodium: 144 mmol/L (ref 135–145)

## 2015-05-06 LAB — URINE CULTURE: Culture: >=100000

## 2015-05-06 LAB — CBC
HCT: 29.6 % — ABNORMAL LOW (ref 36.0–46.0)
Hemoglobin: 9.4 g/dL — ABNORMAL LOW (ref 12.0–15.0)
MCH: 28 pg (ref 26.0–34.0)
MCHC: 31.8 g/dL (ref 30.0–36.0)
MCV: 88.1 fL (ref 78.0–100.0)
Platelets: 162 10*3/uL (ref 150–400)
RBC: 3.36 MIL/uL — ABNORMAL LOW (ref 3.87–5.11)
RDW: 18.2 % — ABNORMAL HIGH (ref 11.5–15.5)
WBC: 10.9 10*3/uL — ABNORMAL HIGH (ref 4.0–10.5)

## 2015-05-06 LAB — GLUCOSE, CAPILLARY
Glucose-Capillary: 90 mg/dL (ref 65–99)
Glucose-Capillary: 136 mg/dL — ABNORMAL HIGH (ref 65–99)
Glucose-Capillary: 149 mg/dL — ABNORMAL HIGH (ref 65–99)
Glucose-Capillary: 160 mg/dL — ABNORMAL HIGH (ref 65–99)

## 2015-05-06 LAB — MAGNESIUM: Magnesium: 1.7 mg/dL (ref 1.7–2.4)

## 2015-05-06 MED ORDER — SIMVASTATIN 20 MG PO TABS
20.0000 mg | ORAL_TABLET | Freq: Every day | ORAL | Status: DC
  Administered 2015-05-06 – 2015-05-07 (×2): 20 mg via ORAL
  Filled 2015-05-06 (×2): qty 1

## 2015-05-06 MED ORDER — HYOSCYAMINE SULFATE 0.125 MG SL SUBL: 0.1250 mg | SUBLINGUAL_TABLET | SUBLINGUAL | Status: DC | PRN

## 2015-05-06 MED ORDER — MENTHOL 3 MG MT LOZG
1.0000 | LOZENGE | OROMUCOSAL | Status: DC | PRN
  Administered 2015-05-06: 3 mg via ORAL
  Filled 2015-05-06 (×2): qty 9

## 2015-05-06 MED ORDER — CARVEDILOL 3.125 MG PO TABS
3.1250 mg | ORAL_TABLET | Freq: Two times a day (BID) | ORAL | Status: DC
  Administered 2015-05-06 – 2015-05-08 (×5): 3.125 mg via ORAL
  Filled 2015-05-06 (×5): qty 1

## 2015-05-06 MED ORDER — DEXTROSE 5 % IV SOLN
1.0000 g | INTRAVENOUS | Status: DC
  Administered 2015-05-06 – 2015-05-07 (×2): 1 g via INTRAVENOUS
  Filled 2015-05-06 (×2): qty 10

## 2015-05-06 MED ORDER — POTASSIUM CHLORIDE CRYS ER 20 MEQ PO TBCR
40.0000 meq | EXTENDED_RELEASE_TABLET | Freq: Once | ORAL | Status: AC
  Administered 2015-05-06: 40 meq via ORAL
  Filled 2015-05-06: qty 2

## 2015-05-06 MED ORDER — PANTOPRAZOLE SODIUM 40 MG PO TBEC
40.0000 mg | DELAYED_RELEASE_TABLET | Freq: Two times a day (BID) | ORAL | Status: DC
  Administered 2015-05-06 – 2015-05-08 (×5): 40 mg via ORAL
  Filled 2015-05-06 (×6): qty 1

## 2015-05-06 MED ORDER — MAGNESIUM SULFATE 4 GM/100ML IV SOLN
4.0000 g | Freq: Once | INTRAVENOUS | Status: AC
  Administered 2015-05-06: 4 g via INTRAVENOUS
  Filled 2015-05-06: qty 100

## 2015-05-06 MED ORDER — NEOMYCIN-POLYMYXIN-DEXAMETH 3.5-10000-0.1 OP OINT
1.0000 "application " | TOPICAL_OINTMENT | Freq: Every day | OPHTHALMIC | Status: DC
  Administered 2015-05-07: 1 via OPHTHALMIC
  Filled 2015-05-06: qty 3.5

## 2015-05-06 NOTE — Progress Notes (Signed)
[facility] PROGRESS NOTE  Sophia [name] [id number] DOB: 03-05-1932 DOA: 04/29/2015 PCP: Claretta Fraise, MD  Assessment/Plan: #1 upper GI bleed secondary to duodenal ulcer and gastroduodenal artery status post upper endoscopy 04/29/2015 showing a duodenal ulcer. Patient with continuous bleeding and status post IR embolization of gastroduodenal artery. Dr. Jarvis Newcomer 04/30/2015. Patient is status post 11 units packed red blood cells since admission, 3 units FFP, 1 unit for recent platelets. Hemoglobin still trended down and currently at 9.4 from 9.7 from 9.4 from 6.7. Blood pressure has improved. Patient with brown stool this morning and had some tarry stools 1-2 days ago.  Hemoglobin seems to have stabilized at 9.4. Patient alert and following commands. Patient denies any abdominal pain. Change IV PPI to oral Protonix twice daily. Follow H&H. Transfusion threshold hemoglobin less than 7. Tolerating soft diet. Per general surgery if patient was to become hemodynamically unstable with continued bleeding may require surgery however will treat conservatively for now and follow. General surgery following.   #2 acute blood loss anemia Secondary to problem #1. Status post 11 units packed red blood cells. Hemoglobin currently at  9.4 9.7 from 9.4 from 6.7. Patient with brown stools today per nursing tech. The past 2 mornings patient has had melanotic/ tarry stools. Follow H&H.   #3 hemorrhagic shock Secondary to problem #1. Blood pressure improved. Off pressors. Monitor closely. Follow.  #4 status post vent dependent respiratory failure On admission patient was intubated and placed on the vent for airway protection. Patient is extubated. Patient protecting airway. Follow.  #5 acute on chronic kidney disease stage III Likely secondary to prerenal azotemia secondary to hemorrhagic shock vs volume overload. Patient received blood products and has had 11 units of PRBcs. UA  Nitrite negative  leukocytes negative. Urinalysis negative for proteinuria or glucose. Renal function trending down. Follow.  #6 hypoxia Likely secondary to volume overload from significant blood products and aggressive fluid resuscitation on arrival S patient was in hemorrhagic shock. Patient status post 11 units packed red blood cells. Crackles noted on lung examination 2 days ago. Chest x-ray negative. Follow.  #7 hypokalemia/hypernatremia Replete potassium. Hypernatremia improved. Follow.  #8 leukocytosis Likely a stress the margination and secondary to Escherichia coli UTI. Chest x-ray is negative for any acute infiltrate.  Urinalysis is negative. Urine cultures with greater than 100,000 Escherichia coli. Will place patient on IV Rocephin.  #9 elevated troponin/probable demand ischemia Troponins were elevated likely secondary to admission hemoglobin of 4. Patient denies any active chest pain. EKG with normal sinus rhythm with no signs of ischemia. 2-D echo with EF of 60-65% with no wall motion abnormalities with grade 1 diastolic dysfunction. Monitor closely for volume overload. Patient is being followed by cardiology and treatment limited by inability to give anticoagulation due to current bleed. Will start patient on low-dose Coreg as hypotension has improved. Patient for outpatient stress test per cardiology recommendations.  #10 hypothyroidism TSH at 6.292.  Continue oral Synthroid 37.5 MCG's daily.    #11 diabetes mellitus Not on any outpatient medications. Hemoglobin A1c = 6. CBGs RANGED FROM 90-133. Sliding scale insulin.  #12 EColi UTI IV Rocephin  [id number] acute metabolic encephalopathy secondary to sedation Resolved.  #14 prophylaxis PPI for GI prophylaxis.  Code Status: Full Family Communication: Updated patient. No family present. Disposition Plan: Transfer to telemetry.   Consultants:  PCCM admit : Dr. Lamonte Sakai 04/29/2015    cardiology Dr. Johnsie Cancel 04/30/2015  Gen. surgery Dr. Barry Dienes  04/30/2015  Interventional radiology Dr. Jarvis Newcomer 04/30/2015  Gastroenterology Dr.  Magod 04/29/2015   Procedures:  Upper endoscopy 04/29/2015 Dr. Watt Climes  Chest x-ray 05/03/2015, 05/01/2015, 04/29/2015, 05/04/2015  Abdominal films 04/29/2015  IR embolization of gastroduodenal artery Dr Vernard Gambles 04/30/2015  2-D echo 05/01/2015  ETT 12/12 >>12/14  [facility] CVL 12/14  R femoral 12/12 >12/14  11 units packed red blood cells since admission  3 units FFP  1 unit pheresed platelets  Antibiotics:  IV Rocephin 05/06/2015  HPI/Subjective: Per nurse tech patient with brown stool this morning. Patient denies any shortness of breath. No chest pain. Patient tolerating soft diet.  Objective: Filed Vitals:   05/06/15 0400 05/06/15 0727  BP: 119/60 132/62  Pulse: 59 78  Temp:  97.9 F (36.6 C)  Resp: 17     Intake/Output Summary (Last 24 hours) at 05/06/15 0955 Last data filed at 05/06/15 0906  Gross per 24 hour  Intake   1280 ml  Output    750 ml  Net    530 ml   Filed Weights   05/03/15 0500 05/04/15 0447 05/05/15 0425  Weight: 77.3 kg (170 lb 6.7 oz) 74.7 kg (164 lb 10.9 oz) 74.4 kg (164 lb 0.4 oz)    Exam:   General:  NAD  Cardiovascular: RRR  Respiratory:Decreased BS in bases. No wheezing.  Abdomen: Soft/ND/+BS/NT  Musculoskeletal: No c/c/e  Data Reviewed: Basic Metabolic Panel:  Recent Labs Lab 04/30/15 0250  05/01/15 0630 05/02/15 0425 05/03/15 0258 05/04/15 0454 05/05/15 0320 05/06/15 0450  NA 143  < > 144 148* 147* 147* 145 144  K 4.2  < > 3.6 3.5 3.4* 3.4* 3.5 3.3*  CL 126*  < > 117* 117* 118* 116* 116* 111  CO2 7*  < > 21* 25 23 25 25 27   GLUCOSE 422*  < > 189* 107* 110* 107* 116* 101*  BUN 29*  < > 41* 46* 51* 47* 39* 31*  CREATININE 1.37*  < > 2.49* 2.59* 2.41* 2.14* 1.94* 1.67*  CALCIUM 4.9*  < > 5.7* 6.5* 6.7* 7.3* 7.4* 7.4*  MG 1.3*  --  1.7 1.7 1.8 2.2 1.9 1.7  PHOS 3.1  --  2.1* 2.7 2.5  --   --   --   < > = values in this interval  not displayed. Liver Function Tests:  Recent Labs Lab 04/29/15 1930  AST 24  ALT 15  ALKPHOS 37*  BILITOT 0.3  PROT 4.6*  ALBUMIN 2.1*   No results for input(s): LIPASE, AMYLASE in the last 168 hours. No results for input(s): AMMONIA in the last 168 hours. CBC:  Recent Labs Lab 05/01/15 0630  05/02/15 0425  05/03/15 0258 05/03/15 1425 05/04/15 0454 05/04/15 1400 05/05/15 0320 05/06/15 0450  WBC 17.2*  < > 19.2*  --  18.3*  --  14.6*  --  13.7* 10.9*  NEUTROABS 14.8*  --  16.6*  --  15.5*  --  11.4*  --  10.4*  --   HGB 8.1*  < > 7.5*  < > 6.7* 9.5* 9.4* 10.6* 9.7* 9.4*  HCT 23.0*  < > 22.3*  < > 19.5* 28.6* 27.8* 31.7* 28.9* 29.6*  MCV 82.4  < > 84.5  --  84.8  --  84.2  --  85.8 88.1  PLT 99*  < > 95*  --  110*  --  98*  --  125* 162  < > = values in this interval not displayed. Cardiac Enzymes:  Recent Labs Lab 04/30/15 1123 04/30/15 1511 04/30/15 1945 05/01/15 0253  TROPONINI  2.13* 3.54* 4.13* 2.29*   BNP (last 3 results) No results for input(s): BNP in the last 8760 hours.  ProBNP (last 3 results) No results for input(s): PROBNP in the last 8760 hours.  CBG:  Recent Labs Lab 05/04/15 2144 05/05/15 0846 05/05/15 1313 05/05/15 1655 05/06/15 0811  GLUCAP 176* 95 116* 133* 90    Recent Results (from the past 240 hour(s))  MRSA PCR Screening     Status: None   Collection Time: 04/30/15  2:24 AM  Result Value Ref Range Status   MRSA by PCR NEGATIVE NEGATIVE Final    Comment:        The GeneXpert MRSA Assay (FDA approved for NASAL specimens only), is one component of a comprehensive MRSA colonization surveillance program. It is not intended to diagnose MRSA infection nor to guide or monitor treatment for MRSA infections.   Culture, Urine     Status: None   Collection Time: 05/04/15 12:00 PM  Result Value Ref Range Status   Specimen Description URINE, CATHETERIZED  Final   Special Requests NONE  Final   Culture >=100,000 COLONIES/mL  ESCHERICHIA COLI  Final   Report Status 05/06/2015 FINAL  Final   Organism ID, Bacteria ESCHERICHIA COLI  Final      Susceptibility   Escherichia coli - MIC*    AMPICILLIN <=2 SENSITIVE Sensitive     CEFAZOLIN <=4 SENSITIVE Sensitive     CEFTRIAXONE <=1 SENSITIVE Sensitive     CIPROFLOXACIN <=0.25 SENSITIVE Sensitive     GENTAMICIN <=1 SENSITIVE Sensitive     IMIPENEM <=0.25 SENSITIVE Sensitive     NITROFURANTOIN <=16 SENSITIVE Sensitive     TRIMETH/SULFA <=20 SENSITIVE Sensitive     AMPICILLIN/SULBACTAM <=2 SENSITIVE Sensitive     PIP/TAZO <=4 SENSITIVE Sensitive     * >=100,000 COLONIES/mL ESCHERICHIA COLI     Studies: Dg Chest Port 1 View  05/04/2015  CLINICAL DATA:  Hypoxia EXAM: PORTABLE CHEST - 1 VIEW COMPARISON:  05/03/2015 FINDINGS: Cardiac shadow is stable. Persistent left pleural effusion and basilar infiltrate are seen. Left jugular central line is again noted in the proximal superior vena cava. IMPRESSION: Persistent left basilar changes.  No new focal abnormality is seen. Electronically Signed   By: Inez Catalina M.D.   On: 05/04/2015 11:48    Scheduled Meds: . sodium chloride   Intravenous Once  . antiseptic oral rinse  7 mL Mouth Rinse BID  . insulin aspart  0-9 Units Subcutaneous TID WC  . levothyroxine  37.5 mcg Oral QAC breakfast  . magnesium sulfate 1 - 4 g bolus IVPB  4 g Intravenous Once  . pantoprazole (PROTONIX) IV  40 mg Intravenous Q12H   Continuous Infusions: . fentaNYL infusion INTRAVENOUS Stopped (05/03/15 0700)    Principal Problem:   Gastrointestinal hemorrhage associated with duodenal ulcer Active Problems:   Hemorrhagic shock   HTN (hypertension)   Diabetes mellitus without complication (HCC)   Chronic kidney disease   Upper GI bleed   Acute respiratory failure (HCC)   Metabolic acidosis   Acute respiratory failure with hypoxia (HCC)   Elevated troponin   Acute kidney injury superimposed on CKD ([place])   Leukocytosis   Acute blood loss  anemia   Thyroid activity decreased   Hypernatremia   Hypoxia   E-coli UTI    Time spent: 45 mins     [facility] MD [facility] Pager [phone]. If 7PM-7AM, please contact night-coverage at [email or web address], password [email or web address] 05/06/2015, 9:55 AM  LOS: 7 days

## 2015-05-06 NOTE — Progress Notes (Addendum)
Pt transferred from 2S.  She was oriented to room and educated on equipment.  She was educated on [employer] and signed, at bedside.  Yellow armband and yellow socks placed.  Bed alarm set.  Spoke to Martha at [facility] to notify of pt's room transfer, and telemetry box info.  Cari, NT was second verifier to CCMD.  VSS.  Standing weight obtained on Scale A ( rooms 1-10) and is documented in Doc Flowsheets.  Pt had no further questions at this time.

## 2015-05-06 NOTE — Progress Notes (Signed)
Patient Name: Sophia [name] Date of Encounter: 05/06/2015     Principal Problem:   Gastrointestinal hemorrhage associated with duodenal ulcer Active Problems:   HTN (hypertension)   Diabetes mellitus without complication (HCC)   Chronic kidney disease   Upper GI bleed   Acute respiratory failure (HCC)   Hemorrhagic shock   Metabolic acidosis   Acute respiratory failure with hypoxia (HCC)   Elevated troponin   Acute kidney injury superimposed on CKD (HCC)   Leukocytosis   Acute blood loss anemia   Thyroid activity decreased   Hypernatremia   Hypoxia   E-coli UTI    SUBJECTIVE  The patient feels well.  Denies chest pain.  Minimal dyspnea.  Rhythm is normal sinus rhythm  CURRENT MEDS . sodium chloride   Intravenous Once  . antiseptic oral rinse  7 mL Mouth Rinse BID  . carvedilol  3.125 mg Oral BID WC  . cefTRIAXone (ROCEPHIN)  IV  1 g Intravenous Q24H  . insulin aspart  0-9 Units Subcutaneous TID WC  . levothyroxine  37.5 mcg Oral QAC breakfast  . magnesium sulfate 1 - 4 g bolus IVPB  4 g Intravenous Once  . pantoprazole  40 mg Oral BID AC    OBJECTIVE  Filed Vitals:   05/06/15 0000 05/06/15 0100 05/06/15 0400 05/06/15 0727  BP: 130/63 117/64 119/60 132/62  Pulse: 64 62 59 78  Temp:  98.5 F (36.9 C)  97.9 F (36.6 C)  TempSrc:  Axillary  Oral  Resp: 22 17 17    Height:      Weight:      SpO2: 100% 98% 97% 97%    Intake/Output Summary (Last 24 hours) at 05/06/15 1011 Last data filed at 05/06/15 0906  Gross per 24 hour  Intake    920 ml  Output    750 ml  Net    170 ml   Filed Weights   05/03/15 0500 05/04/15 0447 05/05/15 0425  Weight: 170 lb 6.7 oz (77.3 kg) 164 lb 10.9 oz (74.7 kg) 164 lb 0.4 oz (74.4 kg)    PHYSICAL EXAM  General: Pleasant, NAD.  Nasal oxygen in place.  Mild pallor. Neuro: Alert and oriented X 3. Moves all extremities spontaneously. Psych: Normal affect. HEENT:  Normal  Neck: Supple without bruits or JVD. Lungs:   Resp regular and unlabored, CTA. Heart: RRR no s3, s4, or murmurs. Abdomen: Soft, non-tender, non-distended, BS + x 4.  Extremities: No clubbing, cyanosis or edema. DP/PT/Radials 2+ and equal bilaterally.  Accessory Clinical Findings  CBC  Recent Labs  05/04/15 0454  05/05/15 0320 05/06/15 0450  WBC 14.6*  --  13.7* 10.9*  NEUTROABS 11.4*  --  10.4*  --   HGB 9.4*  < > 9.7* 9.4*  HCT 27.8*  < > 28.9* 29.6*  MCV 84.2  --  85.8 88.1  PLT 98*  --  125* 162  < > = values in this interval not displayed. Basic Metabolic Panel  Recent Labs  05/05/15 0320 05/06/15 0450  NA 145 144  K 3.5 3.3*  CL 116* 111  CO2 25 27  GLUCOSE 116* 101*  BUN 39* 31*  CREATININE 1.94* 1.67*  CALCIUM 7.4* 7.4*  MG 1.9 1.7   Liver Function Tests No results for input(s): AST, ALT, ALKPHOS, BILITOT, PROT, ALBUMIN in the last 72 hours. No results for input(s): LIPASE, AMYLASE in the last 72 hours. Cardiac Enzymes No results for input(s): CKTOTAL, CKMB, CKMBINDEX, TROPONINI in the last  72 hours. BNP Invalid input(s): POCBNP D-Dimer No results for input(s): DDIMER in the last 72 hours. Hemoglobin A1C  Recent Labs  05/03/15 1425  HGBA1C 6.0*   Fasting Lipid Panel No results for input(s): CHOL, HDL, LDLCALC, TRIG, CHOLHDL, LDLDIRECT in the last 72 hours. Thyroid Function Tests  Recent Labs  05/03/15 1425  TSH 6.292*    TELE  Normal sinus rhythm  ECG    Radiology/Studies  Ir Angiogram Visceral Selective  04/30/2015  CLINICAL DATA:  Large volume upper GI bleed. Endoscopy revealed probable duodenal ulcer has active source, incompletely controlled. EXAM: IR EMBO ART VEN HEMORR LYMPH EXTRAV INC GUIDE ROADMAPPING; IR ULTRASOUND GUIDANCE VASC ACCESS LEFT; ARTERIOGRAPHY; SELECTIVE VISCERAL ARTERIOGRAPHY; ADDITIONAL ARTERIOGRAPHY ANESTHESIA/SEDATION: Intravenous Fentanyl and Versed had been administered for previous procedure. No additional medications were administered. Radiology RN  provided continuous cardiorespiratory monitoring. MEDICATIONS: Lidocaine 1% subcutaneous 5 mL CONTRAST:  56mL OMNIPAQUE IOHEXOL 300 MG/ML  SOLN PROCEDURE: The procedure, risks (including but not limited to bleeding, infection, organ damage ), benefits, and alternatives were explained to the spouse and daughter. Questions regarding the procedure were encouraged and answered. The family understands and consents to the procedure. Because of a indwelling right femoral venous catheter, a left-sided femoral approach was selected. Site was marked, prepped with Betadine, draped in usual sterile fashion, infiltrated locally with 1% lidocaine. Under real-time ultrasound guidance, the left femoral artery was accessed with a 21-gauge micropuncture needle, exchanged over a 018 guidewire for a transitional dilator, through which a 035 guidewire was advanced. A 5 French vascular sheath was placed. Through this a 5 French C2 catheter was coaxial advanced in used to selectively catheterize the celiac axis for selective arteriography. An angled Glidewire was advanced into the gastroduodenal artery but the C2 catheter could not be adequately advanced distally. A coaxial renegade micro catheter with transient guidewire was then advanced through the C2 and used to selectively catheterize the gastroduodenal artery. Coil embolization of the GDA performed with 3-5 mm coils the cessation of flow. After final arteriography through the C2 catheter, the catheter and sheath were removed and hemostasis achieved with the aid of the Exoseal device after confirmatory femoral arteriography. The patient tolerated the procedure well. COMPLICATIONS: None immediate FINDINGS: Brisk bleeding from a distal branch of the gastroduodenal artery was evident on the initial arteriogram, into what is probably the lumen of the duodenum. The vessel was selectively catheterized and coil embolization performed until cessation of flow through the treated segment and  no further extravasation evident. There is elsewhere diffuse arterial vasospasm. No other pathologic regions identified. IMPRESSION: 1. Brisk active arterial extravasation from a branch of the gastroduodenal artery probably into the duodenal lumen. 2. Technically successful coil embolization of the gastroduodenal artery with cessation of extravasation. Electronically Signed   By: Lucrezia Europe M.D.   On: 04/30/2015 12:08   Ir Angiogram Selective Each Additional Vessel  04/30/2015  CLINICAL DATA:  Large volume upper GI bleed. Endoscopy revealed probable duodenal ulcer has active source, incompletely controlled. EXAM: IR EMBO ART VEN HEMORR LYMPH EXTRAV INC GUIDE ROADMAPPING; IR ULTRASOUND GUIDANCE VASC ACCESS LEFT; ARTERIOGRAPHY; SELECTIVE VISCERAL ARTERIOGRAPHY; ADDITIONAL ARTERIOGRAPHY ANESTHESIA/SEDATION: Intravenous Fentanyl and Versed had been administered for previous procedure. No additional medications were administered. Radiology RN provided continuous cardiorespiratory monitoring. MEDICATIONS: Lidocaine 1% subcutaneous 5 mL CONTRAST:  43mL OMNIPAQUE IOHEXOL 300 MG/ML  SOLN PROCEDURE: The procedure, risks (including but not limited to bleeding, infection, organ damage ), benefits, and alternatives were explained to the spouse and daughter. Questions  regarding the procedure were encouraged and answered. The family understands and consents to the procedure. Because of a indwelling right femoral venous catheter, a left-sided femoral approach was selected. Site was marked, prepped with Betadine, draped in usual sterile fashion, infiltrated locally with 1% lidocaine. Under real-time ultrasound guidance, the left femoral artery was accessed with a 21-gauge micropuncture needle, exchanged over a 018 guidewire for a transitional dilator, through which a 035 guidewire was advanced. A 5 French vascular sheath was placed. Through this a 5 French C2 catheter was coaxial advanced in used to selectively catheterize the  celiac axis for selective arteriography. An angled Glidewire was advanced into the gastroduodenal artery but the C2 catheter could not be adequately advanced distally. A coaxial renegade micro catheter with transient guidewire was then advanced through the C2 and used to selectively catheterize the gastroduodenal artery. Coil embolization of the GDA performed with 3-5 mm coils the cessation of flow. After final arteriography through the C2 catheter, the catheter and sheath were removed and hemostasis achieved with the aid of the Exoseal device after confirmatory femoral arteriography. The patient tolerated the procedure well. COMPLICATIONS: None immediate FINDINGS: Brisk bleeding from a distal branch of the gastroduodenal artery was evident on the initial arteriogram, into what is probably the lumen of the duodenum. The vessel was selectively catheterized and coil embolization performed until cessation of flow through the treated segment and no further extravasation evident. There is elsewhere diffuse arterial vasospasm. No other pathologic regions identified. IMPRESSION: 1. Brisk active arterial extravasation from a branch of the gastroduodenal artery probably into the duodenal lumen. 2. Technically successful coil embolization of the gastroduodenal artery with cessation of extravasation. Electronically Signed   By: Lucrezia Europe M.D.   On: 04/30/2015 12:08   Ir Angiogram Follow Up Study  04/30/2015  CLINICAL DATA:  Large volume upper GI bleed. Endoscopy revealed probable duodenal ulcer has active source, incompletely controlled. EXAM: IR EMBO ART VEN HEMORR LYMPH EXTRAV INC GUIDE ROADMAPPING; IR ULTRASOUND GUIDANCE VASC ACCESS LEFT; ARTERIOGRAPHY; SELECTIVE VISCERAL ARTERIOGRAPHY; ADDITIONAL ARTERIOGRAPHY ANESTHESIA/SEDATION: Intravenous Fentanyl and Versed had been administered for previous procedure. No additional medications were administered. Radiology RN provided continuous cardiorespiratory monitoring.  MEDICATIONS: Lidocaine 1% subcutaneous 5 mL CONTRAST:  36mL OMNIPAQUE IOHEXOL 300 MG/ML  SOLN PROCEDURE: The procedure, risks (including but not limited to bleeding, infection, organ damage ), benefits, and alternatives were explained to the spouse and daughter. Questions regarding the procedure were encouraged and answered. The family understands and consents to the procedure. Because of a indwelling right femoral venous catheter, a left-sided femoral approach was selected. Site was marked, prepped with Betadine, draped in usual sterile fashion, infiltrated locally with 1% lidocaine. Under real-time ultrasound guidance, the left femoral artery was accessed with a 21-gauge micropuncture needle, exchanged over a 018 guidewire for a transitional dilator, through which a 035 guidewire was advanced. A 5 French vascular sheath was placed. Through this a 5 French C2 catheter was coaxial advanced in used to selectively catheterize the celiac axis for selective arteriography. An angled Glidewire was advanced into the gastroduodenal artery but the C2 catheter could not be adequately advanced distally. A coaxial renegade micro catheter with transient guidewire was then advanced through the C2 and used to selectively catheterize the gastroduodenal artery. Coil embolization of the GDA performed with 3-5 mm coils the cessation of flow. After final arteriography through the C2 catheter, the catheter and sheath were removed and hemostasis achieved with the aid of the Exoseal device after confirmatory femoral arteriography.  The patient tolerated the procedure well. COMPLICATIONS: None immediate FINDINGS: Brisk bleeding from a distal branch of the gastroduodenal artery was evident on the initial arteriogram, into what is probably the lumen of the duodenum. The vessel was selectively catheterized and coil embolization performed until cessation of flow through the treated segment and no further extravasation evident. There is  elsewhere diffuse arterial vasospasm. No other pathologic regions identified. IMPRESSION: 1. Brisk active arterial extravasation from a branch of the gastroduodenal artery probably into the duodenal lumen. 2. Technically successful coil embolization of the gastroduodenal artery with cessation of extravasation. Electronically Signed   By: Lucrezia Europe M.D.   On: 04/30/2015 12:08   Ir US Guide Vasc Access Left  04/30/2015  CLINICAL DATA:  Large volume upper GI bleed. Endoscopy revealed probable duodenal ulcer has active source, incompletely controlled. EXAM: IR EMBO ART VEN HEMORR LYMPH EXTRAV INC GUIDE ROADMAPPING; IR ULTRASOUND GUIDANCE VASC ACCESS LEFT; ARTERIOGRAPHY; SELECTIVE VISCERAL ARTERIOGRAPHY; ADDITIONAL ARTERIOGRAPHY ANESTHESIA/SEDATION: Intravenous Fentanyl and Versed had been administered for previous procedure. No additional medications were administered. Radiology RN provided continuous cardiorespiratory monitoring. MEDICATIONS: Lidocaine 1% subcutaneous 5 mL CONTRAST:  78mL OMNIPAQUE IOHEXOL 300 MG/ML  SOLN PROCEDURE: The procedure, risks (including but not limited to bleeding, infection, organ damage ), benefits, and alternatives were explained to the spouse and daughter. Questions regarding the procedure were encouraged and answered. The family understands and consents to the procedure. Because of a indwelling right femoral venous catheter, a left-sided femoral approach was selected. Site was marked, prepped with Betadine, draped in usual sterile fashion, infiltrated locally with 1% lidocaine. Under real-time ultrasound guidance, the left femoral artery was accessed with a 21-gauge micropuncture needle, exchanged over a 018 guidewire for a transitional dilator, through which a 035 guidewire was advanced. A 5 French vascular sheath was placed. Through this a 5 French C2 catheter was coaxial advanced in used to selectively catheterize the celiac axis for selective arteriography. An angled Glidewire  was advanced into the gastroduodenal artery but the C2 catheter could not be adequately advanced distally. A coaxial renegade micro catheter with transient guidewire was then advanced through the C2 and used to selectively catheterize the gastroduodenal artery. Coil embolization of the GDA performed with 3-5 mm coils the cessation of flow. After final arteriography through the C2 catheter, the catheter and sheath were removed and hemostasis achieved with the aid of the Exoseal device after confirmatory femoral arteriography. The patient tolerated the procedure well. COMPLICATIONS: None immediate FINDINGS: Brisk bleeding from a distal branch of the gastroduodenal artery was evident on the initial arteriogram, into what is probably the lumen of the duodenum. The vessel was selectively catheterized and coil embolization performed until cessation of flow through the treated segment and no further extravasation evident. There is elsewhere diffuse arterial vasospasm. No other pathologic regions identified. IMPRESSION: 1. Brisk active arterial extravasation from a branch of the gastroduodenal artery probably into the duodenal lumen. 2. Technically successful coil embolization of the gastroduodenal artery with cessation of extravasation. Electronically Signed   By: Lucrezia Europe M.D.   On: 04/30/2015 12:08   Dg Chest Port 1 View  05/04/2015  CLINICAL DATA:  Hypoxia EXAM: PORTABLE CHEST - 1 VIEW COMPARISON:  05/03/2015 FINDINGS: Cardiac shadow is stable. Persistent left pleural effusion and basilar infiltrate are seen. Left jugular central line is again noted in the proximal superior vena cava. IMPRESSION: Persistent left basilar changes.  No new focal abnormality is seen. Electronically Signed   By: Linus Mako.D.  On: 05/04/2015 11:48   Dg Chest Port 1 View  05/03/2015  CLINICAL DATA:  Respiratory failure EXAM: PORTABLE CHEST 1 VIEW COMPARISON:  Portable chest x-ray of May 01, 2015 FINDINGS: The lungs are  adequately inflated. There is persistent basilar atelectasis or pneumonia and small pleural effusion. The heart is top-normal in size. The pulmonary vascularity is mildly prominent centrally. There is a left internal jugular venous catheter in place whose tip projects over the midportion of the SVC. The trachea and esophagus have been extubated. IMPRESSION: Stable appearance of the chest postextubation. Persistent left lower lobe atelectasis -pneumonia with small left pleural effusion. Electronically Signed   By: David  Martinique M.D.   On: 05/03/2015 07:36   Dg Chest Port 1 View  05/01/2015  CLINICAL DATA:  Respiratory failure EXAM: PORTABLE CHEST 1 VIEW COMPARISON:  05/01/2015 FINDINGS: Stable endotracheal and NG tube position. There is left IJ central line with tip in SVC. No pneumothorax. Persistent small left pleural effusion and left basilar atelectasis or infiltrate. No pulmonary edema. IMPRESSION: Stable support apparatus. Left IJ central line in place. Persistent small left pleural effusion with left basilar atelectasis or infiltrate. Electronically Signed   By: Lahoma Crocker M.D.   On: 05/01/2015 14:28   Dg Chest Port 1 View  05/01/2015  CLINICAL DATA:  Endotracheal tube placement EXAM: PORTABLE CHEST 1 VIEW COMPARISON:  04/29/2015 FINDINGS: Grossly unchanged enlarged cardiac silhouette and mediastinal contours with atherosclerotic plaque within the thoracic aorta. Endotracheal tube overlies tracheal air column with tip approximately 1.7 cm above the carina. Enteric tube tip and side port project below the left hemidiaphragm. Worsening bibasilar heterogeneous/consolidative opacities, left greater than right. No definite pleural effusion. No pneumothorax. No definite evidence of edema. Unchanged bones. IMPRESSION: 1. Appropriately positioned support apparatus as above. No pneumothorax. 2. Worsening bibasilar heterogeneous opacities, left greater than right, atelectasis versus infiltrate. 3. No evidence of  edema. Electronically Signed   By: Sandi Mariscal M.D.   On: 05/01/2015 07:22   Dg Chest Port 1 View  04/29/2015  CLINICAL DATA:  Post intubation. EXAM: PORTABLE CHEST 1 VIEW COMPARISON:  04/29/2015 FINDINGS: Interval placement of an endotracheal tube with tip measuring 2.9 cm above the carina. Shallow inspiration. Normal heart size and pulmonary vascularity. Lungs appear clear. No blunting of costophrenic angles. No pneumothorax. Tortuous aorta. Surgical clips in the right upper quadrant. IMPRESSION: Endotracheal tube tip measures 2.9 cm above the carina. Shallow inspiration. No evidence of active pulmonary disease. Electronically Signed   By: Lucienne Capers M.D.   On: 04/29/2015 22:52   Dg Abd Portable 1v  04/29/2015  CLINICAL DATA:  81 year old female with bloody stool and vomiting blood EXAM: PORTABLE ABDOMEN - 1 VIEW COMPARISON:  None. FINDINGS: Single upright radiograph demonstrates gas within the stomach. No free air identified. External defibrillator pads project over the left chest and right upper quadrant. The heart is likely mildly enlarged. The lungs appear clear. No acute osseous abnormality. IMPRESSION: 1. No free air visualized. 2. Mild cardiomegaly. 3. The lungs appear clear. Electronically Signed   By: Jacqulynn Cadet M.D.   On: 04/29/2015 21:43   [place]ath Guide Roadmapping  04/30/2015  CLINICAL DATA:  Large volume upper GI bleed. Endoscopy revealed probable duodenal ulcer has active source, incompletely controlled. EXAM: IR EMBO ART VEN HEMORR LYMPH EXTRAV INC GUIDE ROADMAPPING; IR ULTRASOUND GUIDANCE VASC ACCESS LEFT; ARTERIOGRAPHY; SELECTIVE VISCERAL ARTERIOGRAPHY; ADDITIONAL ARTERIOGRAPHY ANESTHESIA/SEDATION: Intravenous Fentanyl and Versed had been administered for  previous procedure. No additional medications were administered. Radiology RN provided continuous cardiorespiratory monitoring. MEDICATIONS: Lidocaine 1% subcutaneous 5 mL CONTRAST:  1mL  OMNIPAQUE IOHEXOL 300 MG/ML  SOLN PROCEDURE: The procedure, risks (including but not limited to bleeding, infection, organ damage ), benefits, and alternatives were explained to the spouse and daughter. Questions regarding the procedure were encouraged and answered. The family understands and consents to the procedure. Because of a indwelling right femoral venous catheter, a left-sided femoral approach was selected. Site was marked, prepped with Betadine, draped in usual sterile fashion, infiltrated locally with 1% lidocaine. Under real-time ultrasound guidance, the left femoral artery was accessed with a 21-gauge micropuncture needle, exchanged over a 018 guidewire for a transitional dilator, through which a 035 guidewire was advanced. A 5 French vascular sheath was placed. Through this a 5 French C2 catheter was coaxial advanced in used to selectively catheterize the celiac axis for selective arteriography. An angled Glidewire was advanced into the gastroduodenal artery but the C2 catheter could not be adequately advanced distally. A coaxial renegade micro catheter with transient guidewire was then advanced through the C2 and used to selectively catheterize the gastroduodenal artery. Coil embolization of the GDA performed with 3-5 mm coils the cessation of flow. After final arteriography through the C2 catheter, the catheter and sheath were removed and hemostasis achieved with the aid of the Exoseal device after confirmatory femoral arteriography. The patient tolerated the procedure well. COMPLICATIONS: None immediate FINDINGS: Brisk bleeding from a distal branch of the gastroduodenal artery was evident on the initial arteriogram, into what is probably the lumen of the duodenum. The vessel was selectively catheterized and coil embolization performed until cessation of flow through the treated segment and no further extravasation evident. There is elsewhere diffuse arterial vasospasm. No other pathologic regions  identified. IMPRESSION: 1. Brisk active arterial extravasation from a branch of the gastroduodenal artery probably into the duodenal lumen. 2. Technically successful coil embolization of the gastroduodenal artery with cessation of extravasation. Electronically Signed   By: Lucrezia Europe M.D.   On: 04/30/2015 12:08    ASSESSMENT AND PLAN  1. Demand Ischemia in the setting of blood loss anemia: Troponin is trending down. She is asymptomatic. Echo demonstrated normal EF without WMA.  We will plan for an outpatient stress Myoview once she has stabilized from her anemia and her GI bleed.    2. Bradycardia: Stable.  To start low dose carvedilol today   3. Hypotension: Stable. No antihypertensives at this time. Only using prn lasix.  No evidence of significant fluid overload on exam today  4. GI Bleed: S/P IR embolization of gastroduodenal artery. She has received 11 units of PRBC's and 3 units of FFP. Hgb relatively stable.  5. Hypokalemia: Repletion per primary service  Signed, Warren Danes MD

## 2015-05-06 NOTE — Evaluation (Signed)
Occupational Therapy Evaluation Patient Details Name: Sophia [name] MRN: [id number] DOB: 1931/09/28 Today's Date: 05/06/2015    History of Present Illness She was brought to [facility] ED 12/12 due to near syncopal episode along with melena. She did fall and suffered a laceration to her forehead. Found to have GI bleed   Clinical Impression   This 81 yo female admitted with above presents to acute OT with decreased balance and generalized weakness affecting her ability to care for herself and help take care of her husband. She will benefit from acute OT with follow up at SNF to get back to an independent level.    Follow Up Recommendations  SNF    Equipment Recommendations  None recommended by OT       Precautions / Restrictions Precautions Precautions: Fall Restrictions Weight Bearing Restrictions: No      Mobility Bed Mobility               General bed mobility comments: Pt up in recliner upon arrival  Transfers Overall transfer level: Needs assistance Equipment used: Rolling walker (2 wheeled) Transfers: Sit to/from Stand Sit to Stand: Min assist              Balance Overall balance assessment: Needs assistance Sitting-balance support: Feet supported;No upper extremity supported Sitting balance-Leahy Scale: Good     Standing balance support: Bilateral upper extremity supported Standing balance-Leahy Scale: Poor Standing balance comment: reliant on RW or HHA                            ADL Overall ADL's : Needs assistance/impaired Eating/Feeding: Independent;Sitting   Grooming: Set up;Sitting   Upper Body Bathing: Set up;Sitting   Lower Body Bathing: Minimal assistance;Sit to/from stand   Upper Body Dressing : Set up;Sitting   Lower Body Dressing: Minimal assistance;Sit to/from stand   Toilet Transfer: Minimal assistance;Ambulation;Regular Toilet;Grab bars;RW   Toileting- Clothing Manipulation and Hygiene: Minimal assistance;Sit  to/from stand                         Pertinent Vitals/Pain Pain Assessment: No/denies pain     Hand Dominance Right   Extremity/Trunk Assessment Upper Extremity Assessment Upper Extremity Assessment: Overall WFL for tasks assessed   Lower Extremity Assessment Lower Extremity Assessment: Defer to PT evaluation       Communication Communication Communication: No difficulties   Cognition Arousal/Alertness: Awake/alert Behavior During Therapy: WFL for tasks assessed/performed Overall Cognitive Status: Within Functional Limits for tasks assessed                                Home Living Family/patient expects to be discharged to:: Private residence Living Arrangements: Spouse/significant other Available Help at Discharge: Family;Available 24 hours/day (but cannot physically A pt) Type of Home: House Home Access: Stairs to enter [employer] of Steps: 2 Entrance Stairs-Rails: Right;Left;Can reach both Home Layout: One level     Bathroom Shower/Tub: Walk-in shower;Door   [employer] Toilet: Standard     Home Equipment: None          Prior Functioning/Environment Level of Independence: Independent             OT Diagnosis: Generalized weakness   OT Problem List: Decreased strength;Impaired balance (sitting and/or standing)   OT Treatment/Interventions: Self-care/ADL training;Patient/family education;Balance training;DME and/or AE instruction    OT Goals(Current goals can be found  in the care plan section) Acute Rehab OT Goals Patient Stated Goal: to get stronger before going home so I can take care of myself and the cooking/cleaning since my husband is not in good health OT Goal Formulation: With patient Time For Goal Achievement: 05/13/15 Potential to Achieve Goals: Good  OT Frequency: Min 2X/week   Barriers to D/C: Decreased caregiver support          Co-evaluation PT/OT/SLP Co-Evaluation/Treatment: Yes     OT goals  addressed during session: ADL's and self-care;Strengthening/ROM      End of Session Equipment Utilized During Treatment: Gait belt;Rolling walker Nurse Communication:  (left pt on RA)  Activity Tolerance: Patient tolerated treatment well Patient left: in chair;with call bell/phone within reach   Time: [id number] OT Time Calculation (min): 23 min Charges:  OT General Charges $OT Visit: 1 Procedure OT Evaluation $Initial OT Evaluation Tier I: 1 Procedure  [name] [id number] 05/06/2015, 10:53 AM

## 2015-05-06 NOTE — Care Management Important Message (Signed)
Important Message  Patient Details  Name: Sophia [name] MRN: [id number] Date of Birth: [birth year]   Medicare Important Message Given:  Yes    Yara Tomkinson Abena 05/06/2015, 1:21 PM

## 2015-05-06 NOTE — Evaluation (Signed)
Physical Therapy Evaluation Patient Details Name: Sophia [name] MRN: [id number] DOB: 10-May-1932 Today's Date: 05/06/2015   History of Present Illness  She was brought to [facility] ED 12/12 due to near syncopal episode along with melena. She did fall and suffered a laceration to her forehead. Found to have GI bleed  Clinical Impression  Pt admitted with above diagnosis. Pt currently with functional limitations due to the deficits listed below (see PT Problem List). Pt ambulated with RW with occasional cues for balance and safety.  Pt needed min assist to ambulate without device.  Pt upset that she is as weak as she is.  Lives with husband who cannot assist her at d/c.  Pt agrees to go to SNF prior to d/c home for therapy to return to I level.  Will follow acutely.  Pt will benefit from skilled PT to increase their independence and safety with mobility to allow discharge to the venue listed below.      Follow Up Recommendations SNF;Supervision/Assistance - 24 hour    Equipment Recommendations  Rolling walker with 5" wheels    Recommendations for Other Services       Precautions / Restrictions Precautions Precautions: Fall Restrictions Weight Bearing Restrictions: No      Mobility  Bed Mobility               General bed mobility comments: Pt up in recliner upon arrival  Transfers Overall transfer level: Needs assistance Equipment used: Rolling walker (2 wheeled) Transfers: Sit to/from Stand Sit to Stand: Min assist         General transfer comment: slightly unsteady without device needing min assist.  Pt with posterior lean and stepping to keep balance.    Ambulation/Gait Ambulation/Gait assistance: Min guard;+2 safety/equipment Ambulation Distance (Feet): 200 Feet Assistive device: Rolling walker (2 wheeled) Gait Pattern/deviations: Step-through pattern;Decreased stride length   Gait velocity interpretation: Below normal speed for age/gender General Gait  Details: Pt unsteady taking a few steps without RW needing min assist.  Obtained RW and pt able to ambulate well with it needing occasional cues for sequence steps and RW and to steer RW.    Stairs            Wheelchair Mobility    Modified Rankin (Stroke Patients Only)       Balance Overall balance assessment: Needs assistance Sitting-balance support: No upper extremity supported;Feet supported Sitting balance-Leahy Scale: Good     Standing balance support: Bilateral upper extremity supported;During functional activity Standing balance-Leahy Scale: Poor Standing balance comment: reliant on RW or HHA.             High level balance activites: Turns;Sudden stops;Direction changes High Level Balance Comments: requires RW with min guard assist or min assist without device.              Pertinent Vitals/Pain Pain Assessment: No/denies pain  Hr 82-199 bpm, O2 98% on 2LO2.  99% on RA.      Home Living Family/patient expects to be discharged to:: Private residence Living Arrangements: Spouse/significant other Available Help at Discharge: Family;Available 24 hours/day (but cannot physically A pt) Type of Home: House Home Access: Stairs to enter Entrance Stairs-Rails: Right;Left;Can reach both Entrance Stairs-Number of Steps: 2 Home Layout: One level Home Equipment: None      Prior Function Level of Independence: Independent               Hand Dominance   Dominant Hand: Right    Extremity/Trunk Assessment  Upper Extremity Assessment: Defer to OT evaluation           Lower Extremity Assessment: Overall WFL for tasks assessed         Communication   Communication: No difficulties  Cognition Arousal/Alertness: Awake/alert Behavior During Therapy: WFL for tasks assessed/performed Overall Cognitive Status: Within Functional Limits for tasks assessed                      General Comments      Exercises General Exercises - Lower  Extremity Ankle Circles/Pumps: AROM;Both;10 reps;Seated Long Arc Quad: AROM;Both;10 reps;Seated Hip Flexion/Marching: AROM;Both;10 reps;Seated      Assessment/Plan    PT Assessment Patient needs continued PT services  PT Diagnosis Generalized weakness   PT Problem List Decreased activity tolerance;Decreased balance;Decreased mobility;Decreased knowledge of use of DME;Decreased safety awareness;Decreased knowledge of precautions  PT Treatment Interventions DME instruction;Gait training;Functional mobility training;Therapeutic activities;Therapeutic exercise;Balance training;Patient/family education   PT Goals (Current goals can be found in the Care Plan section) Acute Rehab PT Goals Patient Stated Goal: to get stronger before going home so I can take care of myself and the cooking/cleaning since my husband is not in good health PT Goal Formulation: With patient Time For Goal Achievement: 05/20/15 Potential to Achieve Goals: Good    Frequency Min 3X/week   Barriers to discharge Decreased caregiver support      Co-evaluation PT/OT/SLP Co-Evaluation/Treatment: Yes Reason for Co-Treatment: For patient/therapist safety PT goals addressed during session: Mobility/safety with mobility         End of Session Equipment Utilized During Treatment: Gait belt Activity Tolerance: Patient tolerated treatment well Patient left: in chair;with call bell/phone within reach;with chair alarm set Nurse Communication: Mobility status         Time: [id number] PT Time Calculation (min) (ACUTE ONLY): 23 min   Charges:   PT Evaluation $Initial PT Evaluation Tier I: 1 Procedure     PT G CodesDenice Paradise May 27, 2015, 3:45 PM Terisha Losasso [facility] Acute Rehabilitation [phone] (pager)

## 2015-05-07 LAB — BASIC METABOLIC PANEL
Anion gap: 8 (ref 5–15)
BUN: 23 mg/dL — ABNORMAL HIGH (ref 6–20)
CO2: 25 mmol/L (ref 22–32)
Calcium: 7.7 mg/dL — ABNORMAL LOW (ref 8.9–10.3)
Chloride: 109 mmol/L (ref 101–111)
Creatinine, Ser: 1.57 mg/dL — ABNORMAL HIGH (ref 0.44–1.00)
GFR calc Af Amer: 35 mL/min — ABNORMAL LOW (ref >60)
GFR calc non Af Amer: 30 mL/min — ABNORMAL LOW (ref >60)
Glucose, Bld: 107 mg/dL — ABNORMAL HIGH (ref 65–99)
Potassium: 3.5 mmol/L (ref 3.5–5.1)
Sodium: 142 mmol/L (ref 135–145)

## 2015-05-07 LAB — GLUCOSE, CAPILLARY
Glucose-Capillary: 101 mg/dL — ABNORMAL HIGH (ref 65–99)
Glucose-Capillary: 139 mg/dL — ABNORMAL HIGH (ref 65–99)
Glucose-Capillary: 157 mg/dL — ABNORMAL HIGH (ref 65–99)
Glucose-Capillary: 105 mg/dL — ABNORMAL HIGH (ref 65–99)

## 2015-05-07 LAB — CBC WITH DIFFERENTIAL/PLATELET
Basophils Absolute: 0 10*3/uL (ref 0.0–0.1)
Basophils Relative: 0 %
Eosinophils Absolute: 0.6 10*3/uL (ref 0.0–0.7)
Eosinophils Relative: 5 %
HCT: 30.4 % — ABNORMAL LOW (ref 36.0–46.0)
Hemoglobin: 9.9 g/dL — ABNORMAL LOW (ref 12.0–15.0)
Lymphocytes Relative: 11 %
Lymphs Abs: 1.1 10*3/uL (ref 0.7–4.0)
MCH: 28.7 pg (ref 26.0–34.0)
MCHC: 32.6 g/dL (ref 30.0–36.0)
MCV: 88.1 fL (ref 78.0–100.0)
Monocytes Absolute: 0.9 10*3/uL (ref 0.1–1.0)
Monocytes Relative: 9 %
Neutro Abs: 7.7 10*3/uL (ref 1.7–7.7)
Neutrophils Relative %: 75 %
Platelets: 224 10*3/uL (ref 150–400)
RBC: 3.45 MIL/uL — ABNORMAL LOW (ref 3.87–5.11)
RDW: 17.9 % — ABNORMAL HIGH (ref 11.5–15.5)
WBC: 10.3 10*3/uL (ref 4.0–10.5)

## 2015-05-07 LAB — MAGNESIUM: Magnesium: 2 mg/dL (ref 1.7–2.4)

## 2015-05-07 MED ORDER — CEFUROXIME AXETIL 500 MG PO TABS
500.0000 mg | ORAL_TABLET | Freq: Two times a day (BID) | ORAL | Status: DC
  Administered 2015-05-08: 500 mg via ORAL
  Filled 2015-05-07: qty 1

## 2015-05-07 MED ORDER — SODIUM CHLORIDE 0.9 % IJ SOLN: 10.0000 mL | INTRAMUSCULAR | Status: DC | PRN

## 2015-05-07 NOTE — Progress Notes (Signed)
[facility] PROGRESS NOTE  Sophia [name] [id number] DOB: Sep 21, 1931 DOA: 04/29/2015 PCP: Claretta Fraise, MD  Brief interval history Sophia [name] Naval is an 81 y.o. F with PMH as outlined below. Sophia [name] was brought to [facility] ED 12/12 due to near syncopal episode along with melena. Sophia [name] did fall and suffered a laceration to her forehead.   On arrival to ED, Sophia [name] was apparently covered in melena and blood. Once in ED stretcher, Sophia [name] began to have active hematemesis. SBP was in the 70's and Hgb 6.5 (was 11.0 just 3 days prior). Sophia [name] was given 4L IVF and had 4u PRBC ordered. GI was called and planning for bedside endoscopy in ED. Patient was admitted to ICU under PCCM intubated for airway protection. Patient subsequently placed on pressors and had a bedside upper endoscopy that showed a duodenal ulcer that was still gushing out blood. Patient received a total of 11 units packed red blood cells 3 units FFP and a unit of pheresis platelets. Patient continued to bleed general surgery was consulted patient underwent IR embolization of gastroduodenal artery Sophia Dr. Jarvis Newcomer 04/30/2015. Patient stabilized improved transfer to the step down unit. Patient has remained stable and improved clinically. Patient also noted to have a demand ischemia being followed by cardiology and needs outpatient stress test. Patient also noted to have a Escherichia coli UTI place on IV antibiotics and transitioned to oral antibiotics. Patient improving clinically likely needs SNF versus CIR.    Assessment/Plan: #1 upper GI bleed secondary to duodenal ulcer and gastroduodenal artery status post upper endoscopy 04/29/2015 showing a duodenal ulcer. Patient with continuous bleeding initially and status post IR embolization of gastroduodenal artery. Dr. Jarvis Newcomer 04/30/2015. Patient is status post 11 units packed red blood cells since admission, 3 units FFP, 1 unit for pheresed platelets. Hemoglobin still trended  down and currently at 9.9 from 9.4 from 9.7 from 9.4 from 6.7. Blood pressure has improved. Patient with brown stool yesterday morning and had some tarry stools 2-3 days ago.  Hemoglobin seems to have stabilized at 9.9. Patient alert and following commands. Patient denies any abdominal pain. Continue oral Protonix twice daily. Follow H&H. Transfusion threshold hemoglobin less than 7. Tolerating soft diet. Sophia general surgery if patient was to become hemodynamically unstable with continued bleeding may require surgery however will treat conservatively for now and follow. General surgery and GI have signed off. Patient will likely need to follow-up with GI as an outpatient post discharge in 3-4 weeks.   #2 acute blood loss anemia Secondary to problem #1. Status post 11 units packed red blood cells. Hemoglobin currently at  9.9 from 9.4 from 9.7 from 9.4 from 6.7. Patient with brown stools today Sophia nursing tech yesterday. Patient with no further tarry stools. Follow H&H.   #3 hemorrhagic shock Secondary to problem #1. Blood pressure improved. Off pressors. Monitor closely. Follow.  #4 status post vent dependent respiratory failure On admission patient was intubated and placed on the vent for airway protection. Patient is extubated. Patient protecting airway. Follow.  #5 acute on chronic kidney disease stage III Likely secondary to prerenal azotemia secondary to hemorrhagic shock vs volume overload. Patient received blood products and has had 11 units of PRBcs. UA  Nitrite negative leukocytes negative. Urinalysis negative for proteinuria or glucose. Renal function trending down. Follow.  #6 hypoxia Likely secondary to volume overload from significant blood products and aggressive fluid resuscitation on arrival S patient was in hemorrhagic shock. Patient status post 11 units packed red blood cells.  Crackles noted on lung examination 3 days ago. Chest x-ray negative. Improved. Patient with sats of 96% on  room air.  #7 hypokalemia/hypernatremia Replete potassium. Hypernatremia improved. Follow.  #8 leukocytosis Likely a stress the margination and secondary to Escherichia coli UTI. Chest x-ray is negative for any acute infiltrate.  Urinalysis is negative. Urine cultures with greater than 100,000 Escherichia coli. Change IV Rocephin to oral Ceftin to complete course of antibiotic therapy.  #9 elevated troponin/probable demand ischemia Troponins were elevated likely secondary to admission hemoglobin of 4. Patient denies any active chest pain. EKG with normal sinus rhythm with no signs of ischemia. 2-D echo with EF of 60-65% with no wall motion abnormalities with grade 1 diastolic dysfunction. Monitor closely for volume overload. Patient is being followed by cardiology and treatment limited by inability to give anticoagulation due to current bleed. Continue low-dose Coreg as hypotension has improved. Patient for outpatient stress test Sophia cardiology recommendations.  #10 hypothyroidism TSH at 6.292.  Continue oral Synthroid 37.5 MCG's daily.    #11 diabetes mellitus Not on any outpatient medications. Hemoglobin A1c = 6. CBGs RANGED FROM 101-157. Sliding scale insulin.  #12 EColi UTI Change IV Rocephin to oral Ceftin to complete course of antibiotic therapy.  [id number] acute metabolic encephalopathy secondary to sedation Resolved.  #14 prophylaxis PPI for GI prophylaxis.  Code Status: Full Family Communication: Updated patient. No family present. Disposition Plan: Hopefully to skilled nursing facility versus CIR when medically stable, when hemoglobin is stable, no further bleeding and renal function back to baseline. Hopefully 1-2 days.   Consultants:  PCCM admit : Dr. Lamonte Sakai 04/29/2015    cardiology Dr. Johnsie Cancel 04/30/2015  Gen. surgery Dr. Barry Dienes 04/30/2015  Interventional radiology Dr. Jarvis Newcomer 04/30/2015  Gastroenterology Dr. Watt Climes 04/29/2015   Procedures:  Upper endoscopy 04/29/2015  Dr. Watt Climes  Chest x-ray 05/03/2015, 05/01/2015, 04/29/2015, 05/04/2015  Abdominal films 04/29/2015  IR embolization of gastroduodenal artery Dr Vernard Gambles 04/30/2015  2-D echo 05/01/2015  ETT 12/12 >>12/14  [facility] CVL 12/14  R femoral 12/12 >12/14  11 units packed red blood cells since admission  3 units FFP  1 unit pheresed platelets  Antibiotics:  IV Rocephin 05/06/2015>> 05/07/2015  Oral Ceftin 05/08/2015  HPI/Subjective: Patient denies any shortness of breath. No chest pain. Patient tolerating soft diet. Patient states Sophia [name] did not see her stool today and unable to tell whether it was melanotic or tarry.  Objective: Filed Vitals:   05/07/15 0839 05/07/15 1108  BP: 143/65 134/50  Pulse: 76 79  Temp:  98.6 F (37 C)  Resp:  19    Intake/Output Summary (Last 24 hours) at 05/07/15 1241 Last data filed at 05/07/15 [id number]  Gross Sophia 24 hour  Intake 1812.15 ml  Output   1101 ml  Net 711.15 ml   Filed Weights   05/05/15 0425 05/06/15 1500 05/07/15 0408  Weight: 74.4 kg (164 lb 0.4 oz) 70.4 kg (155 lb 3.3 oz) 69.673 kg (153 lb 9.6 oz)    Exam:   General:  NAD  Cardiovascular: RRR  Respiratory:CTAB  Abdomen: Soft/ND/+BS/NT  Musculoskeletal: No c/c/e  Data Reviewed: Basic Metabolic Panel:  Recent Labs Lab 05/01/15 0630 05/02/15 0425 05/03/15 0258 05/04/15 0454 05/05/15 0320 05/06/15 0450 05/07/15 0744  NA 144 148* 147* 147* 145 144 142  K 3.6 3.5 3.4* 3.4* 3.5 3.3* 3.5  CL 117* 117* 118* 116* 116* 111 109  CO2 21* 25 23 25 25 27 25   GLUCOSE 189* 107* 110* 107* 116* 101* 107*  BUN  41* 46* 51* 47* 39* 31* 23*  CREATININE 2.49* 2.59* 2.41* 2.14* 1.94* 1.67* 1.57*  CALCIUM 5.7* 6.5* 6.7* 7.3* 7.4* 7.4* 7.7*  MG 1.7 1.7 1.8 2.2 1.9 1.7 2.0  PHOS 2.1* 2.7 2.5  --   --   --   --    Liver Function Tests: No results for input(s): AST, ALT, ALKPHOS, BILITOT, PROT, ALBUMIN in the last 168 hours. No results for input(s): LIPASE, AMYLASE in the last 168  hours. No results for input(s): AMMONIA in the last 168 hours. CBC:  Recent Labs Lab 05/02/15 0425  05/03/15 0258  05/04/15 0454 05/04/15 1400 05/05/15 0320 05/06/15 0450 05/07/15 0744  WBC 19.2*  --  18.3*  --  14.6*  --  13.7* 10.9* 10.3  NEUTROABS 16.6*  --  15.5*  --  11.4*  --  10.4*  --  7.7  HGB 7.5*  < > 6.7*  < > 9.4* 10.6* 9.7* 9.4* 9.9*  HCT 22.3*  < > 19.5*  < > 27.8* 31.7* 28.9* 29.6* 30.4*  MCV 84.5  --  84.8  --  84.2  --  85.8 88.1 88.1  PLT 95*  --  110*  --  98*  --  125* 162 224  < > = values in this interval not displayed. Cardiac Enzymes:  Recent Labs Lab 04/30/15 1511 04/30/15 1945 05/01/15 0253  TROPONINI 3.54* 4.13* 2.29*   BNP (last 3 results) No results for input(s): BNP in the last 8760 hours.  ProBNP (last 3 results) No results for input(s): PROBNP in the last 8760 hours.  CBG:  Recent Labs Lab 05/06/15 1204 05/06/15 1614 05/06/15 2107 05/07/15 0539 05/07/15 1112  GLUCAP 136* 149* 160* 101* 139*    Recent Results (from the past 240 hour(s))  MRSA PCR Screening     Status: None   Collection Time: 04/30/15  2:24 AM  Result Value Ref Range Status   MRSA by PCR NEGATIVE NEGATIVE Final    Comment:        The GeneXpert MRSA Assay (FDA approved for NASAL specimens only), is one component of a comprehensive MRSA colonization surveillance program. It is not intended to diagnose MRSA infection nor to guide or monitor treatment for MRSA infections.   Culture, Urine     Status: None   Collection Time: 05/04/15 12:00 PM  Result Value Ref Range Status   Specimen Description URINE, CATHETERIZED  Final   Special Requests NONE  Final   Culture >=100,000 COLONIES/mL ESCHERICHIA COLI  Final   Report Status 05/06/2015 FINAL  Final   Organism ID, Bacteria ESCHERICHIA COLI  Final      Susceptibility   Escherichia coli - MIC*    AMPICILLIN <=2 SENSITIVE Sensitive     CEFAZOLIN <=4 SENSITIVE Sensitive     CEFTRIAXONE <=1 SENSITIVE  Sensitive     CIPROFLOXACIN <=0.25 SENSITIVE Sensitive     GENTAMICIN <=1 SENSITIVE Sensitive     IMIPENEM <=0.25 SENSITIVE Sensitive     NITROFURANTOIN <=16 SENSITIVE Sensitive     TRIMETH/SULFA <=20 SENSITIVE Sensitive     AMPICILLIN/SULBACTAM <=2 SENSITIVE Sensitive     PIP/TAZO <=4 SENSITIVE Sensitive     * >=100,000 COLONIES/mL ESCHERICHIA COLI     Studies: No results found.  Scheduled Meds: . sodium chloride   Intravenous Once  . antiseptic oral rinse  7 mL Mouth Rinse BID  . carvedilol  3.125 mg Oral BID WC  . cefTRIAXone (ROCEPHIN)  IV  1 g Intravenous Q24H  .  insulin aspart  0-9 Units Subcutaneous TID WC  . levothyroxine  37.5 mcg Oral QAC breakfast  . neomycin-polymyxin b-dexamethasone  1 application Right Eye QHS  . pantoprazole  40 mg Oral BID AC  . simvastatin  20 mg Oral QHS   Continuous Infusions:    Principal Problem:   Gastrointestinal hemorrhage associated with duodenal ulcer Active Problems:   Hemorrhagic shock   HTN (hypertension)   Diabetes mellitus without complication (HCC)   Chronic kidney disease   Upper GI bleed   Acute respiratory failure (HCC)   Metabolic acidosis   Acute respiratory failure with hypoxia (HCC)   Elevated troponin   Acute kidney injury superimposed on CKD ([place])   Leukocytosis   Acute blood loss anemia   Thyroid activity decreased   Hypernatremia   Hypoxia   E-coli UTI    Time spent: 45 mins     [facility] MD [facility] Pager [phone]. If 7PM-7AM, please contact night-coverage at [email or web address], password [facility] 05/07/2015, 12:41 PM  LOS: 8 days

## 2015-05-07 NOTE — Progress Notes (Signed)
Physical medicine rehabilitation consult requested chart reviewed. Physical and occupational therapy evaluations completed 05/06/2015 as patient was minimal assist  200 feet on initial evaluation. Recommendations at this time are for skilled nursing facility of family cannot provide assistance as recommended by both physical and occupational therapy versus discharge to home. Hold on formal rehabilitation consult at this time with recommendations being made by physical and occupational therapy.

## 2015-05-07 NOTE — Progress Notes (Signed)
Pt a/o, pt c/o sore throat, Cepacol lozenges ordered, pt OOB Ax1 with walker, VSS, pt stable

## 2015-05-07 NOTE — Progress Notes (Signed)
Patient Name: Sophia [name] Date of Encounter: 05/07/2015     Principal Problem:   Gastrointestinal hemorrhage associated with duodenal ulcer Active Problems:   HTN (hypertension)   Diabetes mellitus without complication (HCC)   Chronic kidney disease   Upper GI bleed   Acute respiratory failure (HCC)   Hemorrhagic shock   Metabolic acidosis   Acute respiratory failure with hypoxia (HCC)   Elevated troponin   Acute kidney injury superimposed on CKD (HCC)   Leukocytosis   Acute blood loss anemia   Thyroid activity decreased   Hypernatremia   Hypoxia   E-coli UTI    SUBJECTIVE  Patient feels well.  No chest pain or dyspnea. NSR.  CURRENT MEDS . sodium chloride   Intravenous Once  . antiseptic oral rinse  7 mL Mouth Rinse BID  . carvedilol  3.125 mg Oral BID WC  . cefTRIAXone (ROCEPHIN)  IV  1 g Intravenous Q24H  . insulin aspart  0-9 Units Subcutaneous TID WC  . levothyroxine  37.5 mcg Oral QAC breakfast  . neomycin-polymyxin b-dexamethasone  1 application Right Eye QHS  . pantoprazole  40 mg Oral BID AC  . simvastatin  20 mg Oral QHS    OBJECTIVE  Filed Vitals:   05/06/15 2002 05/06/15 2354 05/07/15 0408 05/07/15 0839  BP: 145/62 141/64 162/75 143/65  Pulse: 64 79 76 76  Temp: 99.2 F (37.3 C)  98.3 F (36.8 C)   TempSrc: Oral  Oral   Resp: 18 18 18    Height:      Weight:   153 lb 9.6 oz (69.673 kg)   SpO2: 91% 92% 93%     Intake/Output Summary (Last 24 hours) at 05/07/15 0953 Last data filed at 05/07/15 0838  Gross per 24 hour  Intake 1862.15 ml  Output   1101 ml  Net 761.15 ml   Filed Weights   05/05/15 0425 05/06/15 1500 05/07/15 0408  Weight: 164 lb 0.4 oz (74.4 kg) 155 lb 3.3 oz (70.4 kg) 153 lb 9.6 oz (69.673 kg)    PHYSICAL EXAM  General: Pleasant, NAD. Neuro: Alert and oriented X 3. Moves all extremities spontaneously. Psych: Normal affect. HEENT:  Normal  Neck: Supple without bruits or JVD. Lungs:  Resp regular and  unlabored, CTA. Heart: RRR no s3, s4, or murmurs. Abdomen: Soft, non-tender, non-distended, BS + x 4.  Extremities: No clubbing, cyanosis or edema. DP/PT/Radials 2+ and equal bilaterally.  Accessory Clinical Findings  CBC  Recent Labs  05/05/15 0320 05/06/15 0450 05/07/15 0744  WBC 13.7* 10.9* 10.3  NEUTROABS 10.4*  --  7.7  HGB 9.7* 9.4* 9.9*  HCT 28.9* 29.6* 30.4*  MCV 85.8 88.1 88.1  PLT 125* 162 [id number]   Basic Metabolic Panel  Recent Labs  05/06/15 0450 05/07/15 0744  NA 144 142  K 3.3* 3.5  CL 111 109  CO2 27 25  GLUCOSE 101* 107*  BUN 31* 23*  CREATININE 1.67* 1.57*  CALCIUM 7.4* 7.7*  MG 1.7 2.0   Liver Function Tests No results for input(s): AST, ALT, ALKPHOS, BILITOT, PROT, ALBUMIN in the last 72 hours. No results for input(s): LIPASE, AMYLASE in the last 72 hours. Cardiac Enzymes No results for input(s): CKTOTAL, CKMB, CKMBINDEX, TROPONINI in the last 72 hours. BNP Invalid input(s): POCBNP D-Dimer No results for input(s): DDIMER in the last 72 hours. Hemoglobin A1C No results for input(s): HGBA1C in the last 72 hours. Fasting Lipid Panel No results for input(s): CHOL, HDL, LDLCALC, TRIG, CHOLHDL,  LDLDIRECT in the last 72 hours. Thyroid Function Tests No results for input(s): TSH, T4TOTAL, T3FREE, THYROIDAB in the last 72 hours.  Invalid input(s): FREET3  TELE  NSR 72 bpm.  ECG   Radiology/Studies  Ir Angiogram Visceral Selective  04/30/2015  CLINICAL DATA:  Large volume upper GI bleed. Endoscopy revealed probable duodenal ulcer has active source, incompletely controlled. EXAM: IR EMBO ART VEN HEMORR LYMPH EXTRAV INC GUIDE ROADMAPPING; IR ULTRASOUND GUIDANCE VASC ACCESS LEFT; ARTERIOGRAPHY; SELECTIVE VISCERAL ARTERIOGRAPHY; ADDITIONAL ARTERIOGRAPHY ANESTHESIA/SEDATION: Intravenous Fentanyl and Versed had been administered for previous procedure. No additional medications were administered. Radiology RN provided continuous cardiorespiratory  monitoring. MEDICATIONS: Lidocaine 1% subcutaneous 5 mL CONTRAST:  52mL OMNIPAQUE IOHEXOL 300 MG/ML  SOLN PROCEDURE: The procedure, risks (including but not limited to bleeding, infection, organ damage ), benefits, and alternatives were explained to the spouse and daughter. Questions regarding the procedure were encouraged and answered. The family understands and consents to the procedure. Because of a indwelling right femoral venous catheter, a left-sided femoral approach was selected. Site was marked, prepped with Betadine, draped in usual sterile fashion, infiltrated locally with 1% lidocaine. Under real-time ultrasound guidance, the left femoral artery was accessed with a 21-gauge micropuncture needle, exchanged over a 018 guidewire for a transitional dilator, through which a 035 guidewire was advanced. A 5 French vascular sheath was placed. Through this a 5 French C2 catheter was coaxial advanced in used to selectively catheterize the celiac axis for selective arteriography. An angled Glidewire was advanced into the gastroduodenal artery but the C2 catheter could not be adequately advanced distally. A coaxial renegade micro catheter with transient guidewire was then advanced through the C2 and used to selectively catheterize the gastroduodenal artery. Coil embolization of the GDA performed with 3-5 mm coils the cessation of flow. After final arteriography through the C2 catheter, the catheter and sheath were removed and hemostasis achieved with the aid of the Exoseal device after confirmatory femoral arteriography. The patient tolerated the procedure well. COMPLICATIONS: None immediate FINDINGS: Brisk bleeding from a distal branch of the gastroduodenal artery was evident on the initial arteriogram, into what is probably the lumen of the duodenum. The vessel was selectively catheterized and coil embolization performed until cessation of flow through the treated segment and no further extravasation evident. There  is elsewhere diffuse arterial vasospasm. No other pathologic regions identified. IMPRESSION: 1. Brisk active arterial extravasation from a branch of the gastroduodenal artery probably into the duodenal lumen. 2. Technically successful coil embolization of the gastroduodenal artery with cessation of extravasation. Electronically Signed   By: Lucrezia Europe M.D.   On: 04/30/2015 12:08   Ir Angiogram Selective Each Additional Vessel  04/30/2015  CLINICAL DATA:  Large volume upper GI bleed. Endoscopy revealed probable duodenal ulcer has active source, incompletely controlled. EXAM: IR EMBO ART VEN HEMORR LYMPH EXTRAV INC GUIDE ROADMAPPING; IR ULTRASOUND GUIDANCE VASC ACCESS LEFT; ARTERIOGRAPHY; SELECTIVE VISCERAL ARTERIOGRAPHY; ADDITIONAL ARTERIOGRAPHY ANESTHESIA/SEDATION: Intravenous Fentanyl and Versed had been administered for previous procedure. No additional medications were administered. Radiology RN provided continuous cardiorespiratory monitoring. MEDICATIONS: Lidocaine 1% subcutaneous 5 mL CONTRAST:  66mL OMNIPAQUE IOHEXOL 300 MG/ML  SOLN PROCEDURE: The procedure, risks (including but not limited to bleeding, infection, organ damage ), benefits, and alternatives were explained to the spouse and daughter. Questions regarding the procedure were encouraged and answered. The family understands and consents to the procedure. Because of a indwelling right femoral venous catheter, a left-sided femoral approach was selected. Site was marked, prepped with Betadine, draped in  usual sterile fashion, infiltrated locally with 1% lidocaine. Under real-time ultrasound guidance, the left femoral artery was accessed with a 21-gauge micropuncture needle, exchanged over a 018 guidewire for a transitional dilator, through which a 035 guidewire was advanced. A 5 French vascular sheath was placed. Through this a 5 French C2 catheter was coaxial advanced in used to selectively catheterize the celiac axis for selective arteriography.  An angled Glidewire was advanced into the gastroduodenal artery but the C2 catheter could not be adequately advanced distally. A coaxial renegade micro catheter with transient guidewire was then advanced through the C2 and used to selectively catheterize the gastroduodenal artery. Coil embolization of the GDA performed with 3-5 mm coils the cessation of flow. After final arteriography through the C2 catheter, the catheter and sheath were removed and hemostasis achieved with the aid of the Exoseal device after confirmatory femoral arteriography. The patient tolerated the procedure well. COMPLICATIONS: None immediate FINDINGS: Brisk bleeding from a distal branch of the gastroduodenal artery was evident on the initial arteriogram, into what is probably the lumen of the duodenum. The vessel was selectively catheterized and coil embolization performed until cessation of flow through the treated segment and no further extravasation evident. There is elsewhere diffuse arterial vasospasm. No other pathologic regions identified. IMPRESSION: 1. Brisk active arterial extravasation from a branch of the gastroduodenal artery probably into the duodenal lumen. 2. Technically successful coil embolization of the gastroduodenal artery with cessation of extravasation. Electronically Signed   By: Lucrezia Europe M.D.   On: 04/30/2015 12:08   Ir Angiogram Follow Up Study  04/30/2015  CLINICAL DATA:  Large volume upper GI bleed. Endoscopy revealed probable duodenal ulcer has active source, incompletely controlled. EXAM: IR EMBO ART VEN HEMORR LYMPH EXTRAV INC GUIDE ROADMAPPING; IR ULTRASOUND GUIDANCE VASC ACCESS LEFT; ARTERIOGRAPHY; SELECTIVE VISCERAL ARTERIOGRAPHY; ADDITIONAL ARTERIOGRAPHY ANESTHESIA/SEDATION: Intravenous Fentanyl and Versed had been administered for previous procedure. No additional medications were administered. Radiology RN provided continuous cardiorespiratory monitoring. MEDICATIONS: Lidocaine 1% subcutaneous 5 mL  CONTRAST:  23mL OMNIPAQUE IOHEXOL 300 MG/ML  SOLN PROCEDURE: The procedure, risks (including but not limited to bleeding, infection, organ damage ), benefits, and alternatives were explained to the spouse and daughter. Questions regarding the procedure were encouraged and answered. The family understands and consents to the procedure. Because of a indwelling right femoral venous catheter, a left-sided femoral approach was selected. Site was marked, prepped with Betadine, draped in usual sterile fashion, infiltrated locally with 1% lidocaine. Under real-time ultrasound guidance, the left femoral artery was accessed with a 21-gauge micropuncture needle, exchanged over a 018 guidewire for a transitional dilator, through which a 035 guidewire was advanced. A 5 French vascular sheath was placed. Through this a 5 French C2 catheter was coaxial advanced in used to selectively catheterize the celiac axis for selective arteriography. An angled Glidewire was advanced into the gastroduodenal artery but the C2 catheter could not be adequately advanced distally. A coaxial renegade micro catheter with transient guidewire was then advanced through the C2 and used to selectively catheterize the gastroduodenal artery. Coil embolization of the GDA performed with 3-5 mm coils the cessation of flow. After final arteriography through the C2 catheter, the catheter and sheath were removed and hemostasis achieved with the aid of the Exoseal device after confirmatory femoral arteriography. The patient tolerated the procedure well. COMPLICATIONS: None immediate FINDINGS: Brisk bleeding from a distal branch of the gastroduodenal artery was evident on the initial arteriogram, into what is probably the lumen of the duodenum. The vessel  was selectively catheterized and coil embolization performed until cessation of flow through the treated segment and no further extravasation evident. There is elsewhere diffuse arterial vasospasm. No other  pathologic regions identified. IMPRESSION: 1. Brisk active arterial extravasation from a branch of the gastroduodenal artery probably into the duodenal lumen. 2. Technically successful coil embolization of the gastroduodenal artery with cessation of extravasation. Electronically Signed   By: Lucrezia Europe M.D.   On: 04/30/2015 12:08   Ir US Guide Vasc Access Left  04/30/2015  CLINICAL DATA:  Large volume upper GI bleed. Endoscopy revealed probable duodenal ulcer has active source, incompletely controlled. EXAM: IR EMBO ART VEN HEMORR LYMPH EXTRAV INC GUIDE ROADMAPPING; IR ULTRASOUND GUIDANCE VASC ACCESS LEFT; ARTERIOGRAPHY; SELECTIVE VISCERAL ARTERIOGRAPHY; ADDITIONAL ARTERIOGRAPHY ANESTHESIA/SEDATION: Intravenous Fentanyl and Versed had been administered for previous procedure. No additional medications were administered. Radiology RN provided continuous cardiorespiratory monitoring. MEDICATIONS: Lidocaine 1% subcutaneous 5 mL CONTRAST:  13mL OMNIPAQUE IOHEXOL 300 MG/ML  SOLN PROCEDURE: The procedure, risks (including but not limited to bleeding, infection, organ damage ), benefits, and alternatives were explained to the spouse and daughter. Questions regarding the procedure were encouraged and answered. The family understands and consents to the procedure. Because of a indwelling right femoral venous catheter, a left-sided femoral approach was selected. Site was marked, prepped with Betadine, draped in usual sterile fashion, infiltrated locally with 1% lidocaine. Under real-time ultrasound guidance, the left femoral artery was accessed with a 21-gauge micropuncture needle, exchanged over a 018 guidewire for a transitional dilator, through which a 035 guidewire was advanced. A 5 French vascular sheath was placed. Through this a 5 French C2 catheter was coaxial advanced in used to selectively catheterize the celiac axis for selective arteriography. An angled Glidewire was advanced into the gastroduodenal artery but  the C2 catheter could not be adequately advanced distally. A coaxial renegade micro catheter with transient guidewire was then advanced through the C2 and used to selectively catheterize the gastroduodenal artery. Coil embolization of the GDA performed with 3-5 mm coils the cessation of flow. After final arteriography through the C2 catheter, the catheter and sheath were removed and hemostasis achieved with the aid of the Exoseal device after confirmatory femoral arteriography. The patient tolerated the procedure well. COMPLICATIONS: None immediate FINDINGS: Brisk bleeding from a distal branch of the gastroduodenal artery was evident on the initial arteriogram, into what is probably the lumen of the duodenum. The vessel was selectively catheterized and coil embolization performed until cessation of flow through the treated segment and no further extravasation evident. There is elsewhere diffuse arterial vasospasm. No other pathologic regions identified. IMPRESSION: 1. Brisk active arterial extravasation from a branch of the gastroduodenal artery probably into the duodenal lumen. 2. Technically successful coil embolization of the gastroduodenal artery with cessation of extravasation. Electronically Signed   By: Lucrezia Europe M.D.   On: 04/30/2015 12:08   Dg Chest Port 1 View  05/04/2015  CLINICAL DATA:  Hypoxia EXAM: PORTABLE CHEST - 1 VIEW COMPARISON:  05/03/2015 FINDINGS: Cardiac shadow is stable. Persistent left pleural effusion and basilar infiltrate are seen. Left jugular central line is again noted in the proximal superior vena cava. IMPRESSION: Persistent left basilar changes.  No new focal abnormality is seen. Electronically Signed   By: Inez Catalina M.D.   On: 05/04/2015 11:48   Dg Chest Port 1 View  05/03/2015  CLINICAL DATA:  Respiratory failure EXAM: PORTABLE CHEST 1 VIEW COMPARISON:  Portable chest x-ray of May 01, 2015 FINDINGS: The lungs are  adequately inflated. There is persistent basilar  atelectasis or pneumonia and small pleural effusion. The heart is top-normal in size. The pulmonary vascularity is mildly prominent centrally. There is a left internal jugular venous catheter in place whose tip projects over the midportion of the SVC. The trachea and esophagus have been extubated. IMPRESSION: Stable appearance of the chest postextubation. Persistent left lower lobe atelectasis -pneumonia with small left pleural effusion. Electronically Signed   By: David  Martinique M.D.   On: 05/03/2015 07:36   Dg Chest Port 1 View  05/01/2015  CLINICAL DATA:  Respiratory failure EXAM: PORTABLE CHEST 1 VIEW COMPARISON:  05/01/2015 FINDINGS: Stable endotracheal and NG tube position. There is left IJ central line with tip in SVC. No pneumothorax. Persistent small left pleural effusion and left basilar atelectasis or infiltrate. No pulmonary edema. IMPRESSION: Stable support apparatus. Left IJ central line in place. Persistent small left pleural effusion with left basilar atelectasis or infiltrate. Electronically Signed   By: Lahoma Crocker M.D.   On: 05/01/2015 14:28   Dg Chest Port 1 View  05/01/2015  CLINICAL DATA:  Endotracheal tube placement EXAM: PORTABLE CHEST 1 VIEW COMPARISON:  04/29/2015 FINDINGS: Grossly unchanged enlarged cardiac silhouette and mediastinal contours with atherosclerotic plaque within the thoracic aorta. Endotracheal tube overlies tracheal air column with tip approximately 1.7 cm above the carina. Enteric tube tip and side port project below the left hemidiaphragm. Worsening bibasilar heterogeneous/consolidative opacities, left greater than right. No definite pleural effusion. No pneumothorax. No definite evidence of edema. Unchanged bones. IMPRESSION: 1. Appropriately positioned support apparatus as above. No pneumothorax. 2. Worsening bibasilar heterogeneous opacities, left greater than right, atelectasis versus infiltrate. 3. No evidence of edema. Electronically Signed   By: Sandi Mariscal  M.D.   On: 05/01/2015 07:22   Dg Chest Port 1 View  04/29/2015  CLINICAL DATA:  Post intubation. EXAM: PORTABLE CHEST 1 VIEW COMPARISON:  04/29/2015 FINDINGS: Interval placement of an endotracheal tube with tip measuring 2.9 cm above the carina. Shallow inspiration. Normal heart size and pulmonary vascularity. Lungs appear clear. No blunting of costophrenic angles. No pneumothorax. Tortuous aorta. Surgical clips in the right upper quadrant. IMPRESSION: Endotracheal tube tip measures 2.9 cm above the carina. Shallow inspiration. No evidence of active pulmonary disease. Electronically Signed   By: Lucienne Capers M.D.   On: 04/29/2015 22:52   Dg Abd Portable 1v  04/29/2015  CLINICAL DATA:  81 year old female with bloody stool and vomiting blood EXAM: PORTABLE ABDOMEN - 1 VIEW COMPARISON:  None. FINDINGS: Single upright radiograph demonstrates gas within the stomach. No free air identified. External defibrillator pads project over the left chest and right upper quadrant. The heart is likely mildly enlarged. The lungs appear clear. No acute osseous abnormality. IMPRESSION: 1. No free air visualized. 2. Mild cardiomegaly. 3. The lungs appear clear. Electronically Signed   By: Jacqulynn Cadet M.D.   On: 04/29/2015 21:43   [place] Guide Roadmapping  04/30/2015  CLINICAL DATA:  Large volume upper GI bleed. Endoscopy revealed probable duodenal ulcer has active source, incompletely controlled. EXAM: IR EMBO ART VEN HEMORR LYMPH EXTRAV INC GUIDE ROADMAPPING; IR ULTRASOUND GUIDANCE VASC ACCESS LEFT; ARTERIOGRAPHY; SELECTIVE VISCERAL ARTERIOGRAPHY; ADDITIONAL ARTERIOGRAPHY ANESTHESIA/SEDATION: Intravenous Fentanyl and Versed had been administered for previous procedure. No additional medications were administered. Radiology RN provided continuous cardiorespiratory monitoring. MEDICATIONS: Lidocaine 1% subcutaneous 5 mL CONTRAST:  46mL OMNIPAQUE IOHEXOL 300 MG/ML  SOLN PROCEDURE:  The procedure, risks (including but not  limited to bleeding, infection, organ damage ), benefits, and alternatives were explained to the spouse and daughter. Questions regarding the procedure were encouraged and answered. The family understands and consents to the procedure. Because of a indwelling right femoral venous catheter, a left-sided femoral approach was selected. Site was marked, prepped with Betadine, draped in usual sterile fashion, infiltrated locally with 1% lidocaine. Under real-time ultrasound guidance, the left femoral artery was accessed with a 21-gauge micropuncture needle, exchanged over a 018 guidewire for a transitional dilator, through which a 035 guidewire was advanced. A 5 French vascular sheath was placed. Through this a 5 French C2 catheter was coaxial advanced in used to selectively catheterize the celiac axis for selective arteriography. An angled Glidewire was advanced into the gastroduodenal artery but the C2 catheter could not be adequately advanced distally. A coaxial renegade micro catheter with transient guidewire was then advanced through the C2 and used to selectively catheterize the gastroduodenal artery. Coil embolization of the GDA performed with 3-5 mm coils the cessation of flow. After final arteriography through the C2 catheter, the catheter and sheath were removed and hemostasis achieved with the aid of the Exoseal device after confirmatory femoral arteriography. The patient tolerated the procedure well. COMPLICATIONS: None immediate FINDINGS: Brisk bleeding from a distal branch of the gastroduodenal artery was evident on the initial arteriogram, into what is probably the lumen of the duodenum. The vessel was selectively catheterized and coil embolization performed until cessation of flow through the treated segment and no further extravasation evident. There is elsewhere diffuse arterial vasospasm. No other pathologic regions identified. IMPRESSION: 1. Brisk active  arterial extravasation from a branch of the gastroduodenal artery probably into the duodenal lumen. 2. Technically successful coil embolization of the gastroduodenal artery with cessation of extravasation. Electronically Signed   By: Lucrezia Europe M.D.   On: 04/30/2015 12:08    ASSESSMENT AND PLAN  1. Demand Ischemia in the setting of blood loss anemia: . She is asymptomatic. Echo demonstrated normal EF without WMA. We will plan for an outpatient stress Myoview once she has stabilized from her anemia and her GI bleed.   2. Bradycardia: Stable. Tolerating carvedilol  3. Hypotension: BP now trending upward. If renal function continues to improve may be able to re-introduce ARB. 4. GI Bleed: S/P IR embolization of gastroduodenal artery. She has received 11 units of PRBC's and 3 units of FFP. Hgb improving. 5. Hypokalemia: Repletion per primary service  Signed, Warren Danes MD

## 2015-05-07 NOTE — Progress Notes (Addendum)
Pt has ecchymotic/swollen area to right forearm, ice pack applied, and arm elevated, pt not sure if she hit her arm when she fell prior to admission,

## 2015-05-07 NOTE — NC FL2 (Signed)
[place] LEVEL OF CARE SCREENING TOOL     IDENTIFICATION  Patient Name: Sophia [name] Birthdate: 10/04/1931 Sex: female Admission Date (Current Location): 04/29/2015  [facility] and [place] Number:  [employer] and Address:  [facility], [place] [address] [place], [place] [address]      Provider Number: [id number]  Attending Physician Name and Address:  Eugenie Filler, MD  Relative Name and Phone Number:  Elisabel Lesueur, [phone]    Current Level of Care: Hospital Recommended Level of Care: [place] Prior Approval Number:    Date Approved/Denied:   PASRR Number: [id number]  Discharge Plan: SNF    Current Diagnoses: Patient Active Problem List   Diagnosis Date Noted  . E-coli UTI 05/06/2015  . Hypernatremia 05/04/2015  . Hypoxia 05/04/2015  . Acute kidney injury superimposed on CKD ([place]) 05/03/2015  . Leukocytosis 05/03/2015  . Acute blood loss anemia 05/03/2015  . Thyroid activity decreased   . Metabolic acidosis 0000000  . Acute respiratory failure with hypoxia ([place]) 04/30/2015  . Elevated troponin 04/30/2015  . Gastrointestinal hemorrhage associated with duodenal ulcer   . Acute respiratory failure ([place])   . Hemorrhagic shock   . Upper GI bleed 04/29/2015  . Diabetes mellitus without complication ([place])   . Chronic kidney disease   . Elevated random blood glucose level 01/05/2013  . HLD (hyperlipidemia) 10/24/2012  . HTN (hypertension) 10/24/2012  . Osteoporosis, unspecified 10/24/2012  . Unspecified vitamin D deficiency 10/24/2012  . Hyperglycemia 10/24/2012  . Special screening for malignant neoplasms, colon 07/20/2011  . Benign neoplasm of colon 07/20/2011  . Diverticulosis of colon (without mention of hemorrhage) 07/20/2011    Orientation RESPIRATION BLADDER Height & Weight    Place, Situation, Time, Self  Normal Continent 5\' 4"  (162.6 cm) 153 lbs.  BEHAVIORAL  SYMPTOMS/MOOD NEUROLOGICAL BOWEL NUTRITION STATUS   (N/A)  (N/A) Continent  (See DC Summary.)  AMBULATORY STATUS COMMUNICATION OF NEEDS Skin   Extensive Assist Verbally Surgical wounds (Incisions. See DC Summary.)                       Personal Care Assistance Level of Assistance  Bathing, Feeding, Dressing Bathing Assistance: Independent Feeding assistance: Independent Dressing Assistance: Limited assistance     Functional Limitations Info             SPECIAL CARE FACTORS FREQUENCY  PT (By licensed PT), OT (By licensed OT)     PT Frequency: 5x/week OT Frequency: 2x/week            Contractures      Additional Factors Info  Code Status, Allergies, Insulin Sliding Scale Code Status Info: Full Allergies Info: Levothyroxine   Insulin Sliding Scale Info: insulin aspart (novoLOG) injection 0-9 Units 3 times daily w/meal       Current Medications (05/07/2015):  This is the current hospital active medication list Current Facility-Administered Medications  Medication Dose Route Frequency Provider Last Rate Last Dose  . 0.9 %  sodium chloride infusion  250 mL Intravenous PRN Rahul P Desai, PA-C      . 0.9 %  sodium chloride infusion   Intravenous Once Colbert Coyer, MD      . antiseptic oral rinse (CPC / CETYLPYRIDINIUM CHLORIDE 0.05%) solution 7 mL  7 mL Mouth Rinse BID Collene Gobble, MD   7 mL at 05/07/15 0951  . carvedilol (COREG) tablet 3.125 mg  3.125 mg Oral BID WC Quillian Quince  Durene Cal, MD   3.125 mg at 05/07/15 [id number]  . [START ON 05/08/2015] cefUROXime (CEFTIN) tablet 500 mg  500 mg Oral BID WC Irine Seal V, MD      . hyoscyamine (LEVSIN SL) SL tablet 0.125 mg  0.125 mg Sublingual Q4H PRN Eugenie Filler, MD      . insulin aspart (novoLOG) injection 0-9 Units  0-9 Units Subcutaneous TID WC Eugenie Filler, MD   1 Units at 05/07/15 1219  . levothyroxine (SYNTHROID, LEVOTHROID) tablet 37.5 mcg  37.5 mcg Oral QAC breakfast Eugenie Filler, MD   37.5  mcg at 05/07/15 0600  . menthol-cetylpyridinium (CEPACOL) lozenge 3 mg  1 lozenge Oral PRN Gardiner Barefoot, NP   3 mg at 05/06/15 2303  . midazolam (VERSED) injection 1 mg  1 mg Intravenous Q15 min PRN Rahul P Desai, PA-C      . midazolam (VERSED) injection 1 mg  1 mg Intravenous Q2H PRN Rahul P Desai, PA-C      . neomycin-polymyxin b-dexamethasone (MAXITROL) ophthalmic ointment 1 application  1 application Right Eye QHS Eugenie Filler, MD   1 application at [id number]  . pantoprazole (PROTONIX) EC tablet 40 mg  40 mg Oral BID AC Eugenie Filler, MD   40 mg at 05/07/15 0600  . simvastatin (ZOCOR) tablet 20 mg  20 mg Oral QHS Eugenie Filler, MD   20 mg at 05/06/15 2118  . sodium chloride 0.9 % injection 10-40 mL  10-40 mL Intracatheter PRN Eugenie Filler, MD         Discharge Medications: Please see discharge summary for a list of discharge medications.  Relevant Imaging Results:  Relevant Lab Results:   Additional Information SSN: [id number]  Benard Halsted, LCSWA

## 2015-05-08 LAB — BASIC METABOLIC PANEL
Anion gap: 8 (ref 5–15)
BUN: 19 mg/dL (ref 6–20)
CO2: 25 mmol/L (ref 22–32)
Calcium: 7.7 mg/dL — ABNORMAL LOW (ref 8.9–10.3)
Chloride: 109 mmol/L (ref 101–111)
Creatinine, Ser: 1.49 mg/dL — ABNORMAL HIGH (ref 0.44–1.00)
GFR calc Af Amer: 37 mL/min — ABNORMAL LOW (ref >60)
GFR calc non Af Amer: 32 mL/min — ABNORMAL LOW (ref >60)
Glucose, Bld: 109 mg/dL — ABNORMAL HIGH (ref 65–99)
Potassium: 3.6 mmol/L (ref 3.5–5.1)
Sodium: 142 mmol/L (ref 135–145)

## 2015-05-08 LAB — GLUCOSE, CAPILLARY
Glucose-Capillary: 100 mg/dL — ABNORMAL HIGH (ref 65–99)
Glucose-Capillary: 150 mg/dL — ABNORMAL HIGH (ref 65–99)

## 2015-05-08 LAB — CBC
HCT: 30.3 % — ABNORMAL LOW (ref 36.0–46.0)
Hemoglobin: 10.1 g/dL — ABNORMAL LOW (ref 12.0–15.0)
MCH: 29.3 pg (ref 26.0–34.0)
MCHC: 33.3 g/dL (ref 30.0–36.0)
MCV: 87.8 fL (ref 78.0–100.0)
Platelets: 261 10*3/uL (ref 150–400)
RBC: 3.45 MIL/uL — ABNORMAL LOW (ref 3.87–5.11)
RDW: 17.7 % — ABNORMAL HIGH (ref 11.5–15.5)
WBC: 11.4 10*3/uL — ABNORMAL HIGH (ref 4.0–10.5)

## 2015-05-08 LAB — MAGNESIUM: Magnesium: 1.7 mg/dL (ref 1.7–2.4)

## 2015-05-08 MED ORDER — LEVOTHYROXINE SODIUM 75 MCG PO TABS: 37.5000 ug | ORAL_TABLET | Freq: Every day | ORAL | Status: DC

## 2015-05-08 MED ORDER — CARVEDILOL 3.125 MG PO TABS: 3.1250 mg | ORAL_TABLET | Freq: Two times a day (BID) | ORAL | Status: DC

## 2015-05-08 MED ORDER — OMEPRAZOLE 40 MG PO CPDR: 40.0000 mg | DELAYED_RELEASE_CAPSULE | Freq: Two times a day (BID) | ORAL | Status: DC

## 2015-05-08 NOTE — Progress Notes (Signed)
Patient Name: Sophia [name] Date of Encounter: 05/08/2015     Principal Problem:   Gastrointestinal hemorrhage associated with duodenal ulcer Active Problems:   HTN (hypertension)   Diabetes mellitus without complication (HCC)   Chronic kidney disease   Upper GI bleed   Acute respiratory failure (HCC)   Hemorrhagic shock   Metabolic acidosis   Acute respiratory failure with hypoxia (HCC)   Elevated troponin   Acute kidney injury superimposed on CKD (HCC)   Leukocytosis   Acute blood loss anemia   Thyroid activity decreased   Hypernatremia   Hypoxia   E-coli UTI    SUBJECTIVE  Doing well. Has walked in hall.  No chest pain.  CURRENT MEDS . sodium chloride   Intravenous Once  . antiseptic oral rinse  7 mL Mouth Rinse BID  . carvedilol  3.125 mg Oral BID WC  . cefUROXime  500 mg Oral BID WC  . insulin aspart  0-9 Units Subcutaneous TID WC  . levothyroxine  37.5 mcg Oral QAC breakfast  . neomycin-polymyxin b-dexamethasone  1 application Right Eye QHS  . pantoprazole  40 mg Oral BID AC  . simvastatin  20 mg Oral QHS    OBJECTIVE  Filed Vitals:   05/07/15 0839 05/07/15 1108 05/07/15 1952 05/08/15 0421  BP: 143/65 134/50 151/78 154/76  Pulse: 76 79 81 72  Temp:  98.6 F (37 C) 99.5 F (37.5 C) 98.6 F (37 C)  TempSrc:  Oral Oral Oral  Resp:  19 18 18   Height:      Weight:    146 lb 13.2 oz (66.6 kg)  SpO2:  96% 98% 95%    Intake/Output Summary (Last 24 hours) at 05/08/15 1034 Last data filed at 05/08/15 0851  Gross per 24 hour  Intake    980 ml  Output   2552 ml  Net  -1572 ml   Filed Weights   05/06/15 1500 05/07/15 0408 05/08/15 0421  Weight: 155 lb 3.3 oz (70.4 kg) 153 lb 9.6 oz (69.673 kg) 146 lb 13.2 oz (66.6 kg)    PHYSICAL EXAM  General: Pleasant, NAD. Neuro: Alert and oriented X 3. Moves all extremities spontaneously. Psych: Normal affect. HEENT:  Normal  Neck: Supple without bruits or JVD. Lungs:  Resp regular and unlabored,  CTA. Heart: RRR no s3, s4, or murmurs. Abdomen: Soft, non-tender, non-distended, BS + x 4.  Extremities: No clubbing, cyanosis or edema. DP/PT/Radials 2+ and equal bilaterally.  Accessory Clinical Findings  CBC  Recent Labs  05/07/15 0744 05/08/15 0350  WBC 10.3 11.4*  NEUTROABS 7.7  --   HGB 9.9* 10.1*  HCT 30.4* 30.3*  MCV 88.1 87.8  PLT 224 0000000   Basic Metabolic Panel  Recent Labs  05/07/15 0744 05/08/15 0350  NA 142 142  K 3.5 3.6  CL 109 109  CO2 25 25  GLUCOSE 107* 109*  BUN 23* 19  CREATININE 1.57* 1.49*  CALCIUM 7.7* 7.7*  MG 2.0 1.7   Liver Function Tests No results for input(s): AST, ALT, ALKPHOS, BILITOT, PROT, ALBUMIN in the last 72 hours. No results for input(s): LIPASE, AMYLASE in the last 72 hours. Cardiac Enzymes No results for input(s): CKTOTAL, CKMB, CKMBINDEX, TROPONINI in the last 72 hours. BNP Invalid input(s): POCBNP D-Dimer No results for input(s): DDIMER in the last 72 hours. Hemoglobin A1C No results for input(s): HGBA1C in the last 72 hours. Fasting Lipid Panel No results for input(s): CHOL, HDL, LDLCALC, TRIG, CHOLHDL, LDLDIRECT in the  last 72 hours. Thyroid Function Tests No results for input(s): TSH, T4TOTAL, T3FREE, THYROIDAB in the last 72 hours.  Invalid input(s): FREET3  TELE  NSR  ECG    Radiology/Studies  Ir Angiogram Visceral Selective  04/30/2015  CLINICAL DATA:  Large volume upper GI bleed. Endoscopy revealed probable duodenal ulcer has active source, incompletely controlled. EXAM: IR EMBO ART VEN HEMORR LYMPH EXTRAV INC GUIDE ROADMAPPING; IR ULTRASOUND GUIDANCE VASC ACCESS LEFT; ARTERIOGRAPHY; SELECTIVE VISCERAL ARTERIOGRAPHY; ADDITIONAL ARTERIOGRAPHY ANESTHESIA/SEDATION: Intravenous Fentanyl and Versed had been administered for previous procedure. No additional medications were administered. Radiology RN provided continuous cardiorespiratory monitoring. MEDICATIONS: Lidocaine 1% subcutaneous 5 mL CONTRAST:  76mL  OMNIPAQUE IOHEXOL 300 MG/ML  SOLN PROCEDURE: The procedure, risks (including but not limited to bleeding, infection, organ damage ), benefits, and alternatives were explained to the spouse and daughter. Questions regarding the procedure were encouraged and answered. The family understands and consents to the procedure. Because of a indwelling right femoral venous catheter, a left-sided femoral approach was selected. Site was marked, prepped with Betadine, draped in usual sterile fashion, infiltrated locally with 1% lidocaine. Under real-time ultrasound guidance, the left femoral artery was accessed with a 21-gauge micropuncture needle, exchanged over a 018 guidewire for a transitional dilator, through which a 035 guidewire was advanced. A 5 French vascular sheath was placed. Through this a 5 French C2 catheter was coaxial advanced in used to selectively catheterize the celiac axis for selective arteriography. An angled Glidewire was advanced into the gastroduodenal artery but the C2 catheter could not be adequately advanced distally. A coaxial renegade micro catheter with transient guidewire was then advanced through the C2 and used to selectively catheterize the gastroduodenal artery. Coil embolization of the GDA performed with 3-5 mm coils the cessation of flow. After final arteriography through the C2 catheter, the catheter and sheath were removed and hemostasis achieved with the aid of the Exoseal device after confirmatory femoral arteriography. The patient tolerated the procedure well. COMPLICATIONS: None immediate FINDINGS: Brisk bleeding from a distal branch of the gastroduodenal artery was evident on the initial arteriogram, into what is probably the lumen of the duodenum. The vessel was selectively catheterized and coil embolization performed until cessation of flow through the treated segment and no further extravasation evident. There is elsewhere diffuse arterial vasospasm. No other pathologic regions  identified. IMPRESSION: 1. Brisk active arterial extravasation from a branch of the gastroduodenal artery probably into the duodenal lumen. 2. Technically successful coil embolization of the gastroduodenal artery with cessation of extravasation. Electronically Signed   By: Lucrezia Europe M.D.   On: 04/30/2015 12:08   Ir Angiogram Selective Each Additional Vessel  04/30/2015  CLINICAL DATA:  Large volume upper GI bleed. Endoscopy revealed probable duodenal ulcer has active source, incompletely controlled. EXAM: IR EMBO ART VEN HEMORR LYMPH EXTRAV INC GUIDE ROADMAPPING; IR ULTRASOUND GUIDANCE VASC ACCESS LEFT; ARTERIOGRAPHY; SELECTIVE VISCERAL ARTERIOGRAPHY; ADDITIONAL ARTERIOGRAPHY ANESTHESIA/SEDATION: Intravenous Fentanyl and Versed had been administered for previous procedure. No additional medications were administered. Radiology RN provided continuous cardiorespiratory monitoring. MEDICATIONS: Lidocaine 1% subcutaneous 5 mL CONTRAST:  42mL OMNIPAQUE IOHEXOL 300 MG/ML  SOLN PROCEDURE: The procedure, risks (including but not limited to bleeding, infection, organ damage ), benefits, and alternatives were explained to the spouse and daughter. Questions regarding the procedure were encouraged and answered. The family understands and consents to the procedure. Because of a indwelling right femoral venous catheter, a left-sided femoral approach was selected. Site was marked, prepped with Betadine, draped in usual sterile fashion, infiltrated  locally with 1% lidocaine. Under real-time ultrasound guidance, the left femoral artery was accessed with a 21-gauge micropuncture needle, exchanged over a 018 guidewire for a transitional dilator, through which a 035 guidewire was advanced. A 5 French vascular sheath was placed. Through this a 5 French C2 catheter was coaxial advanced in used to selectively catheterize the celiac axis for selective arteriography. An angled Glidewire was advanced into the gastroduodenal artery but  the C2 catheter could not be adequately advanced distally. A coaxial renegade micro catheter with transient guidewire was then advanced through the C2 and used to selectively catheterize the gastroduodenal artery. Coil embolization of the GDA performed with 3-5 mm coils the cessation of flow. After final arteriography through the C2 catheter, the catheter and sheath were removed and hemostasis achieved with the aid of the Exoseal device after confirmatory femoral arteriography. The patient tolerated the procedure well. COMPLICATIONS: None immediate FINDINGS: Brisk bleeding from a distal branch of the gastroduodenal artery was evident on the initial arteriogram, into what is probably the lumen of the duodenum. The vessel was selectively catheterized and coil embolization performed until cessation of flow through the treated segment and no further extravasation evident. There is elsewhere diffuse arterial vasospasm. No other pathologic regions identified. IMPRESSION: 1. Brisk active arterial extravasation from a branch of the gastroduodenal artery probably into the duodenal lumen. 2. Technically successful coil embolization of the gastroduodenal artery with cessation of extravasation. Electronically Signed   By: Lucrezia Europe M.D.   On: 04/30/2015 12:08   Ir Angiogram Follow Up Study  04/30/2015  CLINICAL DATA:  Large volume upper GI bleed. Endoscopy revealed probable duodenal ulcer has active source, incompletely controlled. EXAM: IR EMBO ART VEN HEMORR LYMPH EXTRAV INC GUIDE ROADMAPPING; IR ULTRASOUND GUIDANCE VASC ACCESS LEFT; ARTERIOGRAPHY; SELECTIVE VISCERAL ARTERIOGRAPHY; ADDITIONAL ARTERIOGRAPHY ANESTHESIA/SEDATION: Intravenous Fentanyl and Versed had been administered for previous procedure. No additional medications were administered. Radiology RN provided continuous cardiorespiratory monitoring. MEDICATIONS: Lidocaine 1% subcutaneous 5 mL CONTRAST:  58mL OMNIPAQUE IOHEXOL 300 MG/ML  SOLN PROCEDURE: The  procedure, risks (including but not limited to bleeding, infection, organ damage ), benefits, and alternatives were explained to the spouse and daughter. Questions regarding the procedure were encouraged and answered. The family understands and consents to the procedure. Because of a indwelling right femoral venous catheter, a left-sided femoral approach was selected. Site was marked, prepped with Betadine, draped in usual sterile fashion, infiltrated locally with 1% lidocaine. Under real-time ultrasound guidance, the left femoral artery was accessed with a 21-gauge micropuncture needle, exchanged over a 018 guidewire for a transitional dilator, through which a 035 guidewire was advanced. A 5 French vascular sheath was placed. Through this a 5 French C2 catheter was coaxial advanced in used to selectively catheterize the celiac axis for selective arteriography. An angled Glidewire was advanced into the gastroduodenal artery but the C2 catheter could not be adequately advanced distally. A coaxial renegade micro catheter with transient guidewire was then advanced through the C2 and used to selectively catheterize the gastroduodenal artery. Coil embolization of the GDA performed with 3-5 mm coils the cessation of flow. After final arteriography through the C2 catheter, the catheter and sheath were removed and hemostasis achieved with the aid of the Exoseal device after confirmatory femoral arteriography. The patient tolerated the procedure well. COMPLICATIONS: None immediate FINDINGS: Brisk bleeding from a distal branch of the gastroduodenal artery was evident on the initial arteriogram, into what is probably the lumen of the duodenum. The vessel was selectively catheterized and  coil embolization performed until cessation of flow through the treated segment and no further extravasation evident. There is elsewhere diffuse arterial vasospasm. No other pathologic regions identified. IMPRESSION: 1. Brisk active arterial  extravasation from a branch of the gastroduodenal artery probably into the duodenal lumen. 2. Technically successful coil embolization of the gastroduodenal artery with cessation of extravasation. Electronically Signed   By: Lucrezia Europe M.D.   On: 04/30/2015 12:08   Ir US Guide Vasc Access Left  04/30/2015  CLINICAL DATA:  Large volume upper GI bleed. Endoscopy revealed probable duodenal ulcer has active source, incompletely controlled. EXAM: IR EMBO ART VEN HEMORR LYMPH EXTRAV INC GUIDE ROADMAPPING; IR ULTRASOUND GUIDANCE VASC ACCESS LEFT; ARTERIOGRAPHY; SELECTIVE VISCERAL ARTERIOGRAPHY; ADDITIONAL ARTERIOGRAPHY ANESTHESIA/SEDATION: Intravenous Fentanyl and Versed had been administered for previous procedure. No additional medications were administered. Radiology RN provided continuous cardiorespiratory monitoring. MEDICATIONS: Lidocaine 1% subcutaneous 5 mL CONTRAST:  55mL OMNIPAQUE IOHEXOL 300 MG/ML  SOLN PROCEDURE: The procedure, risks (including but not limited to bleeding, infection, organ damage ), benefits, and alternatives were explained to the spouse and daughter. Questions regarding the procedure were encouraged and answered. The family understands and consents to the procedure. Because of a indwelling right femoral venous catheter, a left-sided femoral approach was selected. Site was marked, prepped with Betadine, draped in usual sterile fashion, infiltrated locally with 1% lidocaine. Under real-time ultrasound guidance, the left femoral artery was accessed with a 21-gauge micropuncture needle, exchanged over a 018 guidewire for a transitional dilator, through which a 035 guidewire was advanced. A 5 French vascular sheath was placed. Through this a 5 French C2 catheter was coaxial advanced in used to selectively catheterize the celiac axis for selective arteriography. An angled Glidewire was advanced into the gastroduodenal artery but the C2 catheter could not be adequately advanced distally. A  coaxial renegade micro catheter with transient guidewire was then advanced through the C2 and used to selectively catheterize the gastroduodenal artery. Coil embolization of the GDA performed with 3-5 mm coils the cessation of flow. After final arteriography through the C2 catheter, the catheter and sheath were removed and hemostasis achieved with the aid of the Exoseal device after confirmatory femoral arteriography. The patient tolerated the procedure well. COMPLICATIONS: None immediate FINDINGS: Brisk bleeding from a distal branch of the gastroduodenal artery was evident on the initial arteriogram, into what is probably the lumen of the duodenum. The vessel was selectively catheterized and coil embolization performed until cessation of flow through the treated segment and no further extravasation evident. There is elsewhere diffuse arterial vasospasm. No other pathologic regions identified. IMPRESSION: 1. Brisk active arterial extravasation from a branch of the gastroduodenal artery probably into the duodenal lumen. 2. Technically successful coil embolization of the gastroduodenal artery with cessation of extravasation. Electronically Signed   By: Lucrezia Europe M.D.   On: 04/30/2015 12:08   Dg Chest Port 1 View  05/04/2015  CLINICAL DATA:  Hypoxia EXAM: PORTABLE CHEST - 1 VIEW COMPARISON:  05/03/2015 FINDINGS: Cardiac shadow is stable. Persistent left pleural effusion and basilar infiltrate are seen. Left jugular central line is again noted in the proximal superior vena cava. IMPRESSION: Persistent left basilar changes.  No new focal abnormality is seen. Electronically Signed   By: Inez Catalina M.D.   On: 05/04/2015 11:48   Dg Chest Port 1 View  05/03/2015  CLINICAL DATA:  Respiratory failure EXAM: PORTABLE CHEST 1 VIEW COMPARISON:  Portable chest x-ray of May 01, 2015 FINDINGS: The lungs are adequately inflated. There is  persistent basilar atelectasis or pneumonia and small pleural effusion. The heart  is top-normal in size. The pulmonary vascularity is mildly prominent centrally. There is a left internal jugular venous catheter in place whose tip projects over the midportion of the SVC. The trachea and esophagus have been extubated. IMPRESSION: Stable appearance of the chest postextubation. Persistent left lower lobe atelectasis -pneumonia with small left pleural effusion. Electronically Signed   By: David  Martinique M.D.   On: 05/03/2015 07:36   Dg Chest Port 1 View  05/01/2015  CLINICAL DATA:  Respiratory failure EXAM: PORTABLE CHEST 1 VIEW COMPARISON:  05/01/2015 FINDINGS: Stable endotracheal and NG tube position. There is left IJ central line with tip in SVC. No pneumothorax. Persistent small left pleural effusion and left basilar atelectasis or infiltrate. No pulmonary edema. IMPRESSION: Stable support apparatus. Left IJ central line in place. Persistent small left pleural effusion with left basilar atelectasis or infiltrate. Electronically Signed   By: Lahoma Crocker M.D.   On: 05/01/2015 14:28   Dg Chest Port 1 View  05/01/2015  CLINICAL DATA:  Endotracheal tube placement EXAM: PORTABLE CHEST 1 VIEW COMPARISON:  04/29/2015 FINDINGS: Grossly unchanged enlarged cardiac silhouette and mediastinal contours with atherosclerotic plaque within the thoracic aorta. Endotracheal tube overlies tracheal air column with tip approximately 1.7 cm above the carina. Enteric tube tip and side port project below the left hemidiaphragm. Worsening bibasilar heterogeneous/consolidative opacities, left greater than right. No definite pleural effusion. No pneumothorax. No definite evidence of edema. Unchanged bones. IMPRESSION: 1. Appropriately positioned support apparatus as above. No pneumothorax. 2. Worsening bibasilar heterogeneous opacities, left greater than right, atelectasis versus infiltrate. 3. No evidence of edema. Electronically Signed   By: Sandi Mariscal M.D.   On: 05/01/2015 07:22   Dg Chest Port 1  View  04/29/2015  CLINICAL DATA:  Post intubation. EXAM: PORTABLE CHEST 1 VIEW COMPARISON:  04/29/2015 FINDINGS: Interval placement of an endotracheal tube with tip measuring 2.9 cm above the carina. Shallow inspiration. Normal heart size and pulmonary vascularity. Lungs appear clear. No blunting of costophrenic angles. No pneumothorax. Tortuous aorta. Surgical clips in the right upper quadrant. IMPRESSION: Endotracheal tube tip measures 2.9 cm above the carina. Shallow inspiration. No evidence of active pulmonary disease. Electronically Signed   By: Lucienne Capers M.D.   On: 04/29/2015 22:52   Dg Abd Portable 1v  04/29/2015  CLINICAL DATA:  81 year old female with bloody stool and vomiting blood EXAM: PORTABLE ABDOMEN - 1 VIEW COMPARISON:  None. FINDINGS: Single upright radiograph demonstrates gas within the stomach. No free air identified. External defibrillator pads project over the left chest and right upper quadrant. The heart is likely mildly enlarged. The lungs appear clear. No acute osseous abnormality. IMPRESSION: 1. No free air visualized. 2. Mild cardiomegaly. 3. The lungs appear clear. Electronically Signed   By: Jacqulynn Cadet M.D.   On: 04/29/2015 21:43   [place] Guide Roadmapping  04/30/2015  CLINICAL DATA:  Large volume upper GI bleed. Endoscopy revealed probable duodenal ulcer has active source, incompletely controlled. EXAM: IR EMBO ART VEN HEMORR LYMPH EXTRAV INC GUIDE ROADMAPPING; IR ULTRASOUND GUIDANCE VASC ACCESS LEFT; ARTERIOGRAPHY; SELECTIVE VISCERAL ARTERIOGRAPHY; ADDITIONAL ARTERIOGRAPHY ANESTHESIA/SEDATION: Intravenous Fentanyl and Versed had been administered for previous procedure. No additional medications were administered. Radiology RN provided continuous cardiorespiratory monitoring. MEDICATIONS: Lidocaine 1% subcutaneous 5 mL CONTRAST:  49mL OMNIPAQUE IOHEXOL 300 MG/ML  SOLN PROCEDURE: The procedure, risks (including but not limited  to bleeding, infection,  organ damage ), benefits, and alternatives were explained to the spouse and daughter. Questions regarding the procedure were encouraged and answered. The family understands and consents to the procedure. Because of a indwelling right femoral venous catheter, a left-sided femoral approach was selected. Site was marked, prepped with Betadine, draped in usual sterile fashion, infiltrated locally with 1% lidocaine. Under real-time ultrasound guidance, the left femoral artery was accessed with a 21-gauge micropuncture needle, exchanged over a 018 guidewire for a transitional dilator, through which a 035 guidewire was advanced. A 5 French vascular sheath was placed. Through this a 5 French C2 catheter was coaxial advanced in used to selectively catheterize the celiac axis for selective arteriography. An angled Glidewire was advanced into the gastroduodenal artery but the C2 catheter could not be adequately advanced distally. A coaxial renegade micro catheter with transient guidewire was then advanced through the C2 and used to selectively catheterize the gastroduodenal artery. Coil embolization of the GDA performed with 3-5 mm coils the cessation of flow. After final arteriography through the C2 catheter, the catheter and sheath were removed and hemostasis achieved with the aid of the Exoseal device after confirmatory femoral arteriography. The patient tolerated the procedure well. COMPLICATIONS: None immediate FINDINGS: Brisk bleeding from a distal branch of the gastroduodenal artery was evident on the initial arteriogram, into what is probably the lumen of the duodenum. The vessel was selectively catheterized and coil embolization performed until cessation of flow through the treated segment and no further extravasation evident. There is elsewhere diffuse arterial vasospasm. No other pathologic regions identified. IMPRESSION: 1. Brisk active arterial extravasation from a branch of the  gastroduodenal artery probably into the duodenal lumen. 2. Technically successful coil embolization of the gastroduodenal artery with cessation of extravasation. Electronically Signed   By: Lucrezia Europe M.D.   On: 04/30/2015 12:08    ASSESSMENT AND PLAN  1. Demand Ischemia in the setting of blood loss anemia: . She is asymptomatic. Echo demonstrated normal EF without WMA. We will plan for an outpatient stress Myoview once she has stabilized from her anemia and her GI bleed. She will follow up with Dr. Percival Spanish in [place]  2. Bradycardia: Stable. Tolerating carvedilol  3. Hypotension: BP now trending upward. If renal function continues to improve may be able to re-introduce ARB. 4. GI Bleed: S/P IR embolization of gastroduodenal artery. She has received 11 units of PRBC's and 3 units of FFP. Hgb improving. 5. Hypokalemia: Repletion per primary service..  Plan: Anticipate home today  Signed, Warren Danes MD

## 2015-05-08 NOTE — Care Management Note (Signed)
Case Management Note  Patient Details  Name: Sophia [name] MRN: [id number] Date of Birth: 1931/09/23  Subjective/Objective:  Admitted with GI hemorrhage                  Action/Plan: Patient is progressing well and now is able to return home with [facility] services. HHC choice offered, patient chose [facility]. [name] with [facility] called for arrangements. Rolling walker ordered for home. Patient lives at home with her spouse and her granddaughter will stay with her for 9 weeks.   Expected Discharge Date:  05/07/2015               Expected Discharge Plan:  [place]  Discharge planning Services  CM Consult  Choice offered to:  Patient  DME Arranged:  Walker rolling DME Agency:     HH Arranged:  PT, OT HH Agency:  [facility]  Status of Service:  In process, will continue to follow  Medicare Important Message Given:  Yes  Sherrilyn Rist [phone] 05/08/2015, 10:49 AM

## 2015-05-08 NOTE — Progress Notes (Signed)
Occupational Therapy Treatment Patient Details Name: Sophia [name] MRN: [id number] DOB: Sep 04, 1931 Today's Date: 05/08/2015    History of present illness She was brought to [facility] ED 12/12 due to near syncopal episode along with melena. She did fall and suffered a laceration to her forehead. Found to have GI bleed   OT comments  Pt has progressed to be able to D/C home with [place]. All education regarding home safety and reducing risk of falls completed. Pt to continue twith HHOT.   Follow Up Recommendations  Home health OT;Supervision/Assistance - 24 hour    Equipment Recommendations  3 in 1 bedside comode    Recommendations for Other Services      Precautions / Restrictions Precautions Precautions: Fall Restrictions Weight Bearing Restrictions: No       Mobility Bed Mobility               General bed mobility comments: Pt up ion BSC upon arrival  Transfers Overall transfer level: Needs assistance Equipment used: Rolling walker (2 wheeled) Transfers: Sit to/from Stand Sit to Stand: Min guard         General transfer comment: verbal cues for hand placement, no LOB, no physical assist needed, steady upon standing    Balance     Sitting balance-Leahy Scale: Good       Standing balance-Leahy Scale: Fair                     ADL                                         General ADL Comments: Bed level due to central line pulled. Educated pt on reducing risk of falls at home. Written information given. discussed use of DME. REcommended for pt to use 3 in1  bedside at night to decrease risk of falls and frame over the toilet during the day. Pt ahs a shower seat she can use while bathing. Recommended for pt to have 24/7 S initially after D?C. Pt states her family will be able to proveide this level of S. Discussed activity modification and slowly increasing activity level. Pt verbalized understadning.       Vision                      Perception     Praxis      Cognition   Behavior During Therapy: WFL for tasks assessed/performed Overall Cognitive Status: Within Functional Limits for tasks assessed                       Extremity/Trunk Assessment               Exercises     Shoulder Instructions       General Comments      Pertinent Vitals/ Pain       Pain Assessment: No/denies pain  Home Living                                          Prior Functioning/Environment              Frequency Min 2X/week     Progress Toward Goals  OT Goals(current goals can now be found in the care plan section)  Progress  towards OT goals: Progressing toward goals  Acute Rehab OT Goals Patient Stated Goal: to go home OT Goal Formulation: With patient Time For Goal Achievement: 05/13/15 Potential to Achieve Goals: Good ADL Goals Pt Will Perform Grooming: with modified independence;standing Pt Will Perform Upper Body Bathing: with modified independence;sitting;standing Pt Will Perform Lower Body Bathing: with modified independence;sit to/from stand Pt Will Perform Upper Body Dressing: with modified independence;standing;sitting Pt Will Perform Lower Body Dressing: with modified independence;sit to/from stand Pt Will Transfer to Toilet: with modified independence;ambulating;regular height toilet;grab bars Pt Will Perform Toileting - Clothing Manipulation and hygiene: with modified independence;sit to/from stand Pt Will Perform Tub/Shower Transfer: Shower transfer;with modified independence;ambulating;rolling walker;shower seat  Plan Discharge plan needs to be updated    Co-evaluation                 End of Session     Activity Tolerance Patient tolerated treatment well   Patient Left in bed;with call bell/phone within reach   Nurse Communication Mobility status;Other (comment) (need for 3 in 1)        Time: [id number] OT Time Calculation (min): 13  min  Charges: OT General Charges $OT Visit: 1 Procedure OT Treatments $Self Care/Home Management : 8-22 mins  Carleena Mires,HILLARY 05/08/2015, 11:38 AM   Maurie Boettcher, OTR/L  [phone] 05/08/2015

## 2015-05-08 NOTE — Progress Notes (Signed)
3:1 ordered through [facility] and to be delivered to the room today prior to discharge home. Mindi Slicker [facility] [phone]

## 2015-05-08 NOTE — Progress Notes (Signed)
Physical Therapy Treatment Patient Details Name: Sophia [name] MRN: [id number] DOB: 1931/07/03 Today's Date: 05/08/2015    History of Present Illness She was brought to [facility] ED 12/12 due to near syncopal episode along with melena. She did fall and suffered a laceration to her forehead. Found to have GI bleed    PT Comments    Pt ambulated 400' with RW and supervision, no loss of balance, no physical assist for mobility. PT now recommending home with HHPT and RW, pt agrees.   Follow Up Recommendations  Supervision/Assistance - 24 hour;Home health PT     Equipment Recommendations  Rolling walker with 5" wheels    Recommendations for Other Services       Precautions / Restrictions Precautions Precautions: Fall Restrictions Weight Bearing Restrictions: No    Mobility  Bed Mobility               General bed mobility comments: Pt up ion BSC upon arrival  Transfers Overall transfer level: Needs assistance Equipment used: Rolling walker (2 wheeled) Transfers: Sit to/from Stand Sit to Stand: Min guard         General transfer comment: verbal cues for hand placement, no LOB, no physical assist needed, steady upon standing  Ambulation/Gait Ambulation/Gait assistance: Supervision Ambulation Distance (Feet): 400 Feet Assistive device: Rolling walker (2 wheeled) Gait Pattern/deviations: Step-through pattern   Gait velocity interpretation: at or above normal speed for age/gender General Gait Details: steady with RW, no LOB, no dyspnea, good positioning in RW, pt reports she's feeling stronger compared to last PT session but still feels she benefits from the RW   Stairs            Wheelchair Mobility    Modified Rankin (Stroke Patients Only)       Balance     Sitting balance-Leahy Scale: Good       Standing balance-Leahy Scale: Fair                      Cognition Arousal/Alertness: Awake/alert Behavior During Therapy: WFL for tasks  assessed/performed Overall Cognitive Status: Within Functional Limits for tasks assessed                      Exercises      General Comments        Pertinent Vitals/Pain Pain Assessment: No/denies pain    Home Living                      Prior Function            PT Goals (current goals can now be found in the care plan section) Acute Rehab PT Goals Patient Stated Goal: to get stronger before going home so I can take care of myself and the cooking/cleaning since my husband is not in good health; likes to mow the lawn in the summer PT Goal Formulation: With patient Time For Goal Achievement: 05/20/15 Potential to Achieve Goals: Good Progress towards PT goals: Progressing toward goals    Frequency  Min 3X/week    PT Plan Discharge plan needs to be updated (PT now recommending home with HHPT, pt agrees)    Co-evaluation             End of Session Equipment Utilized During Treatment: Gait belt Activity Tolerance: Patient tolerated treatment well Patient left: in chair;with call bell/phone within reach;with chair alarm set     Time: [id number] PT Time Calculation (min) (ACUTE  ONLY): 18 min  Charges:  $Gait Training: 8-22 mins                    G Codes:      [name] 05/08/2015, 10:24 AM [phone]

## 2015-05-08 NOTE — Progress Notes (Signed)
[facility] called to deliver rolling walker to room today prior to discharging home today. Mindi Slicker [facility] [phone]

## 2015-05-08 NOTE — Progress Notes (Signed)
Pt has orders to be discharged. Discharge instructions given and pt has no additional questions at this time. Medication regimen reviewed and pt educated. Prescriptions given. Pt verbalized understanding and has no additional questions. Telemetry box removed. IV removed and site in good condition. Pt stable and waiting for transportation.

## 2015-05-08 NOTE — Discharge Summary (Signed)
Physician Discharge Summary  Sophia [name] [id number] DOB: [birth year] DOA: 04/29/2015  PCP: Claretta Fraise, MD  Admit date: 04/29/2015 Discharge date: 05/08/2015  Recommendations for Outpatient Follow-up:  1. Pt will need to follow up with PCP in 2 weeks post discharge 2. Please obtain BMP and CBC on 05/13/15  Discharge Diagnoses:  #1 upper GI bleed secondary to duodenal ulcer and gastroduodenal artery status post upper endoscopy 04/29/2015 showing a duodenal ulcer. Patient with continuous bleeding initially and status post IR embolization of gastroduodenal artery. Dr. Jarvis Newcomer 04/30/2015. Patient is status post 11 units packed red blood cells since admission, 3 units FFP, 1 unit for pheresed platelets. Hemoglobin still trended down and currently at 9.9 from 9.4 from 9.7 from 9.4 from 6.7. Blood pressure has improved. Patient with brown stool yesterday morning and had some tarry stools 2-3 days ago. Hemoglobin seems to have stabilized at 9.9. Patient alert and following commands. Patient denies any abdominal pain. Continue oral Protonix twice daily. Follow H&H. Transfusion threshold hemoglobin less than 7. Tolerating soft diet. Per general surgery if patient was to become hemodynamically unstable with continued bleeding may require surgery however will treat conservatively for now and follow. General surgery and GI have signed off. Patient will likely need to follow-up with GI as an outpatient post discharge in 3-4 weeks.   #2 acute blood loss anemia Secondary to problem #1. Status post 11 units packed red blood cells. Hemoglobin currently at 9.9 from 9.4 from 9.7 from 9.4 from 6.7.  Patient with no further tarry stools.  -Patient's hemoglobin has remained stable for over 5 days prior to discharge  -No further melanotic stools or hematochezia -05/07/2015 patient had brown stool -Hemoglobin 10.1 on the day of discharge  #3 hemorrhagic shock Secondary to problem #1. Blood pressure  improved. Off pressors. Monitor closely. -blood pressure has remained stable for several days prior to discharge  #4 status post vent dependent respiratory failure/acute respiratory failure with hypoxia  On admission patient was intubated and placed on the vent for airway protection. Patient is extubated. Patient protecting airway. -Patient has remained stable on room air for several days  #5 acute on chronic kidney disease stage III Likely secondary to prerenal azotemia secondary to hemorrhagic shock vs volume overload. Patient received blood products and has had 11 units of PRBcs. UA Nitrite negative leukocytes negative. Urinalysis negative for proteinuria or glucose.  - serum creatinine back to baseline prior to discharge  -Serum creatinine 1.49 on the day of discharge  -Baseline creatinine 1.3-1.5  -will not restart valsartan/HCTZ in setting of recovering renal failure and CKD  #6 hypoxia Likely secondary to volume overload from significant blood products and aggressive fluid resuscitation on arrival S patient was in hemorrhagic shock. Patient status post 11 units packed red blood cells. Crackles noted on lung examination 3 days ago. Chest x-ray negative. Improved. Patient with sats of 96-98% on room air.  #7 hypokalemia/hypernatremia Repleted potassium. Hypernatremia improved. Follow.  #8 leukocytosis Likely a stress  demargination and secondary to Escherichia coli UTI. Chest x-ray is negative for any acute infiltrate. Urinalysis is negative. Urine cultures with greater than 100,000 Escherichia coli.  -Patient initially placed on ceftriaxone, and then transitioned to oral cefuroxime  -Patient received 3 days antibiotics--will not discharge on any further antibiotics at this time   #9 elevated troponin/probable demand ischemia Troponins were elevated likely secondary to admission hemoglobin of 4. Patient denies any active chest pain. EKG with normal sinus rhythm with no signs of  ischemia. 2-D echo with  EF of 60-65% with no wall motion abnormalities with grade 1 diastolic dysfunction. Monitor closely for volume overload. Patient is being followed by cardiology and treatment limited by inability to give anticoagulation due to current bleed. Continue low-dose Coreg as hypotension has improved. Patient for outpatient stress test per cardiology recommendations.  #10 hypothyroidism TSH at 6.292. Continue oral Synthroid 37.5 MCG's daily.  -Recheck TSH in one month  #11 diabetes mellitus Not on any outpatient medications. Hemoglobin A1c = 6. CBGs RANGED FROM 101-157. Sliding scale insulin. -Given the patient's age, allow for liberal glycemic control  -Will not discharge him on any hypoglycemic agents   #12 EColi Bacteruria Change IV Rocephin to oral Ceftin to complete course of antibiotic therapy. -Patient will have had 3 days of antibiotics   [id number] acute metabolic encephalopathy secondary to sedation Resolved. -Mentation back to baseline   Discharge Condition: stable  Disposition: SNF  Diet:cardiac/heart healthy Wt Readings from Last 3 Encounters:  05/08/15 66.6 kg (146 lb 13.2 oz)  04/26/15 58.242 kg (128 lb 6.4 oz)  12/03/14 61.508 kg (135 lb 9.6 oz)    History of present illness:   Sophia [name] is an 81 y.o. F with PMH as outlined below. She was brought to [facility] ED 12/12 due to near syncopal episode along with melena. She did fall and suffered a laceration to her forehead.   On arrival to ED, she was apparently covered in melena and blood. Once in ED stretcher, she began to have active hematemesis. SBP was in the 70's and Hgb 6.5 (was 11.0 just 3 days prior). She was given 4L IVF and had 4u PRBC ordered. GI was called and planning for bedside endoscopy in ED. Patient was admitted to ICU under PCCM intubated for airway protection. Patient subsequently placed on pressors and had a bedside upper endoscopy that showed a duodenal ulcer that was still  gushing out blood. Patient received a total of 11 units packed red blood cells 3 units FFP and a unit of pheresis platelets. Patient continued to bleed general surgery was consulted patient underwent IR embolization of gastroduodenal artery per Dr. Jarvis Newcomer 04/30/2015. Patient stabilized improved transfer to the step down unit. Patient has remained stable and improved clinically. Patient also noted to have a demand ischemia being followed by cardiology and needs outpatient stress test. Patient also noted to have a Escherichia coli UTI place on IV antibiotics and transitioned to oral antibiotics.   Consultants: CCM Cardiology GI General surgery IR Discharge Exam: Filed Vitals:   05/07/15 1952 05/08/15 0421  BP: 151/78 154/76  Pulse: 81 72  Temp: 99.5 F (37.5 C) 98.6 F (37 C)  Resp: 18 18   Filed Vitals:   05/07/15 0839 05/07/15 1108 05/07/15 1952 05/08/15 0421  BP: 143/65 134/50 151/78 154/76  Pulse: 76 79 81 72  Temp:  98.6 F (37 C) 99.5 F (37.5 C) 98.6 F (37 C)  TempSrc:  Oral Oral Oral  Resp:  19 18 18   Height:      Weight:    66.6 kg (146 lb 13.2 oz)  SpO2:  96% 98% 95%   General: A&O x 3, NAD, pleasant, cooperative Cardiovascular: RRR, no rub, no gallop, no S3 Respiratory: Bibasilar crackles. No wheezes. Good air movement Abdomen:soft, nontender, nondistended, positive bowel sounds Extremities: 1+LE edema, No lymphangitis, no petechiae  Discharge Instructions      Discharge Instructions    Diet - low sodium heart healthy    Complete by:  As directed  Increase activity slowly    Complete by:  As directed             Medication List    STOP taking these medications        valsartan-hydrochlorothiazide 320-25 MG tablet  Commonly known as:  DIOVAN-HCT      TAKE these medications        aspirin 81 MG tablet  Take 81 mg by mouth daily.     Calcium-Magnesium 500-250 MG Tabs  Take 1 tablet by mouth daily as needed (takes once in while).      carvedilol 3.125 MG tablet  Commonly known as:  COREG  Take 1 tablet (3.125 mg total) by mouth 2 (two) times daily with a meal.     Co Q 10 100 MG Caps  Take 100 mg by mouth daily as needed (takes once in while).     hyoscyamine 0.125 MG SL tablet  Commonly known as:  LEVSIN/SL  Place 1 tablet (0.125 mg total) under the tongue every 4 (four) hours as needed. For colon spasm     levothyroxine 75 MCG tablet  Commonly known as:  SYNTHROID, LEVOTHROID  Take 0.5 tablets (37.5 mcg total) by mouth daily before breakfast.     multivitamin with minerals tablet  Take 1 tablet by mouth daily.     neomycin-polymyxin b-dexamethasone 3.5-10000-0.1 Oint  Commonly known as:  MAXITROL  Place 1 application into the right eye at bedtime.     Omega 3 1200 MG Caps  Take 1,200 mg by mouth daily as needed (takes once in while).     omeprazole 40 MG capsule  Commonly known as:  PRILOSEC  Take 1 capsule (40 mg total) by mouth 2 (two) times daily before a meal. Take on an empty stomach     simvastatin 40 MG tablet  Commonly known as:  ZOCOR  Take 0.5 tablets (20 mg total) by mouth daily.         The results of significant diagnostics from this hospitalization (including imaging, microbiology, ancillary and laboratory) are listed below for reference.    Significant Diagnostic Studies: Ir Angiogram Visceral Selective  04/30/2015  CLINICAL DATA:  Large volume upper GI bleed. Endoscopy revealed probable duodenal ulcer has active source, incompletely controlled. EXAM: IR EMBO ART VEN HEMORR LYMPH EXTRAV INC GUIDE ROADMAPPING; IR ULTRASOUND GUIDANCE VASC ACCESS LEFT; ARTERIOGRAPHY; SELECTIVE VISCERAL ARTERIOGRAPHY; ADDITIONAL ARTERIOGRAPHY ANESTHESIA/SEDATION: Intravenous Fentanyl and Versed had been administered for previous procedure. No additional medications were administered. Radiology RN provided continuous cardiorespiratory monitoring. MEDICATIONS: Lidocaine 1% subcutaneous 5 mL CONTRAST:  70mL  OMNIPAQUE IOHEXOL 300 MG/ML  SOLN PROCEDURE: The procedure, risks (including but not limited to bleeding, infection, organ damage ), benefits, and alternatives were explained to the spouse and daughter. Questions regarding the procedure were encouraged and answered. The family understands and consents to the procedure. Because of a indwelling right femoral venous catheter, a left-sided femoral approach was selected. Site was marked, prepped with Betadine, draped in usual sterile fashion, infiltrated locally with 1% lidocaine. Under real-time ultrasound guidance, the left femoral artery was accessed with a 21-gauge micropuncture needle, exchanged over a 018 guidewire for a transitional dilator, through which a 035 guidewire was advanced. A 5 French vascular sheath was placed. Through this a 5 French C2 catheter was coaxial advanced in used to selectively catheterize the celiac axis for selective arteriography. An angled Glidewire was advanced into the gastroduodenal artery but the C2 catheter could not be adequately advanced distally. A coaxial  renegade micro catheter with transient guidewire was then advanced through the C2 and used to selectively catheterize the gastroduodenal artery. Coil embolization of the GDA performed with 3-5 mm coils the cessation of flow. After final arteriography through the C2 catheter, the catheter and sheath were removed and hemostasis achieved with the aid of the Exoseal device after confirmatory femoral arteriography. The patient tolerated the procedure well. COMPLICATIONS: None immediate FINDINGS: Brisk bleeding from a distal branch of the gastroduodenal artery was evident on the initial arteriogram, into what is probably the lumen of the duodenum. The vessel was selectively catheterized and coil embolization performed until cessation of flow through the treated segment and no further extravasation evident. There is elsewhere diffuse arterial vasospasm. No other pathologic regions  identified. IMPRESSION: 1. Brisk active arterial extravasation from a branch of the gastroduodenal artery probably into the duodenal lumen. 2. Technically successful coil embolization of the gastroduodenal artery with cessation of extravasation. Electronically Signed   By: Lucrezia Europe M.D.   On: 04/30/2015 12:08   Ir Angiogram Selective Each Additional Vessel  04/30/2015  CLINICAL DATA:  Large volume upper GI bleed. Endoscopy revealed probable duodenal ulcer has active source, incompletely controlled. EXAM: IR EMBO ART VEN HEMORR LYMPH EXTRAV INC GUIDE ROADMAPPING; IR ULTRASOUND GUIDANCE VASC ACCESS LEFT; ARTERIOGRAPHY; SELECTIVE VISCERAL ARTERIOGRAPHY; ADDITIONAL ARTERIOGRAPHY ANESTHESIA/SEDATION: Intravenous Fentanyl and Versed had been administered for previous procedure. No additional medications were administered. Radiology RN provided continuous cardiorespiratory monitoring. MEDICATIONS: Lidocaine 1% subcutaneous 5 mL CONTRAST:  36mL OMNIPAQUE IOHEXOL 300 MG/ML  SOLN PROCEDURE: The procedure, risks (including but not limited to bleeding, infection, organ damage ), benefits, and alternatives were explained to the spouse and daughter. Questions regarding the procedure were encouraged and answered. The family understands and consents to the procedure. Because of a indwelling right femoral venous catheter, a left-sided femoral approach was selected. Site was marked, prepped with Betadine, draped in usual sterile fashion, infiltrated locally with 1% lidocaine. Under real-time ultrasound guidance, the left femoral artery was accessed with a 21-gauge micropuncture needle, exchanged over a 018 guidewire for a transitional dilator, through which a 035 guidewire was advanced. A 5 French vascular sheath was placed. Through this a 5 French C2 catheter was coaxial advanced in used to selectively catheterize the celiac axis for selective arteriography. An angled Glidewire was advanced into the gastroduodenal artery but  the C2 catheter could not be adequately advanced distally. A coaxial renegade micro catheter with transient guidewire was then advanced through the C2 and used to selectively catheterize the gastroduodenal artery. Coil embolization of the GDA performed with 3-5 mm coils the cessation of flow. After final arteriography through the C2 catheter, the catheter and sheath were removed and hemostasis achieved with the aid of the Exoseal device after confirmatory femoral arteriography. The patient tolerated the procedure well. COMPLICATIONS: None immediate FINDINGS: Brisk bleeding from a distal branch of the gastroduodenal artery was evident on the initial arteriogram, into what is probably the lumen of the duodenum. The vessel was selectively catheterized and coil embolization performed until cessation of flow through the treated segment and no further extravasation evident. There is elsewhere diffuse arterial vasospasm. No other pathologic regions identified. IMPRESSION: 1. Brisk active arterial extravasation from a branch of the gastroduodenal artery probably into the duodenal lumen. 2. Technically successful coil embolization of the gastroduodenal artery with cessation of extravasation. Electronically Signed   By: Lucrezia Europe M.D.   On: 04/30/2015 12:08   Ir Angiogram Follow Up Study  04/30/2015  CLINICAL  DATA:  Large volume upper GI bleed. Endoscopy revealed probable duodenal ulcer has active source, incompletely controlled. EXAM: IR EMBO ART VEN HEMORR LYMPH EXTRAV INC GUIDE ROADMAPPING; IR ULTRASOUND GUIDANCE VASC ACCESS LEFT; ARTERIOGRAPHY; SELECTIVE VISCERAL ARTERIOGRAPHY; ADDITIONAL ARTERIOGRAPHY ANESTHESIA/SEDATION: Intravenous Fentanyl and Versed had been administered for previous procedure. No additional medications were administered. Radiology RN provided continuous cardiorespiratory monitoring. MEDICATIONS: Lidocaine 1% subcutaneous 5 mL CONTRAST:  75mL OMNIPAQUE IOHEXOL 300 MG/ML  SOLN PROCEDURE: The  procedure, risks (including but not limited to bleeding, infection, organ damage ), benefits, and alternatives were explained to the spouse and daughter. Questions regarding the procedure were encouraged and answered. The family understands and consents to the procedure. Because of a indwelling right femoral venous catheter, a left-sided femoral approach was selected. Site was marked, prepped with Betadine, draped in usual sterile fashion, infiltrated locally with 1% lidocaine. Under real-time ultrasound guidance, the left femoral artery was accessed with a 21-gauge micropuncture needle, exchanged over a 018 guidewire for a transitional dilator, through which a 035 guidewire was advanced. A 5 French vascular sheath was placed. Through this a 5 French C2 catheter was coaxial advanced in used to selectively catheterize the celiac axis for selective arteriography. An angled Glidewire was advanced into the gastroduodenal artery but the C2 catheter could not be adequately advanced distally. A coaxial renegade micro catheter with transient guidewire was then advanced through the C2 and used to selectively catheterize the gastroduodenal artery. Coil embolization of the GDA performed with 3-5 mm coils the cessation of flow. After final arteriography through the C2 catheter, the catheter and sheath were removed and hemostasis achieved with the aid of the Exoseal device after confirmatory femoral arteriography. The patient tolerated the procedure well. COMPLICATIONS: None immediate FINDINGS: Brisk bleeding from a distal branch of the gastroduodenal artery was evident on the initial arteriogram, into what is probably the lumen of the duodenum. The vessel was selectively catheterized and coil embolization performed until cessation of flow through the treated segment and no further extravasation evident. There is elsewhere diffuse arterial vasospasm. No other pathologic regions identified. IMPRESSION: 1. Brisk active arterial  extravasation from a branch of the gastroduodenal artery probably into the duodenal lumen. 2. Technically successful coil embolization of the gastroduodenal artery with cessation of extravasation. Electronically Signed   By: Lucrezia Europe M.D.   On: 04/30/2015 12:08   Ir US Guide Vasc Access Left  04/30/2015  CLINICAL DATA:  Large volume upper GI bleed. Endoscopy revealed probable duodenal ulcer has active source, incompletely controlled. EXAM: IR EMBO ART VEN HEMORR LYMPH EXTRAV INC GUIDE ROADMAPPING; IR ULTRASOUND GUIDANCE VASC ACCESS LEFT; ARTERIOGRAPHY; SELECTIVE VISCERAL ARTERIOGRAPHY; ADDITIONAL ARTERIOGRAPHY ANESTHESIA/SEDATION: Intravenous Fentanyl and Versed had been administered for previous procedure. No additional medications were administered. Radiology RN provided continuous cardiorespiratory monitoring. MEDICATIONS: Lidocaine 1% subcutaneous 5 mL CONTRAST:  37mL OMNIPAQUE IOHEXOL 300 MG/ML  SOLN PROCEDURE: The procedure, risks (including but not limited to bleeding, infection, organ damage ), benefits, and alternatives were explained to the spouse and daughter. Questions regarding the procedure were encouraged and answered. The family understands and consents to the procedure. Because of a indwelling right femoral venous catheter, a left-sided femoral approach was selected. Site was marked, prepped with Betadine, draped in usual sterile fashion, infiltrated locally with 1% lidocaine. Under real-time ultrasound guidance, the left femoral artery was accessed with a 21-gauge micropuncture needle, exchanged over a 018 guidewire for a transitional dilator, through which a 035 guidewire was advanced. A 5 French vascular sheath was placed.  Through this a 5 French C2 catheter was coaxial advanced in used to selectively catheterize the celiac axis for selective arteriography. An angled Glidewire was advanced into the gastroduodenal artery but the C2 catheter could not be adequately advanced distally. A  coaxial renegade micro catheter with transient guidewire was then advanced through the C2 and used to selectively catheterize the gastroduodenal artery. Coil embolization of the GDA performed with 3-5 mm coils the cessation of flow. After final arteriography through the C2 catheter, the catheter and sheath were removed and hemostasis achieved with the aid of the Exoseal device after confirmatory femoral arteriography. The patient tolerated the procedure well. COMPLICATIONS: None immediate FINDINGS: Brisk bleeding from a distal branch of the gastroduodenal artery was evident on the initial arteriogram, into what is probably the lumen of the duodenum. The vessel was selectively catheterized and coil embolization performed until cessation of flow through the treated segment and no further extravasation evident. There is elsewhere diffuse arterial vasospasm. No other pathologic regions identified. IMPRESSION: 1. Brisk active arterial extravasation from a branch of the gastroduodenal artery probably into the duodenal lumen. 2. Technically successful coil embolization of the gastroduodenal artery with cessation of extravasation. Electronically Signed   By: Lucrezia Europe M.D.   On: 04/30/2015 12:08   Dg Chest Port 1 View  05/04/2015  CLINICAL DATA:  Hypoxia EXAM: PORTABLE CHEST - 1 VIEW COMPARISON:  05/03/2015 FINDINGS: Cardiac shadow is stable. Persistent left pleural effusion and basilar infiltrate are seen. Left jugular central line is again noted in the proximal superior vena cava. IMPRESSION: Persistent left basilar changes.  No new focal abnormality is seen. Electronically Signed   By: Inez Catalina M.D.   On: 05/04/2015 11:48   Dg Chest Port 1 View  05/03/2015  CLINICAL DATA:  Respiratory failure EXAM: PORTABLE CHEST 1 VIEW COMPARISON:  Portable chest x-ray of May 01, 2015 FINDINGS: The lungs are adequately inflated. There is persistent basilar atelectasis or pneumonia and small pleural effusion. The heart  is top-normal in size. The pulmonary vascularity is mildly prominent centrally. There is a left internal jugular venous catheter in place whose tip projects over the midportion of the SVC. The trachea and esophagus have been extubated. IMPRESSION: Stable appearance of the chest postextubation. Persistent left lower lobe atelectasis -pneumonia with small left pleural effusion. Electronically Signed   By: Johnryan Sao  [place] M.D.   On: 05/03/2015 07:36   Dg Chest Port 1 View  05/01/2015  CLINICAL DATA:  Respiratory failure EXAM: PORTABLE CHEST 1 VIEW COMPARISON:  05/01/2015 FINDINGS: Stable endotracheal and NG tube position. There is left IJ central line with tip in SVC. No pneumothorax. Persistent small left pleural effusion and left basilar atelectasis or infiltrate. No pulmonary edema. IMPRESSION: Stable support apparatus. Left IJ central line in place. Persistent small left pleural effusion with left basilar atelectasis or infiltrate. Electronically Signed   By: Lahoma Crocker M.D.   On: 05/01/2015 14:28   Dg Chest Port 1 View  05/01/2015  CLINICAL DATA:  Endotracheal tube placement EXAM: PORTABLE CHEST 1 VIEW COMPARISON:  04/29/2015 FINDINGS: Grossly unchanged enlarged cardiac silhouette and mediastinal contours with atherosclerotic plaque within the thoracic aorta. Endotracheal tube overlies tracheal air column with tip approximately 1.7 cm above the carina. Enteric tube tip and side port project below the left hemidiaphragm. Worsening bibasilar heterogeneous/consolidative opacities, left greater than right. No definite pleural effusion. No pneumothorax. No definite evidence of edema. Unchanged bones. IMPRESSION: 1. Appropriately positioned support apparatus as above. No pneumothorax. 2. Worsening  bibasilar heterogeneous opacities, left greater than right, atelectasis versus infiltrate. 3. No evidence of edema. Electronically Signed   By: Sandi Mariscal M.D.   On: 05/01/2015 07:22   Dg Chest Port 1  View  04/29/2015  CLINICAL DATA:  Post intubation. EXAM: PORTABLE CHEST 1 VIEW COMPARISON:  04/29/2015 FINDINGS: Interval placement of an endotracheal tube with tip measuring 2.9 cm above the carina. Shallow inspiration. Normal heart size and pulmonary vascularity. Lungs appear clear. No blunting of costophrenic angles. No pneumothorax. Tortuous aorta. Surgical clips in the right upper quadrant. IMPRESSION: Endotracheal tube tip measures 2.9 cm above the carina. Shallow inspiration. No evidence of active pulmonary disease. Electronically Signed   By: Lucienne Capers M.D.   On: 04/29/2015 22:52   Dg Abd Portable 1v  04/29/2015  CLINICAL DATA:  81 year old female with bloody stool and vomiting blood EXAM: PORTABLE ABDOMEN - 1 VIEW COMPARISON:  None. FINDINGS: Single upright radiograph demonstrates gas within the stomach. No free air identified. External defibrillator pads project over the left chest and right upper quadrant. The heart is likely mildly enlarged. The lungs appear clear. No acute osseous abnormality. IMPRESSION: 1. No free air visualized. 2. Mild cardiomegaly. 3. The lungs appear clear. Electronically Signed   By: Jacqulynn Cadet M.D.   On: 04/29/2015 21:43   [place] Guide Roadmapping  04/30/2015  CLINICAL DATA:  Large volume upper GI bleed. Endoscopy revealed probable duodenal ulcer has active source, incompletely controlled. EXAM: IR EMBO ART VEN HEMORR LYMPH EXTRAV INC GUIDE ROADMAPPING; IR ULTRASOUND GUIDANCE VASC ACCESS LEFT; ARTERIOGRAPHY; SELECTIVE VISCERAL ARTERIOGRAPHY; ADDITIONAL ARTERIOGRAPHY ANESTHESIA/SEDATION: Intravenous Fentanyl and Versed had been administered for previous procedure. No additional medications were administered. Radiology RN provided continuous cardiorespiratory monitoring. MEDICATIONS: Lidocaine 1% subcutaneous 5 mL CONTRAST:  63mL OMNIPAQUE IOHEXOL 300 MG/ML  SOLN PROCEDURE: The procedure, risks (including but not limited  to bleeding, infection, organ damage ), benefits, and alternatives were explained to the spouse and daughter. Questions regarding the procedure were encouraged and answered. The family understands and consents to the procedure. Because of a indwelling right femoral venous catheter, a left-sided femoral approach was selected. Site was marked, prepped with Betadine, draped in usual sterile fashion, infiltrated locally with 1% lidocaine. Under real-time ultrasound guidance, the left femoral artery was accessed with a 21-gauge micropuncture needle, exchanged over a 018 guidewire for a transitional dilator, through which a 035 guidewire was advanced. A 5 French vascular sheath was placed. Through this a 5 French C2 catheter was coaxial advanced in used to selectively catheterize the celiac axis for selective arteriography. An angled Glidewire was advanced into the gastroduodenal artery but the C2 catheter could not be adequately advanced distally. A coaxial renegade micro catheter with transient guidewire was then advanced through the C2 and used to selectively catheterize the gastroduodenal artery. Coil embolization of the GDA performed with 3-5 mm coils the cessation of flow. After final arteriography through the C2 catheter, the catheter and sheath were removed and hemostasis achieved with the aid of the Exoseal device after confirmatory femoral arteriography. The patient tolerated the procedure well. COMPLICATIONS: None immediate FINDINGS: Brisk bleeding from a distal branch of the gastroduodenal artery was evident on the initial arteriogram, into what is probably the lumen of the duodenum. The vessel was selectively catheterized and coil embolization performed until cessation of flow through the treated segment and no further extravasation evident. There is elsewhere diffuse arterial vasospasm. No other pathologic regions identified. IMPRESSION: 1.  Brisk active arterial extravasation from a branch of the  gastroduodenal artery probably into the duodenal lumen. 2. Technically successful coil embolization of the gastroduodenal artery with cessation of extravasation. Electronically Signed   By: Lucrezia Europe M.D.   On: 04/30/2015 12:08     Microbiology: Recent Results (from the past 240 hour(s))  MRSA PCR Screening     Status: None   Collection Time: 04/30/15  2:24 AM  Result Value Ref Range Status   MRSA by PCR NEGATIVE NEGATIVE Final    Comment:        The GeneXpert MRSA Assay (FDA approved for NASAL specimens only), is one component of a comprehensive MRSA colonization surveillance program. It is not intended to diagnose MRSA infection nor to guide or monitor treatment for MRSA infections.   Culture, Urine     Status: None   Collection Time: 05/04/15 12:00 PM  Result Value Ref Range Status   Specimen Description URINE, CATHETERIZED  Final   Special Requests NONE  Final   Culture >=100,000 COLONIES/mL ESCHERICHIA COLI  Final   Report Status 05/06/2015 FINAL  Final   Organism ID, Bacteria ESCHERICHIA COLI  Final      Susceptibility   Escherichia coli - MIC*    AMPICILLIN <=2 SENSITIVE Sensitive     CEFAZOLIN <=4 SENSITIVE Sensitive     CEFTRIAXONE <=1 SENSITIVE Sensitive     CIPROFLOXACIN <=0.25 SENSITIVE Sensitive     GENTAMICIN <=1 SENSITIVE Sensitive     IMIPENEM <=0.25 SENSITIVE Sensitive     NITROFURANTOIN <=16 SENSITIVE Sensitive     TRIMETH/SULFA <=20 SENSITIVE Sensitive     AMPICILLIN/SULBACTAM <=2 SENSITIVE Sensitive     PIP/TAZO <=4 SENSITIVE Sensitive     * >=100,000 COLONIES/mL ESCHERICHIA COLI     Labs: Basic Metabolic Panel:  Recent Labs Lab 05/02/15 0425 05/03/15 0258 05/04/15 0454 05/05/15 0320 05/06/15 0450 05/07/15 0744 05/08/15 0350  NA 148* 147* 147* 145 144 142 142  K 3.5 3.4* 3.4* 3.5 3.3* 3.5 3.6  CL 117* 118* 116* 116* 111 109 109  CO2 25 23 25 25 27 25 25   GLUCOSE 107* 110* 107* 116* 101* 107* 109*  BUN 46* 51* 47* 39* 31* 23* 19   CREATININE 2.59* 2.41* 2.14* 1.94* 1.67* 1.57* 1.49*  CALCIUM 6.5* 6.7* 7.3* 7.4* 7.4* 7.7* 7.7*  MG 1.7 1.8 2.2 1.9 1.7 2.0 1.7  PHOS 2.7 2.5  --   --   --   --   --    Liver Function Tests: No results for input(s): AST, ALT, ALKPHOS, BILITOT, PROT, ALBUMIN in the last 168 hours. No results for input(s): LIPASE, AMYLASE in the last 168 hours. No results for input(s): AMMONIA in the last 168 hours. CBC:  Recent Labs Lab 05/02/15 0425  05/03/15 0258  05/04/15 0454 05/04/15 1400 05/05/15 0320 05/06/15 0450 05/07/15 0744 05/08/15 0350  WBC 19.2*  --  18.3*  --  14.6*  --  13.7* 10.9* 10.3 11.4*  NEUTROABS 16.6*  --  15.5*  --  11.4*  --  10.4*  --  7.7  --   HGB 7.5*  < > 6.7*  < > 9.4* 10.6* 9.7* 9.4* 9.9* 10.1*  HCT 22.3*  < > 19.5*  < > 27.8* 31.7* 28.9* 29.6* 30.4* 30.3*  MCV 84.5  --  84.8  --  84.2  --  85.8 88.1 88.1 87.8  PLT 95*  --  110*  --  98*  --  125* 162 224 261  < > =  values in this interval not displayed. Cardiac Enzymes: No results for input(s): CKTOTAL, CKMB, CKMBINDEX, TROPONINI in the last 168 hours. BNP: Invalid input(s): POCBNP CBG:  Recent Labs Lab 05/06/15 2107 05/07/15 0539 05/07/15 1112 05/07/15 1632 05/07/15 2238  GLUCAP 160* 101* 139* 157* 105*    Time coordinating discharge:  Greater than 30 minutes  Signed:  Treyvonne Tata, DO [facility] Pager: [phone] 05/08/2015, 7:34 AM

## 2015-05-08 NOTE — Progress Notes (Signed)
CSW found bed at [facility] for pt DC today  PT worked with pt and said pt now appropriate for home health- pt is in agreement for return home and will have grand daughter available to assist at home as well as her spouse.  CSW informed [name] of change in disposition  CSW signing off  Domenica Reamer, [place] Social Worker [phone]

## 2015-05-10 DIAGNOSIS — D62 Acute posthemorrhagic anemia: Secondary | ICD-10-CM | POA: Diagnosis not present

## 2015-05-10 DIAGNOSIS — K260 Acute duodenal ulcer with hemorrhage: Secondary | ICD-10-CM | POA: Diagnosis not present

## 2015-05-10 DIAGNOSIS — N179 Acute kidney failure, unspecified: Secondary | ICD-10-CM | POA: Diagnosis not present

## 2015-05-10 DIAGNOSIS — Z87440 Personal history of urinary (tract) infections: Secondary | ICD-10-CM | POA: Diagnosis not present

## 2015-05-10 DIAGNOSIS — N189 Chronic kidney disease, unspecified: Secondary | ICD-10-CM | POA: Diagnosis not present

## 2015-05-13 DIAGNOSIS — K260 Acute duodenal ulcer with hemorrhage: Secondary | ICD-10-CM | POA: Diagnosis not present

## 2015-05-13 DIAGNOSIS — N179 Acute kidney failure, unspecified: Secondary | ICD-10-CM | POA: Diagnosis not present

## 2015-05-13 DIAGNOSIS — N189 Chronic kidney disease, unspecified: Secondary | ICD-10-CM | POA: Diagnosis not present

## 2015-05-13 DIAGNOSIS — D62 Acute posthemorrhagic anemia: Secondary | ICD-10-CM | POA: Diagnosis not present

## 2015-05-13 DIAGNOSIS — Z87440 Personal history of urinary (tract) infections: Secondary | ICD-10-CM | POA: Diagnosis not present

## 2015-05-15 ENCOUNTER — Encounter: Payer: Medicare Other | Admitting: Family Medicine

## 2015-05-15 NOTE — Progress Notes (Deleted)
Subjective:  Patient ID: Sophia [name], female    DOB: [birth year]  Age: 81 y.o. MRN: [id number]  CC: No chief complaint on file.   HPI Sophia [name] presents for ***  History Sophia [name] has a past medical history of Hypertension; Hyperlipidemia; Diabetes mellitus without complication ([place]); and Chronic kidney disease.   She has past surgical history that includes Cholecystectomy; Sigmoidoscopy; and Esophagogastroduodenoscopy (N/A, 04/29/2015).   Her family history includes Colon cancer in her sister; Heart disease in her brother, father, and sister.She reports that she has never smoked. She has never used smokeless tobacco. She reports that she does not drink alcohol or use illicit drugs.  Outpatient Prescriptions Prior to Visit  Medication Sig Dispense Refill  . aspirin 81 MG tablet Take 81 mg by mouth daily.    . Calcium-Magnesium 500-250 MG TABS Take 1 tablet by mouth daily as needed (takes once in while).     . carvedilol (COREG) 3.125 MG tablet Take 1 tablet (3.125 mg total) by mouth 2 (two) times daily with a meal. 60 tablet 1  . Coenzyme Q10 (CO Q 10) 100 MG CAPS Take 100 mg by mouth daily as needed (takes once in while).     . hyoscyamine (LEVSIN/SL) 0.125 MG SL tablet Place 1 tablet (0.125 mg total) under the tongue every 4 (four) hours as needed. For colon spasm 30 tablet 2  . levothyroxine (SYNTHROID, LEVOTHROID) 75 MCG tablet Take 0.5 tablets (37.5 mcg total) by mouth daily before breakfast. 30 tablet 0  . Multiple Vitamins-Minerals (MULTIVITAMIN WITH MINERALS) tablet Take 1 tablet by mouth daily.    Marland Kitchen neomycin-polymyxin b-dexamethasone (MAXITROL) 3.5-10000-0.1 OINT Place 1 application into the right eye at bedtime.     . Omega 3 1200 MG CAPS Take 1,200 mg by mouth daily as needed (takes once in while).     Marland Kitchen omeprazole (PRILOSEC) 40 MG capsule Take 1 capsule (40 mg total) by mouth 2 (two) times daily before a meal. Take on an empty stomach 60 capsule 1  .  simvastatin (ZOCOR) 40 MG tablet Take 0.5 tablets (20 mg total) by mouth daily. 90 tablet 1   No facility-administered medications prior to visit.    ROS Review of Systems  Objective:  There were no vitals taken for this visit.  BP Readings from Last 3 Encounters:  05/08/15 154/76  04/26/15 151/78  03/18/15 138/66    Wt Readings from Last 3 Encounters:  05/08/15 146 lb 13.2 oz (66.6 kg)  04/26/15 128 lb 6.4 oz (58.242 kg)  12/03/14 135 lb 9.6 oz (61.508 kg)     Physical Exam   Lab Results  Component Value Date   WBC 11.4* 05/08/2015   HGB 10.1* 05/08/2015   HCT 30.3* 05/08/2015   PLT 261 05/08/2015   GLUCOSE 109* 05/08/2015   CHOL 156 11/26/2014   TRIG 129 11/26/2014   HDL 57 11/26/2014   LDLCALC 73 11/26/2014   ALT 15 04/29/2015   AST 24 04/29/2015   NA 142 05/08/2015   K 3.6 05/08/2015   CL 109 05/08/2015   CREATININE 1.49* 05/08/2015   BUN 19 05/08/2015   CO2 25 05/08/2015   TSH 6.292* 05/03/2015   INR 1.58* 05/03/2015   HGBA1C 6.0* 05/03/2015    Ir Angiogram Visceral Selective  04/30/2015  CLINICAL DATA:  Large volume upper GI bleed. Endoscopy revealed probable duodenal ulcer has active source, incompletely controlled. EXAM: IR EMBO ART VEN HEMORR LYMPH EXTRAV INC GUIDE ROADMAPPING; IR ULTRASOUND GUIDANCE VASC ACCESS LEFT;  ARTERIOGRAPHY; SELECTIVE VISCERAL ARTERIOGRAPHY; ADDITIONAL ARTERIOGRAPHY ANESTHESIA/SEDATION: Intravenous Fentanyl and Versed had been administered for previous procedure. No additional medications were administered. Radiology RN provided continuous cardiorespiratory monitoring. MEDICATIONS: Lidocaine 1% subcutaneous 5 mL CONTRAST:  16mL OMNIPAQUE IOHEXOL 300 MG/ML  SOLN PROCEDURE: The procedure, risks (including but not limited to bleeding, infection, organ damage ), benefits, and alternatives were explained to the spouse and daughter. Questions regarding the procedure were encouraged and answered. The family understands and consents to  the procedure. Because of a indwelling right femoral venous catheter, a left-sided femoral approach was selected. Site was marked, prepped with Betadine, draped in usual sterile fashion, infiltrated locally with 1% lidocaine. Under real-time ultrasound guidance, the left femoral artery was accessed with a 21-gauge micropuncture needle, exchanged over a 018 guidewire for a transitional dilator, through which a 035 guidewire was advanced. A 5 French vascular sheath was placed. Through this a 5 French C2 catheter was coaxial advanced in used to selectively catheterize the celiac axis for selective arteriography. An angled Glidewire was advanced into the gastroduodenal artery but the C2 catheter could not be adequately advanced distally. A coaxial renegade micro catheter with transient guidewire was then advanced through the C2 and used to selectively catheterize the gastroduodenal artery. Coil embolization of the GDA performed with 3-5 mm coils the cessation of flow. After final arteriography through the C2 catheter, the catheter and sheath were removed and hemostasis achieved with the aid of the Exoseal device after confirmatory femoral arteriography. The patient tolerated the procedure well. COMPLICATIONS: None immediate FINDINGS: Brisk bleeding from a distal branch of the gastroduodenal artery was evident on the initial arteriogram, into what is probably the lumen of the duodenum. The vessel was selectively catheterized and coil embolization performed until cessation of flow through the treated segment and no further extravasation evident. There is elsewhere diffuse arterial vasospasm. No other pathologic regions identified. IMPRESSION: 1. Brisk active arterial extravasation from a branch of the gastroduodenal artery probably into the duodenal lumen. 2. Technically successful coil embolization of the gastroduodenal artery with cessation of extravasation. Electronically Signed   By: Lucrezia Europe M.D.   On: 04/30/2015  12:08   Ir Angiogram Selective Each Additional Vessel  04/30/2015  CLINICAL DATA:  Large volume upper GI bleed. Endoscopy revealed probable duodenal ulcer has active source, incompletely controlled. EXAM: IR EMBO ART VEN HEMORR LYMPH EXTRAV INC GUIDE ROADMAPPING; IR ULTRASOUND GUIDANCE VASC ACCESS LEFT; ARTERIOGRAPHY; SELECTIVE VISCERAL ARTERIOGRAPHY; ADDITIONAL ARTERIOGRAPHY ANESTHESIA/SEDATION: Intravenous Fentanyl and Versed had been administered for previous procedure. No additional medications were administered. Radiology RN provided continuous cardiorespiratory monitoring. MEDICATIONS: Lidocaine 1% subcutaneous 5 mL CONTRAST:  71mL OMNIPAQUE IOHEXOL 300 MG/ML  SOLN PROCEDURE: The procedure, risks (including but not limited to bleeding, infection, organ damage ), benefits, and alternatives were explained to the spouse and daughter. Questions regarding the procedure were encouraged and answered. The family understands and consents to the procedure. Because of a indwelling right femoral venous catheter, a left-sided femoral approach was selected. Site was marked, prepped with Betadine, draped in usual sterile fashion, infiltrated locally with 1% lidocaine. Under real-time ultrasound guidance, the left femoral artery was accessed with a 21-gauge micropuncture needle, exchanged over a 018 guidewire for a transitional dilator, through which a 035 guidewire was advanced. A 5 French vascular sheath was placed. Through this a 5 French C2 catheter was coaxial advanced in used to selectively catheterize the celiac axis for selective arteriography. An angled Glidewire was advanced into the gastroduodenal artery but the C2 catheter  could not be adequately advanced distally. A coaxial renegade micro catheter with transient guidewire was then advanced through the C2 and used to selectively catheterize the gastroduodenal artery. Coil embolization of the GDA performed with 3-5 mm coils the cessation of flow. After final  arteriography through the C2 catheter, the catheter and sheath were removed and hemostasis achieved with the aid of the Exoseal device after confirmatory femoral arteriography. The patient tolerated the procedure well. COMPLICATIONS: None immediate FINDINGS: Brisk bleeding from a distal branch of the gastroduodenal artery was evident on the initial arteriogram, into what is probably the lumen of the duodenum. The vessel was selectively catheterized and coil embolization performed until cessation of flow through the treated segment and no further extravasation evident. There is elsewhere diffuse arterial vasospasm. No other pathologic regions identified. IMPRESSION: 1. Brisk active arterial extravasation from a branch of the gastroduodenal artery probably into the duodenal lumen. 2. Technically successful coil embolization of the gastroduodenal artery with cessation of extravasation. Electronically Signed   By: Lucrezia Europe M.D.   On: 04/30/2015 12:08   Ir Angiogram Follow Up Study  04/30/2015  CLINICAL DATA:  Large volume upper GI bleed. Endoscopy revealed probable duodenal ulcer has active source, incompletely controlled. EXAM: IR EMBO ART VEN HEMORR LYMPH EXTRAV INC GUIDE ROADMAPPING; IR ULTRASOUND GUIDANCE VASC ACCESS LEFT; ARTERIOGRAPHY; SELECTIVE VISCERAL ARTERIOGRAPHY; ADDITIONAL ARTERIOGRAPHY ANESTHESIA/SEDATION: Intravenous Fentanyl and Versed had been administered for previous procedure. No additional medications were administered. Radiology RN provided continuous cardiorespiratory monitoring. MEDICATIONS: Lidocaine 1% subcutaneous 5 mL CONTRAST:  70mL OMNIPAQUE IOHEXOL 300 MG/ML  SOLN PROCEDURE: The procedure, risks (including but not limited to bleeding, infection, organ damage ), benefits, and alternatives were explained to the spouse and daughter. Questions regarding the procedure were encouraged and answered. The family understands and consents to the procedure. Because of a indwelling right femoral  venous catheter, a left-sided femoral approach was selected. Site was marked, prepped with Betadine, draped in usual sterile fashion, infiltrated locally with 1% lidocaine. Under real-time ultrasound guidance, the left femoral artery was accessed with a 21-gauge micropuncture needle, exchanged over a 018 guidewire for a transitional dilator, through which a 035 guidewire was advanced. A 5 French vascular sheath was placed. Through this a 5 French C2 catheter was coaxial advanced in used to selectively catheterize the celiac axis for selective arteriography. An angled Glidewire was advanced into the gastroduodenal artery but the C2 catheter could not be adequately advanced distally. A coaxial renegade micro catheter with transient guidewire was then advanced through the C2 and used to selectively catheterize the gastroduodenal artery. Coil embolization of the GDA performed with 3-5 mm coils the cessation of flow. After final arteriography through the C2 catheter, the catheter and sheath were removed and hemostasis achieved with the aid of the Exoseal device after confirmatory femoral arteriography. The patient tolerated the procedure well. COMPLICATIONS: None immediate FINDINGS: Brisk bleeding from a distal branch of the gastroduodenal artery was evident on the initial arteriogram, into what is probably the lumen of the duodenum. The vessel was selectively catheterized and coil embolization performed until cessation of flow through the treated segment and no further extravasation evident. There is elsewhere diffuse arterial vasospasm. No other pathologic regions identified. IMPRESSION: 1. Brisk active arterial extravasation from a branch of the gastroduodenal artery probably into the duodenal lumen. 2. Technically successful coil embolization of the gastroduodenal artery with cessation of extravasation. Electronically Signed   By: Lucrezia Europe M.D.   On: 04/30/2015 12:08   Ir US  Guide Vasc Access Left  04/30/2015   CLINICAL DATA:  Large volume upper GI bleed. Endoscopy revealed probable duodenal ulcer has active source, incompletely controlled. EXAM: IR EMBO ART VEN HEMORR LYMPH EXTRAV INC GUIDE ROADMAPPING; IR ULTRASOUND GUIDANCE VASC ACCESS LEFT; ARTERIOGRAPHY; SELECTIVE VISCERAL ARTERIOGRAPHY; ADDITIONAL ARTERIOGRAPHY ANESTHESIA/SEDATION: Intravenous Fentanyl and Versed had been administered for previous procedure. No additional medications were administered. Radiology RN provided continuous cardiorespiratory monitoring. MEDICATIONS: Lidocaine 1% subcutaneous 5 mL CONTRAST:  71mL OMNIPAQUE IOHEXOL 300 MG/ML  SOLN PROCEDURE: The procedure, risks (including but not limited to bleeding, infection, organ damage ), benefits, and alternatives were explained to the spouse and daughter. Questions regarding the procedure were encouraged and answered. The family understands and consents to the procedure. Because of a indwelling right femoral venous catheter, a left-sided femoral approach was selected. Site was marked, prepped with Betadine, draped in usual sterile fashion, infiltrated locally with 1% lidocaine. Under real-time ultrasound guidance, the left femoral artery was accessed with a 21-gauge micropuncture needle, exchanged over a 018 guidewire for a transitional dilator, through which a 035 guidewire was advanced. A 5 French vascular sheath was placed. Through this a 5 French C2 catheter was coaxial advanced in used to selectively catheterize the celiac axis for selective arteriography. An angled Glidewire was advanced into the gastroduodenal artery but the C2 catheter could not be adequately advanced distally. A coaxial renegade micro catheter with transient guidewire was then advanced through the C2 and used to selectively catheterize the gastroduodenal artery. Coil embolization of the GDA performed with 3-5 mm coils the cessation of flow. After final arteriography through the C2 catheter, the catheter and sheath were  removed and hemostasis achieved with the aid of the Exoseal device after confirmatory femoral arteriography. The patient tolerated the procedure well. COMPLICATIONS: None immediate FINDINGS: Brisk bleeding from a distal branch of the gastroduodenal artery was evident on the initial arteriogram, into what is probably the lumen of the duodenum. The vessel was selectively catheterized and coil embolization performed until cessation of flow through the treated segment and no further extravasation evident. There is elsewhere diffuse arterial vasospasm. No other pathologic regions identified. IMPRESSION: 1. Brisk active arterial extravasation from a branch of the gastroduodenal artery probably into the duodenal lumen. 2. Technically successful coil embolization of the gastroduodenal artery with cessation of extravasation. Electronically Signed   By: Lucrezia Europe M.D.   On: 04/30/2015 12:08   Dg Chest Port 1 View  04/29/2015  CLINICAL DATA:  Post intubation. EXAM: PORTABLE CHEST 1 VIEW COMPARISON:  04/29/2015 FINDINGS: Interval placement of an endotracheal tube with tip measuring 2.9 cm above the carina. Shallow inspiration. Normal heart size and pulmonary vascularity. Lungs appear clear. No blunting of costophrenic angles. No pneumothorax. Tortuous aorta. Surgical clips in the right upper quadrant. IMPRESSION: Endotracheal tube tip measures 2.9 cm above the carina. Shallow inspiration. No evidence of active pulmonary disease. Electronically Signed   By: Lucienne Capers M.D.   On: 04/29/2015 22:52   Dg Abd Portable 1v  04/29/2015  CLINICAL DATA:  81 year old female with bloody stool and vomiting blood EXAM: PORTABLE ABDOMEN - 1 VIEW COMPARISON:  None. FINDINGS: Single upright radiograph demonstrates gas within the stomach. No free air identified. External defibrillator pads project over the left chest and right upper quadrant. The heart is likely mildly enlarged. The lungs appear clear. No acute osseous abnormality.  IMPRESSION: 1. No free air visualized. 2. Mild cardiomegaly. 3. The lungs appear clear. Electronically Signed   By: Dellis Filbert.D.  On: 04/29/2015 21:43   [place] Guide Roadmapping  04/30/2015  CLINICAL DATA:  Large volume upper GI bleed. Endoscopy revealed probable duodenal ulcer has active source, incompletely controlled. EXAM: IR EMBO ART VEN HEMORR LYMPH EXTRAV INC GUIDE ROADMAPPING; IR ULTRASOUND GUIDANCE VASC ACCESS LEFT; ARTERIOGRAPHY; SELECTIVE VISCERAL ARTERIOGRAPHY; ADDITIONAL ARTERIOGRAPHY ANESTHESIA/SEDATION: Intravenous Fentanyl and Versed had been administered for previous procedure. No additional medications were administered. Radiology RN provided continuous cardiorespiratory monitoring. MEDICATIONS: Lidocaine 1% subcutaneous 5 mL CONTRAST:  63mL OMNIPAQUE IOHEXOL 300 MG/ML  SOLN PROCEDURE: The procedure, risks (including but not limited to bleeding, infection, organ damage ), benefits, and alternatives were explained to the spouse and daughter. Questions regarding the procedure were encouraged and answered. The family understands and consents to the procedure. Because of a indwelling right femoral venous catheter, a left-sided femoral approach was selected. Site was marked, prepped with Betadine, draped in usual sterile fashion, infiltrated locally with 1% lidocaine. Under real-time ultrasound guidance, the left femoral artery was accessed with a 21-gauge micropuncture needle, exchanged over a 018 guidewire for a transitional dilator, through which a 035 guidewire was advanced. A 5 French vascular sheath was placed. Through this a 5 French C2 catheter was coaxial advanced in used to selectively catheterize the celiac axis for selective arteriography. An angled Glidewire was advanced into the gastroduodenal artery but the C2 catheter could not be adequately advanced distally. A coaxial renegade micro catheter with transient guidewire was then advanced  through the C2 and used to selectively catheterize the gastroduodenal artery. Coil embolization of the GDA performed with 3-5 mm coils the cessation of flow. After final arteriography through the C2 catheter, the catheter and sheath were removed and hemostasis achieved with the aid of the Exoseal device after confirmatory femoral arteriography. The patient tolerated the procedure well. COMPLICATIONS: None immediate FINDINGS: Brisk bleeding from a distal branch of the gastroduodenal artery was evident on the initial arteriogram, into what is probably the lumen of the duodenum. The vessel was selectively catheterized and coil embolization performed until cessation of flow through the treated segment and no further extravasation evident. There is elsewhere diffuse arterial vasospasm. No other pathologic regions identified. IMPRESSION: 1. Brisk active arterial extravasation from a branch of the gastroduodenal artery probably into the duodenal lumen. 2. Technically successful coil embolization of the gastroduodenal artery with cessation of extravasation. Electronically Signed   By: Lucrezia Europe M.D.   On: 04/30/2015 12:08    Assessment & Plan:   There are no diagnoses linked to this encounter. I am having Sophia [name] maintain her aspirin, multivitamin with minerals, Calcium-Magnesium, Co Q 10, Omega 3, simvastatin, hyoscyamine, neomycin-polymyxin b-dexamethasone, levothyroxine, carvedilol, and omeprazole.  No orders of the defined types were placed in this encounter.     Follow-up: No Follow-up on file.  Claretta Fraise, M.D.

## 2015-05-16 ENCOUNTER — Encounter: Payer: Self-pay | Admitting: Family Medicine

## 2015-05-16 DIAGNOSIS — N189 Chronic kidney disease, unspecified: Secondary | ICD-10-CM | POA: Diagnosis not present

## 2015-05-16 DIAGNOSIS — N179 Acute kidney failure, unspecified: Secondary | ICD-10-CM | POA: Diagnosis not present

## 2015-05-16 DIAGNOSIS — K260 Acute duodenal ulcer with hemorrhage: Secondary | ICD-10-CM | POA: Diagnosis not present

## 2015-05-16 DIAGNOSIS — D62 Acute posthemorrhagic anemia: Secondary | ICD-10-CM | POA: Diagnosis not present

## 2015-05-16 DIAGNOSIS — Z87440 Personal history of urinary (tract) infections: Secondary | ICD-10-CM | POA: Diagnosis not present

## 2015-05-20 DIAGNOSIS — N189 Chronic kidney disease, unspecified: Secondary | ICD-10-CM | POA: Diagnosis not present

## 2015-05-20 DIAGNOSIS — K260 Acute duodenal ulcer with hemorrhage: Secondary | ICD-10-CM | POA: Diagnosis not present

## 2015-05-20 DIAGNOSIS — Z87440 Personal history of urinary (tract) infections: Secondary | ICD-10-CM | POA: Diagnosis not present

## 2015-05-20 DIAGNOSIS — N179 Acute kidney failure, unspecified: Secondary | ICD-10-CM | POA: Diagnosis not present

## 2015-05-20 DIAGNOSIS — D62 Acute posthemorrhagic anemia: Secondary | ICD-10-CM | POA: Diagnosis not present

## 2015-05-20 NOTE — Progress Notes (Signed)
Erroneous

## 2015-05-22 DIAGNOSIS — N179 Acute kidney failure, unspecified: Secondary | ICD-10-CM | POA: Diagnosis not present

## 2015-05-22 DIAGNOSIS — K260 Acute duodenal ulcer with hemorrhage: Secondary | ICD-10-CM | POA: Diagnosis not present

## 2015-05-22 DIAGNOSIS — N189 Chronic kidney disease, unspecified: Secondary | ICD-10-CM | POA: Diagnosis not present

## 2015-05-22 DIAGNOSIS — D62 Acute posthemorrhagic anemia: Secondary | ICD-10-CM | POA: Diagnosis not present

## 2015-05-22 DIAGNOSIS — Z87440 Personal history of urinary (tract) infections: Secondary | ICD-10-CM | POA: Diagnosis not present

## 2015-05-24 DIAGNOSIS — D62 Acute posthemorrhagic anemia: Secondary | ICD-10-CM | POA: Diagnosis not present

## 2015-05-24 DIAGNOSIS — N179 Acute kidney failure, unspecified: Secondary | ICD-10-CM | POA: Diagnosis not present

## 2015-05-24 DIAGNOSIS — N189 Chronic kidney disease, unspecified: Secondary | ICD-10-CM | POA: Diagnosis not present

## 2015-05-24 DIAGNOSIS — K260 Acute duodenal ulcer with hemorrhage: Secondary | ICD-10-CM | POA: Diagnosis not present

## 2015-05-24 DIAGNOSIS — Z87440 Personal history of urinary (tract) infections: Secondary | ICD-10-CM | POA: Diagnosis not present

## 2015-05-27 ENCOUNTER — Ambulatory Visit: Payer: Medicare Other | Admitting: Family Medicine

## 2015-05-27 DIAGNOSIS — N189 Chronic kidney disease, unspecified: Secondary | ICD-10-CM | POA: Diagnosis not present

## 2015-05-27 DIAGNOSIS — D62 Acute posthemorrhagic anemia: Secondary | ICD-10-CM | POA: Diagnosis not present

## 2015-05-27 DIAGNOSIS — Z87440 Personal history of urinary (tract) infections: Secondary | ICD-10-CM | POA: Diagnosis not present

## 2015-05-27 DIAGNOSIS — N179 Acute kidney failure, unspecified: Secondary | ICD-10-CM | POA: Diagnosis not present

## 2015-05-27 DIAGNOSIS — K260 Acute duodenal ulcer with hemorrhage: Secondary | ICD-10-CM | POA: Diagnosis not present

## 2015-05-28 DIAGNOSIS — K260 Acute duodenal ulcer with hemorrhage: Secondary | ICD-10-CM | POA: Diagnosis not present

## 2015-05-28 DIAGNOSIS — N179 Acute kidney failure, unspecified: Secondary | ICD-10-CM | POA: Diagnosis not present

## 2015-05-28 DIAGNOSIS — N189 Chronic kidney disease, unspecified: Secondary | ICD-10-CM | POA: Diagnosis not present

## 2015-05-28 DIAGNOSIS — Z87440 Personal history of urinary (tract) infections: Secondary | ICD-10-CM | POA: Diagnosis not present

## 2015-05-28 DIAGNOSIS — D62 Acute posthemorrhagic anemia: Secondary | ICD-10-CM | POA: Diagnosis not present

## 2015-05-29 DIAGNOSIS — K260 Acute duodenal ulcer with hemorrhage: Secondary | ICD-10-CM | POA: Diagnosis not present

## 2015-05-29 DIAGNOSIS — D62 Acute posthemorrhagic anemia: Secondary | ICD-10-CM | POA: Diagnosis not present

## 2015-05-29 DIAGNOSIS — N179 Acute kidney failure, unspecified: Secondary | ICD-10-CM | POA: Diagnosis not present

## 2015-05-29 DIAGNOSIS — N189 Chronic kidney disease, unspecified: Secondary | ICD-10-CM | POA: Diagnosis not present

## 2015-05-29 DIAGNOSIS — Z87440 Personal history of urinary (tract) infections: Secondary | ICD-10-CM | POA: Diagnosis not present

## 2015-05-30 DIAGNOSIS — N189 Chronic kidney disease, unspecified: Secondary | ICD-10-CM | POA: Diagnosis not present

## 2015-05-30 DIAGNOSIS — K260 Acute duodenal ulcer with hemorrhage: Secondary | ICD-10-CM | POA: Diagnosis not present

## 2015-05-30 DIAGNOSIS — D62 Acute posthemorrhagic anemia: Secondary | ICD-10-CM | POA: Diagnosis not present

## 2015-05-30 DIAGNOSIS — Z87440 Personal history of urinary (tract) infections: Secondary | ICD-10-CM | POA: Diagnosis not present

## 2015-05-30 DIAGNOSIS — N179 Acute kidney failure, unspecified: Secondary | ICD-10-CM | POA: Diagnosis not present

## 2015-06-04 ENCOUNTER — Ambulatory Visit (INDEPENDENT_AMBULATORY_CARE_PROVIDER_SITE_OTHER): Payer: Medicare Other | Admitting: Family Medicine

## 2015-06-04 ENCOUNTER — Encounter: Payer: Self-pay | Admitting: Family Medicine

## 2015-06-04 VITALS — BP 181/82 | HR 66 | Temp 97.0°F | Ht 64.0 in | Wt 127.4 lb

## 2015-06-04 DIAGNOSIS — I10 Essential (primary) hypertension: Secondary | ICD-10-CM | POA: Diagnosis not present

## 2015-06-04 DIAGNOSIS — D62 Acute posthemorrhagic anemia: Secondary | ICD-10-CM | POA: Diagnosis not present

## 2015-06-04 DIAGNOSIS — R739 Hyperglycemia, unspecified: Secondary | ICD-10-CM | POA: Diagnosis not present

## 2015-06-04 DIAGNOSIS — N179 Acute kidney failure, unspecified: Secondary | ICD-10-CM

## 2015-06-04 DIAGNOSIS — N189 Chronic kidney disease, unspecified: Secondary | ICD-10-CM | POA: Diagnosis not present

## 2015-06-04 DIAGNOSIS — K264 Chronic or unspecified duodenal ulcer with hemorrhage: Secondary | ICD-10-CM | POA: Diagnosis not present

## 2015-06-04 DIAGNOSIS — E785 Hyperlipidemia, unspecified: Secondary | ICD-10-CM

## 2015-06-04 DIAGNOSIS — N182 Chronic kidney disease, stage 2 (mild): Secondary | ICD-10-CM

## 2015-06-04 DIAGNOSIS — E119 Type 2 diabetes mellitus without complications: Secondary | ICD-10-CM | POA: Diagnosis not present

## 2015-06-04 MED ORDER — CARVEDILOL PHOSPHATE ER 20 MG PO CP24: 20.0000 mg | ORAL_CAPSULE | Freq: Every day | ORAL | Status: DC

## 2015-06-04 MED ORDER — OMEPRAZOLE 40 MG PO CPDR: DELAYED_RELEASE_CAPSULE | ORAL | Status: DC

## 2015-06-04 MED ORDER — LEVOTHYROXINE SODIUM 75 MCG PO TABS: ORAL_TABLET | ORAL | Status: DC

## 2015-06-04 MED ORDER — SIMVASTATIN 40 MG PO TABS: 20.0000 mg | ORAL_TABLET | Freq: Every day | ORAL | Status: DC

## 2015-06-04 MED ORDER — CALCIUM-MAGNESIUM 500-250 MG PO TABS: 1.0000 | ORAL_TABLET | Freq: Every day | ORAL | Status: DC

## 2015-06-04 NOTE — Progress Notes (Signed)
Subjective:  Patient ID: Sophia [name], female    DOB: 1931/12/22  Age: 82 y.o. MRN: [id number]  CC: Hospitalization Follow-up   HPI Delorese Sellin presents for  follow-up of hypertension. Patient has no history of headache chest pain or shortness of breath or recent cough. Patient also denies symptoms of TIA such as numbness weakness lateralizing. Patient checks  blood pressure at home and has not had any elevated readings recently. Patient denies side effects from his medication. States taking it regularly. Patient also in for hospital follow-up due to catastrophic GI bleed requiring multiple transfusions. She lost consciousness for blood loss and hematemesis on approximately December 12. She was taking the emergency room and found to have significant blood throughout the upper GI tract apparently from duodenal ulcer that had bled. She received transfusions and has been treated with PPI. Currently she is taking iron and feeling back to her usual self. She had been started on omeprazole on her previous visit here. That was increased to current level while hospitalized. Patient in for follow-up of elevated cholesterol. Doing well without complaints on current medication. Denies side effects of statin including myalgia and arthralgia and nausea. Also in today for liver function testing. Currently no chest pain, shortness of breath or other cardiovascular related symptoms noted.   History Fredi has a past medical history of Hypertension; Hyperlipidemia; Diabetes mellitus without complication ([place]); and Chronic kidney disease.   She has past surgical history that includes Cholecystectomy; Sigmoidoscopy; and Esophagogastroduodenoscopy (N/A, 04/29/2015).   Her family history includes Colon cancer in her sister; Heart disease in her brother, father, and sister.She reports that she has never smoked. She has never used smokeless tobacco. She reports that she does not drink alcohol or use  illicit drugs.  Current Outpatient Prescriptions on File Prior to Visit  Medication Sig Dispense Refill  . Coenzyme Q10 (CO Q 10) 100 MG CAPS Take 100 mg by mouth daily as needed (takes once in while).     . Multiple Vitamins-Minerals (MULTIVITAMIN WITH MINERALS) tablet Take 1 tablet by mouth daily.    . Omega 3 1200 MG CAPS Take 1,200 mg by mouth daily as needed (takes once in while).     Marland Kitchen neomycin-polymyxin b-dexamethasone (MAXITROL) 3.5-10000-0.1 OINT Place 1 application into the right eye at bedtime. Reported on 06/04/2015     No current facility-administered medications on file prior to visit.    ROS Review of Systems  Constitutional: Negative for fever, activity change and appetite change.  HENT: Negative for congestion, rhinorrhea and sore throat.   Eyes: Negative for visual disturbance.  Respiratory: Negative for cough and shortness of breath.   Cardiovascular: Negative for chest pain and palpitations.  Gastrointestinal: Negative for nausea, abdominal pain and diarrhea.  Genitourinary: Negative for dysuria.  Musculoskeletal: Negative for myalgias and arthralgias.    Objective:  BP 181/82 mmHg  Pulse 66  Temp(Src) 97 F (36.1 C) (Oral)  Ht 5' 4" (1.626 m)  Wt 127 lb 6.4 oz (57.788 kg)  BMI 21.86 kg/m2  SpO2 98%  BP Readings from Last 3 Encounters:  06/04/15 181/82  05/08/15 154/76  04/26/15 151/78    Wt Readings from Last 3 Encounters:  06/04/15 127 lb 6.4 oz (57.788 kg)  05/08/15 146 lb 13.2 oz (66.6 kg)  04/26/15 128 lb 6.4 oz (58.242 kg)     Physical Exam  Constitutional: She is oriented to person, place, and time. She appears well-developed and well-nourished. No distress.  HENT:  Head: Normocephalic and atraumatic.  Right Ear: External ear normal.  Left Ear: External ear normal.  Nose: Nose normal.  Mouth/Throat: Oropharynx is clear and moist.  Eyes: Conjunctivae and EOM are normal. Pupils are equal, round, and reactive to light.  Neck: Normal range  of motion. Neck supple. No thyromegaly present.  Cardiovascular: Normal rate, regular rhythm and normal heart sounds.   No murmur heard. Pulmonary/Chest: Effort normal and breath sounds normal. No respiratory distress. She has no wheezes. She has no rales.  Abdominal: Soft. Bowel sounds are normal. She exhibits no distension. There is no tenderness.  Lymphadenopathy:    She has no cervical adenopathy.  Neurological: She is alert and oriented to person, place, and time. She has normal reflexes.  Skin: Skin is warm and dry.  Psychiatric: She has a normal mood and affect. Her behavior is normal. Judgment and thought content normal.    Lab Results  Component Value Date   HGBA1C 6.0* 05/03/2015   HGBA1C 5.6 04/30/2015   HGBA1C 6.6 12/03/2014    Lab Results  Component Value Date   WBC 6.6 06/04/2015   HGB 10.1* 05/08/2015   HCT 33.3* 06/04/2015   PLT 307 06/04/2015   GLUCOSE 96 06/04/2015   CHOL 196 06/04/2015   TRIG 250* 06/04/2015   HDL 41 06/04/2015   LDLCALC 105* 06/04/2015   ALT 10 06/04/2015   AST 18 06/04/2015   NA 144 06/04/2015   K 3.4* 06/04/2015   CL 104 06/04/2015   CREATININE 1.20* 06/04/2015   BUN 17 06/04/2015   CO2 22 06/04/2015   TSH 6.292* 05/03/2015   INR 1.58* 05/03/2015   HGBA1C 6.0* 05/03/2015    Ir Angiogram Visceral Selective  04/30/2015  CLINICAL DATA:  Large volume upper GI bleed. Endoscopy revealed probable duodenal ulcer has active source, incompletely controlled. EXAM: IR EMBO ART VEN HEMORR LYMPH EXTRAV INC GUIDE ROADMAPPING; IR ULTRASOUND GUIDANCE VASC ACCESS LEFT; ARTERIOGRAPHY; SELECTIVE VISCERAL ARTERIOGRAPHY; ADDITIONAL ARTERIOGRAPHY ANESTHESIA/SEDATION: Intravenous Fentanyl and Versed had been administered for previous procedure. No additional medications were administered. Radiology RN provided continuous cardiorespiratory monitoring. MEDICATIONS: Lidocaine 1% subcutaneous 5 mL CONTRAST:  30m OMNIPAQUE IOHEXOL 300 MG/ML  SOLN PROCEDURE: The  procedure, risks (including but not limited to bleeding, infection, organ damage ), benefits, and alternatives were explained to the spouse and daughter. Questions regarding the procedure were encouraged and answered. The family understands and consents to the procedure. Because of a indwelling right femoral venous catheter, a left-sided femoral approach was selected. Site was marked, prepped with Betadine, draped in usual sterile fashion, infiltrated locally with 1% lidocaine. Under real-time ultrasound guidance, the left femoral artery was accessed with a 21-gauge micropuncture needle, exchanged over a 018 guidewire for a transitional dilator, through which a 035 guidewire was advanced. A 5 French vascular sheath was placed. Through this a 5 French C2 catheter was coaxial advanced in used to selectively catheterize the celiac axis for selective arteriography. An angled Glidewire was advanced into the gastroduodenal artery but the C2 catheter could not be adequately advanced distally. A coaxial renegade micro catheter with transient guidewire was then advanced through the C2 and used to selectively catheterize the gastroduodenal artery. Coil embolization of the GDA performed with 3-5 mm coils the cessation of flow. After final arteriography through the C2 catheter, the catheter and sheath were removed and hemostasis achieved with the aid of the Exoseal device after confirmatory femoral arteriography. The patient tolerated the procedure well. COMPLICATIONS: None immediate FINDINGS: Brisk bleeding from a distal branch of the gastroduodenal  artery was evident on the initial arteriogram, into what is probably the lumen of the duodenum. The vessel was selectively catheterized and coil embolization performed until cessation of flow through the treated segment and no further extravasation evident. There is elsewhere diffuse arterial vasospasm. No other pathologic regions identified. IMPRESSION: 1. Brisk active arterial  extravasation from a branch of the gastroduodenal artery probably into the duodenal lumen. 2. Technically successful coil embolization of the gastroduodenal artery with cessation of extravasation. Electronically Signed   By: Lucrezia Europe M.D.   On: 04/30/2015 12:08   Ir Angiogram Selective Each Additional Vessel  04/30/2015  CLINICAL DATA:  Large volume upper GI bleed. Endoscopy revealed probable duodenal ulcer has active source, incompletely controlled. EXAM: IR EMBO ART VEN HEMORR LYMPH EXTRAV INC GUIDE ROADMAPPING; IR ULTRASOUND GUIDANCE VASC ACCESS LEFT; ARTERIOGRAPHY; SELECTIVE VISCERAL ARTERIOGRAPHY; ADDITIONAL ARTERIOGRAPHY ANESTHESIA/SEDATION: Intravenous Fentanyl and Versed had been administered for previous procedure. No additional medications were administered. Radiology RN provided continuous cardiorespiratory monitoring. MEDICATIONS: Lidocaine 1% subcutaneous 5 mL CONTRAST:  34m OMNIPAQUE IOHEXOL 300 MG/ML  SOLN PROCEDURE: The procedure, risks (including but not limited to bleeding, infection, organ damage ), benefits, and alternatives were explained to the spouse and daughter. Questions regarding the procedure were encouraged and answered. The family understands and consents to the procedure. Because of a indwelling right femoral venous catheter, a left-sided femoral approach was selected. Site was marked, prepped with Betadine, draped in usual sterile fashion, infiltrated locally with 1% lidocaine. Under real-time ultrasound guidance, the left femoral artery was accessed with a 21-gauge micropuncture needle, exchanged over a 018 guidewire for a transitional dilator, through which a 035 guidewire was advanced. A 5 French vascular sheath was placed. Through this a 5 French C2 catheter was coaxial advanced in used to selectively catheterize the celiac axis for selective arteriography. An angled Glidewire was advanced into the gastroduodenal artery but the C2 catheter could not be adequately advanced  distally. A coaxial renegade micro catheter with transient guidewire was then advanced through the C2 and used to selectively catheterize the gastroduodenal artery. Coil embolization of the GDA performed with 3-5 mm coils the cessation of flow. After final arteriography through the C2 catheter, the catheter and sheath were removed and hemostasis achieved with the aid of the Exoseal device after confirmatory femoral arteriography. The patient tolerated the procedure well. COMPLICATIONS: None immediate FINDINGS: Brisk bleeding from a distal branch of the gastroduodenal artery was evident on the initial arteriogram, into what is probably the lumen of the duodenum. The vessel was selectively catheterized and coil embolization performed until cessation of flow through the treated segment and no further extravasation evident. There is elsewhere diffuse arterial vasospasm. No other pathologic regions identified. IMPRESSION: 1. Brisk active arterial extravasation from a branch of the gastroduodenal artery probably into the duodenal lumen. 2. Technically successful coil embolization of the gastroduodenal artery with cessation of extravasation. Electronically Signed   By: DLucrezia EuropeM.D.   On: 04/30/2015 12:08   Ir Angiogram Follow Up Study  04/30/2015  CLINICAL DATA:  Large volume upper GI bleed. Endoscopy revealed probable duodenal ulcer has active source, incompletely controlled. EXAM: IR EMBO ART VEN HEMORR LYMPH EXTRAV INC GUIDE ROADMAPPING; IR ULTRASOUND GUIDANCE VASC ACCESS LEFT; ARTERIOGRAPHY; SELECTIVE VISCERAL ARTERIOGRAPHY; ADDITIONAL ARTERIOGRAPHY ANESTHESIA/SEDATION: Intravenous Fentanyl and Versed had been administered for previous procedure. No additional medications were administered. Radiology RN provided continuous cardiorespiratory monitoring. MEDICATIONS: Lidocaine 1% subcutaneous 5 mL CONTRAST:  571mOMNIPAQUE IOHEXOL 300 MG/ML  SOLN PROCEDURE: The procedure,  risks (including but not limited to  bleeding, infection, organ damage ), benefits, and alternatives were explained to the spouse and daughter. Questions regarding the procedure were encouraged and answered. The family understands and consents to the procedure. Because of a indwelling right femoral venous catheter, a left-sided femoral approach was selected. Site was marked, prepped with Betadine, draped in usual sterile fashion, infiltrated locally with 1% lidocaine. Under real-time ultrasound guidance, the left femoral artery was accessed with a 21-gauge micropuncture needle, exchanged over a 018 guidewire for a transitional dilator, through which a 035 guidewire was advanced. A 5 French vascular sheath was placed. Through this a 5 French C2 catheter was coaxial advanced in used to selectively catheterize the celiac axis for selective arteriography. An angled Glidewire was advanced into the gastroduodenal artery but the C2 catheter could not be adequately advanced distally. A coaxial renegade micro catheter with transient guidewire was then advanced through the C2 and used to selectively catheterize the gastroduodenal artery. Coil embolization of the GDA performed with 3-5 mm coils the cessation of flow. After final arteriography through the C2 catheter, the catheter and sheath were removed and hemostasis achieved with the aid of the Exoseal device after confirmatory femoral arteriography. The patient tolerated the procedure well. COMPLICATIONS: None immediate FINDINGS: Brisk bleeding from a distal branch of the gastroduodenal artery was evident on the initial arteriogram, into what is probably the lumen of the duodenum. The vessel was selectively catheterized and coil embolization performed until cessation of flow through the treated segment and no further extravasation evident. There is elsewhere diffuse arterial vasospasm. No other pathologic regions identified. IMPRESSION: 1. Brisk active arterial extravasation from a branch of the gastroduodenal  artery probably into the duodenal lumen. 2. Technically successful coil embolization of the gastroduodenal artery with cessation of extravasation. Electronically Signed   By: Lucrezia Europe M.D.   On: 04/30/2015 12:08   Ir US Guide Vasc Access Left  04/30/2015  CLINICAL DATA:  Large volume upper GI bleed. Endoscopy revealed probable duodenal ulcer has active source, incompletely controlled. EXAM: IR EMBO ART VEN HEMORR LYMPH EXTRAV INC GUIDE ROADMAPPING; IR ULTRASOUND GUIDANCE VASC ACCESS LEFT; ARTERIOGRAPHY; SELECTIVE VISCERAL ARTERIOGRAPHY; ADDITIONAL ARTERIOGRAPHY ANESTHESIA/SEDATION: Intravenous Fentanyl and Versed had been administered for previous procedure. No additional medications were administered. Radiology RN provided continuous cardiorespiratory monitoring. MEDICATIONS: Lidocaine 1% subcutaneous 5 mL CONTRAST:  33m OMNIPAQUE IOHEXOL 300 MG/ML  SOLN PROCEDURE: The procedure, risks (including but not limited to bleeding, infection, organ damage ), benefits, and alternatives were explained to the spouse and daughter. Questions regarding the procedure were encouraged and answered. The family understands and consents to the procedure. Because of a indwelling right femoral venous catheter, a left-sided femoral approach was selected. Site was marked, prepped with Betadine, draped in usual sterile fashion, infiltrated locally with 1% lidocaine. Under real-time ultrasound guidance, the left femoral artery was accessed with a 21-gauge micropuncture needle, exchanged over a 018 guidewire for a transitional dilator, through which a 035 guidewire was advanced. A 5 French vascular sheath was placed. Through this a 5 French C2 catheter was coaxial advanced in used to selectively catheterize the celiac axis for selective arteriography. An angled Glidewire was advanced into the gastroduodenal artery but the C2 catheter could not be adequately advanced distally. A coaxial renegade micro catheter with transient guidewire  was then advanced through the C2 and used to selectively catheterize the gastroduodenal artery. Coil embolization of the GDA performed with 3-5 mm coils the cessation of flow. After final arteriography  through the C2 catheter, the catheter and sheath were removed and hemostasis achieved with the aid of the Exoseal device after confirmatory femoral arteriography. The patient tolerated the procedure well. COMPLICATIONS: None immediate FINDINGS: Brisk bleeding from a distal branch of the gastroduodenal artery was evident on the initial arteriogram, into what is probably the lumen of the duodenum. The vessel was selectively catheterized and coil embolization performed until cessation of flow through the treated segment and no further extravasation evident. There is elsewhere diffuse arterial vasospasm. No other pathologic regions identified. IMPRESSION: 1. Brisk active arterial extravasation from a branch of the gastroduodenal artery probably into the duodenal lumen. 2. Technically successful coil embolization of the gastroduodenal artery with cessation of extravasation. Electronically Signed   By: Lucrezia Europe M.D.   On: 04/30/2015 12:08   Dg Chest Port 1 View  04/29/2015  CLINICAL DATA:  Post intubation. EXAM: PORTABLE CHEST 1 VIEW COMPARISON:  04/29/2015 FINDINGS: Interval placement of an endotracheal tube with tip measuring 2.9 cm above the carina. Shallow inspiration. Normal heart size and pulmonary vascularity. Lungs appear clear. No blunting of costophrenic angles. No pneumothorax. Tortuous aorta. Surgical clips in the right upper quadrant. IMPRESSION: Endotracheal tube tip measures 2.9 cm above the carina. Shallow inspiration. No evidence of active pulmonary disease. Electronically Signed   By: Lucienne Capers M.D.   On: 04/29/2015 22:52   Dg Abd Portable 1v  04/29/2015  CLINICAL DATA:  82 year old female with bloody stool and vomiting blood EXAM: PORTABLE ABDOMEN - 1 VIEW COMPARISON:  None. FINDINGS:  Single upright radiograph demonstrates gas within the stomach. No free air identified. External defibrillator pads project over the left chest and right upper quadrant. The heart is likely mildly enlarged. The lungs appear clear. No acute osseous abnormality. IMPRESSION: 1. No free air visualized. 2. Mild cardiomegaly. 3. The lungs appear clear. Electronically Signed   By: Jacqulynn Cadet M.D.   On: 04/29/2015 21:43   [place] Guide Roadmapping  04/30/2015  CLINICAL DATA:  Large volume upper GI bleed. Endoscopy revealed probable duodenal ulcer has active source, incompletely controlled. EXAM: IR EMBO ART VEN HEMORR LYMPH EXTRAV INC GUIDE ROADMAPPING; IR ULTRASOUND GUIDANCE VASC ACCESS LEFT; ARTERIOGRAPHY; SELECTIVE VISCERAL ARTERIOGRAPHY; ADDITIONAL ARTERIOGRAPHY ANESTHESIA/SEDATION: Intravenous Fentanyl and Versed had been administered for previous procedure. No additional medications were administered. Radiology RN provided continuous cardiorespiratory monitoring. MEDICATIONS: Lidocaine 1% subcutaneous 5 mL CONTRAST:  53m OMNIPAQUE IOHEXOL 300 MG/ML  SOLN PROCEDURE: The procedure, risks (including but not limited to bleeding, infection, organ damage ), benefits, and alternatives were explained to the spouse and daughter. Questions regarding the procedure were encouraged and answered. The family understands and consents to the procedure. Because of a indwelling right femoral venous catheter, a left-sided femoral approach was selected. Site was marked, prepped with Betadine, draped in usual sterile fashion, infiltrated locally with 1% lidocaine. Under real-time ultrasound guidance, the left femoral artery was accessed with a 21-gauge micropuncture needle, exchanged over a 018 guidewire for a transitional dilator, through which a 035 guidewire was advanced. A 5 French vascular sheath was placed. Through this a 5 French C2 catheter was coaxial advanced in used to selectively  catheterize the celiac axis for selective arteriography. An angled Glidewire was advanced into the gastroduodenal artery but the C2 catheter could not be adequately advanced distally. A coaxial renegade micro catheter with transient guidewire was then advanced through the C2 and used to selectively catheterize the gastroduodenal artery. Coil embolization of  the GDA performed with 3-5 mm coils the cessation of flow. After final arteriography through the C2 catheter, the catheter and sheath were removed and hemostasis achieved with the aid of the Exoseal device after confirmatory femoral arteriography. The patient tolerated the procedure well. COMPLICATIONS: None immediate FINDINGS: Brisk bleeding from a distal branch of the gastroduodenal artery was evident on the initial arteriogram, into what is probably the lumen of the duodenum. The vessel was selectively catheterized and coil embolization performed until cessation of flow through the treated segment and no further extravasation evident. There is elsewhere diffuse arterial vasospasm. No other pathologic regions identified. IMPRESSION: 1. Brisk active arterial extravasation from a branch of the gastroduodenal artery probably into the duodenal lumen. 2. Technically successful coil embolization of the gastroduodenal artery with cessation of extravasation. Electronically Signed   By: Lucrezia Europe M.D.   On: 04/30/2015 12:08    Assessment & Plan:   Kaydi was seen today for hospitalization follow-up.  Diagnoses and all orders for this visit:  Essential hypertension -     Cancel: POCT CBC  HLD (hyperlipidemia) -     CMP14+EGFR -     Lipid panel -     CBC with Differential/Platelet  Chronic kidney disease, stage 2 (mild) -     CMP14+EGFR -     CBC with Differential/Platelet  Hyperglycemia -     CMP14+EGFR -     CBC with Differential/Platelet  Other orders -     Discontinue: carvedilol (COREG CR) 20 MG 24 hr capsule; Take 1 capsule (20 mg total)  by mouth daily. For blood pressure -     Calcium-Magnesium 500-250 MG TABS; Take 1 tablet by mouth daily with supper. -     Discontinue: levothyroxine (SYNTHROID, LEVOTHROID) 75 MCG tablet; Take 1/2 each morning on an empty stomach one hour before breakfast. -     Discontinue: omeprazole (PRILOSEC) 40 MG capsule; Take on an empty stomach one hour before breakfast and another at bedtime, two hours after your last food or drink (except Water) -     simvastatin (ZOCOR) 40 MG tablet; Take 0.5 tablets (20 mg total) by mouth daily at 6 PM. At suppertime -     omeprazole (PRILOSEC) 40 MG capsule; Take on an empty stomach one hour before breakfast and another at bedtime, two hours after your last food or drink (except Water) -     levothyroxine (SYNTHROID, LEVOTHROID) 75 MCG tablet; Take 1/2 each morning on an empty stomach one hour before breakfast.  I have discontinued Ms. Suppa's aspirin, hyoscyamine, and carvedilol. I have also changed her Calcium-Magnesium and simvastatin. Additionally, I am having her maintain her multivitamin with minerals, Co Q 10, Omega 3, neomycin-polymyxin b-dexamethasone, omeprazole, and levothyroxine.  Meds ordered this encounter  Medications  . DISCONTD: carvedilol (COREG CR) 20 MG 24 hr capsule    Sig: Take 1 capsule (20 mg total) by mouth daily. For blood pressure    Dispense:  30 capsule    Refill:  5  . Calcium-Magnesium 500-250 MG TABS    Sig: Take 1 tablet by mouth daily with supper.    Dispense:  30 each    Refill:  5  . DISCONTD: levothyroxine (SYNTHROID, LEVOTHROID) 75 MCG tablet    Sig: Take 1/2 each morning on an empty stomach one hour before breakfast.    Dispense:  30 tablet    Refill:  5  . DISCONTD: omeprazole (PRILOSEC) 40 MG capsule    Sig: Take on an  empty stomach one hour before breakfast and another at bedtime, two hours after your last food or drink (except Water)    Dispense:  60 capsule    Refill:  5  . simvastatin (ZOCOR) 40 MG tablet     Sig: Take 0.5 tablets (20 mg total) by mouth daily at 6 PM. At suppertime    Dispense:  90 tablet    Refill:  1  . omeprazole (PRILOSEC) 40 MG capsule    Sig: Take on an empty stomach one hour before breakfast and another at bedtime, two hours after your last food or drink (except Water)    Dispense:  60 capsule    Refill:  5  . levothyroxine (SYNTHROID, LEVOTHROID) 75 MCG tablet    Sig: Take 1/2 each morning on an empty stomach one hour before breakfast.    Dispense:  30 tablet    Refill:  5     Follow-up: Return in about 6 weeks (around 07/16/2015).  Claretta Fraise, M.D.

## 2015-06-05 DIAGNOSIS — N179 Acute kidney failure, unspecified: Secondary | ICD-10-CM | POA: Diagnosis not present

## 2015-06-05 DIAGNOSIS — Z87440 Personal history of urinary (tract) infections: Secondary | ICD-10-CM | POA: Diagnosis not present

## 2015-06-05 DIAGNOSIS — N189 Chronic kidney disease, unspecified: Secondary | ICD-10-CM | POA: Diagnosis not present

## 2015-06-05 DIAGNOSIS — D62 Acute posthemorrhagic anemia: Secondary | ICD-10-CM | POA: Diagnosis not present

## 2015-06-05 DIAGNOSIS — K260 Acute duodenal ulcer with hemorrhage: Secondary | ICD-10-CM | POA: Diagnosis not present

## 2015-06-05 LAB — CMP14+EGFR
ALT: 10 IU/L (ref 0–32)
AST: 18 IU/L (ref 0–40)
Albumin/Globulin Ratio: 1.3 (ref 1.1–2.5)
Albumin: 3.7 g/dL (ref 3.5–4.7)
Alkaline Phosphatase: 79 IU/L (ref 39–117)
BUN/Creatinine Ratio: 14 (ref 11–26)
BUN: 17 mg/dL (ref 8–27)
Bilirubin Total: 0.4 mg/dL (ref 0.0–1.2)
CO2: 22 mmol/L (ref 18–29)
Calcium: 9.1 mg/dL (ref 8.7–10.3)
Chloride: 104 mmol/L (ref 96–106)
Creatinine, Ser: 1.2 mg/dL — ABNORMAL HIGH (ref 0.57–1.00)
GFR calc Af Amer: 49 mL/min/{1.73_m2} — ABNORMAL LOW (ref >59)
GFR calc non Af Amer: 42 mL/min/{1.73_m2} — ABNORMAL LOW (ref >59)
Globulin, Total: 2.8 g/dL (ref 1.5–4.5)
Glucose: 96 mg/dL (ref 65–99)
Potassium: 3.4 mmol/L — ABNORMAL LOW (ref 3.5–5.2)
Sodium: 144 mmol/L (ref 134–144)
Total Protein: 6.5 g/dL (ref 6.0–8.5)

## 2015-06-05 LAB — LIPID PANEL
Chol/HDL Ratio: 4.8 ratio units — ABNORMAL HIGH (ref 0.0–4.4)
Cholesterol, Total: 196 mg/dL (ref 100–199)
HDL: 41 mg/dL (ref >39)
LDL Calculated: 105 mg/dL — ABNORMAL HIGH (ref 0–99)
Triglycerides: 250 mg/dL — ABNORMAL HIGH (ref 0–149)
VLDL Cholesterol Cal: 50 mg/dL — ABNORMAL HIGH (ref 5–40)

## 2015-06-05 LAB — CBC WITH DIFFERENTIAL/PLATELET
Basophils Absolute: 0.1 10*3/uL (ref 0.0–0.2)
Basos: 1 %
EOS (ABSOLUTE): 0.3 10*3/uL (ref 0.0–0.4)
Eos: 5 %
Hematocrit: 33.3 % — ABNORMAL LOW (ref 34.0–46.6)
Hemoglobin: 10.7 g/dL — ABNORMAL LOW (ref 11.1–15.9)
Immature Grans (Abs): 0 10*3/uL (ref 0.0–0.1)
Immature Granulocytes: 0 %
Lymphocytes Absolute: 1.6 10*3/uL (ref 0.7–3.1)
Lymphs: 23 %
MCH: 28.6 pg (ref 26.6–33.0)
MCHC: 32.1 g/dL (ref 31.5–35.7)
MCV: 89 fL (ref 79–97)
Monocytes Absolute: 0.4 10*3/uL (ref 0.1–0.9)
Monocytes: 6 %
Neutrophils Absolute: 4.3 10*3/uL (ref 1.4–7.0)
Neutrophils: 65 %
Platelets: 307 10*3/uL (ref 150–379)
RBC: 3.74 x10E6/uL — ABNORMAL LOW (ref 3.77–5.28)
RDW: 18.4 % — ABNORMAL HIGH (ref 12.3–15.4)
WBC: 6.6 10*3/uL (ref 3.4–10.8)

## 2015-06-06 ENCOUNTER — Telehealth: Payer: Self-pay | Admitting: Family Medicine

## 2015-06-07 DIAGNOSIS — N179 Acute kidney failure, unspecified: Secondary | ICD-10-CM | POA: Diagnosis not present

## 2015-06-07 DIAGNOSIS — D62 Acute posthemorrhagic anemia: Secondary | ICD-10-CM | POA: Diagnosis not present

## 2015-06-07 DIAGNOSIS — K260 Acute duodenal ulcer with hemorrhage: Secondary | ICD-10-CM | POA: Diagnosis not present

## 2015-06-07 DIAGNOSIS — N189 Chronic kidney disease, unspecified: Secondary | ICD-10-CM | POA: Diagnosis not present

## 2015-06-07 DIAGNOSIS — Z87440 Personal history of urinary (tract) infections: Secondary | ICD-10-CM | POA: Diagnosis not present

## 2015-06-07 MED ORDER — CARVEDILOL 6.25 MG PO TABS: 6.2500 mg | ORAL_TABLET | Freq: Two times a day (BID) | ORAL | Status: DC

## 2015-06-07 NOTE — Telephone Encounter (Signed)
Please review and advise.

## 2015-06-07 NOTE — Telephone Encounter (Signed)
Pt is aware rx was sent to pharmacy as requested.

## 2015-06-18 ENCOUNTER — Ambulatory Visit (INDEPENDENT_AMBULATORY_CARE_PROVIDER_SITE_OTHER): Payer: Medicare Other | Admitting: Family Medicine

## 2015-06-18 DIAGNOSIS — D62 Acute posthemorrhagic anemia: Secondary | ICD-10-CM

## 2015-06-18 DIAGNOSIS — Z87440 Personal history of urinary (tract) infections: Secondary | ICD-10-CM | POA: Diagnosis not present

## 2015-06-18 DIAGNOSIS — N179 Acute kidney failure, unspecified: Secondary | ICD-10-CM | POA: Diagnosis not present

## 2015-06-18 DIAGNOSIS — N189 Chronic kidney disease, unspecified: Secondary | ICD-10-CM

## 2015-06-18 DIAGNOSIS — K260 Acute duodenal ulcer with hemorrhage: Secondary | ICD-10-CM

## 2015-07-22 ENCOUNTER — Ambulatory Visit (INDEPENDENT_AMBULATORY_CARE_PROVIDER_SITE_OTHER): Payer: Medicare Other | Admitting: Family Medicine

## 2015-07-22 ENCOUNTER — Encounter: Payer: Self-pay | Admitting: Family Medicine

## 2015-07-22 VITALS — BP 165/83 | HR 67 | Temp 97.3°F | Ht 64.0 in | Wt 122.4 lb

## 2015-07-22 DIAGNOSIS — D62 Acute posthemorrhagic anemia: Secondary | ICD-10-CM

## 2015-07-22 DIAGNOSIS — I10 Essential (primary) hypertension: Secondary | ICD-10-CM

## 2015-07-22 DIAGNOSIS — E785 Hyperlipidemia, unspecified: Secondary | ICD-10-CM

## 2015-07-22 DIAGNOSIS — E039 Hypothyroidism, unspecified: Secondary | ICD-10-CM

## 2015-07-22 DIAGNOSIS — R739 Hyperglycemia, unspecified: Secondary | ICD-10-CM | POA: Diagnosis not present

## 2015-07-22 NOTE — Patient Instructions (Signed)
Thank you for allowing us to care for you today. We strive to provide exceptional quality and compassionate care. Please let us know how we are doing and how we can help serve you better by filling out the survey that you receive from [employer].     

## 2015-07-22 NOTE — Progress Notes (Signed)
Subjective:  Patient ID: Sophia [name], female    DOB: 1932-04-16  Age: 82 y.o. MRN: [id number]  CC: Anemia   HPI Sophia [name] presents for Still feeling weak. Wonders if she should continue to mow her own lawn or hire someone. Denies abd pain, but reportds some central abd. Bloating. Not using [employer]. She continues to take omeprazole.  Patient presents for follow-up on  thyroid. She has a history of hypothyroidism for many years. It has been stable recently.Pt. denies any change in  voice, loss of hair, heat or cold intolerance. Energy level has been adequate to good. She denies constipation and diarrhea. No myxedema. Medication is as noted below. Verified that pt is taking it daily on an empty stomach. Well tolerated.   History Sophia [name] has a past medical history of Hypertension; Hyperlipidemia; Diabetes mellitus without complication ([place]); and Chronic kidney disease.   She has past surgical history that includes Cholecystectomy; Sigmoidoscopy; and Esophagogastroduodenoscopy (N/A, 04/29/2015).   Her family history includes Colon cancer in her sister; Heart disease in her brother, father, and sister.She reports that she has never smoked. She has never used smokeless tobacco. She reports that she does not drink alcohol or use illicit drugs.  Current Outpatient Prescriptions on File Prior to Visit  Medication Sig Dispense Refill  . Calcium-Magnesium 500-250 MG TABS Take 1 tablet by mouth daily with supper. 30 each 5  . carvedilol (COREG) 6.25 MG tablet Take 1 tablet (6.25 mg total) by mouth 2 (two) times daily with a meal. For blood pressure/heart/kidney 60 tablet 3  . Coenzyme Q10 (CO Q 10) 100 MG CAPS Take 100 mg by mouth daily as needed (takes once in while).     Marland Kitchen levothyroxine (SYNTHROID, LEVOTHROID) 75 MCG tablet Take 1/2 each morning on an empty stomach one hour before breakfast. 30 tablet 5  . Multiple Vitamins-Minerals (MULTIVITAMIN WITH MINERALS) tablet Take 1 tablet by  mouth daily.    Marland Kitchen neomycin-polymyxin b-dexamethasone (MAXITROL) 3.5-10000-0.1 OINT Place 1 application into the right eye at bedtime. Reported on 06/04/2015    . Omega 3 1200 MG CAPS Take 1,200 mg by mouth daily as needed (takes once in while).     Marland Kitchen omeprazole (PRILOSEC) 40 MG capsule Take on an empty stomach one hour before breakfast and another at bedtime, two hours after your last food or drink (except Water) 60 capsule 5  . simvastatin (ZOCOR) 40 MG tablet Take 0.5 tablets (20 mg total) by mouth daily at 6 PM. At suppertime 90 tablet 1   No current facility-administered medications on file prior to visit.     ROS Review of Systems  Constitutional: Negative for fever, activity change and appetite change.  HENT: Negative for congestion, rhinorrhea and sore throat.   Eyes: Negative for visual disturbance.  Respiratory: Negative for cough and shortness of breath.   Cardiovascular: Negative for chest pain and palpitations.  Gastrointestinal: Positive for abdominal distention. Negative for nausea, abdominal pain and diarrhea.  Genitourinary: Negative for dysuria.  Musculoskeletal: Negative for myalgias and arthralgias.    Objective:  BP 165/83 mmHg  Pulse 67  Temp(Src) 97.3 F (36.3 C) (Oral)  Ht 5' 4" (1.626 m)  Wt 122 lb 6.4 oz (55.52 kg)  BMI 21.00 kg/m2  BP Readings from Last 3 Encounters:  07/22/15 165/83  06/04/15 181/82  05/08/15 154/76    Wt Readings from Last 3 Encounters:  07/22/15 122 lb 6.4 oz (55.52 kg)  06/04/15 127 lb 6.4 oz (57.788 kg)  05/08/15 146  lb 13.2 oz (66.6 kg)     Physical Exam  Constitutional: She is oriented to person, place, and time. She appears well-developed and well-nourished. No distress.  HENT:  Head: Normocephalic and atraumatic.  Right Ear: External ear normal.  Left Ear: External ear normal.  Nose: Nose normal.  Mouth/Throat: Oropharynx is clear and moist.  Eyes: Conjunctivae and EOM are normal. Pupils are equal, round, and  reactive to light.  Neck: Normal range of motion. Neck supple. No thyromegaly present.  Cardiovascular: Normal rate, regular rhythm and normal heart sounds.   No murmur heard. Pulmonary/Chest: Effort normal and breath sounds normal. No respiratory distress. She has no wheezes. She has no rales.  Abdominal: Soft. Bowel sounds are normal. She exhibits no distension. There is no tenderness.  Lymphadenopathy:    She has no cervical adenopathy.  Neurological: She is alert and oriented to person, place, and time. She has normal reflexes.  Skin: Skin is warm and dry.  Psychiatric: She has a normal mood and affect. Her behavior is normal. Judgment and thought content normal.     Lab Results  Component Value Date   WBC 6.6 06/04/2015   HGB 10.1* 05/08/2015   HCT 33.3* 06/04/2015   PLT 307 06/04/2015   GLUCOSE 96 06/04/2015   CHOL 196 06/04/2015   TRIG 250* 06/04/2015   HDL 41 06/04/2015   LDLCALC 105* 06/04/2015   ALT 10 06/04/2015   AST 18 06/04/2015   NA 144 06/04/2015   K 3.4* 06/04/2015   CL 104 06/04/2015   CREATININE 1.20* 06/04/2015   BUN 17 06/04/2015   CO2 22 06/04/2015   TSH 6.292* 05/03/2015   INR 1.58* 05/03/2015   HGBA1C 6.0* 05/03/2015    Ir Angiogram Visceral Selective  04/30/2015  CLINICAL DATA:  Large volume upper GI bleed. Endoscopy revealed probable duodenal ulcer has active source, incompletely controlled. EXAM: IR EMBO ART VEN HEMORR LYMPH EXTRAV INC GUIDE ROADMAPPING; IR ULTRASOUND GUIDANCE VASC ACCESS LEFT; ARTERIOGRAPHY; SELECTIVE VISCERAL ARTERIOGRAPHY; ADDITIONAL ARTERIOGRAPHY ANESTHESIA/SEDATION: Intravenous Fentanyl and Versed had been administered for previous procedure. No additional medications were administered. Radiology RN provided continuous cardiorespiratory monitoring. MEDICATIONS: Lidocaine 1% subcutaneous 5 mL CONTRAST:  11m OMNIPAQUE IOHEXOL 300 MG/ML  SOLN PROCEDURE: The procedure, risks (including but not limited to bleeding, infection, organ  damage ), benefits, and alternatives were explained to the spouse and daughter. Questions regarding the procedure were encouraged and answered. The family understands and consents to the procedure. Because of a indwelling right femoral venous catheter, a left-sided femoral approach was selected. Site was marked, prepped with Betadine, draped in usual sterile fashion, infiltrated locally with 1% lidocaine. Under real-time ultrasound guidance, the left femoral artery was accessed with a 21-gauge micropuncture needle, exchanged over a 018 guidewire for a transitional dilator, through which a 035 guidewire was advanced. A 5 French vascular sheath was placed. Through this a 5 French C2 catheter was coaxial advanced in used to selectively catheterize the celiac axis for selective arteriography. An angled Glidewire was advanced into the gastroduodenal artery but the C2 catheter could not be adequately advanced distally. A coaxial renegade micro catheter with transient guidewire was then advanced through the C2 and used to selectively catheterize the gastroduodenal artery. Coil embolization of the GDA performed with 3-5 mm coils the cessation of flow. After final arteriography through the C2 catheter, the catheter and sheath were removed and hemostasis achieved with the aid of the Exoseal device after confirmatory femoral arteriography. The patient tolerated the procedure well.  COMPLICATIONS: None immediate FINDINGS: Brisk bleeding from a distal branch of the gastroduodenal artery was evident on the initial arteriogram, into what is probably the lumen of the duodenum. The vessel was selectively catheterized and coil embolization performed until cessation of flow through the treated segment and no further extravasation evident. There is elsewhere diffuse arterial vasospasm. No other pathologic regions identified. IMPRESSION: 1. Brisk active arterial extravasation from a branch of the gastroduodenal artery probably into the  duodenal lumen. 2. Technically successful coil embolization of the gastroduodenal artery with cessation of extravasation. Electronically Signed   By: Lucrezia Europe M.D.   On: 04/30/2015 12:08   Ir Angiogram Selective Each Additional Vessel  04/30/2015  CLINICAL DATA:  Large volume upper GI bleed. Endoscopy revealed probable duodenal ulcer has active source, incompletely controlled. EXAM: IR EMBO ART VEN HEMORR LYMPH EXTRAV INC GUIDE ROADMAPPING; IR ULTRASOUND GUIDANCE VASC ACCESS LEFT; ARTERIOGRAPHY; SELECTIVE VISCERAL ARTERIOGRAPHY; ADDITIONAL ARTERIOGRAPHY ANESTHESIA/SEDATION: Intravenous Fentanyl and Versed had been administered for previous procedure. No additional medications were administered. Radiology RN provided continuous cardiorespiratory monitoring. MEDICATIONS: Lidocaine 1% subcutaneous 5 mL CONTRAST:  77m OMNIPAQUE IOHEXOL 300 MG/ML  SOLN PROCEDURE: The procedure, risks (including but not limited to bleeding, infection, organ damage ), benefits, and alternatives were explained to the spouse and daughter. Questions regarding the procedure were encouraged and answered. The family understands and consents to the procedure. Because of a indwelling right femoral venous catheter, a left-sided femoral approach was selected. Site was marked, prepped with Betadine, draped in usual sterile fashion, infiltrated locally with 1% lidocaine. Under real-time ultrasound guidance, the left femoral artery was accessed with a 21-gauge micropuncture needle, exchanged over a 018 guidewire for a transitional dilator, through which a 035 guidewire was advanced. A 5 French vascular sheath was placed. Through this a 5 French C2 catheter was coaxial advanced in used to selectively catheterize the celiac axis for selective arteriography. An angled Glidewire was advanced into the gastroduodenal artery but the C2 catheter could not be adequately advanced distally. A coaxial renegade micro catheter with transient guidewire was  then advanced through the C2 and used to selectively catheterize the gastroduodenal artery. Coil embolization of the GDA performed with 3-5 mm coils the cessation of flow. After final arteriography through the C2 catheter, the catheter and sheath were removed and hemostasis achieved with the aid of the Exoseal device after confirmatory femoral arteriography. The patient tolerated the procedure well. COMPLICATIONS: None immediate FINDINGS: Brisk bleeding from a distal branch of the gastroduodenal artery was evident on the initial arteriogram, into what is probably the lumen of the duodenum. The vessel was selectively catheterized and coil embolization performed until cessation of flow through the treated segment and no further extravasation evident. There is elsewhere diffuse arterial vasospasm. No other pathologic regions identified. IMPRESSION: 1. Brisk active arterial extravasation from a branch of the gastroduodenal artery probably into the duodenal lumen. 2. Technically successful coil embolization of the gastroduodenal artery with cessation of extravasation. Electronically Signed   By: DLucrezia EuropeM.D.   On: 04/30/2015 12:08   Ir Angiogram Follow Up Study  04/30/2015  CLINICAL DATA:  Large volume upper GI bleed. Endoscopy revealed probable duodenal ulcer has active source, incompletely controlled. EXAM: IR EMBO ART VEN HEMORR LYMPH EXTRAV INC GUIDE ROADMAPPING; IR ULTRASOUND GUIDANCE VASC ACCESS LEFT; ARTERIOGRAPHY; SELECTIVE VISCERAL ARTERIOGRAPHY; ADDITIONAL ARTERIOGRAPHY ANESTHESIA/SEDATION: Intravenous Fentanyl and Versed had been administered for previous procedure. No additional medications were administered. Radiology RN provided continuous cardiorespiratory monitoring. MEDICATIONS: Lidocaine 1% subcutaneous 5  mL CONTRAST:  81m OMNIPAQUE IOHEXOL 300 MG/ML  SOLN PROCEDURE: The procedure, risks (including but not limited to bleeding, infection, organ damage ), benefits, and alternatives were explained  to the spouse and daughter. Questions regarding the procedure were encouraged and answered. The family understands and consents to the procedure. Because of a indwelling right femoral venous catheter, a left-sided femoral approach was selected. Site was marked, prepped with Betadine, draped in usual sterile fashion, infiltrated locally with 1% lidocaine. Under real-time ultrasound guidance, the left femoral artery was accessed with a 21-gauge micropuncture needle, exchanged over a 018 guidewire for a transitional dilator, through which a 035 guidewire was advanced. A 5 French vascular sheath was placed. Through this a 5 French C2 catheter was coaxial advanced in used to selectively catheterize the celiac axis for selective arteriography. An angled Glidewire was advanced into the gastroduodenal artery but the C2 catheter could not be adequately advanced distally. A coaxial renegade micro catheter with transient guidewire was then advanced through the C2 and used to selectively catheterize the gastroduodenal artery. Coil embolization of the GDA performed with 3-5 mm coils the cessation of flow. After final arteriography through the C2 catheter, the catheter and sheath were removed and hemostasis achieved with the aid of the Exoseal device after confirmatory femoral arteriography. The patient tolerated the procedure well. COMPLICATIONS: None immediate FINDINGS: Brisk bleeding from a distal branch of the gastroduodenal artery was evident on the initial arteriogram, into what is probably the lumen of the duodenum. The vessel was selectively catheterized and coil embolization performed until cessation of flow through the treated segment and no further extravasation evident. There is elsewhere diffuse arterial vasospasm. No other pathologic regions identified. IMPRESSION: 1. Brisk active arterial extravasation from a branch of the gastroduodenal artery probably into the duodenal lumen. 2. Technically successful coil  embolization of the gastroduodenal artery with cessation of extravasation. Electronically Signed   By: DLucrezia EuropeM.D.   On: 04/30/2015 12:08   Ir [place] Vasc Access Left  04/30/2015  CLINICAL DATA:  Large volume upper GI bleed. Endoscopy revealed probable duodenal ulcer has active source, incompletely controlled. EXAM: IR EMBO ART VEN HEMORR LYMPH EXTRAV INC GUIDE ROADMAPPING; IR ULTRASOUND GUIDANCE VASC ACCESS LEFT; ARTERIOGRAPHY; SELECTIVE VISCERAL ARTERIOGRAPHY; ADDITIONAL ARTERIOGRAPHY ANESTHESIA/SEDATION: Intravenous Fentanyl and Versed had been administered for previous procedure. No additional medications were administered. Radiology RN provided continuous cardiorespiratory monitoring. MEDICATIONS: Lidocaine 1% subcutaneous 5 mL CONTRAST:  541mOMNIPAQUE IOHEXOL 300 MG/ML  SOLN PROCEDURE: The procedure, risks (including but not limited to bleeding, infection, organ damage ), benefits, and alternatives were explained to the spouse and daughter. Questions regarding the procedure were encouraged and answered. The family understands and consents to the procedure. Because of a indwelling right femoral venous catheter, a left-sided femoral approach was selected. Site was marked, prepped with Betadine, draped in usual sterile fashion, infiltrated locally with 1% lidocaine. Under real-time ultrasound guidance, the left femoral artery was accessed with a 21-gauge micropuncture needle, exchanged over a 018 guidewire for a transitional dilator, through which a 035 guidewire was advanced. A 5 French vascular sheath was placed. Through this a 5 French C2 catheter was coaxial advanced in used to selectively catheterize the celiac axis for selective arteriography. An angled Glidewire was advanced into the gastroduodenal artery but the C2 catheter could not be adequately advanced distally. A coaxial renegade micro catheter with transient guidewire was then advanced through the C2 and used to selectively catheterize  the gastroduodenal artery. Coil embolization of the  GDA performed with 3-5 mm coils the cessation of flow. After final arteriography through the C2 catheter, the catheter and sheath were removed and hemostasis achieved with the aid of the Exoseal device after confirmatory femoral arteriography. The patient tolerated the procedure well. COMPLICATIONS: None immediate FINDINGS: Brisk bleeding from a distal branch of the gastroduodenal artery was evident on the initial arteriogram, into what is probably the lumen of the duodenum. The vessel was selectively catheterized and coil embolization performed until cessation of flow through the treated segment and no further extravasation evident. There is elsewhere diffuse arterial vasospasm. No other pathologic regions identified. IMPRESSION: 1. Brisk active arterial extravasation from a branch of the gastroduodenal artery probably into the duodenal lumen. 2. Technically successful coil embolization of the gastroduodenal artery with cessation of extravasation. Electronically Signed   By: Lucrezia Europe M.D.   On: 04/30/2015 12:08   Dg Chest Port 1 View  04/29/2015  CLINICAL DATA:  Post intubation. EXAM: PORTABLE CHEST 1 VIEW COMPARISON:  04/29/2015 FINDINGS: Interval placement of an endotracheal tube with tip measuring 2.9 cm above the carina. Shallow inspiration. Normal heart size and pulmonary vascularity. Lungs appear clear. No blunting of costophrenic angles. No pneumothorax. Tortuous aorta. Surgical clips in the right upper quadrant. IMPRESSION: Endotracheal tube tip measures 2.9 cm above the carina. Shallow inspiration. No evidence of active pulmonary disease. Electronically Signed   By: Lucienne Capers M.D.   On: 04/29/2015 22:52   Dg Abd Portable 1v  04/29/2015  CLINICAL DATA:  82 year old female with bloody stool and vomiting blood EXAM: PORTABLE ABDOMEN - 1 VIEW COMPARISON:  None. FINDINGS: Single upright radiograph demonstrates gas within the stomach. No free  air identified. External defibrillator pads project over the left chest and right upper quadrant. The heart is likely mildly enlarged. The lungs appear clear. No acute osseous abnormality. IMPRESSION: 1. No free air visualized. 2. Mild cardiomegaly. 3. The lungs appear clear. Electronically Signed   By: Jacqulynn Cadet M.D.   On: 04/29/2015 21:43   [place] Guide Roadmapping  04/30/2015  CLINICAL DATA:  Large volume upper GI bleed. Endoscopy revealed probable duodenal ulcer has active source, incompletely controlled. EXAM: IR EMBO ART VEN HEMORR LYMPH EXTRAV INC GUIDE ROADMAPPING; IR ULTRASOUND GUIDANCE VASC ACCESS LEFT; ARTERIOGRAPHY; SELECTIVE VISCERAL ARTERIOGRAPHY; ADDITIONAL ARTERIOGRAPHY ANESTHESIA/SEDATION: Intravenous Fentanyl and Versed had been administered for previous procedure. No additional medications were administered. Radiology RN provided continuous cardiorespiratory monitoring. MEDICATIONS: Lidocaine 1% subcutaneous 5 mL CONTRAST:  1m OMNIPAQUE IOHEXOL 300 MG/ML  SOLN PROCEDURE: The procedure, risks (including but not limited to bleeding, infection, organ damage ), benefits, and alternatives were explained to the spouse and daughter. Questions regarding the procedure were encouraged and answered. The family understands and consents to the procedure. Because of a indwelling right femoral venous catheter, a left-sided femoral approach was selected. Site was marked, prepped with Betadine, draped in usual sterile fashion, infiltrated locally with 1% lidocaine. Under real-time ultrasound guidance, the left femoral artery was accessed with a 21-gauge micropuncture needle, exchanged over a 018 guidewire for a transitional dilator, through which a 035 guidewire was advanced. A 5 French vascular sheath was placed. Through this a 5 French C2 catheter was coaxial advanced in used to selectively catheterize the celiac axis for selective arteriography. An angled  Glidewire was advanced into the gastroduodenal artery but the C2 catheter could not be adequately advanced distally. A coaxial renegade micro catheter with transient guidewire was then advanced through  the C2 and used to selectively catheterize the gastroduodenal artery. Coil embolization of the GDA performed with 3-5 mm coils the cessation of flow. After final arteriography through the C2 catheter, the catheter and sheath were removed and hemostasis achieved with the aid of the Exoseal device after confirmatory femoral arteriography. The patient tolerated the procedure well. COMPLICATIONS: None immediate FINDINGS: Brisk bleeding from a distal branch of the gastroduodenal artery was evident on the initial arteriogram, into what is probably the lumen of the duodenum. The vessel was selectively catheterized and coil embolization performed until cessation of flow through the treated segment and no further extravasation evident. There is elsewhere diffuse arterial vasospasm. No other pathologic regions identified. IMPRESSION: 1. Brisk active arterial extravasation from a branch of the gastroduodenal artery probably into the duodenal lumen. 2. Technically successful coil embolization of the gastroduodenal artery with cessation of extravasation. Electronically Signed   By: Lucrezia Europe M.D.   On: 04/30/2015 12:08    Assessment & Plan:   Sophia [name] was seen today for anemia.  Diagnoses and all orders for this visit:  Acute blood loss anemia -     CBC with Differential/Platelet -     CMP14+EGFR  HLD (hyperlipidemia) -     CMP14+EGFR  Essential hypertension -     CMP14+EGFR  Hyperglycemia -     CMP14+EGFR  Hypothyroidism, unspecified hypothyroidism type -     CMP14+EGFR -     TSH + free T4    Probably should have help with mowing and other strenuous chores  I am having Sophia [name] maintain her multivitamin with minerals, Co Q 10, Omega 3, neomycin-polymyxin b-dexamethasone, Calcium-Magnesium,  simvastatin, omeprazole, levothyroxine, and carvedilol.  No orders of the defined types were placed in this encounter.     Follow-up: Return in about 3 months (around 10/22/2015).  Claretta Fraise, M.D.

## 2015-07-23 LAB — CBC WITH DIFFERENTIAL/PLATELET
Basophils Absolute: 0.1 10*3/uL (ref 0.0–0.2)
Basos: 1 %
EOS (ABSOLUTE): 0.3 10*3/uL (ref 0.0–0.4)
Eos: 4 %
Hematocrit: 35.8 % (ref 34.0–46.6)
Hemoglobin: 11.9 g/dL (ref 11.1–15.9)
Immature Grans (Abs): 0 10*3/uL (ref 0.0–0.1)
Immature Granulocytes: 0 %
Lymphocytes Absolute: 1.6 10*3/uL (ref 0.7–3.1)
Lymphs: 24 %
MCH: 29.9 pg (ref 26.6–33.0)
MCHC: 33.2 g/dL (ref 31.5–35.7)
MCV: 90 fL (ref 79–97)
Monocytes Absolute: 0.5 10*3/uL (ref 0.1–0.9)
Monocytes: 8 %
Neutrophils Absolute: 4.1 10*3/uL (ref 1.4–7.0)
Neutrophils: 63 %
Platelets: 319 10*3/uL (ref 150–379)
RBC: 3.98 x10E6/uL (ref 3.77–5.28)
RDW: 16 % — ABNORMAL HIGH (ref 12.3–15.4)
WBC: 6.5 10*3/uL (ref 3.4–10.8)

## 2015-07-23 LAB — CMP14+EGFR
ALT: 16 IU/L (ref 0–32)
AST: 22 IU/L (ref 0–40)
Albumin/Globulin Ratio: 1.3 (ref 1.1–2.5)
Albumin: 3.9 g/dL (ref 3.5–4.7)
Alkaline Phosphatase: 78 IU/L (ref 39–117)
BUN/Creatinine Ratio: 17 (ref 11–26)
BUN: 16 mg/dL (ref 8–27)
Bilirubin Total: 0.5 mg/dL (ref 0.0–1.2)
CO2: 22 mmol/L (ref 18–29)
Calcium: 9.5 mg/dL (ref 8.7–10.3)
Chloride: 105 mmol/L (ref 96–106)
Creatinine, Ser: 0.94 mg/dL (ref 0.57–1.00)
GFR calc Af Amer: 66 mL/min/{1.73_m2} (ref >59)
GFR calc non Af Amer: 57 mL/min/{1.73_m2} — ABNORMAL LOW (ref >59)
Globulin, Total: 2.9 g/dL (ref 1.5–4.5)
Glucose: 97 mg/dL (ref 65–99)
Potassium: 4.1 mmol/L (ref 3.5–5.2)
Sodium: 145 mmol/L — ABNORMAL HIGH (ref 134–144)
Total Protein: 6.8 g/dL (ref 6.0–8.5)

## 2015-07-23 LAB — TSH+FREE T4
Free T4: 1.69 ng/dL (ref 0.82–1.77)
TSH: 0.202 u[IU]/mL — ABNORMAL LOW (ref 0.450–4.500)

## 2015-10-14 ENCOUNTER — Other Ambulatory Visit: Payer: Self-pay | Admitting: Family Medicine

## 2015-11-15 ENCOUNTER — Other Ambulatory Visit: Payer: Self-pay | Admitting: Family Medicine

## 2015-12-30 ENCOUNTER — Other Ambulatory Visit: Payer: Self-pay | Admitting: *Deleted

## 2015-12-30 MED ORDER — OMEPRAZOLE 40 MG PO CPDR: DELAYED_RELEASE_CAPSULE | ORAL | 5 refills | Status: DC

## 2016-01-17 ENCOUNTER — Other Ambulatory Visit: Payer: Self-pay | Admitting: Family Medicine

## 2016-02-04 ENCOUNTER — Ambulatory Visit (INDEPENDENT_AMBULATORY_CARE_PROVIDER_SITE_OTHER): Payer: Medicare Other | Admitting: Family Medicine

## 2016-02-04 ENCOUNTER — Telehealth: Payer: Self-pay | Admitting: Family Medicine

## 2016-02-04 ENCOUNTER — Encounter: Payer: Self-pay | Admitting: Family Medicine

## 2016-02-04 VITALS — BP 190/80 | HR 64 | Temp 97.6°F | Ht 64.0 in | Wt 131.2 lb

## 2016-02-04 DIAGNOSIS — E039 Hypothyroidism, unspecified: Secondary | ICD-10-CM | POA: Diagnosis not present

## 2016-02-04 DIAGNOSIS — E119 Type 2 diabetes mellitus without complications: Secondary | ICD-10-CM | POA: Diagnosis not present

## 2016-02-04 DIAGNOSIS — I10 Essential (primary) hypertension: Secondary | ICD-10-CM | POA: Diagnosis not present

## 2016-02-04 DIAGNOSIS — E785 Hyperlipidemia, unspecified: Secondary | ICD-10-CM | POA: Diagnosis not present

## 2016-02-04 DIAGNOSIS — Z23 Encounter for immunization: Secondary | ICD-10-CM | POA: Diagnosis not present

## 2016-02-04 LAB — BAYER DCA HB A1C WAIVED: HB A1C (BAYER DCA - WAIVED): 6.6 % (ref <7.0)

## 2016-02-04 MED ORDER — BLOOD GLUCOSE MONITOR KIT: PACK | 0 refills | Status: DC

## 2016-02-04 NOTE — Progress Notes (Signed)
Subjective:  Patient ID: Sophia [name], female    DOB: 08-18-1931  Age: 82 y.o. MRN: [id number]  CC: Diabetes (pt here today for routine follow up on diabetes and HTN.)   HPI Sophia [name] presents for  follow-up of hypertension. Patient has no history of headache chest pain or shortness of breath or recent cough. Patient also denies symptoms of TIA such as numbness weakness lateralizing. Patient checks  blood pressure at home and has not had any elevated readings recently. Patient denies side effects from his medication. States taking it regularly.  Patient also  in for follow-up of elevated cholesterol. Doing well without complaints on current medication. Denies side effects of statin including myalgia and arthralgia and nausea. Also in today for liver function testing. Currently no chest pain, shortness of breath or other cardiovascular related symptoms noted.  Follow-up of diabetes. Patient does not check blood sugar at home.Patient denies symptoms such as polyuria, polydipsia, excessive hunger, nausea No significant hypoglycemic spells noted. On no DM meds.  Patient presents for follow-up on  thyroid. The patient has a history of hypothyroidism for many years. It has been stable recently. Pt. denies any change in  voice, loss of hair, heat or cold intolerance. Energy level has been adequate to good. Patient denies constipation and diarrhea. No myxedema. Medication is as noted below. Verified that pt is taking it daily on an empty stomach. Well tolerated.   History Sophia [name] has a past medical history of Chronic kidney disease; Diabetes mellitus without complication ([place]); Hyperlipidemia; and Hypertension.   She has a past surgical history that includes Cholecystectomy; Sigmoidoscopy; and Esophagogastroduodenoscopy (N/A, 04/29/2015).   Her family history includes Colon cancer in her sister; Heart disease in her brother, father, and sister.She reports that she has never smoked.  She has never used smokeless tobacco. She reports that she does not drink alcohol or use drugs.  Current Outpatient Prescriptions on File Prior to Visit  Medication Sig Dispense Refill  . Calcium-Magnesium 500-250 MG TABS Take 1 tablet by mouth daily with supper. 30 each 5  . carvedilol (COREG) 6.25 MG tablet TAKE ONE TABLET BY MOUTH TWICE DAILY WITH MEALS FOR BLOOD PRESSURE/HEART/KIDNEY 60 tablet 0  . Coenzyme Q10 (CO Q 10) 100 MG CAPS Take 100 mg by mouth daily as needed (takes once in while).     Marland Kitchen levothyroxine (SYNTHROID, LEVOTHROID) 75 MCG tablet Take 1/2 each morning on an empty stomach one hour before breakfast. 30 tablet 5  . Multiple Vitamins-Minerals (MULTIVITAMIN WITH MINERALS) tablet Take 1 tablet by mouth daily.    Marland Kitchen neomycin-polymyxin b-dexamethasone (MAXITROL) 3.5-10000-0.1 OINT Place 1 application into the right eye at bedtime. Reported on 06/04/2015    . Omega 3 1200 MG CAPS Take 1,200 mg by mouth daily as needed (takes once in while).     Marland Kitchen omeprazole (PRILOSEC) 40 MG capsule Take on an empty stomach one hour before breakfast and another at bedtime, two hours after your last food or drink (except Water) 60 capsule 5  . simvastatin (ZOCOR) 40 MG tablet Take 0.5 tablets (20 mg total) by mouth daily at 6 PM. At suppertime 90 tablet 1   No current facility-administered medications on file prior to visit.     ROS Review of Systems  Constitutional: Positive for fatigue (seems tired more easily recently). Negative for activity change, appetite change and fever.  HENT: Negative for congestion, rhinorrhea and sore throat.   Eyes: Negative for visual disturbance.  Respiratory: Negative for cough and shortness  of breath.   Cardiovascular: Negative for chest pain and palpitations.  Gastrointestinal: Negative for abdominal pain, diarrhea and nausea.  Genitourinary: Negative for dysuria.  Musculoskeletal: Negative for arthralgias and myalgias.    Objective:  BP (!) 189/82   Pulse  64   Temp 97.6 F (36.4 C) (Oral)   Ht '5\' 4"'  (1.626 m)   Wt 131 lb 4 oz (59.5 kg)   BMI 22.53 kg/m   BP Readings from Last 3 Encounters:  02/04/16 (!) 189/82  07/22/15 (!) 165/83  06/04/15 (!) 181/82    Wt Readings from Last 3 Encounters:  02/04/16 131 lb 4 oz (59.5 kg)  07/22/15 122 lb 6.4 oz (55.5 kg)  06/04/15 127 lb 6.4 oz (57.8 kg)     Physical Exam  Constitutional: She is oriented to person, place, and time. She appears well-developed and well-nourished. No distress.  HENT:  Head: Normocephalic and atraumatic.  Right Ear: External ear normal.  Left Ear: External ear normal.  Nose: Nose normal.  Mouth/Throat: Oropharynx is clear and moist.  Eyes: Conjunctivae and EOM are normal. Pupils are equal, round, and reactive to light.  Neck: Normal range of motion. Neck supple. No thyromegaly present.  Cardiovascular: Normal rate, regular rhythm and normal heart sounds.   No murmur heard. Pulmonary/Chest: Effort normal and breath sounds normal. No respiratory distress. She has no wheezes. She has no rales.  Abdominal: Soft. Bowel sounds are normal. She exhibits no distension. There is no tenderness.  Lymphadenopathy:    She has no cervical adenopathy.  Neurological: She is alert and oriented to person, place, and time. She has normal reflexes.  Skin: Skin is warm and dry.  Psychiatric: She has a normal mood and affect. Her behavior is normal. Judgment and thought content normal.    Lab Results  Component Value Date   HGBA1C 6.0 (H) 05/03/2015   HGBA1C 5.6 04/30/2015   HGBA1C 6.6 12/03/2014    Lab Results  Component Value Date   WBC 6.5 07/22/2015   HGB 10.1 (L) 05/08/2015   HCT 35.8 07/22/2015   PLT 319 07/22/2015   GLUCOSE 97 07/22/2015   CHOL 196 06/04/2015   TRIG 250 (H) 06/04/2015   HDL 41 06/04/2015   LDLCALC 105 (H) 06/04/2015   ALT 16 07/22/2015   AST 22 07/22/2015   NA 145 (H) 07/22/2015   K 4.1 07/22/2015   CL 105 07/22/2015   CREATININE 0.94  07/22/2015   BUN 16 07/22/2015   CO2 22 07/22/2015   TSH 0.202 (L) 07/22/2015   INR 1.58 (H) 05/03/2015   HGBA1C 6.0 (H) 05/03/2015     Assessment & Plan:   Sophia [name] was seen today for diabetes.  Diagnoses and all orders for this visit:  Diabetes mellitus without complication ([place]) -     Bayer DCA Hb A1c Waived -     CBC with Differential/Platelet -     CMP14+EGFR -     Lipid panel -     TSH + free T4  Hypothyroidism, unspecified hypothyroidism type -     TSH + free T4  Essential hypertension -     CBC with Differential/Platelet -     CMP14+EGFR -     Lipid panel -     TSH + free T4  Hyperlipidemia -     Lipid panel  Other orders -     blood glucose meter kit and supplies KIT; Dispense based on patient and insurance preference. Use up to four times daily  as directed. (FOR ICD-9 250.00, 250.01).   I am having Sophia [name] start on blood glucose meter kit and supplies. I am also having her maintain her multivitamin with minerals, Co Q 10, Omega 3, neomycin-polymyxin b-dexamethasone, Calcium-Magnesium, simvastatin, levothyroxine, omeprazole, and carvedilol.  Meds ordered this encounter  Medications  . blood glucose meter kit and supplies KIT    Sig: Dispense based on patient and insurance preference. Use up to four times daily as directed. (FOR ICD-9 250.00, 250.01).    Dispense:  1 each    Refill:  0    Order Specific Question:   Number of strips    Answer:   100    Order Specific Question:   Number of lancets    Answer:   100   A1C=6.6 Check your blood pressure 3 times each morning about a minute apart from each other. Then check it again 3 times each evening. Do this daily for 1 week. Right each reading down as you do it and bring them with you to your follow-up appointment in 2 weeks.  Monitor given along with instruction on use BID. Log sheets given.  Handouts for carb counting reviewed, printed for pt. DM and exercise given.  Follow-up: Return in about 2  weeks (around 02/18/2016).  Claretta Fraise, M.D.

## 2016-02-04 NOTE — Patient Instructions (Addendum)
Check your blood pressure 3 times each morning about a minute apart from each other. Then check it again 3 times each evening. Do this daily for 1 week. Right each reading down as you do it and bring them with you to your follow-up appointment in 2 weeks.  Diabetes and Exercise Exercising regularly is important. It is not just about losing weight. It has many health benefits, such as:  Improving your overall fitness, flexibility, and endurance.  Increasing your bone density.  Helping with weight control.  Decreasing your body fat.  Increasing your muscle strength.  Reducing stress and tension.  Improving your overall health. People with diabetes who exercise gain additional benefits because exercise:  Reduces appetite.  Improves the body's use of blood sugar (glucose).  Helps lower or control blood glucose.  Decreases blood pressure.  Helps control blood lipids (such as cholesterol and triglycerides).  Improves the body's use of the hormone insulin by:  Increasing the body's insulin sensitivity.  Reducing the body's insulin needs.  Decreases the risk for heart disease because exercising:  Lowers cholesterol and triglycerides levels.  Increases the levels of good cholesterol (such as high-density lipoproteins [HDL]) in the body.  Lowers blood glucose levels. YOUR ACTIVITY PLAN  Choose an activity that you enjoy, and set realistic goals. To exercise safely, you should begin practicing any new physical activity slowly, and gradually increase the intensity of the exercise over time. Your health care provider or diabetes educator can help create an activity plan that works for you. General recommendations include:  Encouraging children to engage in at least 60 minutes of physical activity each day.  Stretching and performing strength training exercises, such as yoga or weight lifting, at least 2 times per week.  Performing a total of at least 150 minutes of  moderate-intensity exercise each week, such as brisk walking or water aerobics.  Exercising at least 3 days per week, making sure you allow no more than 2 consecutive days to pass without exercising.  Avoiding long periods of inactivity (90 minutes or more). When you have to spend an extended period of time sitting down, take frequent breaks to walk or stretch. RECOMMENDATIONS FOR EXERCISING WITH TYPE 1 OR TYPE 2 DIABETES   Check your blood glucose before exercising. If blood glucose levels are greater than 240 mg/dL, check for urine ketones. Do not exercise if ketones are present.  Avoid injecting insulin into areas of the body that are going to be exercised. For example, avoid injecting insulin into:  The arms when playing tennis.  The legs when jogging.  Keep a record of:  Food intake before and after you exercise.  Expected peak times of insulin action.  Blood glucose levels before and after you exercise.  The type and amount of exercise you have done.  Review your records with your health care provider. Your health care provider will help you to develop guidelines for adjusting food intake and insulin amounts before and after exercising.  If you take insulin or oral hypoglycemic agents, watch for signs and symptoms of hypoglycemia. They include:  Dizziness.  Shaking.  Sweating.  Chills.  Confusion.  Drink plenty of water while you exercise to prevent dehydration or heat stroke. Body water is lost during exercise and must be replaced.  Talk to your health care provider before starting an exercise program to make sure it is safe for you. Remember, almost any type of activity is better than none.   This information is not intended  to replace advice given to you by your health care provider. Make sure you discuss any questions you have with your health care provider.   Document Released: 07/25/2003 Document Revised: 09/18/2014 Document Reviewed: 10/11/2012 Elsevier  Interactive Patient Education 2016 [place] Carbohydrate Counting for Diabetes Mellitus Carbohydrate counting is a method for keeping track of the amount of carbohydrates you eat. Eating carbohydrates naturally increases the level of sugar (glucose) in your blood, so it is important for you to know the amount that is okay for you to have in every meal. Carbohydrate counting helps keep the level of glucose in your blood within normal limits. The amount of carbohydrates allowed is different for every person. A dietitian can help you calculate the amount that is right for you. Once you know the amount of carbohydrates you can have, you can count the carbohydrates in the foods you want to eat. Carbohydrates are found in the following foods:  Grains, such as breads and cereals.  Dried beans and soy products.  Starchy vegetables, such as potatoes, peas, and corn.  Fruit and fruit juices.  Milk and yogurt.  Sweets and snack foods, such as cake, cookies, candy, chips, soft drinks, and fruit drinks. CARBOHYDRATE COUNTING There are two ways to count the carbohydrates in your food. You can use either of the methods or a combination of both. Reading the "Nutrition Facts" on [place] The "Nutrition Facts" is an area that is included on the labels of almost all packaged food and beverages in the [place]. It includes the serving size of that food or beverage and information about the nutrients in each serving of the food, including the grams (g) of carbohydrate per serving.  Decide the number of servings of this food or beverage that you will be able to eat or drink. Multiply that number of servings by the number of grams of carbohydrate that is listed on the label for that serving. The total will be the amount of carbohydrates you will be having when you eat or drink this food or beverage. Learning Standard Serving Sizes of Food When you eat food that is not packaged or does not  include "Nutrition Facts" on the label, you need to measure the servings in order to count the amount of carbohydrates.A serving of most carbohydrate-rich foods contains about 15 g of carbohydrates. The following list includes serving sizes of carbohydrate-rich foods that provide 15 g ofcarbohydrate per serving:   1 slice of bread (1 oz) or 1 six-inch tortilla.    of a hamburger bun or English muffin.  4-6 crackers.   cup unsweetened dry cereal.    cup hot cereal.   cup rice or pasta.    cup mashed potatoes or  of a large baked potato.  1 cup fresh fruit or one small piece of fruit.    cup canned or frozen fruit or fruit juice.  1 cup milk.   cup plain fat-free yogurt or yogurt sweetened with artificial sweeteners.   cup cooked dried beans or starchy vegetable, such as peas, corn, or potatoes.  Decide the number of standard-size servings that you will eat. Multiply that number of servings by 15 (the grams of carbohydrates in that serving). For example, if you eat 2 cups of strawberries, you will have eaten 2 servings and 30 g of carbohydrates (2 servings x 15 g = 30 g). For foods such as soups and casseroles, in which more than one food is mixed in, you will  need to count the carbohydrates in each food that is included. EXAMPLE OF CARBOHYDRATE COUNTING Sample Dinner  3 oz chicken breast.   cup of brown rice.   cup of corn.  1 cup milk.   1 cup strawberries with sugar-free whipped topping.  Carbohydrate Calculation Step 1: Identify the foods that contain carbohydrates:   Rice.   Corn.   Milk.   Strawberries. Step 2:Calculate the number of servings eaten of each:   2 servings of rice.   1 serving of corn.   1 serving of milk.   1 serving of strawberries. Step 3: Multiply each of those number of servings by 15 g:   2 servings of rice x 15 g = 30 g.   1 serving of corn x 15 g = 15 g.   1 serving of milk x 15 g = 15 g.   1  serving of strawberries x 15 g = 15 g. Step 4: Add together all of the amounts to find the total grams of carbohydrates eaten: 30 g + 15 g + 15 g + 15 g = 75 g.   This information is not intended to replace advice given to you by your health care provider. Make sure you discuss any questions you have with your health care provider.   Document Released: 05/04/2005 Document Revised: 05/25/2014 Document Reviewed: 03/31/2013 Elsevier Interactive Patient Education [employer].

## 2016-02-05 ENCOUNTER — Other Ambulatory Visit: Payer: Self-pay

## 2016-02-05 ENCOUNTER — Other Ambulatory Visit: Payer: Self-pay | Admitting: Family Medicine

## 2016-02-05 DIAGNOSIS — R7989 Other specified abnormal findings of blood chemistry: Secondary | ICD-10-CM

## 2016-02-05 LAB — CBC WITH DIFFERENTIAL/PLATELET
Basophils Absolute: 0.1 10*3/uL (ref 0.0–0.2)
Basos: 1 %
EOS (ABSOLUTE): 0.3 10*3/uL (ref 0.0–0.4)
Eos: 5 %
Hematocrit: 36.2 % (ref 34.0–46.6)
Hemoglobin: 12 g/dL (ref 11.1–15.9)
Immature Grans (Abs): 0 10*3/uL (ref 0.0–0.1)
Immature Granulocytes: 0 %
Lymphocytes Absolute: 2 10*3/uL (ref 0.7–3.1)
Lymphs: 31 %
MCH: 31.1 pg (ref 26.6–33.0)
MCHC: 33.1 g/dL (ref 31.5–35.7)
MCV: 94 fL (ref 79–97)
Monocytes Absolute: 0.4 10*3/uL (ref 0.1–0.9)
Monocytes: 6 %
Neutrophils Absolute: 3.7 10*3/uL (ref 1.4–7.0)
Neutrophils: 57 %
Platelets: 264 10*3/uL (ref 150–379)
RBC: 3.86 x10E6/uL (ref 3.77–5.28)
RDW: 13.8 % (ref 12.3–15.4)
WBC: 6.5 10*3/uL (ref 3.4–10.8)

## 2016-02-05 LAB — CMP14+EGFR
ALT: 11 IU/L (ref 0–32)
AST: 17 IU/L (ref 0–40)
Albumin/Globulin Ratio: 1.6 (ref 1.2–2.2)
Albumin: 4.1 g/dL (ref 3.5–4.7)
Alkaline Phosphatase: 63 IU/L (ref 39–117)
BUN/Creatinine Ratio: 16 (ref 12–28)
BUN: 20 mg/dL (ref 8–27)
Bilirubin Total: 0.5 mg/dL (ref 0.0–1.2)
CO2: 24 mmol/L (ref 18–29)
Calcium: 9.5 mg/dL (ref 8.7–10.3)
Chloride: 106 mmol/L (ref 96–106)
Creatinine, Ser: 1.26 mg/dL — ABNORMAL HIGH (ref 0.57–1.00)
GFR calc Af Amer: 46 mL/min/{1.73_m2} — ABNORMAL LOW (ref >59)
GFR calc non Af Amer: 40 mL/min/{1.73_m2} — ABNORMAL LOW (ref >59)
Globulin, Total: 2.6 g/dL (ref 1.5–4.5)
Glucose: 101 mg/dL — ABNORMAL HIGH (ref 65–99)
Potassium: 4.5 mmol/L (ref 3.5–5.2)
Sodium: 145 mmol/L — ABNORMAL HIGH (ref 134–144)
Total Protein: 6.7 g/dL (ref 6.0–8.5)

## 2016-02-05 LAB — LIPID PANEL
Chol/HDL Ratio: 3.6 ratio units (ref 0.0–4.4)
Cholesterol, Total: 188 mg/dL (ref 100–199)
HDL: 52 mg/dL (ref >39)
LDL Calculated: 101 mg/dL — ABNORMAL HIGH (ref 0–99)
Triglycerides: 173 mg/dL — ABNORMAL HIGH (ref 0–149)
VLDL Cholesterol Cal: 35 mg/dL (ref 5–40)

## 2016-02-05 LAB — TSH+FREE T4
Free T4: 1 ng/dL (ref 0.82–1.77)
TSH: 7.21 u[IU]/mL — ABNORMAL HIGH (ref 0.450–4.500)

## 2016-02-05 MED ORDER — BLOOD GLUCOSE MONITOR KIT: PACK | 0 refills | Status: DC

## 2016-02-05 MED ORDER — LEVOTHYROXINE SODIUM 75 MCG PO TABS: ORAL_TABLET | ORAL | 5 refills | Status: DC

## 2016-02-05 NOTE — Telephone Encounter (Signed)
Called patient and she states something was wrong with a RX. Tried to call pharmacy and they are not open yet. Will try to call them again.

## 2016-02-19 ENCOUNTER — Ambulatory Visit: Payer: Medicare Other | Admitting: Family Medicine

## 2016-02-19 DIAGNOSIS — H578 Other specified disorders of eye and adnexa: Secondary | ICD-10-CM | POA: Diagnosis not present

## 2016-02-20 NOTE — Telephone Encounter (Signed)
Patient states that she was able to get the [facility] monitor that was requested.

## 2016-03-07 ENCOUNTER — Other Ambulatory Visit: Payer: Self-pay | Admitting: Family Medicine

## 2016-06-01 ENCOUNTER — Emergency Department (HOSPITAL_COMMUNITY)
Admission: EM | Admit: 2016-06-01 | Discharge: 2016-06-01 | Disposition: A | Discharge status: Home / Self Care | Payer: Medicare Other | Attending: Emergency Medicine | Admitting: Emergency Medicine

## 2016-06-01 ENCOUNTER — Emergency Department (HOSPITAL_COMMUNITY): Payer: Medicare Other

## 2016-06-01 ENCOUNTER — Encounter (HOSPITAL_COMMUNITY): Payer: Self-pay | Admitting: Emergency Medicine

## 2016-06-01 DIAGNOSIS — R109 Unspecified abdominal pain: Secondary | ICD-10-CM | POA: Insufficient documentation

## 2016-06-01 DIAGNOSIS — N189 Chronic kidney disease, unspecified: Secondary | ICD-10-CM | POA: Diagnosis not present

## 2016-06-01 DIAGNOSIS — Z79899 Other long term (current) drug therapy: Secondary | ICD-10-CM | POA: Insufficient documentation

## 2016-06-01 DIAGNOSIS — I10 Essential (primary) hypertension: Secondary | ICD-10-CM | POA: Diagnosis not present

## 2016-06-01 DIAGNOSIS — E1122 Type 2 diabetes mellitus with diabetic chronic kidney disease: Secondary | ICD-10-CM | POA: Diagnosis not present

## 2016-06-01 DIAGNOSIS — I129 Hypertensive chronic kidney disease with stage 1 through stage 4 chronic kidney disease, or unspecified chronic kidney disease: Secondary | ICD-10-CM | POA: Diagnosis not present

## 2016-06-01 DIAGNOSIS — R0682 Tachypnea, not elsewhere classified: Secondary | ICD-10-CM | POA: Diagnosis not present

## 2016-06-01 DIAGNOSIS — R1031 Right lower quadrant pain: Secondary | ICD-10-CM | POA: Diagnosis not present

## 2016-06-01 DIAGNOSIS — R1011 Right upper quadrant pain: Secondary | ICD-10-CM | POA: Diagnosis not present

## 2016-06-01 LAB — URINALYSIS, ROUTINE W REFLEX MICROSCOPIC
Bilirubin Urine: NEGATIVE
Glucose, UA: NEGATIVE mg/dL
Hgb urine dipstick: NEGATIVE
Ketones, ur: NEGATIVE mg/dL
Leukocytes, UA: NEGATIVE
Nitrite: NEGATIVE
Protein, ur: NEGATIVE mg/dL
Specific Gravity, Urine: 1.019 (ref 1.005–1.030)
pH: 5 (ref 5.0–8.0)

## 2016-06-01 LAB — HEPATIC FUNCTION PANEL
ALT: 10 U/L — ABNORMAL LOW (ref 14–54)
AST: 17 U/L (ref 15–41)
Albumin: 3.3 g/dL — ABNORMAL LOW (ref 3.5–5.0)
Alkaline Phosphatase: 55 U/L (ref 38–126)
Bilirubin, Direct: 0.2 mg/dL (ref 0.1–0.5)
Indirect Bilirubin: 0.5 mg/dL (ref 0.3–0.9)
Total Bilirubin: 0.7 mg/dL (ref 0.3–1.2)
Total Protein: 6.8 g/dL (ref 6.5–8.1)

## 2016-06-01 LAB — CBC
HCT: 36.4 % (ref 36.0–46.0)
Hemoglobin: 12 g/dL (ref 12.0–15.0)
MCH: 31 pg (ref 26.0–34.0)
MCHC: 33 g/dL (ref 30.0–36.0)
MCV: 94.1 fL (ref 78.0–100.0)
Platelets: 238 10*3/uL (ref 150–400)
RBC: 3.87 MIL/uL (ref 3.87–5.11)
RDW: 12.6 % (ref 11.5–15.5)
WBC: 11.8 10*3/uL — ABNORMAL HIGH (ref 4.0–10.5)

## 2016-06-01 LAB — BASIC METABOLIC PANEL
Anion gap: 11 (ref 5–15)
BUN: 13 mg/dL (ref 6–20)
CO2: 23 mmol/L (ref 22–32)
Calcium: 9.1 mg/dL (ref 8.9–10.3)
Chloride: 106 mmol/L (ref 101–111)
Creatinine, Ser: 1.06 mg/dL — ABNORMAL HIGH (ref 0.44–1.00)
GFR calc Af Amer: 55 mL/min — ABNORMAL LOW (ref >60)
GFR calc non Af Amer: 48 mL/min — ABNORMAL LOW (ref >60)
Glucose, Bld: 141 mg/dL — ABNORMAL HIGH (ref 65–99)
Potassium: 3.7 mmol/L (ref 3.5–5.1)
Sodium: 140 mmol/L (ref 135–145)

## 2016-06-01 LAB — LIPASE, BLOOD: Lipase: 20 U/L (ref 11–51)

## 2016-06-01 MED ORDER — IOPAMIDOL (ISOVUE-300) INJECTION 61%
INTRAVENOUS | Status: AC
  Administered 2016-06-01: 75 mL
  Filled 2016-06-01: qty 75

## 2016-06-01 MED ORDER — OXYCODONE-ACETAMINOPHEN 5-325 MG PO TABS: 1.0000 | ORAL_TABLET | Freq: Four times a day (QID) | ORAL | 0 refills | Status: DC | PRN

## 2016-06-01 MED ORDER — FENTANYL CITRATE (PF) 100 MCG/2ML IJ SOLN
100.0000 ug | Freq: Once | INTRAMUSCULAR | Status: AC
  Administered 2016-06-01: 100 ug via INTRAVENOUS
  Filled 2016-06-01: qty 2

## 2016-06-01 MED ORDER — ONDANSETRON HCL 4 MG/2ML IJ SOLN
4.0000 mg | Freq: Once | INTRAMUSCULAR | Status: AC
  Administered 2016-06-01: 4 mg via INTRAVENOUS
  Filled 2016-06-01: qty 2

## 2016-06-01 NOTE — ED Notes (Signed)
EDP at bedside  

## 2016-06-01 NOTE — ED Notes (Signed)
RN contacted CT at this time about delay in scan. EDP made aware.

## 2016-06-01 NOTE — ED Provider Notes (Signed)
[facility] DEPT Provider Note   CSN: [id number] Arrival date & time: 06/01/16  9166     History   Chief Complaint Chief Complaint  Patient presents with  . Flank Pain    HPI Sophia [name] is a 83 y.o. female.  The history is provided by the patient.  Flank Pain  This is a new problem. The current episode started yesterday. The problem occurs constantly. The problem has been rapidly worsening. Associated symptoms include abdominal pain. Pertinent negatives include no chest pain and no shortness of breath. Exacerbated by: movement/palpation/breathing. Nothing relieves the symptoms. She has tried acetaminophen for the symptoms. The treatment provided no relief.  Patient with h/o diabetes and hypertension presents with right sided abdominal/flank pain for past day No fever/vomiting No CP but she reports her abdomen hurts worse with breathing She does not recall having this issue previously  Past Medical History:  Diagnosis Date  . Chronic kidney disease   . Diabetes mellitus without complication ([place])   . Hyperlipidemia   . Hypertension     Patient Active Problem List   Diagnosis Date Noted  . Acute kidney injury superimposed on CKD ([place]) 05/03/2015  . Acute blood loss anemia 05/03/2015  . Thyroid activity decreased   . Acute respiratory failure with hypoxia ([place]) 04/30/2015  . Gastrointestinal hemorrhage associated with duodenal ulcer   . Diabetes mellitus without complication ([place])   . Chronic kidney disease   . Hyperlipidemia 10/24/2012  . Essential hypertension 10/24/2012  . Osteoporosis, unspecified 10/24/2012  . Unspecified vitamin D deficiency 10/24/2012  . Hyperglycemia 10/24/2012  . Benign neoplasm of colon 07/20/2011  . Diverticulosis of colon (without mention of hemorrhage) 07/20/2011    Past Surgical History:  Procedure Laterality Date  . CHOLECYSTECTOMY    . ESOPHAGOGASTRODUODENOSCOPY N/A 04/29/2015   Procedure: ESOPHAGOGASTRODUODENOSCOPY  (EGD);  Surgeon: Clarene Essex, MD;  Location: [facility] ENDOSCOPY;  Service: Endoscopy;  Laterality: N/A;  . SIGMOIDOSCOPY      OB History    No data available       Home Medications    Prior to Admission medications   Medication Sig Start Date End Date Taking? Authorizing Provider  blood glucose meter kit and supplies KIT Use up to 4x daily prn 02/05/16   Claretta Fraise, MD  Calcium-Magnesium 500-250 MG TABS Take 1 tablet by mouth daily with supper. 06/04/15   Claretta Fraise, MD  carvedilol (COREG) 6.25 MG tablet TAKE ONE TABLET BY MOUTH TWICE DAILY WITH MEALS FOR BLOOD PRESSURE/HEART/KIDNEY 03/09/16   Claretta Fraise, MD  Coenzyme Q10 (CO Q 10) 100 MG CAPS Take 100 mg by mouth daily as needed (takes once in while).     Historical Provider, MD  levothyroxine (SYNTHROID, LEVOTHROID) 75 MCG tablet Take 1 each morning on an empty stomach one hour before breakfast. 02/05/16   Claretta Fraise, MD  Multiple Vitamins-Minerals (MULTIVITAMIN WITH MINERALS) tablet Take 1 tablet by mouth daily.    Historical Provider, MD  neomycin-polymyxin b-dexamethasone (MAXITROL) 3.5-10000-0.1 OINT Place 1 application into the right eye at bedtime. Reported on 06/04/2015 04/09/15   Historical Provider, MD  Omega 3 1200 MG CAPS Take 1,200 mg by mouth daily as needed (takes once in while).     Historical Provider, MD  omeprazole (PRILOSEC) 40 MG capsule Take on an empty stomach one hour before breakfast and another at bedtime, two hours after your last food or drink (except Water) 12/30/15   Claretta Fraise, MD  simvastatin (ZOCOR) 40 MG tablet Take 0.5 tablets (20 mg  total) by mouth daily at 6 PM. At suppertime 06/04/15   Claretta Fraise, MD    Family History Family History  Problem Relation Age of Onset  . Heart disease Father   . Colon cancer Sister   . Heart disease Brother   . Heart disease Sister     Social History Social History  Substance Use Topics  . Smoking status: Never Smoker  . Smokeless tobacco: Never Used  .  Alcohol use No     Allergies   Levothyroxine   Review of Systems Review of Systems  Constitutional: Negative for fever.  Respiratory: Negative for shortness of breath.   Cardiovascular: Negative for chest pain.  Gastrointestinal: Positive for abdominal pain. Negative for constipation.  Genitourinary: Positive for flank pain.  All other systems reviewed and are negative.    Physical Exam Updated Vital Signs BP 181/80 (BP Location: Right Arm)   Pulse 63   Temp 98 F (36.7 C) (Oral)   Resp 16   Ht 5' 2" (1.575 m)   Wt 58.1 kg   SpO2 98%   BMI 23.41 kg/m   Physical Exam CONSTITUTIONAL: elderly, uncomfortable appearing HEAD: Normocephalic/atraumatic EYES: EOMI ENMT: Mucous membranes moist NECK: supple no meningeal signs SPINE/BACK:entire spine nontender CV: S1/S2 noted, no murmurs/rubs/gallops noted LUNGS: Lungs are clear to auscultation bilaterally, no apparent distress ABDOMEN: soft, distended.  Diffuse right sided abdominal tenderness.  Decreased bowel sounds noted throughout abdomen GU:no cva tenderness NEURO: Pt is awake/alert/appropriate, moves all extremitiesx4.  No facial droop.   EXTREMITIES: pulses normal/equal, full ROM SKIN: warm, color normal PSYCH: no abnormalities of mood noted, alert and oriented to situation   ED Treatments / Results  Labs (all labs ordered are listed, but only abnormal results are displayed) Labs Reviewed  BASIC METABOLIC PANEL - Abnormal; Notable for the following:       Result Value   Glucose, Bld 141 (*)    Creatinine, Ser 1.06 (*)    GFR calc non Af Amer 48 (*)    GFR calc Af Amer 55 (*)    All other components within normal limits  CBC - Abnormal; Notable for the following:    WBC 11.8 (*)    All other components within normal limits  HEPATIC FUNCTION PANEL - Abnormal; Notable for the following:    Albumin 3.3 (*)    ALT 10 (*)    All other components within normal limits  URINE CULTURE  LIPASE, BLOOD  URINALYSIS,  ROUTINE W REFLEX MICROSCOPIC    EKG  EKG Interpretation  Date/Time:  Monday June 01 2016 06:18:39 EST Ventricular Rate:  63 PR Interval:    QRS Duration: 98 QT Interval:  432 QTC Calculation: 443 R Axis:   50 Text Interpretation:  Sinus rhythm Non-specific ST-t changes Interpretation limited secondary to artifact Confirmed by Christy Gentles  MD, [place] ([id number]) on 06/01/2016 6:21:24 AM       Radiology Dg Abdomen Acute W/chest  Result Date: 06/01/2016 CLINICAL DATA:  Diffuse abdominal pain. Right upper quadrant pain tonight. EXAM: DG ABDOMEN ACUTE W/ 1V CHEST COMPARISON:  Portable abdominal radiograph 04/29/2015, chest radiograph 05/04/2015 FINDINGS: Cardiomegaly is stable. Low lung volumes. Prominent interstitial markings are new from prior exam. No pleural fluid. No free intra- abdominal air. Air-filled small bowel loops in the central and left abdomen, prominent but nondilated. Small volume of colonic stool. Coils in the central upper abdomen. Cholecystectomy clips in the right upper quadrant. There are pelvic phleboliths. Scattered vascular calcifications. Scoliotic curvature in degenerative  change in the spine. IMPRESSION: 1. Nonobstructive bowel gas pattern. Air within nondilated bowel loops in the central and lower abdomen are nonspecific, may be normal or represent ileus. 2. Low lung volumes. Increased interstitial markings, difficult to exclude pulmonary edema. Cardiomegaly is grossly stable. Electronically Signed   By: Jeb Levering M.D.   On: 06/01/2016 06:40    Procedures Procedures (including critical care time)  Medications Ordered in ED Medications  ondansetron (ZOFRAN) injection 4 mg (4 mg Intravenous Given 06/01/16 0645)  fentaNYL (SUBLIMAZE) injection 100 mcg (100 mcg Intravenous Given 06/01/16 0645)     Initial Impression / Assessment and Plan / ED Course  I have reviewed the triage vital signs and the nursing notes.  Pertinent labs & imaging results that were  available during my care of the patient were reviewed by me and considered in my medical decision making (see chart for details).  Clinical Course     7:33 AM Pt with right sided ABD/flank pain over past day Initially she had significant tenderness.  This has improved but still having focal tenderness Acute abd series does not reveal acute emergent process Will need CT imaging Pt agreeable Signed out to dr linker to f/u on CT imaging   Final Clinical Impressions(s) / ED Diagnoses   Final diagnoses:  None    New Prescriptions New Prescriptions   No medications on file     Ripley Fraise, MD 06/01/16 [phone]

## 2016-06-01 NOTE — Discharge Instructions (Signed)
Return to the ED with any concerns including vomiting and not able to keep down liquids or your medications, abdominal pain especially if it localizes to the right lower abdomen, fever or chills, and decreased urine output, decreased level of alertness or lethargy, or any other alarming symptoms.   The CT scan of your abdomen and pelvis did not show any acute findings.  There was a finding of a mild compression fracture of L1 (in your lower back) this appear chronic on the CT scan.

## 2016-06-01 NOTE — ED Triage Notes (Signed)
Per [facility]  Pt coming form home. Approx dinner time last night on R flank. Feels like a twisting and tender with palpation. Took some tylenol without relief. No other complaints voiced.

## 2016-06-01 NOTE — ED Notes (Signed)
Pt. Ambulatory to restroom with 1 assist at this time.

## 2016-06-01 NOTE — ED Notes (Signed)
Contacted CT to let them know that patient CT not 'in process' or 'resulted'. They state they will check into this matter.

## 2016-06-03 ENCOUNTER — Ambulatory Visit: Payer: Medicare Other | Admitting: Family Medicine

## 2016-06-03 LAB — URINE CULTURE: Culture: >=100000 — AB

## 2016-06-04 ENCOUNTER — Telehealth (HOSPITAL_BASED_OUTPATIENT_CLINIC_OR_DEPARTMENT_OTHER): Payer: Self-pay

## 2016-06-04 NOTE — Telephone Encounter (Signed)
Post ED Visit - Positive Culture Follow-up: Successful Patient Follow-Up  Culture assessed and recommendations reviewed by: []  Elenor Quinones, Pharm.D. []  Heide Guile, Pharm.D., BCPS []  Parks Neptune, Pharm.D. []  Alycia Rossetti, Pharm.D., BCPS []  [place], Pharm.D., BCPS, AAHIVP []  Legrand Como, Pharm.D., BCPS, AAHIVP []  Milus Glazier, Pharm.D. []  Stephens November, Pharm.DWandalee Ferdinand Arminger, Pharm.D.  Positive urine culture, >/= 100,000 colonies -> E Coli  [x]  Patient discharged without antimicrobial prescription and treatment is now indicated []  Organism is resistant to prescribed ED discharge antimicrobial []  Patient with positive blood cultures  Changes discussed with ED provider: Clayton Bibles PA New antibiotic prescription "Keflex 500 mg po Q 12 h x 7 days" Called to   Contacted patient, date 06/04/16, time 1240  Unable to reach by mobile or home #.   Letter sent to [facility] address.    Dortha Kern 06/04/2016, 12:45 PM

## 2016-06-04 NOTE — Progress Notes (Signed)
ED Antimicrobial Stewardship Positive Culture Follow Up   Sophia [name] is an 83 y.o. female who presented to [facility] on 06/01/2016 with a chief complaint of  Chief Complaint  Patient presents with  . Flank Pain    Recent Results (from the past 720 hour(s))  Urine C&S     Status: Abnormal   Collection Time: 06/01/16  9:16 AM  Result Value Ref Range Status   Specimen Description URINE, RANDOM  Final   Special Requests NONE  Final   Culture >=100,000 COLONIES/mL ESCHERICHIA COLI (A)  Final   Report Status 06/03/2016 FINAL  Final   Organism ID, Bacteria ESCHERICHIA COLI (A)  Final      Susceptibility   Escherichia coli - MIC*    AMPICILLIN <=2 SENSITIVE Sensitive     CEFAZOLIN <=4 SENSITIVE Sensitive     CEFTRIAXONE <=1 SENSITIVE Sensitive     CIPROFLOXACIN <=0.25 SENSITIVE Sensitive     GENTAMICIN <=1 SENSITIVE Sensitive     IMIPENEM <=0.25 SENSITIVE Sensitive     NITROFURANTOIN <=16 SENSITIVE Sensitive     TRIMETH/SULFA <=20 SENSITIVE Sensitive     AMPICILLIN/SULBACTAM <=2 SENSITIVE Sensitive     PIP/TAZO <=4 SENSITIVE Sensitive     Extended ESBL NEGATIVE Sensitive     * >=100,000 COLONIES/mL ESCHERICHIA COLI    [x]  Patient discharged originally without antimicrobial agent and treatment is now indicated  New antibiotic prescription: Cephalexin 500mg  - Take 500mg  by mouth every 12 hours for 7 days   ED Provider: Clayton Bibles, PA-C  Stark Klein, PharmD Clinical Pharmacy Resident [phone] (Pager) 06/04/2016 9:52 AM

## 2016-06-08 ENCOUNTER — Encounter: Payer: Self-pay | Admitting: Family Medicine

## 2016-06-08 ENCOUNTER — Ambulatory Visit (INDEPENDENT_AMBULATORY_CARE_PROVIDER_SITE_OTHER): Payer: Medicare Other | Admitting: Family Medicine

## 2016-06-08 VITALS — BP 150/76 | HR 70 | Temp 97.1°F | Ht 62.0 in | Wt 128.0 lb

## 2016-06-08 DIAGNOSIS — R109 Unspecified abdominal pain: Secondary | ICD-10-CM

## 2016-06-08 DIAGNOSIS — N3090 Cystitis, unspecified without hematuria: Secondary | ICD-10-CM | POA: Diagnosis not present

## 2016-06-08 DIAGNOSIS — H04541 Stenosis of right lacrimal canaliculi: Secondary | ICD-10-CM | POA: Diagnosis not present

## 2016-06-08 MED ORDER — AMOXICILLIN 875 MG PO TABS: 875.0000 mg | ORAL_TABLET | Freq: Two times a day (BID) | ORAL | 0 refills | Status: AC

## 2016-06-08 NOTE — Progress Notes (Signed)
Subjective:  Patient ID: Sophia [name], female    DOB: 04-Jan-1932  Age: 83 y.o. MRN: [id number]  CC: Hospitalization Follow-up (pt here today following up after going to the ED for left side pain, they did CT and didn't find anything, she is feeling much better)   HPI Sophia [name] presents for Follow-up of her abdominal pain. She points to the mid axillary line at the costal margin on the right. There is no nausea vomiting or diarrhea. Appetite is normal. She is not short of breath. CT scan results review. Normal viscera.   History Sophia [name] has a past medical history of Chronic kidney disease; Diabetes mellitus without complication ([place]); Hyperlipidemia; and Hypertension.   She has a past surgical history that includes Cholecystectomy; Sigmoidoscopy; and Esophagogastroduodenoscopy (N/A, 04/29/2015).   Her family history includes Colon cancer in her sister; Heart disease in her brother, father, and sister.She reports that she has never smoked. She has never used smokeless tobacco. She reports that she does not drink alcohol or use drugs.    ROS Review of Systems  Constitutional: Negative for activity change, appetite change and fever.  HENT: Negative for congestion, rhinorrhea and sore throat.   Eyes: Positive for discharge and redness. Negative for visual disturbance.  Respiratory: Negative for cough and shortness of breath.   Cardiovascular: Negative for chest pain and palpitations.  Gastrointestinal: Negative for abdominal pain, diarrhea and nausea.  Genitourinary: Positive for frequency. Negative for dysuria.  Musculoskeletal: Negative for arthralgias and myalgias.    Objective:  BP (!) 150/76   Pulse 70   Temp 97.1 F (36.2 C) (Oral)   Ht _0  (1.575 m)   Wt 128 lb (58.1 kg)   BMI 23.41 kg/m   BP Readings from Last 3 Encounters:  06/08/16 (!) 150/76  06/01/16 150/88  02/04/16 (!) 190/80    Wt Readings from Last 3 Encounters:  06/08/16 128 lb (58.1 kg)   06/01/16 128 lb (58.1 kg)  02/04/16 131 lb 4 oz (59.5 kg)     Physical Exam  Constitutional: She is oriented to person, place, and time. She appears well-developed and well-nourished.  HENT:  Head: Normocephalic and atraumatic.  Eyes: EOM are normal. Pupils are equal, round, and reactive to light. Right eye exhibits discharge (on right, with infraorbital erythema. Edema).  Cardiovascular: Normal rate and regular rhythm.   No murmur heard. Pulmonary/Chest: Effort normal and breath sounds normal.  Abdominal: Soft. Bowel sounds are normal. She exhibits no mass. There is no tenderness. There is no rebound and no guarding.  Neurological: She is alert and oriented to person, place, and time.  Skin: Skin is warm and dry.  Psychiatric: She has a normal mood and affect. Her behavior is normal.    Ct Abdomen Pelvis W Contrast  Result Date: 06/01/2016 CLINICAL DATA:  Right upper quadrant abdominal pain, right lower rib pain with inspiration EXAM: CT ABDOMEN AND PELVIS WITH CONTRAST TECHNIQUE: Multidetector CT imaging of the abdomen and pelvis was performed using the standard protocol following bolus administration of intravenous contrast. CONTRAST:  53m ISOVUE-300 IOPAMIDOL (ISOVUE-300) INJECTION 61% COMPARISON:  None. FINDINGS: Lower chest: Trace right pleural effusion. Hepatobiliary: Liver is within normal limits. Status post cholecystectomy. No intrahepatic or extrahepatic ductal dilatation. Pancreas: Within normal limits. Spleen: Within normal limits. Adrenals/Urinary Tract: Adrenal glands are within normal limits. Kidneys are within normal limits.  No hydronephrosis. Bladder is within normal limits. Stomach/Bowel: Stomach is within normal limits. No evidence of bowel obstruction. Normal appendix (series 201/image 65).  Left colonic diverticulosis, without evidence of diverticulitis. Vascular/Lymphatic: No evidence of abdominal aortic aneurysm. Suspected prior GDA embolization. Atherosclerotic  calcifications of the abdominal aorta and branch vessels. No suspicious abdominopelvic lymphadenopathy. Reproductive: Uterus is within normal limits. Bilateral ovaries are within normal limits. Other: No abdominopelvic ascites. Musculoskeletal: Mild to moderate superior endplate compression fracture deformity at L1 (sagittal image 28), age indeterminate but likely chronic. Degenerative changes of the visualized thoracolumbar spine. No fracture is seen. Specifically, the visualized ribs are within normal limits. IMPRESSION: No CT findings to account for the patient's right upper quadrant abdominal pain. Status post cholecystectomy. Mild to moderate superior endplate compression fracture deformity at L1, age indeterminate but likely chronic. Trace right pleural effusion. Electronically Signed   By: Julian Hy M.D.   On: 06/01/2016 11:19   Dg Abdomen Acute W/chest  Result Date: 06/01/2016 CLINICAL DATA:  Diffuse abdominal pain. Right upper quadrant pain tonight. EXAM: DG ABDOMEN ACUTE W/ 1V CHEST COMPARISON:  Portable abdominal radiograph 04/29/2015, chest radiograph 05/04/2015 FINDINGS: Cardiomegaly is stable. Low lung volumes. Prominent interstitial markings are new from prior exam. No pleural fluid. No free intra- abdominal air. Air-filled small bowel loops in the central and left abdomen, prominent but nondilated. Small volume of colonic stool. Coils in the central upper abdomen. Cholecystectomy clips in the right upper quadrant. There are pelvic phleboliths. Scattered vascular calcifications. Scoliotic curvature in degenerative change in the spine. IMPRESSION: 1. Nonobstructive bowel gas pattern. Air within nondilated bowel loops in the central and lower abdomen are nonspecific, may be normal or represent ileus. 2. Low lung volumes. Increased interstitial markings, difficult to exclude pulmonary edema. Cardiomegaly is grossly stable. Electronically Signed   By: Jeb Levering M.D.   On: 06/01/2016  06:40    Assessment & Plan:   Sophia [name] was seen today for hospitalization follow-up.  Diagnoses and all orders for this visit:  Abdominal wall pain in right flank  Lacrimal canalicular stenosis, right -     Ambulatory referral to Ophthalmology  Cystitis  Other orders -     amoxicillin (AMOXIL) 875 MG tablet; Take 1 tablet (875 mg total) by mouth 2 (two) times daily.   Urinalysis from the ER showed culture positive for Escherichia coli.   I have discontinued Ms. Rubens's oxyCODONE-acetaminophen. I am also having her start on amoxicillin. Additionally, I am having her maintain her Calcium-Magnesium, simvastatin, omeprazole, levothyroxine, blood glucose meter kit and supplies, and carvedilol.  Allergies as of 06/08/2016   No Known Allergies     Medication List       Accurate as of 06/08/16 11:05 PM. Always use your most recent med list.          amoxicillin 875 MG tablet Commonly known as:  AMOXIL Take 1 tablet (875 mg total) by mouth 2 (two) times daily.   blood glucose meter kit and supplies Kit Use up to 4x daily prn   Calcium-Magnesium 500-250 MG Tabs Take 1 tablet by mouth daily with supper.   carvedilol 6.25 MG tablet Commonly known as:  COREG TAKE ONE TABLET BY MOUTH TWICE DAILY WITH MEALS FOR BLOOD PRESSURE/HEART/KIDNEY   levothyroxine 75 MCG tablet Commonly known as:  SYNTHROID, LEVOTHROID Take 1 each morning on an empty stomach one hour before breakfast.   omeprazole 40 MG capsule Commonly known as:  PRILOSEC Take on an empty stomach one hour before breakfast and another at bedtime, two hours after your last food or drink (except Water)   simvastatin 40 MG tablet Commonly known as:  ZOCOR Take 0.5 tablets (20 mg total) by mouth daily at 6 PM. At suppertime        Follow-up: Return in about 3 months (around 09/06/2016), or if symptoms worsen or fail to improve.  Claretta Fraise, M.D.

## 2016-06-09 ENCOUNTER — Other Ambulatory Visit: Payer: Self-pay | Admitting: Family Medicine

## 2016-07-08 DIAGNOSIS — E119 Type 2 diabetes mellitus without complications: Secondary | ICD-10-CM | POA: Diagnosis not present

## 2016-07-08 DIAGNOSIS — H2513 Age-related nuclear cataract, bilateral: Secondary | ICD-10-CM | POA: Diagnosis not present

## 2016-07-08 DIAGNOSIS — H2511 Age-related nuclear cataract, right eye: Secondary | ICD-10-CM | POA: Diagnosis not present

## 2016-07-08 DIAGNOSIS — H04551 Acquired stenosis of right nasolacrimal duct: Secondary | ICD-10-CM | POA: Diagnosis not present

## 2016-07-08 LAB — HM DIABETES EYE EXAM

## 2016-07-31 IMAGING — XA IR ANGIO/ADD [PERSON_NAME]
8 series · 13 of 24 positions shown · IV contrast (IODINE)
Comparison: none

CLINICAL DATA: Large volume upper GI bleed. Endoscopy revealed
probable duodenal ulcer has active source, incompletely controlled.

[Series 1: body 4 care · 2 of 7 slices shown (1 of 3)]
[im 1/7]
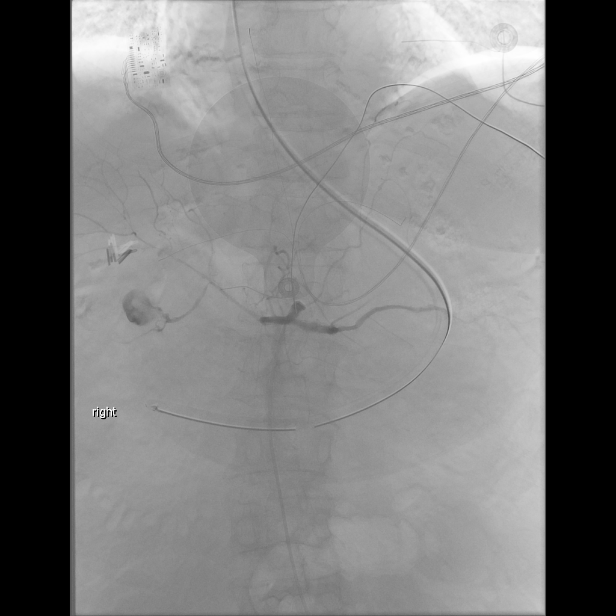
[im 5/7]
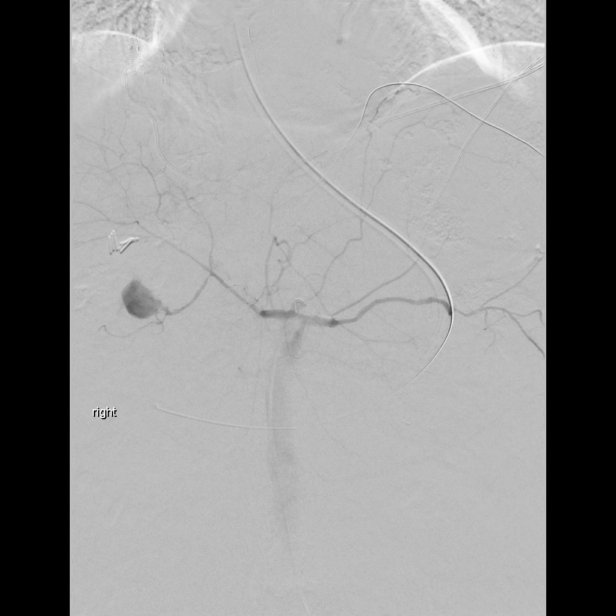

[Series 2: body 4 care · 1 of 5 slices shown (2 of 3)]
[im 1/5]
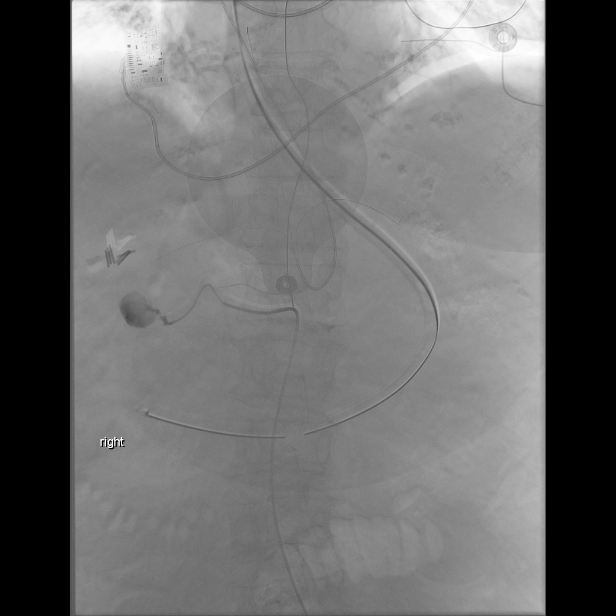

[Series 3: body 4 care · 2 of 8 slices shown (3 of 3)]
[im 1/8]
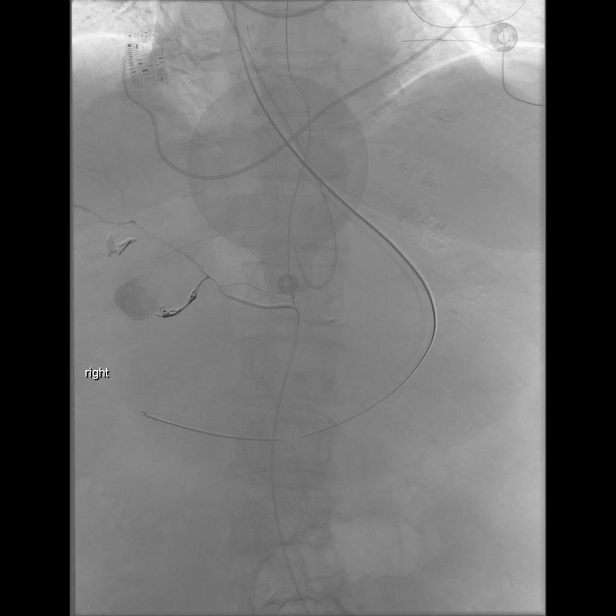
[im 5/8]
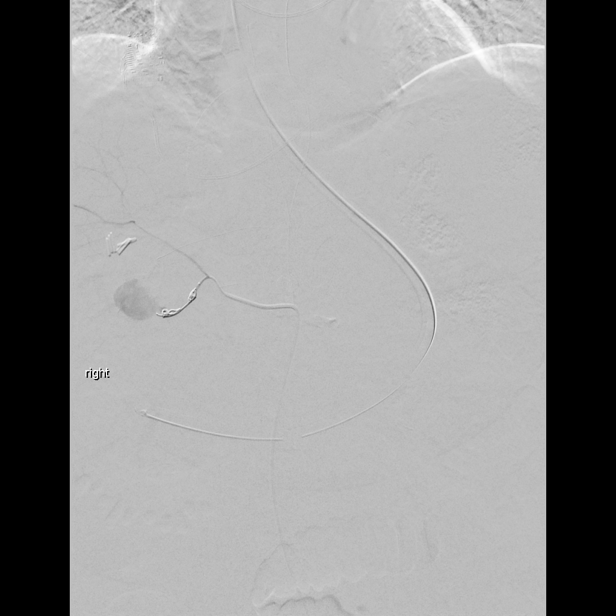

[Series 4: body 2 · 1 of 4 slices shown (1 of 5)]
[im 1/4]
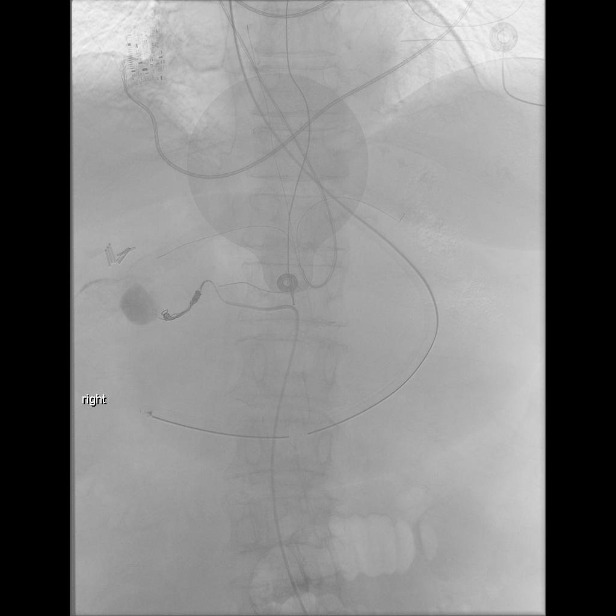

[Series 5: body 2 · 3 of 8 slices shown (2 of 5)]
[im 1/8]
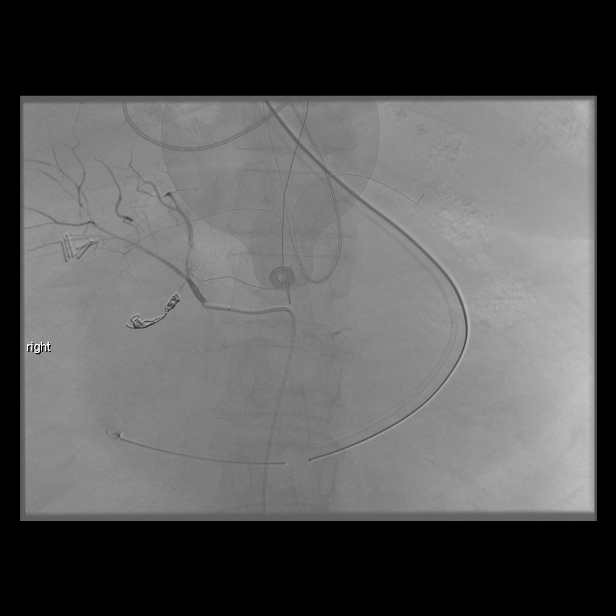
[im 3/8]
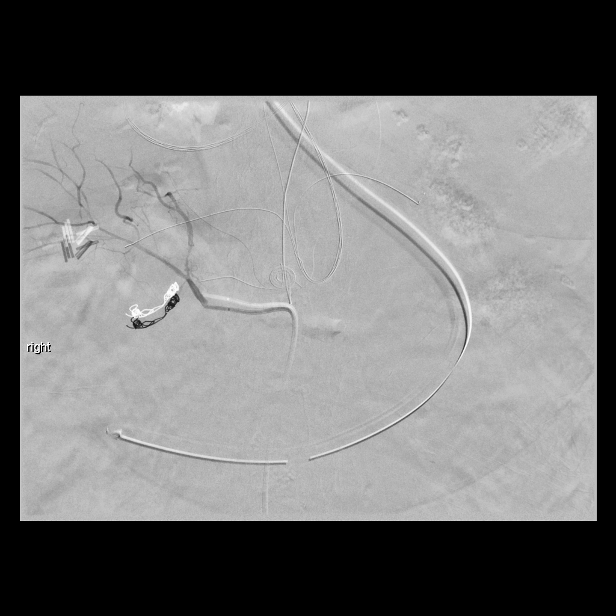
[im 8/8]
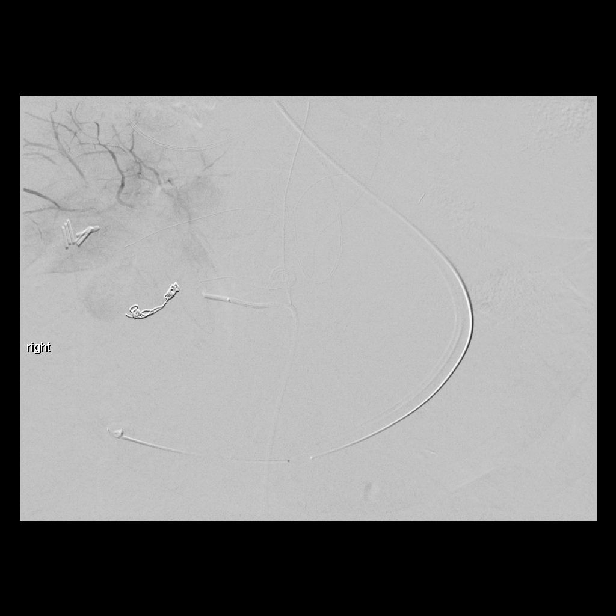

[Series 6: body 2 · 1 of 6 slices shown (3 of 5)]
[im 3/6]
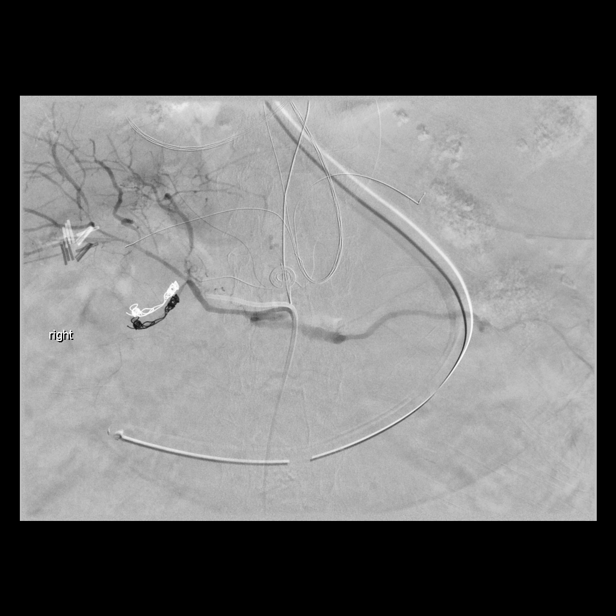

[Series 8: body 2 · 2 of 7 slices shown (4 of 5)]
[im 1/7]
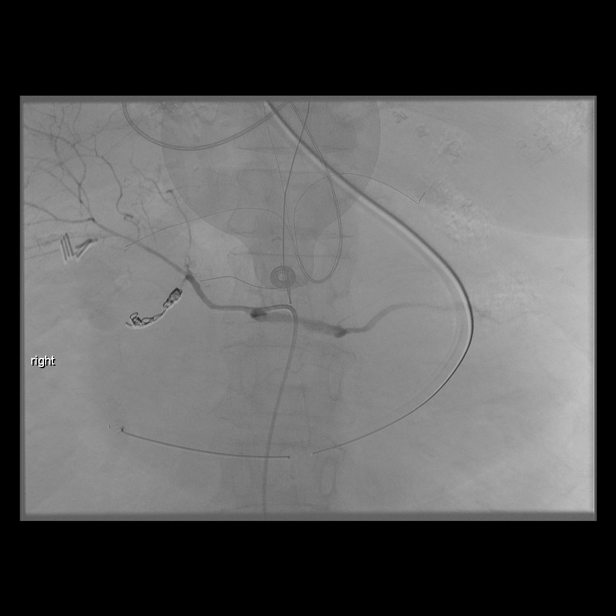
[im 7/7]
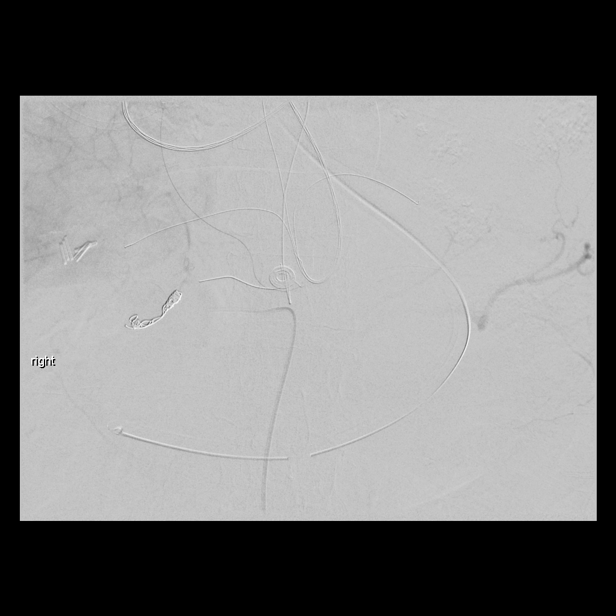

[Series 9: body 2 · 1 of 4 slices shown (5 of 5)]
[im 4/4]
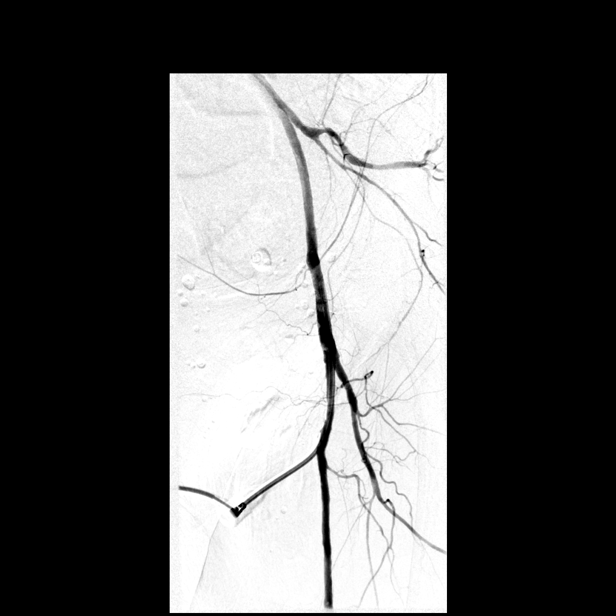

[13 of 24 positions shown; findings below may reference images not displayed]

EXAM:
IR GISELLE SIEBERT HEMORR LYMPH EXTRAV INC GUIDE ROADMAPPING; IR
ULTRASOUND GUIDANCE VASC ACCESS LEFT; ARTERIOGRAPHY; SELECTIVE
VISCERAL ARTERIOGRAPHY; ADDITIONAL ARTERIOGRAPHY

ANESTHESIA/SEDATION:
Intravenous Fentanyl and Versed had been administered for previous
procedure. No additional medications were administered. Radiology RN
provided continuous cardiorespiratory monitoring.

MEDICATIONS:
Lidocaine 1% subcutaneous 5 mL

CONTRAST:  50mL OMNIPAQUE IOHEXOL 300 MG/ML  SOLN

PROCEDURE:
The procedure, risks (including but not limited to bleeding,
infection, organ damage ), benefits, and alternatives were explained
to the spouse and daughter. Questions regarding the procedure were
encouraged and answered. The family understands and consents to the
procedure. Because of a indwelling right femoral venous catheter, a
left-sided femoral approach was selected. Site was marked, prepped
with Betadine, draped in usual sterile fashion, infiltrated locally
with 1% lidocaine. Under real-time ultrasound guidance, the left
femoral artery was accessed with a 21-gauge micropuncture needle,
exchanged over a 018 guidewire for a transitional dilator, through
which a 035 guidewire was advanced. A 5 French vascular sheath was
placed. Through this a 5 French C2 catheter was coaxial advanced in
used to selectively catheterize the celiac axis for selective
arteriography. An angled Glidewire was advanced into the
gastroduodenal artery but the C2 catheter could not be adequately
advanced distally. A coaxial renegade micro catheter with transient
guidewire was then advanced through the C2 and used to selectively
catheterize the gastroduodenal artery. Coil embolization of the GDA
performed with 3-5 mm coils the cessation of flow. After final
arteriography through the C2 catheter, the catheter and sheath were
removed and hemostasis achieved with the aid of the Exoseal device
after confirmatory femoral arteriography. The patient tolerated the
procedure well.

COMPLICATIONS:
None immediate
FINDINGS: Brisk bleeding from a distal branch of the gastroduodenal artery was
evident on the initial arteriogram, into what is probably the lumen
of the duodenum. The vessel was selectively catheterized and coil
embolization performed until cessation of flow through the treated
segment and no further extravasation evident.

There is elsewhere diffuse arterial vasospasm. No other pathologic
regions identified.
IMPRESSION: 1. Brisk active arterial extravasation from a branch of the
gastroduodenal artery probably into the duodenal lumen.
2. Technically successful coil embolization of the gastroduodenal
artery with cessation of extravasation.

## 2016-07-31 IMAGING — CR DG ABD PORTABLE 1V
1 series · 1 of 1 positions shown · non-contrast
Comparison: None.

CLINICAL DATA: 83-year-old female with bloody stool and vomiting
blood

EXAM:
PORTABLE ABDOMEN - 1 VIEW

[AP]
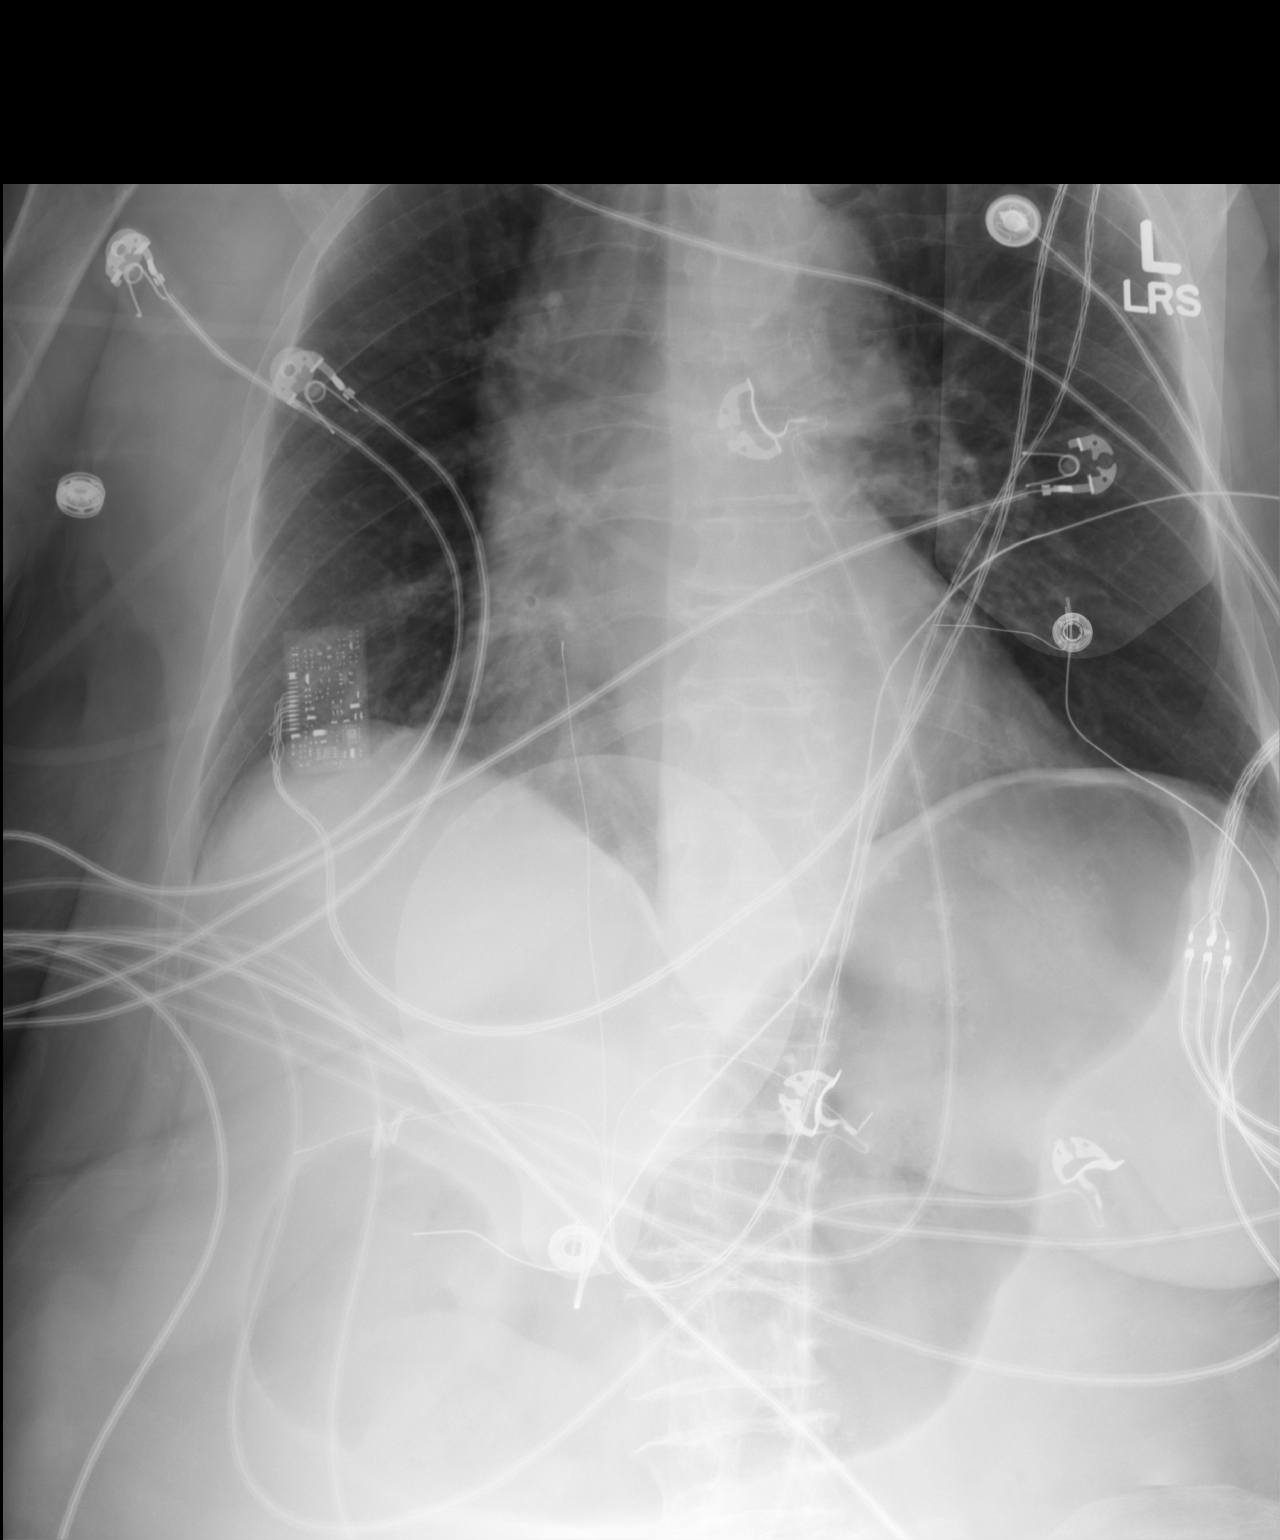

[1 of 1 positions shown; findings below may reference images not displayed]

FINDINGS: Single upright radiograph demonstrates gas within the stomach. No
free air identified. External defibrillator pads project over the
left chest and right upper quadrant. The heart is likely mildly
enlarged. The lungs appear clear. No acute osseous abnormality.
IMPRESSION: 1. No free air visualized.
2. Mild cardiomegaly.
3. The lungs appear clear.

## 2016-08-02 IMAGING — CR DG CHEST 1V PORT
1 series · 1 of 1 positions shown · non-contrast
Comparison: 05/01/2015

CLINICAL DATA: Respiratory failure

EXAM:
PORTABLE CHEST 1 VIEW

[AP]
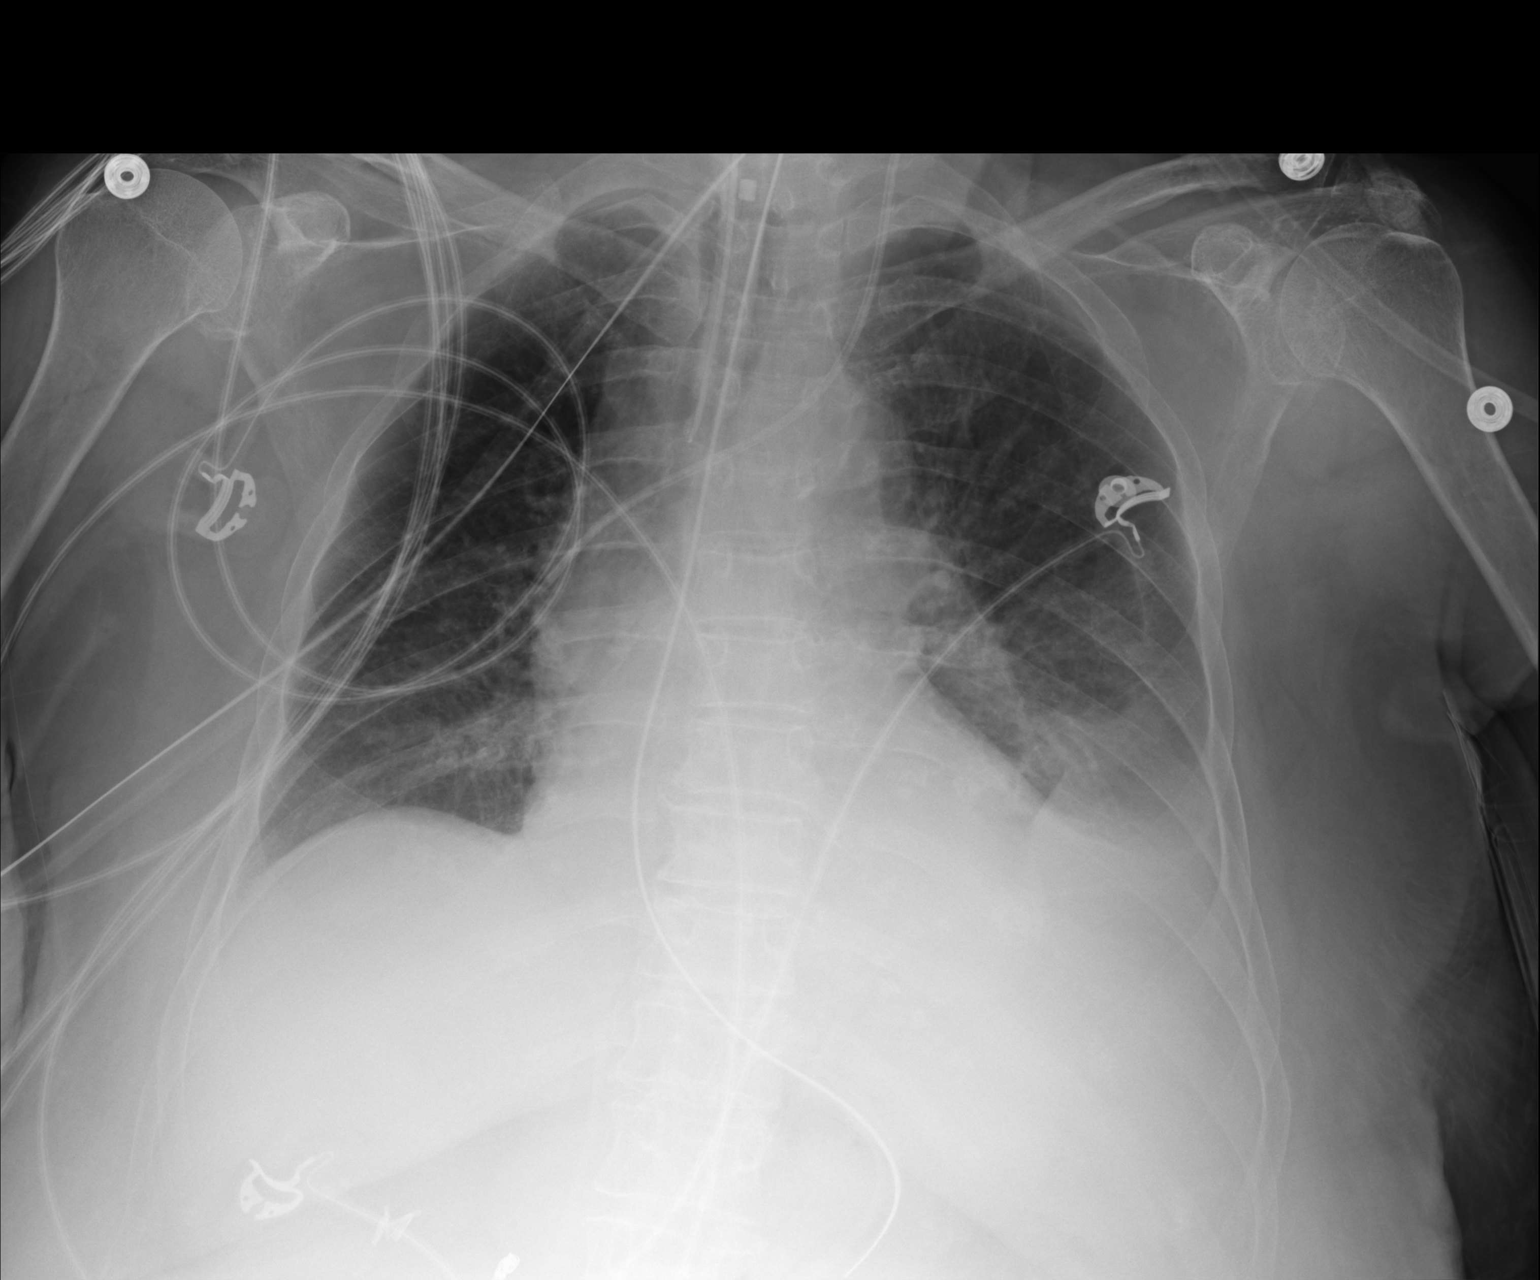

[1 of 1 positions shown; findings below may reference images not displayed]

FINDINGS: Stable endotracheal and NG tube position. There is left IJ central
line with tip in SVC. No pneumothorax. Persistent small left pleural
effusion and left basilar atelectasis or infiltrate. No pulmonary
edema.
IMPRESSION: Stable support apparatus. Left IJ central line in place. Persistent
small left pleural effusion with left basilar atelectasis or
infiltrate.

## 2016-08-02 IMAGING — CR DG CHEST 1V PORT
1 series · 1 of 1 positions shown · non-contrast
Comparison: 04/29/2015

CLINICAL DATA: Endotracheal tube placement

EXAM:
PORTABLE CHEST 1 VIEW

[AP]
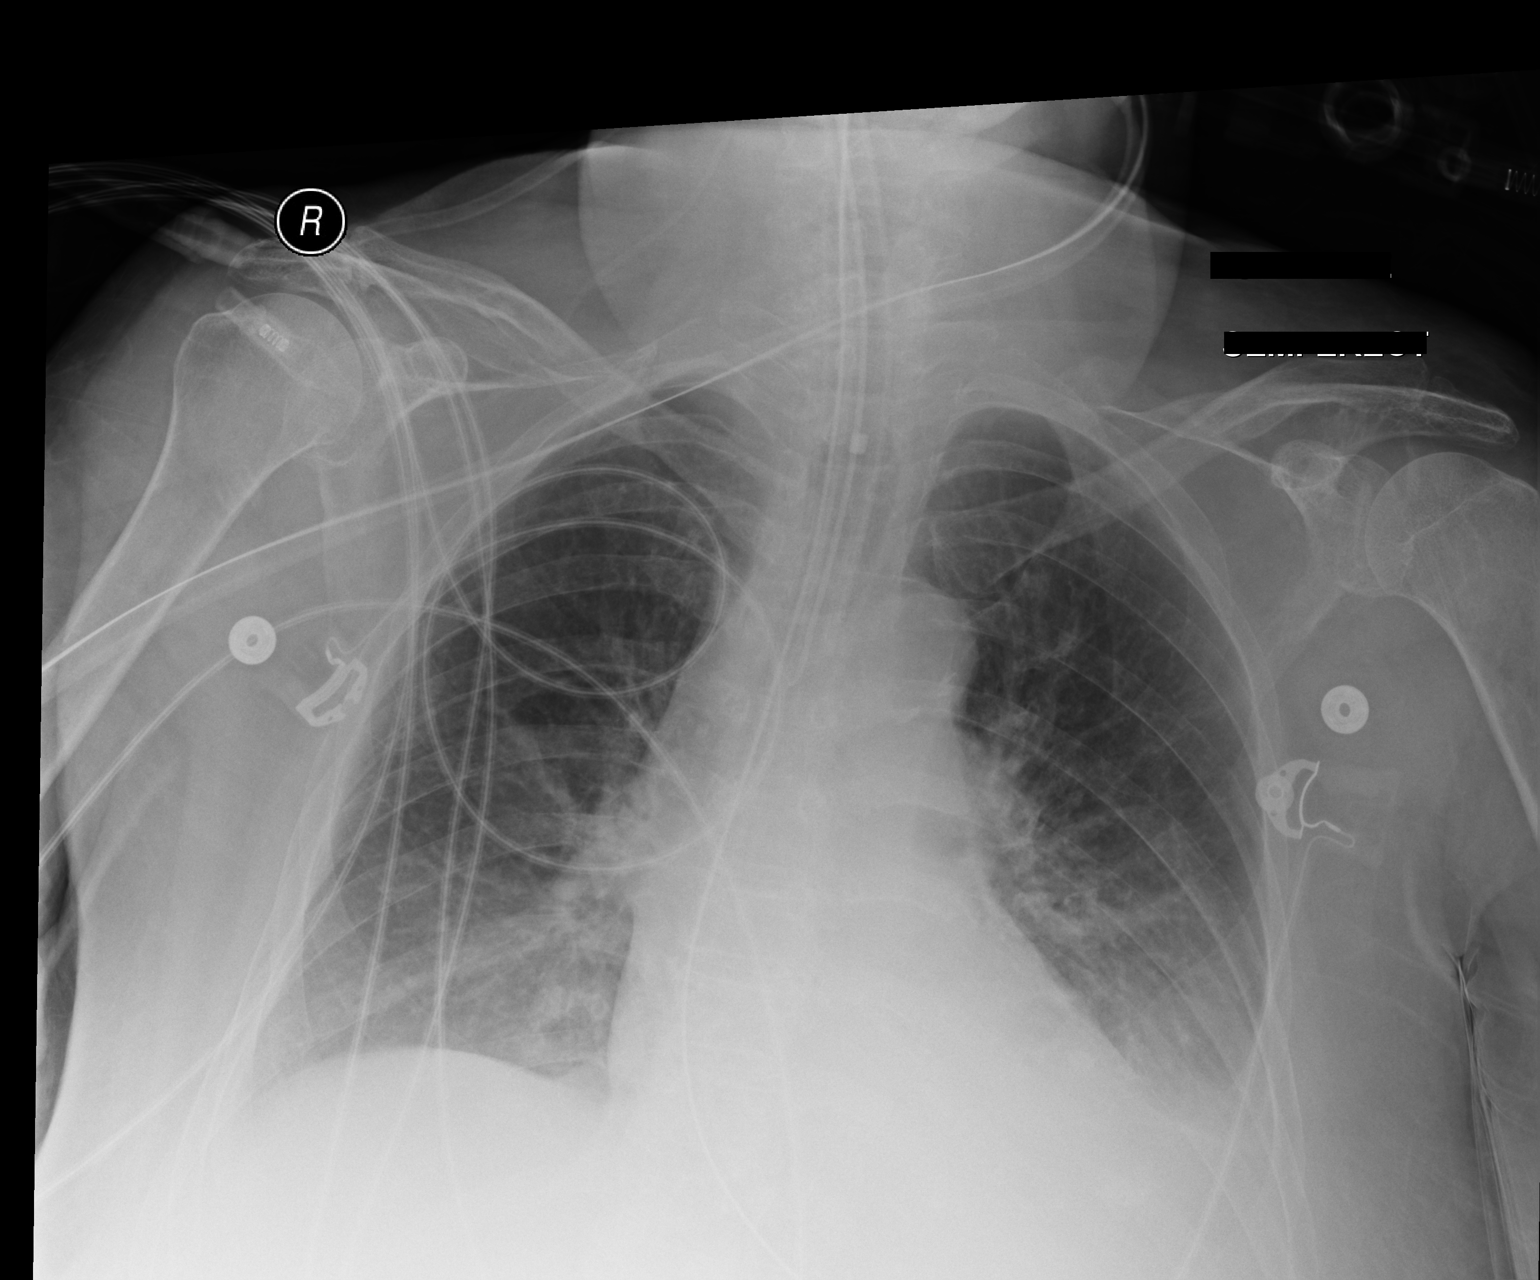

[1 of 1 positions shown; findings below may reference images not displayed]

FINDINGS: Grossly unchanged enlarged cardiac silhouette and mediastinal
contours with atherosclerotic plaque within the thoracic aorta.
Endotracheal tube overlies tracheal air column with tip
approximately 1.7 cm above the carina. Enteric tube tip and side
port project below the left hemidiaphragm. Worsening bibasilar
heterogeneous/consolidative opacities, left greater than right. No
definite pleural effusion. No pneumothorax. No definite evidence of
edema. Unchanged bones.
IMPRESSION: 1. Appropriately positioned support apparatus as above. No
pneumothorax.
2. Worsening bibasilar heterogeneous opacities, left greater than
right, atelectasis versus infiltrate.
3. No evidence of edema.

## 2016-08-03 ENCOUNTER — Other Ambulatory Visit: Payer: Self-pay | Admitting: *Deleted

## 2016-08-03 MED ORDER — OMEPRAZOLE 40 MG PO CPDR: DELAYED_RELEASE_CAPSULE | ORAL | 5 refills | Status: DC

## 2016-09-02 DIAGNOSIS — H2511 Age-related nuclear cataract, right eye: Secondary | ICD-10-CM | POA: Diagnosis not present

## 2016-09-02 DIAGNOSIS — H353132 Nonexudative age-related macular degeneration, bilateral, intermediate dry stage: Secondary | ICD-10-CM | POA: Diagnosis not present

## 2016-09-02 DIAGNOSIS — H2512 Age-related nuclear cataract, left eye: Secondary | ICD-10-CM | POA: Diagnosis not present

## 2016-09-03 ENCOUNTER — Ambulatory Visit: Payer: Medicare Other | Admitting: Family Medicine

## 2016-09-07 ENCOUNTER — Ambulatory Visit (INDEPENDENT_AMBULATORY_CARE_PROVIDER_SITE_OTHER): Payer: Medicare Other | Admitting: Family Medicine

## 2016-09-07 ENCOUNTER — Encounter: Payer: Self-pay | Admitting: Family Medicine

## 2016-09-07 VITALS — BP 174/84 | HR 67 | Ht 62.0 in | Wt 132.0 lb

## 2016-09-07 DIAGNOSIS — N182 Chronic kidney disease, stage 2 (mild): Secondary | ICD-10-CM

## 2016-09-07 DIAGNOSIS — E559 Vitamin D deficiency, unspecified: Secondary | ICD-10-CM | POA: Diagnosis not present

## 2016-09-07 DIAGNOSIS — I10 Essential (primary) hypertension: Secondary | ICD-10-CM

## 2016-09-07 DIAGNOSIS — E782 Mixed hyperlipidemia: Secondary | ICD-10-CM

## 2016-09-07 DIAGNOSIS — E039 Hypothyroidism, unspecified: Secondary | ICD-10-CM

## 2016-09-07 DIAGNOSIS — E119 Type 2 diabetes mellitus without complications: Secondary | ICD-10-CM

## 2016-09-07 LAB — URINALYSIS
Bilirubin, UA: NEGATIVE
Glucose, UA: NEGATIVE
Ketones, UA: NEGATIVE
Leukocytes, UA: NEGATIVE
Nitrite, UA: NEGATIVE
Protein, UA: NEGATIVE
RBC, UA: NEGATIVE
Specific Gravity, UA: 1.015 (ref 1.005–1.030)
Urobilinogen, Ur: 0.2 mg/dL (ref 0.2–1.0)
pH, UA: 7 (ref 5.0–7.5)

## 2016-09-07 LAB — BAYER DCA HB A1C WAIVED: HB A1C (BAYER DCA - WAIVED): 6.3 % (ref <7.0)

## 2016-09-07 MED ORDER — OMEPRAZOLE 40 MG PO CPDR: DELAYED_RELEASE_CAPSULE | ORAL | 0 refills | Status: DC

## 2016-09-07 MED ORDER — LEVOTHYROXINE SODIUM 75 MCG PO TABS: ORAL_TABLET | ORAL | 0 refills | Status: DC

## 2016-09-07 MED ORDER — CARVEDILOL 6.25 MG PO TABS: ORAL_TABLET | ORAL | 0 refills | Status: DC

## 2016-09-07 MED ORDER — SIMVASTATIN 40 MG PO TABS: 20.0000 mg | ORAL_TABLET | Freq: Every day | ORAL | 0 refills | Status: DC

## 2016-09-07 NOTE — Patient Instructions (Signed)

## 2016-09-07 NOTE — Progress Notes (Signed)
Subjective:  Patient ID: Sophia [name], female    DOB: 04/06/1932  Age: 83 y.o. MRN: [id number]  CC: Diabetes (pt here today for routine follow up on her chronic medical conditions, no other concerns voiced.)   HPI Daana Petrasek presents for  follow-up of hypertension. Patient has no history of headache chest pain or shortness of breath or recent cough. Patient also denies symptoms of TIA such as numbness weakness lateralizing. Patient not checking blood pressure at home.Patient denies side effects from medication. States taking it regularly.  Patient also  in for follow-up of elevated cholesterol. Doing well without complaints on current medication. Denies side effects of statin including myalgia and arthralgia and nausea. Also in today for liver function testing. Currently no chest pain, shortness of breath or other cardiovascular related symptoms noted.  Follow-up of diabetes. Patient does not check blood sugar at home. Ophthalmologist told her she didn't have diabetes. Patient denies symptoms such as polyuria, polydipsia, excessive hunger, nausea No significant hypoglycemic spells noted. Medications reviewed. Not taking DM med.   History Keirra has a past medical history of Chronic kidney disease; Diabetes mellitus without complication ([place]); Hyperlipidemia; and Hypertension.   She has a past surgical history that includes Cholecystectomy; Sigmoidoscopy; and Esophagogastroduodenoscopy (N/A, 04/29/2015).   Her family history includes Colon cancer in her sister; Heart disease in her brother, father, and sister.She reports that she has never smoked. She has never used smokeless tobacco. She reports that she does not drink alcohol or use drugs.  Current Outpatient Prescriptions on File Prior to Visit  Medication Sig Dispense Refill  . blood glucose meter kit and supplies KIT Use up to 4x daily prn 1 each 0  . Calcium-Magnesium 500-250 MG TABS Take 1 tablet by mouth daily with  supper. 30 each 5  . carvedilol (COREG) 6.25 MG tablet TAKE ONE TABLET BY MOUTH TWICE DAILY WITH MEALS FOR BLOOD PRESSURE/HEART/KIDNEY 60 tablet 1  . levothyroxine (SYNTHROID, LEVOTHROID) 75 MCG tablet Take 1 each morning on an empty stomach one hour before breakfast. (Patient taking differently: Take 75 mcg by mouth daily before breakfast. ) 30 tablet 5  . omeprazole (PRILOSEC) 40 MG capsule Take on an empty stomach one hour before breakfast and another at bedtime, two hours after your last food or drink (except Water) 60 capsule 5  . simvastatin (ZOCOR) 40 MG tablet Take 0.5 tablets (20 mg total) by mouth daily at 6 PM. At suppertime 90 tablet 1   No current facility-administered medications on file prior to visit.     ROS Review of Systems  Constitutional: Negative for activity change, appetite change and fever.  HENT: Negative for congestion, rhinorrhea and sore throat.   Eyes: Negative for visual disturbance.  Respiratory: Negative for cough and shortness of breath.   Cardiovascular: Negative for chest pain and palpitations.  Gastrointestinal: Negative for abdominal pain, diarrhea and nausea.  Genitourinary: Negative for dysuria.  Musculoskeletal: Negative for arthralgias and myalgias.    Objective:  BP (!) 195/81   Pulse 67   Ht '5\' 2"'  (1.575 m)   Wt 132 lb (59.9 kg)   BMI 24.14 kg/m   BP Readings from Last 3 Encounters:  09/07/16 (!) 195/81  06/08/16 (!) 150/76  06/01/16 150/88    Wt Readings from Last 3 Encounters:  09/07/16 132 lb (59.9 kg)  06/08/16 128 lb (58.1 kg)  06/01/16 128 lb (58.1 kg)     Physical Exam  Constitutional: She is oriented to person, place, and time. She  appears well-developed and well-nourished. No distress.  HENT:  Head: Normocephalic and atraumatic.  Right Ear: External ear normal.  Left Ear: External ear normal.  Nose: Nose normal.  Mouth/Throat: Oropharynx is clear and moist.  Eyes: Conjunctivae and EOM are normal. Pupils are equal,  round, and reactive to light.  Neck: Normal range of motion. Neck supple. No thyromegaly present.  Cardiovascular: Normal rate, regular rhythm and normal heart sounds.   No murmur heard. Pulmonary/Chest: Effort normal and breath sounds normal. No respiratory distress. She has no wheezes. She has no rales.  Abdominal: Soft. Bowel sounds are normal. She exhibits no distension. There is no tenderness.  Lymphadenopathy:    She has no cervical adenopathy.  Neurological: She is alert and oriented to person, place, and time. She has normal reflexes.  Skin: Skin is warm and dry.  Psychiatric: She has a normal mood and affect. Her behavior is normal. Judgment and thought content normal.    Diabetic Foot Exam - Simple   Simple Foot Form Visual Inspection No deformities, no ulcerations, no other skin breakdown bilaterally:  Yes Sensation Testing Intact to touch and monofilament testing bilaterally:  Yes Pulse Check Posterior Tibialis and Dorsalis pulse intact bilaterally:  Yes Comments        Assessment & Plan:   Scarlet was seen today for diabetes.  Diagnoses and all orders for this visit:  Diabetes mellitus without complication ([place]) -     Bayer DCA Hb A1c Waived -     Urinalysis -     CMP14+EGFR -     Ambulatory referral to Ophthalmology -     CBC with Differential/Platelet -     Lipid panel -     TSH + free T4 -     Microalbumin / creatinine urine ratio  Essential hypertension -     CMP14+EGFR  Mixed hyperlipidemia -     CMP14+EGFR -     Lipid panel  Vitamin D deficiency -     CMP14+EGFR  Stage 2 chronic kidney disease -     Urinalysis -     CMP14+EGFR -     Microalbumin / creatinine urine ratio  Hypothyroidism, unspecified type -     CMP14+EGFR -     TSH + free T4   I am having Ms. Bosak maintain her Calcium-Magnesium, simvastatin, levothyroxine, blood glucose meter kit and supplies, carvedilol, and omeprazole.  No orders of the defined types were  placed in this encounter.  I will add metformin if her A1c is above 6.5 today. Also she was cautioned to take her levothyroxine on an empty stomach with no other medications. She is also aware to take the omeprazole on an empty stomach. Diabetic foot care reviewed with patient.  Follow-up: Return in about 3 months (around 12/07/2016).  Claretta Fraise, M.D.

## 2016-09-07 NOTE — Addendum Note (Signed)
Addended by: Marylin Crosby on: 09/07/2016 11:51 AM   Modules accepted: Orders

## 2016-09-08 LAB — CBC WITH DIFFERENTIAL/PLATELET
Basophils Absolute: 0.1 10*3/uL (ref 0.0–0.2)
Basos: 1 %
EOS (ABSOLUTE): 0.3 10*3/uL (ref 0.0–0.4)
Eos: 5 %
Hematocrit: 38.2 % (ref 34.0–46.6)
Hemoglobin: 12.6 g/dL (ref 11.1–15.9)
Immature Grans (Abs): 0 10*3/uL (ref 0.0–0.1)
Immature Granulocytes: 0 %
Lymphocytes Absolute: 1.8 10*3/uL (ref 0.7–3.1)
Lymphs: 32 %
MCH: 30.8 pg (ref 26.6–33.0)
MCHC: 33 g/dL (ref 31.5–35.7)
MCV: 93 fL (ref 79–97)
Monocytes Absolute: 0.5 10*3/uL (ref 0.1–0.9)
Monocytes: 9 %
Neutrophils Absolute: 3.1 10*3/uL (ref 1.4–7.0)
Neutrophils: 53 %
Platelets: 297 10*3/uL (ref 150–379)
RBC: 4.09 x10E6/uL (ref 3.77–5.28)
RDW: 14.9 % (ref 12.3–15.4)
WBC: 5.7 10*3/uL (ref 3.4–10.8)

## 2016-09-08 LAB — LIPID PANEL
Chol/HDL Ratio: 2.8 ratio (ref 0.0–4.4)
Cholesterol, Total: 186 mg/dL (ref 100–199)
HDL: 67 mg/dL (ref >39)
LDL Calculated: 91 mg/dL (ref 0–99)
Triglycerides: 139 mg/dL (ref 0–149)
VLDL Cholesterol Cal: 28 mg/dL (ref 5–40)

## 2016-09-08 LAB — CMP14+EGFR
ALT: 21 IU/L (ref 0–32)
AST: 30 IU/L (ref 0–40)
Albumin/Globulin Ratio: 1.7 (ref 1.2–2.2)
Albumin: 4.4 g/dL (ref 3.5–4.7)
Alkaline Phosphatase: 79 IU/L (ref 39–117)
BUN/Creatinine Ratio: 20 (ref 12–28)
BUN: 24 mg/dL (ref 8–27)
Bilirubin Total: 0.6 mg/dL (ref 0.0–1.2)
CO2: 26 mmol/L (ref 18–29)
Calcium: 9.6 mg/dL (ref 8.7–10.3)
Chloride: 101 mmol/L (ref 96–106)
Creatinine, Ser: 1.2 mg/dL — ABNORMAL HIGH (ref 0.57–1.00)
GFR calc Af Amer: 49 mL/min/{1.73_m2} — ABNORMAL LOW (ref >59)
GFR calc non Af Amer: 42 mL/min/{1.73_m2} — ABNORMAL LOW (ref >59)
Globulin, Total: 2.6 g/dL (ref 1.5–4.5)
Glucose: 104 mg/dL — ABNORMAL HIGH (ref 65–99)
Potassium: 4.3 mmol/L (ref 3.5–5.2)
Sodium: 142 mmol/L (ref 134–144)
Total Protein: 7 g/dL (ref 6.0–8.5)

## 2016-09-08 LAB — TSH+FREE T4
Free T4: 1.91 ng/dL — ABNORMAL HIGH (ref 0.82–1.77)
TSH: 1.28 u[IU]/mL (ref 0.450–4.500)

## 2016-09-08 LAB — MICROALBUMIN / CREATININE URINE RATIO
Creatinine, Urine: 81.5 mg/dL
Microalb/Creat Ratio: 32.8 mg/g creat — ABNORMAL HIGH (ref 0.0–30.0)
Microalbumin, Urine: 26.7 ug/mL

## 2016-09-16 DIAGNOSIS — H25811 Combined forms of age-related cataract, right eye: Secondary | ICD-10-CM | POA: Diagnosis not present

## 2016-09-16 DIAGNOSIS — H2511 Age-related nuclear cataract, right eye: Secondary | ICD-10-CM | POA: Diagnosis not present

## 2016-10-05 DIAGNOSIS — H2512 Age-related nuclear cataract, left eye: Secondary | ICD-10-CM | POA: Diagnosis not present

## 2016-10-07 DIAGNOSIS — H25812 Combined forms of age-related cataract, left eye: Secondary | ICD-10-CM | POA: Diagnosis not present

## 2016-10-07 DIAGNOSIS — H2512 Age-related nuclear cataract, left eye: Secondary | ICD-10-CM | POA: Diagnosis not present

## 2016-10-26 ENCOUNTER — Other Ambulatory Visit: Payer: Self-pay | Admitting: Family Medicine

## 2016-12-03 DIAGNOSIS — H578 Other specified disorders of eye and adnexa: Secondary | ICD-10-CM | POA: Diagnosis not present

## 2016-12-06 ENCOUNTER — Other Ambulatory Visit: Payer: Self-pay | Admitting: Family Medicine

## 2016-12-13 ENCOUNTER — Other Ambulatory Visit: Payer: Self-pay | Admitting: Family Medicine

## 2016-12-29 DIAGNOSIS — H578 Other specified disorders of eye and adnexa: Secondary | ICD-10-CM | POA: Diagnosis not present

## 2017-01-26 ENCOUNTER — Other Ambulatory Visit: Payer: Self-pay | Admitting: Family Medicine

## 2017-02-17 DIAGNOSIS — Z23 Encounter for immunization: Secondary | ICD-10-CM | POA: Diagnosis not present

## 2017-03-04 ENCOUNTER — Other Ambulatory Visit: Payer: Self-pay | Admitting: Family Medicine

## 2017-03-24 ENCOUNTER — Other Ambulatory Visit: Payer: Self-pay | Admitting: Family Medicine

## 2017-04-22 ENCOUNTER — Other Ambulatory Visit: Payer: Self-pay | Admitting: Family Medicine

## 2017-04-22 NOTE — Telephone Encounter (Signed)
Next OV 04/26/17

## 2017-04-26 ENCOUNTER — Ambulatory Visit: Payer: Medicare Other | Admitting: Family Medicine

## 2017-05-03 ENCOUNTER — Ambulatory Visit (INDEPENDENT_AMBULATORY_CARE_PROVIDER_SITE_OTHER): Payer: Medicare Other

## 2017-05-03 ENCOUNTER — Encounter: Payer: Self-pay | Admitting: Family Medicine

## 2017-05-03 ENCOUNTER — Ambulatory Visit (INDEPENDENT_AMBULATORY_CARE_PROVIDER_SITE_OTHER): Payer: Medicare Other | Admitting: Family Medicine

## 2017-05-03 VITALS — BP 177/77 | HR 60 | Temp 97.4°F | Ht 62.0 in | Wt 138.0 lb

## 2017-05-03 DIAGNOSIS — E119 Type 2 diabetes mellitus without complications: Secondary | ICD-10-CM

## 2017-05-03 DIAGNOSIS — M810 Age-related osteoporosis without current pathological fracture: Secondary | ICD-10-CM

## 2017-05-03 DIAGNOSIS — E782 Mixed hyperlipidemia: Secondary | ICD-10-CM

## 2017-05-03 DIAGNOSIS — E039 Hypothyroidism, unspecified: Secondary | ICD-10-CM | POA: Diagnosis not present

## 2017-05-03 DIAGNOSIS — E559 Vitamin D deficiency, unspecified: Secondary | ICD-10-CM | POA: Diagnosis not present

## 2017-05-03 DIAGNOSIS — N3945 Continuous leakage: Secondary | ICD-10-CM

## 2017-05-03 DIAGNOSIS — I10 Essential (primary) hypertension: Secondary | ICD-10-CM | POA: Diagnosis not present

## 2017-05-03 LAB — URINALYSIS
Bilirubin, UA: NEGATIVE
Glucose, UA: NEGATIVE
Ketones, UA: NEGATIVE
Nitrite, UA: NEGATIVE
Protein, UA: NEGATIVE
RBC, UA: NEGATIVE
Specific Gravity, UA: 1.015 (ref 1.005–1.030)
Urobilinogen, Ur: 0.2 mg/dL (ref 0.2–1.0)
pH, UA: 6.5 (ref 5.0–7.5)

## 2017-05-03 LAB — BAYER DCA HB A1C WAIVED: HB A1C (BAYER DCA - WAIVED): 6.2 % (ref <7.0)

## 2017-05-03 MED ORDER — ALENDRONATE SODIUM 70 MG PO TABS: 70.0000 mg | ORAL_TABLET | ORAL | 11 refills | Status: DC

## 2017-05-03 MED ORDER — OMEPRAZOLE 40 MG PO CPDR: DELAYED_RELEASE_CAPSULE | ORAL | 1 refills | Status: DC

## 2017-05-03 MED ORDER — LEVOTHYROXINE SODIUM 75 MCG PO TABS: ORAL_TABLET | ORAL | 1 refills | Status: DC

## 2017-05-03 MED ORDER — CARVEDILOL 6.25 MG PO TABS: ORAL_TABLET | ORAL | 1 refills | Status: DC

## 2017-05-03 MED ORDER — SIMVASTATIN 40 MG PO TABS: ORAL_TABLET | ORAL | 1 refills | Status: DC

## 2017-05-03 NOTE — Progress Notes (Signed)
Subjective:  Patient ID: Sophia [name],  female    DOB: December 09, 1931  Age: 83 y.o.    CC: Diabetes (pt here today for routine follow up of her chronic medical conditions and c/o urinary urgency.)   HPI [name] presents for  follow-up of hypertension. Patient has no history of headache chest pain or shortness of breath or recent cough. Patient also denies symptoms of TIA such as numbness weakness lateralizing. Patient denies side effects from medication. States taking it regularly.  Patient also  in for follow-up of elevated cholesterol. Doing well without complaints on current medication. Denies side effects of statin including myalgia and arthralgia and nausea. Also in today for liver function testing. Currently no chest pain, shortness of breath or other cardiovascular related symptoms noted.  Follow-up of diabetes. Patient does check blood sugar at home.  She did not bring any numbers with her today.  Does not remember the mild either.   Patient denies symptoms such as polyuria, polydipsia, excessive hunger, nausea No significant hypoglycemic spells noted. Medications reviewed. Pt reports taking them regularly. Pt. denies complication/adverse reaction today.   Patient presents for follow-up on  thyroid. The patient has a history of hypothyroidism for many years. It has been stable recently. Pt. denies any change in  voice, loss of hair, she does stay cold a lot and have decreased energy as well as noting that her weight is climbing.  Patient denies constipation and diarrhea. No myxedema. Medication is as noted below. Verified that pt is taking it daily on an empty stomach. Well tolerated.  Patient has been noted in the past to have osteoporosis.  She is not currently managing that with medication History Sophia [name] has a past medical history of Chronic kidney disease, Diabetes mellitus without complication ([place]), Hyperlipidemia, and Hypertension.   She has a past surgical  history that includes Cholecystectomy; Sigmoidoscopy; and Esophagogastroduodenoscopy (N/A, 04/29/2015).   Her family history includes Colon cancer in her sister; Heart disease in her brother, father, and sister.She reports that  has never smoked. she has never used smokeless tobacco. She reports that she does not drink alcohol or use drugs.  Current Outpatient Medications on File Prior to Visit  Medication Sig Dispense Refill  . blood glucose meter kit and supplies KIT Use up to 4x daily prn 1 each 0  . Calcium-Magnesium 500-250 MG TABS Take 1 tablet by mouth daily with supper. 30 each 5  . VITAMIN D, ERGOCALCIFEROL, PO Take by mouth.     No current facility-administered medications on file prior to visit.     ROS Review of Systems  Constitutional: Negative for activity change, appetite change and fever.  HENT: Negative for congestion, rhinorrhea and sore throat.   Eyes: Negative for visual disturbance.  Respiratory: Negative for cough and shortness of breath.   Cardiovascular: Negative for chest pain and palpitations.  Gastrointestinal: Negative for abdominal pain, diarrhea and nausea.  Genitourinary: Positive for enuresis (During the day she has to wear a pad) and frequency. Negative for dysuria.  Musculoskeletal: Negative for arthralgias (Decreased grip particularly in the right hand from arthritis) and myalgias.    Objective:  BP (!) 177/77   Pulse 60   Temp (!) 97.4 F (36.3 C) (Oral)   Ht 5' 2" (1.575 m)   Wt 138 lb (62.6 kg)   BMI 25.24 kg/m   BP Readings from Last 3 Encounters:  05/03/17 (!) 177/77  09/07/16 (!) 174/84  06/08/16 (!) 150/76    Wt Readings  from Last 3 Encounters:  05/03/17 138 lb (62.6 kg)  09/07/16 132 lb (59.9 kg)  06/08/16 128 lb (58.1 kg)     Physical Exam  Constitutional: She is oriented to person, place, and time. She appears well-developed and well-nourished. No distress.  HENT:  Head: Normocephalic and atraumatic.  Right Ear: External  ear normal.  Left Ear: External ear normal.  Nose: Nose normal.  Mouth/Throat: Oropharynx is clear and moist.  Eyes: Conjunctivae and EOM are normal. Pupils are equal, round, and reactive to light.  Neck: Normal range of motion. Neck supple. No thyromegaly present.  Cardiovascular: Normal rate, regular rhythm and normal heart sounds.  No murmur heard. Pulmonary/Chest: Effort normal and breath sounds normal. No respiratory distress. She has no wheezes. She has no rales.  Abdominal: Soft. Bowel sounds are normal. She exhibits no distension. There is no tenderness.  Lymphadenopathy:    She has no cervical adenopathy.  Neurological: She is alert and oriented to person, place, and time. She has normal reflexes.  Skin: Skin is warm and dry.  Psychiatric: She has a normal mood and affect. Her behavior is normal. Judgment and thought content normal.    Diabetic Foot Exam - Simple   No data filed        Assessment & Plan:   Amarisa was seen today for diabetes.  Diagnoses and all orders for this visit:  Essential hypertension -     CBC with Differential/Platelet -     CMP14+EGFR  Diabetes mellitus without complication ([facility]) -     Bayer DCA Hb A1c Waived  Vitamin D deficiency -     DG [facility] DEXA  Mixed hyperlipidemia -     Lipid panel  Hypothyroidism, unspecified type -     TSH -     T4, Free  Osteoporosis, unspecified osteoporosis type, unspecified pathological fracture presence -     DG [facility] DEXA  Continuous leakage of urine -     Urine Culture -     Urinalysis  Other orders -     carvedilol (COREG) 6.25 MG tablet; TAKE 1 TABLET BY MOUTH TWICE DAILY WITH MEALS FOR  BLOOD  PRESSURE/HEART/KIDNEY -     levothyroxine (SYNTHROID, LEVOTHROID) 75 MCG tablet; TAKE 1 TABLET BY MOUTH ONCE DAILY BEFORE BREAKFAST ON AN EMPTY STOMACH -     omeprazole (PRILOSEC) 40 MG capsule; TAKE 1 CAPSULE ON AN EMPTY STOMACH 1 HOUR BEFORE BREAKFAST AND ANOTHER AT BEDTIME (2 HOURS AFTER YOUR LAST  FOOD AND DRINK EXCEPT WATER) -     simvastatin (ZOCOR) 40 MG tablet; TAKE 1/2 (ONE-HALF) TABLET BY MOUTH ONCE DAILY AT  6PM -     alendronate (FOSAMAX) 70 MG tablet; Take 1 tablet (70 mg total) by mouth every 7 (seven) days. Take with a full glass of water on an empty stomach.   I am having Sophia [name] start on alendronate. I am also having her maintain her Calcium-Magnesium, blood glucose meter kit and supplies, (VITAMIN D, ERGOCALCIFEROL, PO), carvedilol, levothyroxine, omeprazole, and simvastatin.  Meds ordered this encounter  Medications  . carvedilol (COREG) 6.25 MG tablet    Sig: TAKE 1 TABLET BY MOUTH TWICE DAILY WITH MEALS FOR  BLOOD  PRESSURE/HEART/KIDNEY    Dispense:  90 tablet    Refill:  1    Needs to be seen before next refill  . levothyroxine (SYNTHROID, LEVOTHROID) 75 MCG tablet    Sig: TAKE 1 TABLET BY MOUTH ONCE DAILY BEFORE BREAKFAST ON AN EMPTY STOMACH  Dispense:  90 tablet    Refill:  1  . omeprazole (PRILOSEC) 40 MG capsule    Sig: TAKE 1 CAPSULE ON AN EMPTY STOMACH 1 HOUR BEFORE BREAKFAST AND ANOTHER AT BEDTIME (2 HOURS AFTER YOUR LAST FOOD AND DRINK EXCEPT WATER)    Dispense:  90 capsule    Refill:  1    Needs to be seen before next refill  . simvastatin (ZOCOR) 40 MG tablet    Sig: TAKE 1/2 (ONE-HALF) TABLET BY MOUTH ONCE DAILY AT  6PM    Dispense:  90 tablet    Refill:  1  . alendronate (FOSAMAX) 70 MG tablet    Sig: Take 1 tablet (70 mg total) by mouth every 7 (seven) days. Take with a full glass of water on an empty stomach.    Dispense:  4 tablet    Refill:  11     Follow-up: Return in about 3 months (around 08/01/2017).  Claretta Fraise, M.D.

## 2017-05-04 ENCOUNTER — Encounter: Payer: Self-pay | Admitting: Family Medicine

## 2017-05-04 LAB — CBC WITH DIFFERENTIAL/PLATELET
Basophils Absolute: 0 10*3/uL (ref 0.0–0.2)
Basos: 1 %
EOS (ABSOLUTE): 0.3 10*3/uL (ref 0.0–0.4)
Eos: 5 %
Hematocrit: 39.1 % (ref 34.0–46.6)
Hemoglobin: 12.7 g/dL (ref 11.1–15.9)
Immature Grans (Abs): 0 10*3/uL (ref 0.0–0.1)
Immature Granulocytes: 0 %
Lymphocytes Absolute: 1.8 10*3/uL (ref 0.7–3.1)
Lymphs: 29 %
MCH: 32.5 pg (ref 26.6–33.0)
MCHC: 32.5 g/dL (ref 31.5–35.7)
MCV: 100 fL — ABNORMAL HIGH (ref 79–97)
Monocytes Absolute: 0.3 10*3/uL (ref 0.1–0.9)
Monocytes: 5 %
Neutrophils Absolute: 3.9 10*3/uL (ref 1.4–7.0)
Neutrophils: 60 %
Platelets: 289 10*3/uL (ref 150–379)
RBC: 3.91 x10E6/uL (ref 3.77–5.28)
RDW: 13.6 % (ref 12.3–15.4)
WBC: 6.4 10*3/uL (ref 3.4–10.8)

## 2017-05-04 LAB — CMP14+EGFR
ALT: 24 IU/L (ref 0–32)
AST: 30 IU/L (ref 0–40)
Albumin/Globulin Ratio: 1.6 (ref 1.2–2.2)
Albumin: 4.4 g/dL (ref 3.5–4.7)
Alkaline Phosphatase: 55 IU/L (ref 39–117)
BUN/Creatinine Ratio: 16 (ref 12–28)
BUN: 18 mg/dL (ref 8–27)
Bilirubin Total: 0.5 mg/dL (ref 0.0–1.2)
CO2: 23 mmol/L (ref 20–29)
Calcium: 9.6 mg/dL (ref 8.7–10.3)
Chloride: 106 mmol/L (ref 96–106)
Creatinine, Ser: 1.16 mg/dL — ABNORMAL HIGH (ref 0.57–1.00)
GFR calc Af Amer: 50 mL/min/{1.73_m2} — ABNORMAL LOW (ref >59)
GFR calc non Af Amer: 44 mL/min/{1.73_m2} — ABNORMAL LOW (ref >59)
Globulin, Total: 2.8 g/dL (ref 1.5–4.5)
Glucose: 114 mg/dL — ABNORMAL HIGH (ref 65–99)
Potassium: 4.4 mmol/L (ref 3.5–5.2)
Sodium: 145 mmol/L — ABNORMAL HIGH (ref 134–144)
Total Protein: 7.2 g/dL (ref 6.0–8.5)

## 2017-05-04 LAB — LIPID PANEL
Chol/HDL Ratio: 3.3 ratio (ref 0.0–4.4)
Cholesterol, Total: 206 mg/dL — ABNORMAL HIGH (ref 100–199)
HDL: 62 mg/dL (ref >39)
LDL Calculated: 118 mg/dL — ABNORMAL HIGH (ref 0–99)
Triglycerides: 128 mg/dL (ref 0–149)
VLDL Cholesterol Cal: 26 mg/dL (ref 5–40)

## 2017-05-04 LAB — T4, FREE: Free T4: 1.44 ng/dL (ref 0.82–1.77)

## 2017-05-04 LAB — URINE CULTURE

## 2017-05-04 LAB — TSH: TSH: 3.07 u[IU]/mL (ref 0.450–4.500)

## 2017-05-27 DIAGNOSIS — H353132 Nonexudative age-related macular degeneration, bilateral, intermediate dry stage: Secondary | ICD-10-CM | POA: Diagnosis not present

## 2017-05-27 DIAGNOSIS — Z961 Presence of intraocular lens: Secondary | ICD-10-CM | POA: Diagnosis not present

## 2017-05-28 ENCOUNTER — Encounter: Payer: Self-pay | Admitting: Family Medicine

## 2017-05-28 ENCOUNTER — Ambulatory Visit (INDEPENDENT_AMBULATORY_CARE_PROVIDER_SITE_OTHER): Payer: Medicare Other | Admitting: Family Medicine

## 2017-05-28 VITALS — BP 164/71 | HR 60 | Temp 97.1°F | Ht 62.0 in | Wt 137.0 lb

## 2017-05-28 DIAGNOSIS — I10 Essential (primary) hypertension: Secondary | ICD-10-CM | POA: Diagnosis not present

## 2017-05-28 MED ORDER — CARVEDILOL 6.25 MG PO TABS: ORAL_TABLET | ORAL | 1 refills | Status: DC

## 2017-06-01 ENCOUNTER — Encounter: Payer: Self-pay | Admitting: Family Medicine

## 2017-06-01 NOTE — Progress Notes (Signed)
Subjective:  Patient ID: Sophia [name], female    DOB: 1932-03-16  Age: 84 y.o. MRN: [id number]  CC: Blood Pressure Check (pt here today for BP check and she brought her log which shows elevated reading.)   HPI Sophia [name] presents for  follow-up of hypertension. Patient has no history of headache chest pain or shortness of breath or recent cough. Patient also denies symptoms of TIA such as focal numbness or weakness. Patient checks  blood pressure at home. Readings recently have been running rather high.   Patient denies side effects from medication. States taking it regularly.   History Sophia [name] has a past medical history of Chronic kidney disease, Diabetes mellitus without complication ([place]), Hyperlipidemia, and Hypertension.   She has a past surgical history that includes Cholecystectomy; Sigmoidoscopy; and Esophagogastroduodenoscopy (N/A, 04/29/2015).   Her family history includes Colon cancer in her sister; Heart disease in her brother, father, and sister.She reports that  has never smoked. she has never used smokeless tobacco. She reports that she does not drink alcohol or use drugs.  Current Outpatient Medications on File Prior to Visit  Medication Sig Dispense Refill  . alendronate (FOSAMAX) 70 MG tablet Take 1 tablet (70 mg total) by mouth every 7 (seven) days. Take with a full glass of water on an empty stomach. 4 tablet 11  . blood glucose meter kit and supplies KIT Use up to 4x daily prn 1 each 0  . Calcium-Magnesium 500-250 MG TABS Take 1 tablet by mouth daily with supper. 30 each 5  . levothyroxine (SYNTHROID, LEVOTHROID) 75 MCG tablet TAKE 1 TABLET BY MOUTH ONCE DAILY BEFORE BREAKFAST ON AN EMPTY STOMACH 90 tablet 1  . omeprazole (PRILOSEC) 40 MG capsule TAKE 1 CAPSULE ON AN EMPTY STOMACH 1 HOUR BEFORE BREAKFAST AND ANOTHER AT BEDTIME (2 HOURS AFTER YOUR LAST FOOD AND DRINK EXCEPT WATER) 90 capsule 1  . simvastatin (ZOCOR) 40 MG tablet TAKE 1/2 (ONE-HALF) TABLET  BY MOUTH ONCE DAILY AT  6PM 90 tablet 1  . VITAMIN D, ERGOCALCIFEROL, PO Take by mouth.     No current facility-administered medications on file prior to visit.     ROS Review of Systems  Constitutional: Negative for activity change, appetite change and fever.  HENT: Negative for congestion, rhinorrhea and sore throat.   Eyes: Negative for visual disturbance.  Respiratory: Negative for cough and shortness of breath.   Cardiovascular: Negative for chest pain and palpitations.  Gastrointestinal: Negative for abdominal pain, diarrhea and nausea.  Genitourinary: Negative for dysuria.  Musculoskeletal: Negative for arthralgias and myalgias.    Objective:  BP (!) 164/71   Pulse 60   Temp (!) 97.1 F (36.2 C) (Oral)   Ht _0  (1.575 m)   Wt 137 lb (62.1 kg)   BMI 25.06 kg/m   BP Readings from Last 3 Encounters:  05/28/17 (!) 164/71  05/03/17 (!) 177/77  09/07/16 (!) 174/84    Wt Readings from Last 3 Encounters:  05/28/17 137 lb (62.1 kg)  05/03/17 138 lb (62.6 kg)  09/07/16 132 lb (59.9 kg)     Physical Exam  Constitutional: She is oriented to person, place, and time. She appears well-developed and well-nourished. No distress.  HENT:  Head: Normocephalic and atraumatic.  Right Ear: External ear normal.  Left Ear: External ear normal.  Nose: Nose normal.  Mouth/Throat: Oropharynx is clear and moist.  Eyes: Conjunctivae and EOM are normal. Pupils are equal, round, and reactive to light.  Neck: Normal range of  motion. Neck supple. No thyromegaly present.  Cardiovascular: Normal rate, regular rhythm and normal heart sounds.  No murmur heard. Pulmonary/Chest: Effort normal and breath sounds normal. No respiratory distress. She has no wheezes. She has no rales.  Abdominal: Soft. Bowel sounds are normal. She exhibits no distension. There is no tenderness.  Lymphadenopathy:    She has no cervical adenopathy.  Neurological: She is alert and oriented to person, place, and  time. She has normal reflexes.  Skin: Skin is warm and dry.  Psychiatric: She has a normal mood and affect. Her behavior is normal. Judgment and thought content normal.      Assessment & Plan:   There are no diagnoses linked to this encounter. Allergies as of 05/28/2017   No Known Allergies     Medication List        Accurate as of 05/28/17 11:59 PM. Always use your most recent med list.          alendronate 70 MG tablet Commonly known as:  FOSAMAX Take 1 tablet (70 mg total) by mouth every 7 (seven) days. Take with a full glass of water on an empty stomach.   blood glucose meter kit and supplies Kit Use up to 4x daily prn   Calcium-Magnesium 500-250 MG Tabs Take 1 tablet by mouth daily with supper.   carvedilol 6.25 MG tablet Commonly known as:  COREG TAKE 1 and 1/2  TABLET BY MOUTH TWICE DAILY WITH MEALS FOR  BLOOD  PRESSURE/HEART/KIDNEY   levothyroxine 75 MCG tablet Commonly known as:  SYNTHROID, LEVOTHROID TAKE 1 TABLET BY MOUTH ONCE DAILY BEFORE BREAKFAST ON AN EMPTY STOMACH   omeprazole 40 MG capsule Commonly known as:  PRILOSEC TAKE 1 CAPSULE ON AN EMPTY STOMACH 1 HOUR BEFORE BREAKFAST AND ANOTHER AT BEDTIME (2 HOURS AFTER YOUR LAST FOOD AND DRINK EXCEPT WATER)   simvastatin 40 MG tablet Commonly known as:  ZOCOR TAKE 1/2 (ONE-HALF) TABLET BY MOUTH ONCE DAILY AT  6PM   VITAMIN D (ERGOCALCIFEROL) PO Take by mouth.       Meds ordered this encounter  Medications  . carvedilol (COREG) 6.25 MG tablet    Sig: TAKE 1 and 1/2  TABLET BY MOUTH TWICE DAILY WITH MEALS FOR  BLOOD  PRESSURE/HEART/KIDNEY    Dispense:  90 tablet    Refill:  1      Follow-up: Return in about 4 weeks (around 06/25/2017).  Claretta Fraise, M.D.

## 2017-06-23 ENCOUNTER — Encounter: Payer: Self-pay | Admitting: Family Medicine

## 2017-06-23 ENCOUNTER — Ambulatory Visit (INDEPENDENT_AMBULATORY_CARE_PROVIDER_SITE_OTHER): Payer: Medicare Other | Admitting: Family Medicine

## 2017-06-23 VITALS — BP 182/69 | HR 58 | Temp 96.9°F | Ht 62.0 in | Wt 136.0 lb

## 2017-06-23 DIAGNOSIS — N182 Chronic kidney disease, stage 2 (mild): Secondary | ICD-10-CM | POA: Diagnosis not present

## 2017-06-23 DIAGNOSIS — I10 Essential (primary) hypertension: Secondary | ICD-10-CM

## 2017-06-23 MED ORDER — CARVEDILOL 12.5 MG PO TABS: 12.5000 mg | ORAL_TABLET | Freq: Two times a day (BID) | ORAL | 5 refills | Status: DC

## 2017-06-23 NOTE — Progress Notes (Signed)
Subjective:  Patient ID: Sophia [name], female    DOB: 08-24-1931  Age: 84 y.o. MRN: [id number]  CC: Blood Pressure Check (pt here today to check BP after decreasing her Carvedilol and she did bring BP log)   HPI Sophia [name] presents for  follow-up of hypertension.  Patient does not think that the increase in carvedilol may be any difference.  It did not cause any side effects either about.  Patient has no history of headache chest pain or shortness of breath or recent cough. Patient also denies symptoms of TIA such as focal numbness or weakness. Patient checks  blood pressure at home. Readings recently have been in the 150s-160s.. Patient denies side effects from medication. States taking it regularly.   History Sophia [name] has a past medical history of Chronic kidney disease, Diabetes mellitus without complication ([place]), Hyperlipidemia, and Hypertension.   She has a past surgical history that includes Cholecystectomy; Sigmoidoscopy; and Esophagogastroduodenoscopy (N/A, 04/29/2015).   Her family history includes Colon cancer in her sister; Heart disease in her brother, father, and sister.She reports that  has never smoked. she has never used smokeless tobacco. She reports that she does not drink alcohol or use drugs.  Current Outpatient Medications on File Prior to Visit  Medication Sig Dispense Refill  . alendronate (FOSAMAX) 70 MG tablet Take 1 tablet (70 mg total) by mouth every 7 (seven) days. Take with a full glass of water on an empty stomach. 4 tablet 11  . blood glucose meter kit and supplies KIT Use up to 4x daily prn 1 each 0  . Calcium-Magnesium 500-250 MG TABS Take 1 tablet by mouth daily with supper. 30 each 5  . levothyroxine (SYNTHROID, LEVOTHROID) 75 MCG tablet TAKE 1 TABLET BY MOUTH ONCE DAILY BEFORE BREAKFAST ON AN EMPTY STOMACH 90 tablet 1  . omeprazole (PRILOSEC) 40 MG capsule TAKE 1 CAPSULE ON AN EMPTY STOMACH 1 HOUR BEFORE BREAKFAST AND ANOTHER AT BEDTIME (2  HOURS AFTER YOUR LAST FOOD AND DRINK EXCEPT WATER) 90 capsule 1  . simvastatin (ZOCOR) 40 MG tablet TAKE 1/2 (ONE-HALF) TABLET BY MOUTH ONCE DAILY AT  6PM 90 tablet 1  . VITAMIN D, ERGOCALCIFEROL, PO Take by mouth.     No current facility-administered medications on file prior to visit.     ROS Review of Systems  Constitutional: Negative for activity change, appetite change and fever.  HENT: Negative for congestion, rhinorrhea and sore throat.   Eyes: Negative for visual disturbance.  Respiratory: Negative for cough and shortness of breath.   Cardiovascular: Negative for chest pain and palpitations.  Gastrointestinal: Negative for abdominal pain, diarrhea and nausea.  Genitourinary: Negative for dysuria.  Musculoskeletal: Negative for arthralgias and myalgias.    Objective:  BP (!) 182/69   Pulse (!) 58   Temp (!) 96.9 F (36.1 C) (Oral)   Ht 5' 2" (1.575 m)   Wt 136 lb (61.7 kg)   BMI 24.87 kg/m   BP Readings from Last 3 Encounters:  06/23/17 (!) 182/69  05/28/17 (!) 164/71  05/03/17 (!) 177/77    Wt Readings from Last 3 Encounters:  06/23/17 136 lb (61.7 kg)  05/28/17 137 lb (62.1 kg)  05/03/17 138 lb (62.6 kg)     Physical Exam  Constitutional: She is oriented to person, place, and time. She appears well-developed and well-nourished. No distress.  HENT:  Head: Normocephalic and atraumatic.  Eyes: Conjunctivae are normal. Pupils are equal, round, and reactive to light.  Neck: Normal range of  motion. Neck supple. No thyromegaly present.  Cardiovascular: Normal rate, regular rhythm and normal heart sounds.  No murmur heard. Pulmonary/Chest: Effort normal and breath sounds normal. No respiratory distress. She has no wheezes. She has no rales.  Abdominal: Soft. There is no tenderness.  Musculoskeletal: Normal range of motion.  Neurological: She is alert and oriented to person, place, and time.  Skin: Skin is warm and dry.  Psychiatric: She has a normal mood and  affect. Her behavior is normal. Judgment and thought content normal.      Assessment & Plan:   Sophia [name] was seen today for blood pressure check.  Diagnoses and all orders for this visit:  Accelerated hypertension  Stage 2 chronic kidney disease  Other orders -     carvedilol (COREG) 12.5 MG tablet; Take 1 tablet (12.5 mg total) by mouth 2 (two) times daily with a meal.   Allergies as of 06/23/2017   No Known Allergies     Medication List        Accurate as of 06/23/17  3:41 PM. Always use your most recent med list.          alendronate 70 MG tablet Commonly known as:  FOSAMAX Take 1 tablet (70 mg total) by mouth every 7 (seven) days. Take with a full glass of water on an empty stomach.   blood glucose meter kit and supplies Kit Use up to 4x daily prn   Calcium-Magnesium 500-250 MG Tabs Take 1 tablet by mouth daily with supper.   carvedilol 12.5 MG tablet Commonly known as:  COREG Take 1 tablet (12.5 mg total) by mouth 2 (two) times daily with a meal.   levothyroxine 75 MCG tablet Commonly known as:  SYNTHROID, LEVOTHROID TAKE 1 TABLET BY MOUTH ONCE DAILY BEFORE BREAKFAST ON AN EMPTY STOMACH   omeprazole 40 MG capsule Commonly known as:  PRILOSEC TAKE 1 CAPSULE ON AN EMPTY STOMACH 1 HOUR BEFORE BREAKFAST AND ANOTHER AT BEDTIME (2 HOURS AFTER YOUR LAST FOOD AND DRINK EXCEPT WATER)   simvastatin 40 MG tablet Commonly known as:  ZOCOR TAKE 1/2 (ONE-HALF) TABLET BY MOUTH ONCE DAILY AT  6PM   VITAMIN D (ERGOCALCIFEROL) PO Take by mouth.       Meds ordered this encounter  Medications  . carvedilol (COREG) 12.5 MG tablet    Sig: Take 1 tablet (12.5 mg total) by mouth 2 (two) times daily with a meal.    Dispense:  60 tablet    Refill:  5    Went ahead and increase the Coreg.  She can take 2 twice a day of her current dose.  Then when that runs out she should go to the 12-1/2 mg 1 twice daily  Follow-up: Return in about 1 month (around 07/21/2017).  Claretta Fraise, M.D.

## 2017-07-28 DIAGNOSIS — Z23 Encounter for immunization: Secondary | ICD-10-CM | POA: Diagnosis not present

## 2017-07-28 DIAGNOSIS — H00025 Hordeolum internum left lower eyelid: Secondary | ICD-10-CM | POA: Diagnosis not present

## 2017-08-03 ENCOUNTER — Ambulatory Visit: Payer: Medicare Other | Admitting: Family Medicine

## 2017-08-05 ENCOUNTER — Other Ambulatory Visit: Payer: Self-pay | Admitting: *Deleted

## 2017-08-05 MED ORDER — OMEPRAZOLE 40 MG PO CPDR: DELAYED_RELEASE_CAPSULE | ORAL | 0 refills | Status: DC

## 2017-08-10 ENCOUNTER — Ambulatory Visit: Payer: Medicare Other | Admitting: Family Medicine

## 2017-08-12 ENCOUNTER — Ambulatory Visit: Payer: Medicare Other | Admitting: *Deleted

## 2017-08-13 ENCOUNTER — Encounter: Payer: Self-pay | Admitting: Family Medicine

## 2017-08-13 ENCOUNTER — Ambulatory Visit (INDEPENDENT_AMBULATORY_CARE_PROVIDER_SITE_OTHER): Payer: Medicare Other | Admitting: Family Medicine

## 2017-08-13 VITALS — BP 175/78 | HR 60 | Ht 62.0 in | Wt 135.0 lb

## 2017-08-13 DIAGNOSIS — I10 Essential (primary) hypertension: Secondary | ICD-10-CM | POA: Diagnosis not present

## 2017-08-13 MED ORDER — CARVEDILOL 12.5 MG PO TABS: ORAL_TABLET | ORAL | 0 refills | Status: DC

## 2017-08-13 NOTE — Progress Notes (Signed)
Subjective:  Patient ID: Sophia [name], female    DOB: 03-28-1932  Age: 84 y.o. MRN: [id number]  CC: Blood Pressure Check (pt here today for follow up after increasing Carvedilol to 12.40m )   HPI SFilicia Scoginpresents for follow-up of hypertension. Patient has no history of headache chest pain or shortness of breath or recent cough. Patient also denies symptoms of TIA such as numbness weakness lateralizing. Patient checks  blood pressure at home and has  had multiple elevated readings recently. Patient denies side effects from his medication. States taking it regularly.   [facility] 08/13/2017 06/23/2017 05/03/2017  Decreased Interest 0 0 0  Down, Depressed, Hopeless 0 0 0  PHQ - 2 Score 0 0 0    History SKandisshas a past medical history of Chronic kidney disease, [place], Hyperlipidemia, and Hypertension.   She has a past surgical history that includes Cholecystectomy; Sigmoidoscopy; and Esophagogastroduodenoscopy (N/A, 04/29/2015).   Her family history includes Colon cancer in her sister; Heart disease in her brother, father, and sister.She reports that she has never smoked. She has never used smokeless tobacco. She reports that she does not drink alcohol or use drugs.    ROS Review of Systems  Constitutional: Negative for activity change, appetite change and fever.  HENT: Negative for congestion, rhinorrhea and sore throat.   Eyes: Negative for visual disturbance.  Respiratory: Negative for cough and shortness of breath.   Cardiovascular: Negative for chest pain and palpitations.  Gastrointestinal: Negative for abdominal pain, diarrhea and nausea.  Genitourinary: Negative for dysuria.  Musculoskeletal: Negative for arthralgias and myalgias.    Objective:  BP (!) 175/78   Pulse 60   Ht _0  (1.575 m)   Wt 135 lb (61.2 kg)   BMI 24.69 kg/m   BP Readings from Last 3 Encounters:  08/13/17 (!) 175/78    06/23/17 (!) 182/69  05/28/17 (!) 164/71    Wt Readings from Last 3 Encounters:  08/13/17 135 lb (61.2 kg)  06/23/17 136 lb (61.7 kg)  05/28/17 137 lb (62.1 kg)     Physical Exam  Constitutional: She is oriented to person, place, and time. She appears well-developed and well-nourished. No distress.  HENT:  Head: Normocephalic and atraumatic.  Eyes: Pupils are equal, round, and reactive to light. Conjunctivae are normal.  Neck: Normal range of motion. Neck supple. No thyromegaly present.  Cardiovascular: Normal rate, regular rhythm and normal heart sounds.  No murmur heard. Pulmonary/Chest: Effort normal and breath sounds normal. No respiratory distress. She has no wheezes. She has no rales.  Abdominal: Soft. Bowel sounds are normal.  Musculoskeletal: Normal range of motion.  Lymphadenopathy:    She has no cervical adenopathy.  Neurological: She is alert and oriented to person, place, and time.  Skin: Skin is warm and dry.  Psychiatric: She has a normal mood and affect. Her behavior is normal. Judgment and thought content normal.      Assessment & Plan:   SNyaziawas seen today for blood pressure check.  Diagnoses and all orders for this visit:  Essential hypertension -     DME Other see comment  Other orders -     carvedilol (COREG) 12.5 MG tablet; Take 1-1/2 in the AM and 1 in the PM for 1 wk.  Then 1-1/2 twice daily for 1 wk.  Then 2 AM and 1-1/2  PM for 1 week.  Then 2 AM & 2 PM  I have changed [name] Stiff's carvedilol. I am also having her maintain her Calcium-Magnesium, blood glucose meter kit and supplies, (VITAMIN D, ERGOCALCIFEROL, PO), levothyroxine, simvastatin, alendronate, and omeprazole.  Allergies as of 08/13/2017   No Known Allergies     Medication List        Accurate as of 08/13/17 11:59 PM. Always use your most recent med list.          alendronate 70 MG tablet Commonly known as:  FOSAMAX Take 1 tablet (70 mg total) by  mouth every 7 (seven) days. Take with a full glass of water on an empty stomach.   blood glucose meter kit and supplies Kit Use up to 4x daily prn   Calcium-Magnesium 500-250 MG Tabs Take 1 tablet by mouth daily with supper.   carvedilol 12.5 MG tablet Commonly known as:  COREG Take 1-1/2 in the AM and 1 in the PM for 1 wk.  Then 1-1/2 twice daily for 1 wk.  Then 2 AM and 1-1/2  PM for 1 week.  Then 2 AM & 2 PM   levothyroxine 75 MCG tablet Commonly known as:  SYNTHROID, LEVOTHROID TAKE 1 TABLET BY MOUTH ONCE DAILY BEFORE BREAKFAST ON AN EMPTY STOMACH   omeprazole 40 MG capsule Commonly known as:  PRILOSEC TAKE 1 CAPSULE ON AN EMPTY STOMACH 1 HOUR BEFORE BREAKFAST AND ANOTHER AT BEDTIME (2 HOURS AFTER YOUR LAST FOOD AND DRINK EXCEPT WATER)   simvastatin 40 MG tablet Commonly known as:  ZOCOR TAKE 1/2 (ONE-HALF) TABLET BY MOUTH ONCE DAILY AT  6PM   VITAMIN D (ERGOCALCIFEROL) PO Take by mouth.            Durable Medical Equipment  (From admission, onward)        Start     Ordered   08/13/17 0000  DME Other see comment    Comments:  BP Monitor DX I10   08/13/17 1107       Follow-up: Return in about 1 month (around 09/13/2017).  Claretta Fraise, M.D.

## 2017-08-15 ENCOUNTER — Encounter: Payer: Self-pay | Admitting: Family Medicine

## 2017-09-13 ENCOUNTER — Ambulatory Visit (INDEPENDENT_AMBULATORY_CARE_PROVIDER_SITE_OTHER): Payer: Medicare Other | Admitting: Family Medicine

## 2017-09-13 ENCOUNTER — Encounter: Payer: Self-pay | Admitting: Family Medicine

## 2017-09-13 VITALS — BP 144/60 | HR 62 | Ht 62.0 in | Wt 137.1 lb

## 2017-09-13 DIAGNOSIS — I10 Essential (primary) hypertension: Secondary | ICD-10-CM | POA: Diagnosis not present

## 2017-09-13 MED ORDER — CARVEDILOL 12.5 MG PO TABS: ORAL_TABLET | ORAL | 0 refills | Status: DC

## 2017-09-13 MED ORDER — OMEPRAZOLE 40 MG PO CPDR: DELAYED_RELEASE_CAPSULE | ORAL | 0 refills | Status: DC

## 2017-09-13 NOTE — Progress Notes (Signed)
Subjective:  Patient ID: Sophia [name], female    DOB: 1931-06-06  Age: 84 y.o. MRN: [id number]  CC: Blood Pressure Check (pt here today for BP check after increasing her Carvedilol )   HPI Nicholl Onstott presents for  follow-up of hypertension. Patient has no history of headache chest pain or shortness of breath or recent cough patient had one episode of palpitations that was self-limited this month.  It is not associated with any type of syncope chest pain or dyspnea.  Additionally she mentions that she has had some occasional minimal lightheadedness that is totally manageable and does not cause her to be significantly off balance as in enough to cause her to fall etc.. Patient also denies symptoms of TIA such as focal numbness or weakness. Patient denies any other side effects from medication. States taking it regularly.  Blood pressure log reviewed.  It shows that over the last 10 days she has been taking the carvedilol 2 tablets twice daily.  During this time she has had twice daily readings that show 6 readings over 195 systolic.  The diastolics remain in the 09-32 range.  Pulse remains in the 60s.   History Gwenivere has a past medical history of Chronic kidney disease, Diabetes mellitus without complication ([place]), Hyperlipidemia, and Hypertension.   She has a past surgical history that includes Cholecystectomy; Sigmoidoscopy; and Esophagogastroduodenoscopy (N/A, 04/29/2015).   Her family history includes Colon cancer in her sister; Heart disease in her brother, father, and sister.She reports that she has never smoked. She has never used smokeless tobacco. She reports that she does not drink alcohol or use drugs.  Current Outpatient Medications on File Prior to Visit  Medication Sig Dispense Refill  . alendronate (FOSAMAX) 70 MG tablet Take 1 tablet (70 mg total) by mouth every 7 (seven) days. Take with a full glass of water on an empty stomach. 4 tablet 11  . blood glucose meter  kit and supplies KIT Use up to 4x daily prn 1 each 0  . Calcium-Magnesium 500-250 MG TABS Take 1 tablet by mouth daily with supper. 30 each 5  . levothyroxine (SYNTHROID, LEVOTHROID) 75 MCG tablet TAKE 1 TABLET BY MOUTH ONCE DAILY BEFORE BREAKFAST ON AN EMPTY STOMACH 90 tablet 1  . simvastatin (ZOCOR) 40 MG tablet TAKE 1/2 (ONE-HALF) TABLET BY MOUTH ONCE DAILY AT  6PM 90 tablet 1  . VITAMIN D, ERGOCALCIFEROL, PO Take by mouth.     No current facility-administered medications on file prior to visit.     ROS Review of Systems  Constitutional: Negative.   HENT: Negative.   Eyes: Negative for visual disturbance.  Respiratory: Negative for shortness of breath.   Cardiovascular: Positive for palpitations. Negative for chest pain.  Gastrointestinal: Negative for abdominal pain.  Musculoskeletal: Negative for arthralgias.    Objective:  BP (!) 144/60   Pulse 62   Ht '5\' 2"'  (1.575 m)   Wt 137 lb 2 oz (62.2 kg)   BMI 25.08 kg/m   BP Readings from Last 3 Encounters:  09/13/17 (!) 144/60  08/13/17 (!) 175/78  06/23/17 (!) 182/69    Wt Readings from Last 3 Encounters:  09/13/17 137 lb 2 oz (62.2 kg)  08/13/17 135 lb (61.2 kg)  06/23/17 136 lb (61.7 kg)     Physical Exam  Constitutional: She is oriented to person, place, and time. She appears well-developed and well-nourished. No distress.  Cardiovascular: Normal rate and regular rhythm.  Pulmonary/Chest: Breath sounds normal.  Neurological: She is  alert and oriented to person, place, and time.  Skin: Skin is warm and dry.  Psychiatric: She has a normal mood and affect.      Assessment & Plan:   Mariesha was seen today for blood pressure check.  Diagnoses and all orders for this visit:  Essential hypertension  Other orders -     carvedilol (COREG) 12.5 MG tablet; Take 3 tablets by mouth twice daily. -     omeprazole (PRILOSEC) 40 MG capsule; TAKE 1 CAPSULE ON AN EMPTY STOMACH 1 HOUR BEFORE BREAKFAST AND ANOTHER AT  BEDTIME (2 HOURS AFTER YOUR LAST FOOD AND DRINK EXCEPT WATER)   Allergies as of 09/13/2017   No Known Allergies     Medication List        Accurate as of 09/13/17 11:05 AM. Always use your most recent med list.          alendronate 70 MG tablet Commonly known as:  FOSAMAX Take 1 tablet (70 mg total) by mouth every 7 (seven) days. Take with a full glass of water on an empty stomach.   blood glucose meter kit and supplies Kit Use up to 4x daily prn   Calcium-Magnesium 500-250 MG Tabs Take 1 tablet by mouth daily with supper.   carvedilol 12.5 MG tablet Commonly known as:  COREG Take 3 tablets by mouth twice daily.   levothyroxine 75 MCG tablet Commonly known as:  SYNTHROID, LEVOTHROID TAKE 1 TABLET BY MOUTH ONCE DAILY BEFORE BREAKFAST ON AN EMPTY STOMACH   omeprazole 40 MG capsule Commonly known as:  PRILOSEC TAKE 1 CAPSULE ON AN EMPTY STOMACH 1 HOUR BEFORE BREAKFAST AND ANOTHER AT BEDTIME (2 HOURS AFTER YOUR LAST FOOD AND DRINK EXCEPT WATER)   simvastatin 40 MG tablet Commonly known as:  ZOCOR TAKE 1/2 (ONE-HALF) TABLET BY MOUTH ONCE DAILY AT  6PM   VITAMIN D (ERGOCALCIFEROL) PO Take by mouth.       Meds ordered this encounter  Medications  . carvedilol (COREG) 12.5 MG tablet    Sig: Take 3 tablets by mouth twice daily.    Dispense:  180 tablet    Refill:  0  . omeprazole (PRILOSEC) 40 MG capsule    Sig: TAKE 1 CAPSULE ON AN EMPTY STOMACH 1 HOUR BEFORE BREAKFAST AND ANOTHER AT BEDTIME (2 HOURS AFTER YOUR LAST FOOD AND DRINK EXCEPT WATER)    Dispense:  180 capsule    Refill:  0    Due to the syncope and continued high blood pressure one a try to go with a little bit higher dose than usual.  If we do not see good response then will see her back in a month and to decrease to 25 mg twice daily and find to titrate.  Of note is that based on her palpitations she appears to be euthyroid clinically otherwise and review of her previous thyroid blood test from 05/03/2017  she does not show signs of hyperthyroidism.  Her heart rate is borderline.  If it should drop further into the 50s we will also need to cut back on her dose. Follow-up: Return in about 1 month (around 10/13/2017).  Claretta Fraise, M.D.

## 2017-10-13 ENCOUNTER — Encounter: Payer: Self-pay | Admitting: Family Medicine

## 2017-10-13 ENCOUNTER — Ambulatory Visit (INDEPENDENT_AMBULATORY_CARE_PROVIDER_SITE_OTHER): Payer: Medicare Other | Admitting: Family Medicine

## 2017-10-13 VITALS — BP 151/67 | HR 53 | Temp 97.2°F | Ht 62.0 in | Wt 135.0 lb

## 2017-10-13 DIAGNOSIS — E782 Mixed hyperlipidemia: Secondary | ICD-10-CM | POA: Diagnosis not present

## 2017-10-13 DIAGNOSIS — I10 Essential (primary) hypertension: Secondary | ICD-10-CM

## 2017-10-13 DIAGNOSIS — E038 Other specified hypothyroidism: Secondary | ICD-10-CM | POA: Diagnosis not present

## 2017-10-13 MED ORDER — CARVEDILOL 25 MG PO TABS: 25.0000 mg | ORAL_TABLET | Freq: Two times a day (BID) | ORAL | 0 refills | Status: DC

## 2017-10-13 MED ORDER — OLMESARTAN MEDOXOMIL 20 MG PO TABS: 10.0000 mg | ORAL_TABLET | Freq: Every day | ORAL | 0 refills | Status: DC

## 2017-10-15 ENCOUNTER — Other Ambulatory Visit: Payer: Medicare Other

## 2017-10-15 DIAGNOSIS — E782 Mixed hyperlipidemia: Secondary | ICD-10-CM

## 2017-10-15 DIAGNOSIS — E038 Other specified hypothyroidism: Secondary | ICD-10-CM

## 2017-10-16 LAB — CMP14+EGFR
ALT: 12 IU/L (ref 0–32)
AST: 19 IU/L (ref 0–40)
Albumin/Globulin Ratio: 1.8 (ref 1.2–2.2)
Albumin: 4.2 g/dL (ref 3.5–4.7)
Alkaline Phosphatase: 53 IU/L (ref 39–117)
BUN/Creatinine Ratio: 13 (ref 12–28)
BUN: 15 mg/dL (ref 8–27)
Bilirubin Total: 0.6 mg/dL (ref 0.0–1.2)
CO2: 21 mmol/L (ref 20–29)
Calcium: 9.1 mg/dL (ref 8.7–10.3)
Chloride: 108 mmol/L — ABNORMAL HIGH (ref 96–106)
Creatinine, Ser: 1.2 mg/dL — ABNORMAL HIGH (ref 0.57–1.00)
GFR calc Af Amer: 48 mL/min/{1.73_m2} — ABNORMAL LOW (ref >59)
GFR calc non Af Amer: 42 mL/min/{1.73_m2} — ABNORMAL LOW (ref >59)
Globulin, Total: 2.3 g/dL (ref 1.5–4.5)
Glucose: 101 mg/dL — ABNORMAL HIGH (ref 65–99)
Potassium: 4.3 mmol/L (ref 3.5–5.2)
Sodium: 143 mmol/L (ref 134–144)
Total Protein: 6.5 g/dL (ref 6.0–8.5)

## 2017-10-16 LAB — LIPID PANEL
Chol/HDL Ratio: 3.7 ratio (ref 0.0–4.4)
Cholesterol, Total: 202 mg/dL — ABNORMAL HIGH (ref 100–199)
HDL: 54 mg/dL (ref >39)
LDL Calculated: 114 mg/dL — ABNORMAL HIGH (ref 0–99)
Triglycerides: 170 mg/dL — ABNORMAL HIGH (ref 0–149)
VLDL Cholesterol Cal: 34 mg/dL (ref 5–40)

## 2017-10-16 LAB — TSH: TSH: 2.23 u[IU]/mL (ref 0.450–4.500)

## 2017-10-16 LAB — T4, FREE: Free T4: 1.52 ng/dL (ref 0.82–1.77)

## 2017-10-17 ENCOUNTER — Encounter: Payer: Self-pay | Admitting: Family Medicine

## 2017-10-17 NOTE — Progress Notes (Signed)
Subjective:  Patient ID: Sophia [name], female    DOB: 1932-05-03  Age: 84 y.o. MRN: [id number]  CC: Hypertension (pt here today for 1 month follow up to check BP after increasing her Carvedilol)   HPI Sophia [name] presents for  follow-up of hypertension. Patient has no history of headache chest pain or shortness of breath or recent cough. Patient also denies symptoms of TIA such as focal numbness or weakness. Patient denies side effects from medication. No recent dizzy spells. States taking it regularly. Patient in for follow-up of elevated cholesterol. Doing well without complaints on current medication. Denies side effects of statin including myalgia and arthralgia and nausea. Also in today for liver function testing. Currently no chest pain, shortness of breath or other cardiovascular related symptoms noted.  Patient presents for follow-up on  thyroid. The patient has a history of hypothyroidism for many years. It has been stable recently.  She is concerned about recent increasing loss of hair.  Pt. denies any change in  voice,  heat or cold intolerance. Energy level has been adequate to good. Patient denies constipation and diarrhea. No myxedema. Medication is as noted below. Verified that pt is taking it daily on an empty stomach. Well tolerated.  History Sophia [name] has a past medical history of Chronic kidney disease, Diabetes mellitus without complication ([place]), Hyperlipidemia, and Hypertension.   She has a past surgical history that includes Cholecystectomy; Sigmoidoscopy; and Esophagogastroduodenoscopy (N/A, 04/29/2015).   Her family history includes Colon cancer in her sister; Heart disease in her brother, father, and sister.She reports that she has never smoked. She has never used smokeless tobacco. She reports that she does not drink alcohol or use drugs.  Current Outpatient Medications on File Prior to Visit  Medication Sig Dispense Refill  . alendronate (FOSAMAX) 70 MG  tablet Take 1 tablet (70 mg total) by mouth every 7 (seven) days. Take with a full glass of water on an empty stomach. 4 tablet 11  . blood glucose meter kit and supplies KIT Use up to 4x daily prn 1 each 0  . Calcium-Magnesium 500-250 MG TABS Take 1 tablet by mouth daily with supper. 30 each 5  . levothyroxine (SYNTHROID, LEVOTHROID) 75 MCG tablet TAKE 1 TABLET BY MOUTH ONCE DAILY BEFORE BREAKFAST ON AN EMPTY STOMACH 90 tablet 1  . omeprazole (PRILOSEC) 40 MG capsule TAKE 1 CAPSULE ON AN EMPTY STOMACH 1 HOUR BEFORE BREAKFAST AND ANOTHER AT BEDTIME (2 HOURS AFTER YOUR LAST FOOD AND DRINK EXCEPT WATER) 180 capsule 0  . simvastatin (ZOCOR) 40 MG tablet TAKE 1/2 (ONE-HALF) TABLET BY MOUTH ONCE DAILY AT  6PM 90 tablet 1  . VITAMIN D, ERGOCALCIFEROL, PO Take by mouth.     No current facility-administered medications on file prior to visit.     ROS Review of Systems  Constitutional: Negative.   HENT: Negative.   Eyes: Negative for visual disturbance.  Respiratory: Negative for shortness of breath.   Cardiovascular: Negative for chest pain.  Gastrointestinal: Negative for abdominal pain.  Musculoskeletal: Negative for arthralgias.    Objective:  BP (!) 151/67   Pulse (!) 53   Temp (!) 97.2 F (36.2 C) (Oral)   Ht 5' 2" (1.575 m)   Wt 135 lb (61.2 kg)   BMI 24.69 kg/m   BP Readings from Last 3 Encounters:  10/13/17 (!) 151/67  09/13/17 (!) 144/60  08/13/17 (!) 175/78    Wt Readings from Last 3 Encounters:  10/13/17 135 lb (61.2 kg)  09/13/17  137 lb 2 oz (62.2 kg)  08/13/17 135 lb (61.2 kg)     Physical Exam  Constitutional: She is oriented to person, place, and time. She appears well-developed and well-nourished. No distress.  HENT:  Head: Normocephalic and atraumatic.  Eyes: Pupils are equal, round, and reactive to light. Conjunctivae are normal.  Neck: Normal range of motion. Neck supple. No thyromegaly present.  Cardiovascular: Normal rate, regular rhythm and normal  heart sounds.  No murmur heard. Pulmonary/Chest: Effort normal and breath sounds normal. No respiratory distress. She has no wheezes. She has no rales.  Abdominal: Soft. There is no tenderness.  Musculoskeletal: Normal range of motion.  Neurological: She is alert and oriented to person, place, and time.  Skin: Skin is warm and dry.  Psychiatric: She has a normal mood and affect. Her behavior is normal. Judgment and thought content normal.      Assessment & Plan:   Sophia [name] was seen today for hypertension.  Diagnoses and all orders for this visit:  Other specified hypothyroidism -     TSH; Future -     T4, Free; Future  Mixed hyperlipidemia -     CMP14+EGFR; Future -     Lipid panel; Future  Essential hypertension  Other orders -     olmesartan (BENICAR) 20 MG tablet; Take 0.5 tablets (10 mg total) by mouth daily. -     carvedilol (COREG) 25 MG tablet; Take 1 tablet (25 mg total) by mouth 2 (two) times daily with a meal.   Allergies as of 10/13/2017   No Known Allergies     Medication List        Accurate as of 10/13/17 11:59 PM. Always use your most recent med list.          alendronate 70 MG tablet Commonly known as:  FOSAMAX Take 1 tablet (70 mg total) by mouth every 7 (seven) days. Take with a full glass of water on an empty stomach.   blood glucose meter kit and supplies Kit Use up to 4x daily prn   Calcium-Magnesium 500-250 MG Tabs Take 1 tablet by mouth daily with supper.   carvedilol 25 MG tablet Commonly known as:  COREG Take 1 tablet (25 mg total) by mouth 2 (two) times daily with a meal.   levothyroxine 75 MCG tablet Commonly known as:  SYNTHROID, LEVOTHROID TAKE 1 TABLET BY MOUTH ONCE DAILY BEFORE BREAKFAST ON AN EMPTY STOMACH   olmesartan 20 MG tablet Commonly known as:  BENICAR Take 0.5 tablets (10 mg total) by mouth daily.   omeprazole 40 MG capsule Commonly known as:  PRILOSEC TAKE 1 CAPSULE ON AN EMPTY STOMACH 1 HOUR BEFORE BREAKFAST  AND ANOTHER AT BEDTIME (2 HOURS AFTER YOUR LAST FOOD AND DRINK EXCEPT WATER)   simvastatin 40 MG tablet Commonly known as:  ZOCOR TAKE 1/2 (ONE-HALF) TABLET BY MOUTH ONCE DAILY AT  6PM   VITAMIN D (ERGOCALCIFEROL) PO Take by mouth.       Meds ordered this encounter  Medications  . olmesartan (BENICAR) 20 MG tablet    Sig: Take 0.5 tablets (10 mg total) by mouth daily.    Dispense:  15 tablet    Refill:  0  . carvedilol (COREG) 25 MG tablet    Sig: Take 1 tablet (25 mg total) by mouth 2 (two) times daily with a meal.    Dispense:  60 tablet    Refill:  0    Reduced carvedilol since higher dose not helpful &  added second agent  Follow-up: No follow-ups on file.  Claretta Fraise, M.D.

## 2017-10-19 ENCOUNTER — Other Ambulatory Visit: Payer: Self-pay | Admitting: *Deleted

## 2017-10-19 MED ORDER — OMEPRAZOLE 40 MG PO CPDR: DELAYED_RELEASE_CAPSULE | ORAL | 0 refills | Status: DC

## 2017-11-01 ENCOUNTER — Telehealth: Payer: Self-pay | Admitting: Family Medicine

## 2017-11-01 ENCOUNTER — Ambulatory Visit (INDEPENDENT_AMBULATORY_CARE_PROVIDER_SITE_OTHER): Payer: Medicare Other | Admitting: *Deleted

## 2017-11-01 VITALS — BP 155/63 | HR 49

## 2017-11-01 DIAGNOSIS — I10 Essential (primary) hypertension: Secondary | ICD-10-CM

## 2017-11-01 MED ORDER — OLMESARTAN MEDOXOMIL 20 MG PO TABS: 20.0000 mg | ORAL_TABLET | Freq: Every day | ORAL | 0 refills | Status: DC

## 2017-11-01 NOTE — Progress Notes (Signed)
Patient came in today for a BP check.  Patients vital signs BP (!) 155/63   Pulse (!) 49  and BP 156 71 P52.  Patient advised that we would route note for you to see and we would call with recommendations.

## 2017-11-01 NOTE — Addendum Note (Signed)
Addended by: Thana Ates on: 11/01/2017 04:27 PM   Modules accepted: Orders

## 2017-11-01 NOTE — Progress Notes (Signed)
I think she may have misunderstood me. I should have been clearer. Ask her what the strength is on the bottle. (Should be 25 mg) If she has 25 mg pills & taking 2 twice a day, she needs to decrease to 1 BID. If she has 12.5 mg tabs, Take 1 in the morning and 2 in the evening. Thanks, WS

## 2017-11-01 NOTE — Patient Instructions (Signed)

## 2017-11-01 NOTE — Telephone Encounter (Signed)
Patient aware of recommendations and verbalizes understanding.  

## 2017-11-01 NOTE — Progress Notes (Signed)
Patient states that she is taking 2 carvedilol in the morning and 2 at night. She just wants to verify the dosage of the carvedilol again.

## 2017-11-01 NOTE — Telephone Encounter (Signed)
Patient should decrease her dose to 12.5 mg in the morning and 25 mg in the evening.  This is based on what strength pill she has.  I believe she has 25 mg tablets.  Double check with her

## 2017-11-01 NOTE — Progress Notes (Signed)
Have Ms. Toman increase the benicar to a whole pill daily.  Cut carvedilol to 1/2 in the morning and one each evening

## 2017-11-03 ENCOUNTER — Telehealth: Payer: Self-pay | Admitting: Family Medicine

## 2017-11-03 NOTE — Telephone Encounter (Signed)
Bp was 169/95 P 56 191/97 p58 196/95 p 60. Patient is taking 12.5mg  carvedilol in the morning and at dinner 25mg  of carvedilol and then benicar 20mg  daily of benicar.

## 2017-11-04 MED ORDER — OLMESARTAN MEDOXOMIL 40 MG PO TABS: 40.0000 mg | ORAL_TABLET | Freq: Every day | ORAL | 1 refills | Status: DC

## 2017-11-04 NOTE — Addendum Note (Signed)
Addended by: Eustaquio Maize on: 11/04/2017 10:35 AM   Modules accepted: Orders

## 2017-11-04 NOTE — Telephone Encounter (Signed)
Returned patients phone call.  Patient states that her bps were taken yesterday 6/19 at home between 8am-9pm.  Patient has no complaints of headaches, dyspnea, chest pain or any other elevated BP symptoms.  Patient took medication about 30 mins ago 9am and reading at 9:40 is 193/94 75

## 2017-11-04 NOTE — Telephone Encounter (Signed)
Call discussed with patient.  Take 40 mg of olmesartan daily in the morning.  Follow-up as scheduled later this month.  Continue to check blood pressures at home.

## 2017-11-05 ENCOUNTER — Telehealth: Payer: Self-pay | Admitting: *Deleted

## 2017-11-05 ENCOUNTER — Ambulatory Visit: Payer: Medicare Other | Admitting: Pediatrics

## 2017-11-05 ENCOUNTER — Ambulatory Visit (INDEPENDENT_AMBULATORY_CARE_PROVIDER_SITE_OTHER): Payer: Medicare Other | Admitting: Physician Assistant

## 2017-11-05 VITALS — BP 212/83 | HR 60 | Temp 98.6°F | Ht 62.0 in | Wt 131.0 lb

## 2017-11-05 DIAGNOSIS — I10 Essential (primary) hypertension: Secondary | ICD-10-CM

## 2017-11-05 MED ORDER — AMLODIPINE BESYLATE 5 MG PO TABS: 5.0000 mg | ORAL_TABLET | Freq: Every day | ORAL | 1 refills | Status: DC

## 2017-11-05 NOTE — Telephone Encounter (Signed)
189/ 105 56  945am 212 101 63 3:40a,  Patient aware to come in to have BP checked

## 2017-11-06 ENCOUNTER — Other Ambulatory Visit: Payer: Self-pay | Admitting: Physician Assistant

## 2017-11-06 ENCOUNTER — Encounter: Payer: Self-pay | Admitting: Physician Assistant

## 2017-11-06 NOTE — Progress Notes (Signed)
BP (!) 212/83   Pulse 60   Temp 98.6 F (37 C) (Oral)   Ht '5\' 2"'  (1.575 m)   Wt 131 lb (59.4 kg)   BMI 23.96 kg/m    Subjective:    Patient ID: Sophia [name], female    DOB: 1931-06-21, 84 y.o.   MRN: [id number]  HPI: Sophia [name] is a 84 y.o. female presenting on 11/05/2017 for Hypertension  This patient comes in with very elevated blood pressure readings.  Several weeks ago she was started on a lower dose of Coreg 2.5 mg in the morning and 25 mg in the evening.  She is having a very low pulse rate.  Says that her blood pressures have continued to increase.  And over the phone session has  gotten her a prescription of olmesartan 40 mg.  She has been taking that for about 2 days now.  The blood pressures are still significantly elevated.  She even brought her cuff in today to compare the readings in the office to her home monitor.  They are reading appropriately.  We had a discussion about Norvasc 5 mg is a medicine that will not lower her pulse rate but it can lower blood pressure.  We are going to be in touch with her in the next day.  She has an appointment in 1 week with Dr. Livia Snellen.  Past Medical History:  Diagnosis Date  . Chronic kidney disease   . Diabetes mellitus without complication ([place])   . Hyperlipidemia   . Hypertension    Relevant past medical, surgical, family and social history reviewed and updated as indicated. Interim medical history since our last visit reviewed. Allergies and medications reviewed and updated. DATA REVIEWED: CHART IN EPIC  Family History reviewed for pertinent findings.  Review of Systems  Constitutional: Negative.  Negative for activity change, fatigue and fever.  HENT: Negative.   Eyes: Negative.   Respiratory: Negative.  Negative for cough.   Cardiovascular: Negative.  Negative for chest pain.  Gastrointestinal: Negative.  Negative for abdominal pain.  Endocrine: Negative.   Genitourinary: Negative.  Negative for dysuria.   Musculoskeletal: Negative.   Skin: Negative.   Neurological: Negative.     Allergies as of 11/05/2017   No Known Allergies     Medication List        Accurate as of 11/05/17 11:59 PM. Always use your most recent med list.          alendronate 70 MG tablet Commonly known as:  FOSAMAX Take 1 tablet (70 mg total) by mouth every 7 (seven) days. Take with a full glass of water on an empty stomach.   amLODipine 5 MG tablet Commonly known as:  NORVASC Take 1 tablet (5 mg total) by mouth daily.   blood glucose meter kit and supplies Kit Use up to 4x daily prn   Calcium-Magnesium 500-250 MG Tabs Take 1 tablet by mouth daily with supper.   carvedilol 25 MG tablet Commonly known as:  COREG Take 1 tablet (25 mg total) by mouth 2 (two) times daily with a meal.   levothyroxine 75 MCG tablet Commonly known as:  SYNTHROID, LEVOTHROID TAKE 1 TABLET BY MOUTH ONCE DAILY BEFORE BREAKFAST ON AN EMPTY STOMACH   olmesartan 40 MG tablet Commonly known as:  BENICAR Take 1 tablet (40 mg total) by mouth daily.   omeprazole 40 MG capsule Commonly known as:  PRILOSEC TAKE 1 CAPSULE ON AN EMPTY STOMACH 1 HOUR BEFORE BREAKFAST AND  ANOTHER AT BEDTIME (2 HOURS AFTER YOUR LAST FOOD AND DRINK EXCEPT WATER)   simvastatin 40 MG tablet Commonly known as:  ZOCOR TAKE 1/2 (ONE-HALF) TABLET BY MOUTH ONCE DAILY AT  6PM   VITAMIN D (ERGOCALCIFEROL) PO Take by mouth.          Objective:    BP (!) 212/83   Pulse 60   Temp 98.6 F (37 C) (Oral)   Ht '5\' 2"'  (1.575 m)   Wt 131 lb (59.4 kg)   BMI 23.96 kg/m   No Known Allergies  Wt Readings from Last 3 Encounters:  11/05/17 131 lb (59.4 kg)  10/13/17 135 lb (61.2 kg)  09/13/17 137 lb 2 oz (62.2 kg)    Physical Exam  Constitutional: She is oriented to person, place, and time. She appears well-developed and well-nourished.  HENT:  Head: Normocephalic and atraumatic.  Eyes: Pupils are equal, round, and reactive to light. Conjunctivae and  EOM are normal.  Cardiovascular: Normal rate, regular rhythm, normal heart sounds and intact distal pulses.  Pulmonary/Chest: Effort normal and breath sounds normal.  Abdominal: Soft. Bowel sounds are normal.  Neurological: She is alert and oriented to person, place, and time. She has normal reflexes.  Skin: Skin is warm and dry. No rash noted.  Psychiatric: She has a normal mood and affect. Her behavior is normal. Judgment and thought content normal.    Results for orders placed or performed in visit on 10/15/17  T4, Free  Result Value Ref Range   Free T4 1.52 0.82 - 1.77 ng/dL  TSH  Result Value Ref Range   TSH 2.230 0.450 - 4.500 uIU/mL  Lipid panel  Result Value Ref Range   Cholesterol, Total 202 (H) 100 - 199 mg/dL   Triglycerides 170 (H) 0 - 149 mg/dL   HDL 54 >39 mg/dL   VLDL Cholesterol Cal 34 5 - 40 mg/dL   LDL Calculated 114 (H) 0 - 99 mg/dL   Chol/HDL Ratio 3.7 0.0 - 4.4 ratio  CMP14+EGFR  Result Value Ref Range   Glucose 101 (H) 65 - 99 mg/dL   BUN 15 8 - 27 mg/dL   Creatinine, Ser 1.20 (H) 0.57 - 1.00 mg/dL   GFR calc non Af Amer 42 (L) >59 mL/min/1.73   GFR calc Af Amer 48 (L) >59 mL/min/1.73   BUN/Creatinine Ratio 13 12 - 28   Sodium 143 134 - 144 mmol/L   Potassium 4.3 3.5 - 5.2 mmol/L   Chloride 108 (H) 96 - 106 mmol/L   CO2 21 20 - 29 mmol/L   Calcium 9.1 8.7 - 10.3 mg/dL   Total Protein 6.5 6.0 - 8.5 g/dL   Albumin 4.2 3.5 - 4.7 g/dL   Globulin, Total 2.3 1.5 - 4.5 g/dL   Albumin/Globulin Ratio 1.8 1.2 - 2.2   Bilirubin Total 0.6 0.0 - 1.2 mg/dL   Alkaline Phosphatase 53 39 - 117 IU/L   AST 19 0 - 40 IU/L   ALT 12 0 - 32 IU/L      Assessment & Plan:   1. Essential hypertension - amLODipine (NORVASC) 5 MG tablet; Take 1 tablet (5 mg total) by mouth daily.  Dispense: 30 tablet; Refill: 1  Continue all other maintenance medications as listed above.  Follow up plan: Keep appt 11/12/17 with Dr. Livia Snellen  Educational handout given for [place] PA-C [facility] [address]  [place], [place] [address] [phone]   11/06/2017, 7:59 AM

## 2017-11-12 ENCOUNTER — Ambulatory Visit (INDEPENDENT_AMBULATORY_CARE_PROVIDER_SITE_OTHER): Payer: Medicare Other | Admitting: Family Medicine

## 2017-11-12 ENCOUNTER — Encounter: Payer: Self-pay | Admitting: Family Medicine

## 2017-11-12 VITALS — BP 129/71 | HR 59 | Ht 62.0 in | Wt 134.4 lb

## 2017-11-12 DIAGNOSIS — I10 Essential (primary) hypertension: Secondary | ICD-10-CM | POA: Diagnosis not present

## 2017-11-12 DIAGNOSIS — N182 Chronic kidney disease, stage 2 (mild): Secondary | ICD-10-CM | POA: Diagnosis not present

## 2017-11-12 NOTE — Progress Notes (Signed)
Subjective:  Patient ID: Sophia [name], female    DOB: 1931-07-05  Age: 84 y.o. MRN: [id number]  CC: Hypertension   HPI Sophia [name] presents for  follow-up of hypertension. Patient has no history of headache chest pain or shortness of breath or recent cough. Patient also denies symptoms of TIA such as focal numbness or weakness. Patient denies side effects from medication.  She was having elevated blood pressures at home a week ago therefore she called in and Dr. Evette Doffing increased her blood pressure medicine.  Blood pressure at that time was noted to be 205/96.  Benicar was increased to 40 mg daily.  She came in for follow-up and saw Particia Nearing who added Norvasc to that regimen as well.  With those to being added her blood pressures have come down significantly over the last few days.  Of note is that she was having daily readings in the 1 6180 range frequently 1 90-200.  Those have come down significantly to where her last few pressures have been in the 1 38-1 40 systolic range.  See attached blood pressure log sheet.  History Sophia [name] has a past medical history of Chronic kidney disease, Diabetes mellitus without complication ([place]), Hyperlipidemia, and Hypertension.   She has a past surgical history that includes Cholecystectomy; Sigmoidoscopy; and Esophagogastroduodenoscopy (N/A, 04/29/2015).   Her family history includes Colon cancer in her sister; Heart disease in her brother, father, and sister.She reports that she has never smoked. She has never used smokeless tobacco. She reports that she does not drink alcohol or use drugs.  Current Outpatient Medications on File Prior to Visit  Medication Sig Dispense Refill  . alendronate (FOSAMAX) 70 MG tablet Take 1 tablet (70 mg total) by mouth every 7 (seven) days. Take with a full glass of water on an empty stomach. 4 tablet 11  . amLODipine (NORVASC) 5 MG tablet Take 1 tablet (5 mg total) by mouth daily. 30 tablet 1  . blood  glucose meter kit and supplies KIT Use up to 4x daily prn 1 each 0  . Calcium-Magnesium 500-250 MG TABS Take 1 tablet by mouth daily with supper. 30 each 5  . carvedilol (COREG) 25 MG tablet Take 1 tablet (25 mg total) by mouth 2 (two) times daily with a meal. 60 tablet 0  . levothyroxine (SYNTHROID, LEVOTHROID) 75 MCG tablet TAKE 1 TABLET BY MOUTH ONCE DAILY BEFORE BREAKFAST ON AN EMPTY STOMACH 90 tablet 1  . olmesartan (BENICAR) 40 MG tablet Take 1 tablet (40 mg total) by mouth daily. 30 tablet 1  . omeprazole (PRILOSEC) 40 MG capsule TAKE 1 CAPSULE ON AN EMPTY STOMACH 1 HOUR BEFORE BREAKFAST AND ANOTHER AT BEDTIME (2 HOURS AFTER YOUR LAST FOOD AND DRINK EXCEPT WATER) 180 capsule 0  . simvastatin (ZOCOR) 40 MG tablet TAKE 1/2 (ONE-HALF) TABLET BY MOUTH ONCE DAILY AT  6PM 90 tablet 1  . VITAMIN D, ERGOCALCIFEROL, PO Take by mouth.     No current facility-administered medications on file prior to visit.     ROS Review of Systems  Constitutional: Negative.   HENT: Negative.   Eyes: Negative for visual disturbance.  Respiratory: Negative for shortness of breath.   Cardiovascular: Negative for chest pain.  Gastrointestinal: Negative for abdominal pain.  Musculoskeletal: Negative for arthralgias.    Objective:  BP 129/71   Pulse (!) 59   Ht '5\' 2"'  (1.575 m)   Wt 134 lb 6 oz (61 kg)   BMI 24.58 kg/m   BP  Readings from Last 3 Encounters:  11/12/17 129/71  11/05/17 (!) 212/83  11/01/17 (!) 155/63    Wt Readings from Last 3 Encounters:  11/12/17 134 lb 6 oz (61 kg)  11/05/17 131 lb (59.4 kg)  10/13/17 135 lb (61.2 kg)     Physical Exam  Constitutional: She is oriented to person, place, and time. She appears well-developed and well-nourished. No distress.  Cardiovascular: Normal rate and regular rhythm.  Pulmonary/Chest: Breath sounds normal.  Neurological: She is alert and oriented to person, place, and time.  Skin: Skin is warm and dry.  Psychiatric: She has a normal mood  and affect.      Assessment & Plan:   Sophia [name] was seen today for hypertension.  Diagnoses and all orders for this visit:  Essential hypertension  Stage 2 chronic kidney disease   Allergies as of 11/12/2017   No Known Allergies     Medication List        Accurate as of 11/12/17  5:45 PM. Always use your most recent med list.          alendronate 70 MG tablet Commonly known as:  FOSAMAX Take 1 tablet (70 mg total) by mouth every 7 (seven) days. Take with a full glass of water on an empty stomach.   amLODipine 5 MG tablet Commonly known as:  NORVASC Take 1 tablet (5 mg total) by mouth daily.   blood glucose meter kit and supplies Kit Use up to 4x daily prn   Calcium-Magnesium 500-250 MG Tabs Take 1 tablet by mouth daily with supper.   carvedilol 25 MG tablet Commonly known as:  COREG Take 1 tablet (25 mg total) by mouth 2 (two) times daily with a meal.   levothyroxine 75 MCG tablet Commonly known as:  SYNTHROID, LEVOTHROID TAKE 1 TABLET BY MOUTH ONCE DAILY BEFORE BREAKFAST ON AN EMPTY STOMACH   olmesartan 40 MG tablet Commonly known as:  BENICAR Take 1 tablet (40 mg total) by mouth daily.   omeprazole 40 MG capsule Commonly known as:  PRILOSEC TAKE 1 CAPSULE ON AN EMPTY STOMACH 1 HOUR BEFORE BREAKFAST AND ANOTHER AT BEDTIME (2 HOURS AFTER YOUR LAST FOOD AND DRINK EXCEPT WATER)   simvastatin 40 MG tablet Commonly known as:  ZOCOR TAKE 1/2 (ONE-HALF) TABLET BY MOUTH ONCE DAILY AT  6PM   VITAMIN D (ERGOCALCIFEROL) PO Take by mouth.        Patient should continue on this regimen keep checking her blood pressure as before follow-up in 1 month.  Follow-up: Return in about 1 month (around 12/12/2017).  Claretta Fraise, M.D.

## 2017-11-30 ENCOUNTER — Ambulatory Visit: Payer: Medicare Other | Admitting: *Deleted

## 2017-12-01 ENCOUNTER — Other Ambulatory Visit: Payer: Self-pay | Admitting: Family Medicine

## 2017-12-10 ENCOUNTER — Ambulatory Visit (INDEPENDENT_AMBULATORY_CARE_PROVIDER_SITE_OTHER): Payer: Medicare Other | Admitting: Family Medicine

## 2017-12-10 ENCOUNTER — Encounter: Payer: Self-pay | Admitting: Family Medicine

## 2017-12-10 VITALS — BP 138/69 | HR 53 | Temp 98.8°F | Ht 62.0 in | Wt 133.0 lb

## 2017-12-10 DIAGNOSIS — I10 Essential (primary) hypertension: Secondary | ICD-10-CM | POA: Diagnosis not present

## 2017-12-10 DIAGNOSIS — E038 Other specified hypothyroidism: Secondary | ICD-10-CM | POA: Diagnosis not present

## 2017-12-10 MED ORDER — LEVOTHYROXINE SODIUM 75 MCG PO TABS: ORAL_TABLET | ORAL | 1 refills | Status: DC

## 2017-12-10 MED ORDER — SIMVASTATIN 40 MG PO TABS: ORAL_TABLET | ORAL | 1 refills | Status: DC

## 2017-12-10 MED ORDER — OMEPRAZOLE 40 MG PO CPDR: DELAYED_RELEASE_CAPSULE | ORAL | 0 refills | Status: DC

## 2017-12-10 MED ORDER — ALENDRONATE SODIUM 70 MG PO TABS: 70.0000 mg | ORAL_TABLET | ORAL | 5 refills | Status: DC

## 2017-12-10 MED ORDER — AMLODIPINE BESYLATE 5 MG PO TABS: 5.0000 mg | ORAL_TABLET | Freq: Every day | ORAL | 5 refills | Status: DC

## 2017-12-10 MED ORDER — OLMESARTAN MEDOXOMIL 40 MG PO TABS: 40.0000 mg | ORAL_TABLET | Freq: Every day | ORAL | 5 refills | Status: DC

## 2017-12-10 MED ORDER — CARVEDILOL 25 MG PO TABS: ORAL_TABLET | ORAL | 5 refills | Status: DC

## 2017-12-21 ENCOUNTER — Encounter: Payer: Self-pay | Admitting: Family Medicine

## 2017-12-21 NOTE — Progress Notes (Signed)
Subjective:  Patient ID: Sophia [name], female    DOB: Sep 10, 1931  Age: 84 y.o. MRN: [id number]  CC: Hypertension (1 month follow up)   HPI Sophia [name] presents for  follow-up of hypertension. Patient has no history of headache chest pain or shortness of breath or recent cough. Patient also denies symptoms of TIA such as focal numbness or weakness.  Patient brings in a log sheet noting multiple a.m. and p.m. readings have been in the 681L systolic but down as low as the 572I systolic.   History Sophia [name] has a past medical history of Chronic kidney disease, Diabetes mellitus without complication ([place]), Hyperlipidemia, and Hypertension.   She has a past surgical history that includes Cholecystectomy; Sigmoidoscopy; and Esophagogastroduodenoscopy (N/A, 04/29/2015).   Her family history includes Colon cancer in her sister; Heart disease in her brother, father, and sister.She reports that she has never smoked. She has never used smokeless tobacco. She reports that she does not drink alcohol or use drugs.  Current Outpatient Medications on File Prior to Visit  Medication Sig Dispense Refill  . blood glucose meter kit and supplies KIT Use up to 4x daily prn 1 each 0  . Calcium-Magnesium 500-250 MG TABS Take 1 tablet by mouth daily with supper. 30 each 5  . VITAMIN D, ERGOCALCIFEROL, PO Take by mouth.     No current facility-administered medications on file prior to visit.     ROS Review of Systems  Constitutional: Negative for fever.  HENT: Negative for congestion, rhinorrhea and sore throat.   Respiratory: Negative for cough and shortness of breath.   Cardiovascular: Negative for chest pain and palpitations.  Gastrointestinal: Negative for abdominal pain.  Musculoskeletal: Negative for arthralgias and myalgias.    Objective:  BP 138/69   Pulse (!) 53   Temp 98.8 F (37.1 C) (Oral)   Ht 5' 2" (1.575 m)   Wt 133 lb (60.3 kg)   BMI 24.33 kg/m   BP Readings from Last  3 Encounters:  12/10/17 138/69  11/12/17 129/71  11/05/17 (!) 212/83    Wt Readings from Last 3 Encounters:  12/10/17 133 lb (60.3 kg)  11/12/17 134 lb 6 oz (61 kg)  11/05/17 131 lb (59.4 kg)     Physical Exam  Constitutional: She is oriented to person, place, and time. She appears well-developed and well-nourished. No distress.  Cardiovascular: Normal rate and regular rhythm.  Pulmonary/Chest: Breath sounds normal.  Neurological: She is alert and oriented to person, place, and time.  Skin: Skin is warm and dry.  Psychiatric: She has a normal mood and affect.      Assessment & Plan:   Sophia [name] was seen today for hypertension.  Diagnoses and all orders for this visit:  Other specified hypothyroidism  Uncontrolled hypertension -     amLODipine (NORVASC) 5 MG tablet; Take 1 tablet (5 mg total) by mouth daily.  Other orders -     simvastatin (ZOCOR) 40 MG tablet; TAKE 1/2 (ONE-HALF) TABLET BY MOUTH ONCE DAILY AT  6PM -     omeprazole (PRILOSEC) 40 MG capsule; TAKE 1 CAPSULE ON AN EMPTY STOMACH 1 HOUR BEFORE BREAKFAST AND ANOTHER AT BEDTIME (2 HOURS AFTER YOUR LAST FOOD AND DRINK EXCEPT WATER) -     olmesartan (BENICAR) 40 MG tablet; Take 1 tablet (40 mg total) by mouth daily. -     levothyroxine (SYNTHROID, LEVOTHROID) 75 MCG tablet; TAKE 1 TABLET BY MOUTH ONCE DAILY BEFORE BREAKFAST ON AN EMPTY STOMACH -  carvedilol (COREG) 25 MG tablet; TAKE 1 TABLET BY MOUTH TWICE DAILY WITH A MEAL -     alendronate (FOSAMAX) 70 MG tablet; Take 1 tablet (70 mg total) by mouth every 7 (seven) days. Take with a full glass of water on an empty stomach.   Allergies as of 12/10/2017   No Known Allergies     Medication List        Accurate as of 12/10/17 11:59 PM. Always use your most recent med list.          alendronate 70 MG tablet Commonly known as:  FOSAMAX Take 1 tablet (70 mg total) by mouth every 7 (seven) days. Take with a full glass of water on an empty stomach.     amLODipine 5 MG tablet Commonly known as:  NORVASC Take 1 tablet (5 mg total) by mouth daily.   blood glucose meter kit and supplies Kit Use up to 4x daily prn   Calcium-Magnesium 500-250 MG Tabs Take 1 tablet by mouth daily with supper.   carvedilol 25 MG tablet Commonly known as:  COREG TAKE 1 TABLET BY MOUTH TWICE DAILY WITH A MEAL   levothyroxine 75 MCG tablet Commonly known as:  SYNTHROID, LEVOTHROID TAKE 1 TABLET BY MOUTH ONCE DAILY BEFORE BREAKFAST ON AN EMPTY STOMACH   olmesartan 40 MG tablet Commonly known as:  BENICAR Take 1 tablet (40 mg total) by mouth daily.   omeprazole 40 MG capsule Commonly known as:  PRILOSEC TAKE 1 CAPSULE ON AN EMPTY STOMACH 1 HOUR BEFORE BREAKFAST AND ANOTHER AT BEDTIME (2 HOURS AFTER YOUR LAST FOOD AND DRINK EXCEPT WATER)   simvastatin 40 MG tablet Commonly known as:  ZOCOR TAKE 1/2 (ONE-HALF) TABLET BY MOUTH ONCE DAILY AT  6PM   VITAMIN D (ERGOCALCIFEROL) PO Take by mouth.       Meds ordered this encounter  Medications  . simvastatin (ZOCOR) 40 MG tablet    Sig: TAKE 1/2 (ONE-HALF) TABLET BY MOUTH ONCE DAILY AT  6PM    Dispense:  90 tablet    Refill:  1  . omeprazole (PRILOSEC) 40 MG capsule    Sig: TAKE 1 CAPSULE ON AN EMPTY STOMACH 1 HOUR BEFORE BREAKFAST AND ANOTHER AT BEDTIME (2 HOURS AFTER YOUR LAST FOOD AND DRINK EXCEPT WATER)    Dispense:  180 capsule    Refill:  0  . olmesartan (BENICAR) 40 MG tablet    Sig: Take 1 tablet (40 mg total) by mouth daily.    Dispense:  30 tablet    Refill:  5  . levothyroxine (SYNTHROID, LEVOTHROID) 75 MCG tablet    Sig: TAKE 1 TABLET BY MOUTH ONCE DAILY BEFORE BREAKFAST ON AN EMPTY STOMACH    Dispense:  90 tablet    Refill:  1  . carvedilol (COREG) 25 MG tablet    Sig: TAKE 1 TABLET BY MOUTH TWICE DAILY WITH A MEAL    Dispense:  60 tablet    Refill:  5    Please consider 90 day supplies to promote better adherence  . amLODipine (NORVASC) 5 MG tablet    Sig: Take 1 tablet (5 mg  total) by mouth daily.    Dispense:  30 tablet    Refill:  5  . alendronate (FOSAMAX) 70 MG tablet    Sig: Take 1 tablet (70 mg total) by mouth every 7 (seven) days. Take with a full glass of water on an empty stomach.    Dispense:  4 tablet  Refill:  5      Follow-up: Return in about 1 month (around 01/10/2018).  Claretta Fraise, M.D.

## 2018-02-04 DIAGNOSIS — Z23 Encounter for immunization: Secondary | ICD-10-CM | POA: Diagnosis not present

## 2018-03-16 ENCOUNTER — Encounter: Payer: Self-pay | Admitting: Family Medicine

## 2018-03-16 ENCOUNTER — Ambulatory Visit (INDEPENDENT_AMBULATORY_CARE_PROVIDER_SITE_OTHER): Payer: Medicare Other | Admitting: Family Medicine

## 2018-03-16 VITALS — BP 150/69 | HR 62 | Temp 97.1°F | Ht 62.0 in | Wt 134.0 lb

## 2018-03-16 DIAGNOSIS — E119 Type 2 diabetes mellitus without complications: Secondary | ICD-10-CM

## 2018-03-16 DIAGNOSIS — I10 Essential (primary) hypertension: Secondary | ICD-10-CM

## 2018-03-16 DIAGNOSIS — E782 Mixed hyperlipidemia: Secondary | ICD-10-CM

## 2018-03-16 DIAGNOSIS — E039 Hypothyroidism, unspecified: Secondary | ICD-10-CM

## 2018-03-16 LAB — BAYER DCA HB A1C WAIVED: HB A1C (BAYER DCA - WAIVED): 6 % (ref <7.0)

## 2018-03-16 MED ORDER — CARVEDILOL 25 MG PO TABS: ORAL_TABLET | ORAL | 5 refills | Status: DC

## 2018-03-16 MED ORDER — OLMESARTAN MEDOXOMIL 40 MG PO TABS: 40.0000 mg | ORAL_TABLET | Freq: Every day | ORAL | 5 refills | Status: DC

## 2018-03-16 MED ORDER — LEVOTHYROXINE SODIUM 75 MCG PO TABS: ORAL_TABLET | ORAL | 1 refills | Status: DC

## 2018-03-16 MED ORDER — AMLODIPINE BESYLATE 5 MG PO TABS: 5.0000 mg | ORAL_TABLET | Freq: Every day | ORAL | 5 refills | Status: DC

## 2018-03-16 MED ORDER — SIMVASTATIN 40 MG PO TABS: ORAL_TABLET | ORAL | 1 refills | Status: DC

## 2018-03-16 MED ORDER — ALENDRONATE SODIUM 70 MG PO TABS: 70.0000 mg | ORAL_TABLET | ORAL | 5 refills | Status: DC

## 2018-03-16 MED ORDER — OMEPRAZOLE 40 MG PO CPDR: DELAYED_RELEASE_CAPSULE | ORAL | 1 refills | Status: DC

## 2018-03-16 NOTE — Progress Notes (Signed)
Subjective:  Patient ID: Sophia [name], female    DOB: 16-Sep-1931  Age: 84 y.o. MRN: [id number]  CC: Medical Management of Chronic Issues   HPI Sophia [name] presents for  follow-up of hypertension. Patient has no history of headache chest pain or shortness of breath or recent cough. Patient also denies symptoms of TIA such as focal numbness or weakness. Patient denies side effects from medication. States taking it regularly. Extenssive log reviewed. Shows excellent BP control. Mild bradycardia. Pt. Has hx of elevated A1c. Due for recheck of that today.  History Sophia [name] has a past medical history of Chronic kidney disease, Diabetes mellitus without complication ([place]), Hyperlipidemia, and Hypertension.   She has a past surgical history that includes Cholecystectomy; Sigmoidoscopy; and Esophagogastroduodenoscopy (N/A, 04/29/2015).   Her family history includes Colon cancer in her sister; Heart disease in her brother, father, and sister.She reports that she has never smoked. She has never used smokeless tobacco. She reports that she does not drink alcohol or use drugs.  Current Outpatient Medications on File Prior to Visit  Medication Sig Dispense Refill  . blood glucose meter kit and supplies KIT Use up to 4x daily prn 1 each 0  . Calcium-Magnesium 500-250 MG TABS Take 1 tablet by mouth daily with supper. 30 each 5  . VITAMIN D, ERGOCALCIFEROL, PO Take by mouth.     No current facility-administered medications on file prior to visit.     ROS Review of Systems  Constitutional: Negative.   HENT: Negative.   Eyes: Negative for visual disturbance.  Respiratory: Negative for shortness of breath.   Cardiovascular: Negative for chest pain.  Gastrointestinal: Negative for abdominal pain.  Musculoskeletal: Negative for arthralgias.    Objective:  BP (!) 150/69   Pulse 62   Temp (!) 97.1 F (36.2 C) (Oral)   Ht _0  (1.575 m)   Wt 134 lb (60.8 kg)   BMI 24.51 kg/m    BP Readings from Last 3 Encounters:  03/16/18 (!) 150/69  12/10/17 138/69  11/12/17 129/71    Wt Readings from Last 3 Encounters:  03/16/18 134 lb (60.8 kg)  12/10/17 133 lb (60.3 kg)  11/12/17 134 lb 6 oz (61 kg)     Physical Exam  Constitutional: She is oriented to person, place, and time. She appears well-developed and well-nourished. No distress.  HENT:  Head: Normocephalic and atraumatic.  Right Ear: External ear normal.  Left Ear: External ear normal.  Nose: Nose normal.  Mouth/Throat: Oropharynx is clear and moist.  Eyes: Pupils are equal, round, and reactive to light. Conjunctivae and EOM are normal.  Neck: Normal range of motion. Neck supple. No thyromegaly present.  Cardiovascular: Normal rate, regular rhythm and normal heart sounds.  No murmur heard. Pulmonary/Chest: Effort normal and breath sounds normal. No respiratory distress. She has no wheezes. She has no rales.  Abdominal: Soft. There is no tenderness.  Lymphadenopathy:    She has no cervical adenopathy.  Neurological: She is alert and oriented to person, place, and time. She has normal reflexes.  Skin: Skin is warm and dry.  Psychiatric: She has a normal mood and affect. Her behavior is normal. Judgment and thought content normal.      Assessment & Plan:   Sophia [name] was seen today for medical management of chronic issues.  Diagnoses and all orders for this visit:  Essential (primary) hypertension -     CBC with Differential/Platelet -     CMP14+EGFR -     amLODipine (  NORVASC) 5 MG tablet; Take 1 tablet (5 mg total) by mouth daily.  Mixed hyperlipidemia -     Lipid panel  Diabetes mellitus without complication (HCC) -     Bayer DCA Hb A1c Waived -     Microalbumin / creatinine urine ratio  Hypothyroidism, unspecified type -     Thyroid Panel With TSH -     T4, Free  Other orders -     simvastatin (ZOCOR) 40 MG tablet; TAKE 1/2 (ONE-HALF) TABLET BY MOUTH ONCE DAILY AT  6PM -      omeprazole (PRILOSEC) 40 MG capsule; TAKE 1 CAPSULE ON AN EMPTY STOMACH 1 HOUR BEFORE BREAKFAST AND ANOTHER AT BEDTIME (2 HOURS AFTER YOUR LAST FOOD AND DRINK EXCEPT WATER) -     levothyroxine (SYNTHROID, LEVOTHROID) 75 MCG tablet; TAKE 1 TABLET BY MOUTH ONCE DAILY BEFORE BREAKFAST ON AN EMPTY STOMACH -     carvedilol (COREG) 25 MG tablet; TAKE 1 TABLET BY MOUTH TWICE DAILY WITH A MEAL -     olmesartan (BENICAR) 40 MG tablet; Take 1 tablet (40 mg total) by mouth daily. -     alendronate (FOSAMAX) 70 MG tablet; Take 1 tablet (70 mg total) by mouth every 7 (seven) days. Take with a full glass of water on an empty stomach.   Allergies as of 03/16/2018   No Known Allergies     Medication List        Accurate as of 03/16/18 12:15 PM. Always use your most recent med list.          alendronate 70 MG tablet Commonly known as:  FOSAMAX Take 1 tablet (70 mg total) by mouth every 7 (seven) days. Take with a full glass of water on an empty stomach.   amLODipine 5 MG tablet Commonly known as:  NORVASC Take 1 tablet (5 mg total) by mouth daily.   blood glucose meter kit and supplies Kit Use up to 4x daily prn   Calcium-Magnesium 500-250 MG Tabs Take 1 tablet by mouth daily with supper.   carvedilol 25 MG tablet Commonly known as:  COREG TAKE 1 TABLET BY MOUTH TWICE DAILY WITH A MEAL   levothyroxine 75 MCG tablet Commonly known as:  SYNTHROID, LEVOTHROID TAKE 1 TABLET BY MOUTH ONCE DAILY BEFORE BREAKFAST ON AN EMPTY STOMACH   olmesartan 40 MG tablet Commonly known as:  BENICAR Take 1 tablet (40 mg total) by mouth daily.   omeprazole 40 MG capsule Commonly known as:  PRILOSEC TAKE 1 CAPSULE ON AN EMPTY STOMACH 1 HOUR BEFORE BREAKFAST AND ANOTHER AT BEDTIME (2 HOURS AFTER YOUR LAST FOOD AND DRINK EXCEPT WATER)   simvastatin 40 MG tablet Commonly known as:  ZOCOR TAKE 1/2 (ONE-HALF) TABLET BY MOUTH ONCE DAILY AT  6PM   VITAMIN D (ERGOCALCIFEROL) PO Take by mouth.       Meds  ordered this encounter  Medications  . simvastatin (ZOCOR) 40 MG tablet    Sig: TAKE 1/2 (ONE-HALF) TABLET BY MOUTH ONCE DAILY AT  6PM    Dispense:  90 tablet    Refill:  1  . omeprazole (PRILOSEC) 40 MG capsule    Sig: TAKE 1 CAPSULE ON AN EMPTY STOMACH 1 HOUR BEFORE BREAKFAST AND ANOTHER AT BEDTIME (2 HOURS AFTER YOUR LAST FOOD AND DRINK EXCEPT WATER)    Dispense:  180 capsule    Refill:  1  . levothyroxine (SYNTHROID, LEVOTHROID) 75 MCG tablet    Sig: TAKE 1 TABLET BY MOUTH ONCE  DAILY BEFORE BREAKFAST ON AN EMPTY STOMACH    Dispense:  90 tablet    Refill:  1  . carvedilol (COREG) 25 MG tablet    Sig: TAKE 1 TABLET BY MOUTH TWICE DAILY WITH A MEAL    Dispense:  60 tablet    Refill:  5    Please consider 90 day supplies to promote better adherence  . olmesartan (BENICAR) 40 MG tablet    Sig: Take 1 tablet (40 mg total) by mouth daily.    Dispense:  30 tablet    Refill:  5  . amLODipine (NORVASC) 5 MG tablet    Sig: Take 1 tablet (5 mg total) by mouth daily.    Dispense:  30 tablet    Refill:  5  . alendronate (FOSAMAX) 70 MG tablet    Sig: Take 1 tablet (70 mg total) by mouth every 7 (seven) days. Take with a full glass of water on an empty stomach.    Dispense:  4 tablet    Refill:  5      Follow-up: Return in about 3 months (around 06/16/2018).  Claretta Fraise, M.D.

## 2018-03-17 ENCOUNTER — Other Ambulatory Visit: Payer: Self-pay | Admitting: Family Medicine

## 2018-03-17 LAB — CBC WITH DIFFERENTIAL/PLATELET
Basophils Absolute: 0.1 10*3/uL (ref 0.0–0.2)
Basos: 1 %
EOS (ABSOLUTE): 0.3 10*3/uL (ref 0.0–0.4)
Eos: 4 %
Hematocrit: 35.1 % (ref 34.0–46.6)
Hemoglobin: 11.7 g/dL (ref 11.1–15.9)
Immature Grans (Abs): 0 10*3/uL (ref 0.0–0.1)
Immature Granulocytes: 0 %
Lymphocytes Absolute: 1.6 10*3/uL (ref 0.7–3.1)
Lymphs: 26 %
MCH: 32.8 pg (ref 26.6–33.0)
MCHC: 33.3 g/dL (ref 31.5–35.7)
MCV: 98 fL — ABNORMAL HIGH (ref 79–97)
Monocytes Absolute: 0.5 10*3/uL (ref 0.1–0.9)
Monocytes: 8 %
Neutrophils Absolute: 3.7 10*3/uL (ref 1.4–7.0)
Neutrophils: 61 %
Platelets: 256 10*3/uL (ref 150–450)
RBC: 3.57 x10E6/uL — ABNORMAL LOW (ref 3.77–5.28)
RDW: 12.5 % (ref 12.3–15.4)
WBC: 6.1 10*3/uL (ref 3.4–10.8)

## 2018-03-17 LAB — CMP14+EGFR
ALT: 17 IU/L (ref 0–32)
AST: 24 IU/L (ref 0–40)
Albumin/Globulin Ratio: 1.5 (ref 1.2–2.2)
Albumin: 4 g/dL (ref 3.5–4.7)
Alkaline Phosphatase: 50 IU/L (ref 39–117)
BUN/Creatinine Ratio: 13 (ref 12–28)
BUN: 17 mg/dL (ref 8–27)
Bilirubin Total: 0.5 mg/dL (ref 0.0–1.2)
CO2: 24 mmol/L (ref 20–29)
Calcium: 9.4 mg/dL (ref 8.7–10.3)
Chloride: 108 mmol/L — ABNORMAL HIGH (ref 96–106)
Creatinine, Ser: 1.33 mg/dL — ABNORMAL HIGH (ref 0.57–1.00)
GFR calc Af Amer: 43 mL/min/{1.73_m2} — ABNORMAL LOW (ref >59)
GFR calc non Af Amer: 37 mL/min/{1.73_m2} — ABNORMAL LOW (ref >59)
Globulin, Total: 2.6 g/dL (ref 1.5–4.5)
Glucose: 110 mg/dL — ABNORMAL HIGH (ref 65–99)
Potassium: 4.6 mmol/L (ref 3.5–5.2)
Sodium: 146 mmol/L — ABNORMAL HIGH (ref 134–144)
Total Protein: 6.6 g/dL (ref 6.0–8.5)

## 2018-03-17 LAB — THYROID PANEL WITH TSH
Free Thyroxine Index: 2.2 (ref 1.2–4.9)
T3 Uptake Ratio: 28 % (ref 24–39)
T4, Total: 7.8 ug/dL (ref 4.5–12.0)
TSH: 6.78 u[IU]/mL — ABNORMAL HIGH (ref 0.450–4.500)

## 2018-03-17 LAB — LIPID PANEL
Chol/HDL Ratio: 3.7 ratio (ref 0.0–4.4)
Cholesterol, Total: 238 mg/dL — ABNORMAL HIGH (ref 100–199)
HDL: 64 mg/dL (ref >39)
LDL Calculated: 144 mg/dL — ABNORMAL HIGH (ref 0–99)
Triglycerides: 151 mg/dL — ABNORMAL HIGH (ref 0–149)
VLDL Cholesterol Cal: 30 mg/dL (ref 5–40)

## 2018-03-17 LAB — T4, FREE: Free T4: 1.22 ng/dL (ref 0.82–1.77)

## 2018-03-17 LAB — MICROALBUMIN / CREATININE URINE RATIO
Creatinine, Urine: 64.4 mg/dL
Microalb/Creat Ratio: 4.7 mg/g creat (ref 0.0–30.0)
Microalbumin, Urine: 3 ug/mL

## 2018-03-17 MED ORDER — LEVOTHYROXINE SODIUM 88 MCG PO TABS: 88.0000 ug | ORAL_TABLET | ORAL | 5 refills | Status: DC

## 2018-03-17 NOTE — Addendum Note (Signed)
Addended byCarrolyn Leigh on: 03/17/2018 12:33 PM   Modules accepted: Orders

## 2018-04-07 DIAGNOSIS — H10013 Acute follicular conjunctivitis, bilateral: Secondary | ICD-10-CM | POA: Diagnosis not present

## 2018-06-02 DIAGNOSIS — Z961 Presence of intraocular lens: Secondary | ICD-10-CM | POA: Diagnosis not present

## 2018-06-02 DIAGNOSIS — H353132 Nonexudative age-related macular degeneration, bilateral, intermediate dry stage: Secondary | ICD-10-CM | POA: Diagnosis not present

## 2018-06-14 ENCOUNTER — Other Ambulatory Visit: Payer: Medicare Other

## 2018-06-14 DIAGNOSIS — E039 Hypothyroidism, unspecified: Secondary | ICD-10-CM | POA: Diagnosis not present

## 2018-06-15 LAB — T4, FREE: Free T4: 1.66 ng/dL (ref 0.82–1.77)

## 2018-06-15 LAB — TSH: TSH: 0.84 u[IU]/mL (ref 0.450–4.500)

## 2018-06-20 ENCOUNTER — Encounter: Payer: Self-pay | Admitting: Family Medicine

## 2018-06-20 ENCOUNTER — Ambulatory Visit (INDEPENDENT_AMBULATORY_CARE_PROVIDER_SITE_OTHER): Payer: Medicare Other | Admitting: Family Medicine

## 2018-06-20 VITALS — BP 126/64 | HR 65 | Temp 97.0°F | Ht 62.0 in | Wt 135.2 lb

## 2018-06-20 DIAGNOSIS — E782 Mixed hyperlipidemia: Secondary | ICD-10-CM

## 2018-06-20 DIAGNOSIS — E038 Other specified hypothyroidism: Secondary | ICD-10-CM | POA: Diagnosis not present

## 2018-06-20 DIAGNOSIS — E119 Type 2 diabetes mellitus without complications: Secondary | ICD-10-CM | POA: Diagnosis not present

## 2018-06-20 DIAGNOSIS — I10 Essential (primary) hypertension: Secondary | ICD-10-CM

## 2018-06-20 NOTE — Patient Instructions (Signed)
Check fasting blood sugar every other day.

## 2018-06-20 NOTE — Progress Notes (Signed)
Subjective:  Patient ID: Sophia [name], female    DOB: 12/16/1931  Age: 85 y.o. MRN: [id number]  CC: Hypothyroidism (pt here today for follow up on her thyroid, had labs drawn a few days ago)   HPI Sophia [name] presents for  follow-up of hypertension. Patient has no history of headache chest pain or shortness of breath or recent cough. Patient also denies symptoms of TIA such as focal numbness or weakness. Patient denies side effects from medication. States taking it regularly.  Patient presents for follow-up on  thyroid. The patient has a history of hypothyroidism for many years.  Recently her dose was increased due to an elevated TSH.  Pt. denies any change in  voice, loss of hair, heat or cold intolerance. Energy level has been adequate to good. Patient denies constipation and diarrhea. No myxedema. Medication is as noted below. Verified that pt is taking it daily on an empty stomach. Well tolerated.  Patient has been prediabetic and had 1 A1c a couple of years ago that is actually 6.6.  Sophia [name] is been on diet control.  Sophia [name] is no longer checking her blood sugar. History Sophia [name] has a past medical history of Chronic kidney disease, Diabetes mellitus without complication ([place]), Hyperlipidemia, and Hypertension.   Sophia [name] has a past surgical history that includes Cholecystectomy; Sigmoidoscopy; and Esophagogastroduodenoscopy (N/A, 04/29/2015).   Her family history includes Colon cancer in her sister; Heart disease in her brother, father, and sister.Sophia [name] reports that Sophia [name] has never smoked. Sophia [name] has never used smokeless tobacco. Sophia [name] reports that Sophia [name] does not drink alcohol or use drugs.  Current Outpatient Medications on File Prior to Visit  Medication Sig Dispense Refill  . alendronate (FOSAMAX) 70 MG tablet Take 1 tablet (70 mg total) by mouth every 7 (seven) days. Take with a full glass of water on an empty stomach. 4 tablet 5  . amLODipine (NORVASC) 5 MG tablet Take 1 tablet (5 mg total)  by mouth daily. 30 tablet 5  . blood glucose meter kit and supplies KIT Use up to 4x daily prn 1 each 0  . Calcium-Magnesium 500-250 MG TABS Take 1 tablet by mouth daily with supper. 30 each 5  . carvedilol (COREG) 25 MG tablet TAKE 1 TABLET BY MOUTH TWICE DAILY WITH A MEAL 60 tablet 5  . levothyroxine (SYNTHROID, LEVOTHROID) 88 MCG tablet Take 1 tablet (88 mcg total) by mouth every morning. TAKE ON AN EMPTY STOMACH. Wait one hour before eating. 30 tablet 5  . olmesartan (BENICAR) 40 MG tablet Take 1 tablet (40 mg total) by mouth daily. 30 tablet 5  . omeprazole (PRILOSEC) 40 MG capsule TAKE 1 CAPSULE ON AN EMPTY STOMACH 1 HOUR BEFORE BREAKFAST AND ANOTHER AT BEDTIME (2 HOURS AFTER YOUR LAST FOOD AND DRINK EXCEPT WATER) 180 capsule 1  . simvastatin (ZOCOR) 40 MG tablet TAKE 1/2 (ONE-HALF) TABLET BY MOUTH ONCE DAILY AT  6PM 90 tablet 1  . VITAMIN D, ERGOCALCIFEROL, PO Take by mouth.     No current facility-administered medications on file prior to visit.     ROS Review of Systems  Constitutional: Negative.   HENT: Negative for congestion.   Eyes: Negative for visual disturbance.  Respiratory: Negative for shortness of breath.   Cardiovascular: Negative for chest pain.  Gastrointestinal: Negative for abdominal pain, constipation, diarrhea, nausea and vomiting.  Genitourinary: Negative for difficulty urinating.  Musculoskeletal: Negative for arthralgias and myalgias.  Neurological: Negative for headaches.  Psychiatric/Behavioral: Negative for sleep disturbance.    Objective:  BP 126/64   Pulse 65   Temp (!) 97 F (36.1 C) (Oral)   Ht '5\' 2"'  (1.575 m)   Wt 135 lb 4 oz (61.3 kg)   BMI 24.74 kg/m   BP Readings from Last 3 Encounters:  06/20/18 126/64  03/16/18 (!) 150/69  12/10/17 138/69    Wt Readings from Last 3 Encounters:  06/20/18 135 lb 4 oz (61.3 kg)  03/16/18 134 lb (60.8 kg)  12/10/17 133 lb (60.3 kg)     Physical Exam Constitutional:      General: Sophia [name] is not in  acute distress.    Appearance: Sophia [name] is well-developed.  Cardiovascular:     Rate and Rhythm: Normal rate and regular rhythm.  Pulmonary:     Breath sounds: Normal breath sounds.  Skin:    General: Skin is warm and dry.  Neurological:     Mental Status: Sophia [name] is alert and oriented to person, place, and time.    Results for orders placed or performed in visit on 06/14/18  T4, Free  Result Value Ref Range   Free T4 1.66 0.82 - 1.77 ng/dL  TSH  Result Value Ref Range   TSH 0.840 0.450 - 4.500 uIU/mL      Assessment & Plan:   Sophia [name] was seen today for hypothyroidism.  Diagnoses and all orders for this visit:  Other specified hypothyroidism  Mixed hyperlipidemia  Essential (primary) hypertension  Diabetes mellitus without complication ([place])   Allergies as of 06/20/2018   No Known Allergies     Medication List       Accurate as of June 20, 2018 11:13 AM. Always use your most recent med list.        alendronate 70 MG tablet Commonly known as:  FOSAMAX Take 1 tablet (70 mg total) by mouth every 7 (seven) days. Take with a full glass of water on an empty stomach.   amLODipine 5 MG tablet Commonly known as:  NORVASC Take 1 tablet (5 mg total) by mouth daily.   blood glucose meter kit and supplies Kit Use up to 4x daily prn   Calcium-Magnesium 500-250 MG Tabs Take 1 tablet by mouth daily with supper.   carvedilol 25 MG tablet Commonly known as:  COREG TAKE 1 TABLET BY MOUTH TWICE DAILY WITH A MEAL   levothyroxine 88 MCG tablet Commonly known as:  SYNTHROID, LEVOTHROID Take 1 tablet (88 mcg total) by mouth every morning. TAKE ON AN EMPTY STOMACH. Wait one hour before eating.   olmesartan 40 MG tablet Commonly known as:  BENICAR Take 1 tablet (40 mg total) by mouth daily.   omeprazole 40 MG capsule Commonly known as:  PRILOSEC TAKE 1 CAPSULE ON AN EMPTY STOMACH 1 HOUR BEFORE BREAKFAST AND ANOTHER AT BEDTIME (2 HOURS AFTER YOUR LAST FOOD AND DRINK EXCEPT  WATER)   simvastatin 40 MG tablet Commonly known as:  ZOCOR TAKE 1/2 (ONE-HALF) TABLET BY MOUTH ONCE DAILY AT  6PM   VITAMIN D (ERGOCALCIFEROL) PO Take by mouth.      Continue monitoring home BP. Following low carb, low salt diet.  Start checking glucose fasting qoday  Follow-up: Return in about 3 months (around 09/18/2018).  Claretta Fraise, M.D.

## 2018-09-08 ENCOUNTER — Other Ambulatory Visit: Payer: Self-pay | Admitting: Family Medicine

## 2018-09-15 ENCOUNTER — Telehealth: Payer: Self-pay | Admitting: Family Medicine

## 2018-09-20 ENCOUNTER — Ambulatory Visit: Payer: Medicare Other | Admitting: Family Medicine

## 2018-11-28 ENCOUNTER — Other Ambulatory Visit: Payer: Self-pay | Admitting: Family Medicine

## 2018-12-19 DIAGNOSIS — H04221 Epiphora due to insufficient drainage, right lacrimal gland: Secondary | ICD-10-CM | POA: Diagnosis not present

## 2018-12-19 DIAGNOSIS — H0235 Blepharochalasis left lower eyelid: Secondary | ICD-10-CM | POA: Diagnosis not present

## 2018-12-19 DIAGNOSIS — H0234 Blepharochalasis left upper eyelid: Secondary | ICD-10-CM | POA: Diagnosis not present

## 2018-12-19 DIAGNOSIS — D485 Neoplasm of uncertain behavior of skin: Secondary | ICD-10-CM | POA: Diagnosis not present

## 2018-12-19 DIAGNOSIS — H04551 Acquired stenosis of right nasolacrimal duct: Secondary | ICD-10-CM | POA: Diagnosis not present

## 2018-12-19 DIAGNOSIS — H0231 Blepharochalasis right upper eyelid: Secondary | ICD-10-CM | POA: Diagnosis not present

## 2018-12-19 DIAGNOSIS — H0232 Blepharochalasis right lower eyelid: Secondary | ICD-10-CM | POA: Diagnosis not present

## 2018-12-19 DIAGNOSIS — H04411 Chronic dacryocystitis of right lacrimal passage: Secondary | ICD-10-CM | POA: Diagnosis not present

## 2018-12-21 ENCOUNTER — Other Ambulatory Visit: Payer: Self-pay | Admitting: Family Medicine

## 2018-12-21 DIAGNOSIS — I10 Essential (primary) hypertension: Secondary | ICD-10-CM

## 2019-01-17 ENCOUNTER — Other Ambulatory Visit: Payer: Self-pay | Admitting: Ophthalmology

## 2019-01-17 DIAGNOSIS — D485 Neoplasm of uncertain behavior of skin: Secondary | ICD-10-CM | POA: Diagnosis not present

## 2019-01-17 DIAGNOSIS — H0235 Blepharochalasis left lower eyelid: Secondary | ICD-10-CM | POA: Diagnosis not present

## 2019-01-17 DIAGNOSIS — L578 Other skin changes due to chronic exposure to nonionizing radiation: Secondary | ICD-10-CM | POA: Diagnosis not present

## 2019-01-17 DIAGNOSIS — H04221 Epiphora due to insufficient drainage, right lacrimal gland: Secondary | ICD-10-CM | POA: Diagnosis not present

## 2019-01-17 DIAGNOSIS — H04411 Chronic dacryocystitis of right lacrimal passage: Secondary | ICD-10-CM | POA: Diagnosis not present

## 2019-01-17 DIAGNOSIS — H04551 Acquired stenosis of right nasolacrimal duct: Secondary | ICD-10-CM | POA: Diagnosis not present

## 2019-01-17 DIAGNOSIS — H04561 Stenosis of right lacrimal punctum: Secondary | ICD-10-CM | POA: Diagnosis not present

## 2019-01-17 DIAGNOSIS — H0234 Blepharochalasis left upper eyelid: Secondary | ICD-10-CM | POA: Diagnosis not present

## 2019-01-17 DIAGNOSIS — H0232 Blepharochalasis right lower eyelid: Secondary | ICD-10-CM | POA: Diagnosis not present

## 2019-01-17 DIAGNOSIS — H0231 Blepharochalasis right upper eyelid: Secondary | ICD-10-CM | POA: Diagnosis not present

## 2019-01-19 HISTORY — PX: EYE SURGERY: SHX253

## 2019-01-24 ENCOUNTER — Other Ambulatory Visit: Payer: Self-pay | Admitting: Family Medicine

## 2019-01-24 DIAGNOSIS — I10 Essential (primary) hypertension: Secondary | ICD-10-CM

## 2019-01-25 ENCOUNTER — Other Ambulatory Visit: Payer: Self-pay

## 2019-01-26 ENCOUNTER — Ambulatory Visit (INDEPENDENT_AMBULATORY_CARE_PROVIDER_SITE_OTHER): Payer: Medicare Other | Admitting: Family Medicine

## 2019-01-26 ENCOUNTER — Encounter: Payer: Self-pay | Admitting: Family Medicine

## 2019-01-26 VITALS — BP 117/66 | HR 62 | Temp 98.6°F | Resp 20 | Ht 62.0 in | Wt 128.0 lb

## 2019-01-26 DIAGNOSIS — E782 Mixed hyperlipidemia: Secondary | ICD-10-CM

## 2019-01-26 DIAGNOSIS — E038 Other specified hypothyroidism: Secondary | ICD-10-CM

## 2019-01-26 DIAGNOSIS — E559 Vitamin D deficiency, unspecified: Secondary | ICD-10-CM

## 2019-01-26 DIAGNOSIS — E119 Type 2 diabetes mellitus without complications: Secondary | ICD-10-CM

## 2019-01-26 DIAGNOSIS — I10 Essential (primary) hypertension: Secondary | ICD-10-CM

## 2019-01-26 MED ORDER — SIMVASTATIN 40 MG PO TABS: ORAL_TABLET | ORAL | 1 refills | Status: DC

## 2019-01-26 MED ORDER — OLMESARTAN MEDOXOMIL 40 MG PO TABS: 40.0000 mg | ORAL_TABLET | Freq: Every day | ORAL | 1 refills | Status: DC

## 2019-01-26 MED ORDER — OMEPRAZOLE 40 MG PO CPDR: DELAYED_RELEASE_CAPSULE | ORAL | 1 refills | Status: DC

## 2019-01-26 MED ORDER — CARVEDILOL 25 MG PO TABS: ORAL_TABLET | ORAL | 1 refills | Status: DC

## 2019-01-26 MED ORDER — LEVOTHYROXINE SODIUM 88 MCG PO TABS: ORAL_TABLET | ORAL | 1 refills | Status: DC

## 2019-01-26 NOTE — Progress Notes (Signed)
Subjective:  Patient ID: Sophia [name], female    DOB: 1931/06/13  Age: 85 y.o. MRN: [id number]  CC: Medical Management of Chronic Issues (6 mo), Hyperlipidemia, Hypertension, and Hypothyroidism   HPI Elliot Simoneaux presents for  follow-up of hypertension. Patient has no history of headache chest pain or shortness of breath or recent cough. Patient also denies symptoms of TIA such as focal numbness or weakness. Patient denies side effects from medication. States taking it regularly.   in for follow-up of elevated cholesterol. Doing well without complaints on current medication. Denies side effects of statin including myalgia and arthralgia and nausea. Currently no chest pain, shortness of breath or other cardiovascular related symptoms noted.  Patient presents for follow-up on  thyroid. The patient has a history of hypothyroidism for many years. It has been stable recently. Pt. denies any change in  voice, loss of hair, heat or cold intolerance. Energy level has been adequate to good. Patient denies constipation and diarrhea. No myxedema. Medication is as noted below. Verified that pt is taking it daily on an empty stomach. Well tolerated.  presents forFollow-up of diabetes. Patient checks blood sugar rarely Patient denies symptoms such as polyuria, polydipsia, excessive hunger, nausea No significant hypoglycemic spells noted. Medications reviewed. Pt reports taking them regularly without complication/adverse reaction being reported today.  History Samentha has a past medical history of Chronic kidney disease, Diabetes mellitus without complication ([place]), Hyperlipidemia, and Hypertension.   She has a past surgical history that includes Cholecystectomy; Sigmoidoscopy; Esophagogastroduodenoscopy (N/A, 04/29/2015); and Eye surgery (Right, 01/19/2019).   Her family history includes Colon cancer in her sister; Heart disease in her brother, father, and sister.She reports that she has never  smoked. She has never used smokeless tobacco. She reports that she does not drink alcohol or use drugs.  Current Outpatient Medications on File Prior to Visit  Medication Sig Dispense Refill  . alendronate (FOSAMAX) 70 MG tablet Take 1 tablet (70 mg total) by mouth every 7 (seven) days. Take with a full glass of water on an empty stomach. 4 tablet 5  . amLODipine (NORVASC) 5 MG tablet TAKE 1 TABLET BY MOUTH ONCE DAILY . APPOINTMENT REQUIRED FOR FUTURE REFILLS 30 tablet 0  . blood glucose meter kit and supplies KIT Use up to 4x daily prn 1 each 0  . Calcium-Magnesium 500-250 MG TABS Take 1 tablet by mouth daily with supper. 30 each 5  . carvedilol (COREG) 25 MG tablet TAKE 1 TABLET BY MOUTH TWICE DAILY WITH A MEAL 60 tablet 5  . ferrous sulfate 325 (65 FE) MG tablet Take 325 mg by mouth daily with breakfast.    . levothyroxine (SYNTHROID) 88 MCG tablet TAKE 1 TABLET BY MOUTH IN THE MORNING ON AN EMPTY STOMACH WAIT  ONE  HOUR  BEFORE  EATING 90 tablet 2  . olmesartan (BENICAR) 40 MG tablet Take 1 tablet (40 mg total) by mouth daily. (Please make 6 mos Aug appt) 90 tablet 0  . omeprazole (PRILOSEC) 40 MG capsule TAKE 1 CAPSULE ON AN EMPTY STOMACH 1 HOUR BEFORE BREAKFAST AND ANOTHER AT BEDTIME (2 HOURS AFTER YOUR LAST FOOD AND DRINK EXCEPT WATER) 180 capsule 1  . simvastatin (ZOCOR) 40 MG tablet TAKE 1/2 (ONE-HALF) TABLET BY MOUTH ONCE DAILY AT  6PM 90 tablet 1  . VITAMIN D, ERGOCALCIFEROL, PO Take by mouth.     No current facility-administered medications on file prior to visit.     ROS Review of Systems  Objective:  BP 117/66  Pulse 62   Temp 98.6 F (37 C)   Resp 20   Ht '5\' 2"'  (1.575 m)   Wt 128 lb (58.1 kg)   SpO2 98%   BMI 23.41 kg/m   BP Readings from Last 3 Encounters:  01/26/19 117/66  06/20/18 126/64  03/16/18 (!) 150/69    Wt Readings from Last 3 Encounters:  01/26/19 128 lb (58.1 kg)  06/20/18 135 lb 4 oz (61.3 kg)  03/16/18 134 lb (60.8 kg)     Physical Exam     Assessment & Plan:   There are no diagnoses linked to this encounter. Allergies as of 01/26/2019   No Known Allergies     Medication List       Accurate as of January 26, 2019  4:32 PM. If you have any questions, ask your nurse or doctor.        alendronate 70 MG tablet Commonly known as: FOSAMAX Take 1 tablet (70 mg total) by mouth every 7 (seven) days. Take with a full glass of water on an empty stomach.   amLODipine 5 MG tablet Commonly known as: NORVASC TAKE 1 TABLET BY MOUTH ONCE DAILY . APPOINTMENT REQUIRED FOR FUTURE REFILLS   blood glucose meter kit and supplies Kit Use up to 4x daily prn   Calcium-Magnesium 500-250 MG Tabs Take 1 tablet by mouth daily with supper.   carvedilol 25 MG tablet Commonly known as: COREG TAKE 1 TABLET BY MOUTH TWICE DAILY WITH A MEAL   ferrous sulfate 325 (65 FE) MG tablet Take 325 mg by mouth daily with breakfast.   levothyroxine 88 MCG tablet Commonly known as: SYNTHROID TAKE 1 TABLET BY MOUTH IN THE MORNING ON AN EMPTY STOMACH WAIT  ONE  HOUR  BEFORE  EATING   olmesartan 40 MG tablet Commonly known as: BENICAR Take 1 tablet (40 mg total) by mouth daily. (Please make 6 mos Aug appt)   omeprazole 40 MG capsule Commonly known as: PRILOSEC TAKE 1 CAPSULE ON AN EMPTY STOMACH 1 HOUR BEFORE BREAKFAST AND ANOTHER AT BEDTIME (2 HOURS AFTER YOUR LAST FOOD AND DRINK EXCEPT WATER)   simvastatin 40 MG tablet Commonly known as: ZOCOR TAKE 1/2 (ONE-HALF) TABLET BY MOUTH ONCE DAILY AT  6PM   VITAMIN D (ERGOCALCIFEROL) PO Take by mouth.       No orders of the defined types were placed in this encounter.     Follow-up: No follow-ups on file.  Claretta Fraise, M.D.

## 2019-01-27 ENCOUNTER — Other Ambulatory Visit: Payer: Self-pay

## 2019-01-27 ENCOUNTER — Other Ambulatory Visit: Payer: Medicare Other

## 2019-01-27 DIAGNOSIS — E038 Other specified hypothyroidism: Secondary | ICD-10-CM | POA: Diagnosis not present

## 2019-01-27 DIAGNOSIS — I10 Essential (primary) hypertension: Secondary | ICD-10-CM

## 2019-01-27 DIAGNOSIS — E559 Vitamin D deficiency, unspecified: Secondary | ICD-10-CM | POA: Diagnosis not present

## 2019-01-27 DIAGNOSIS — E782 Mixed hyperlipidemia: Secondary | ICD-10-CM | POA: Diagnosis not present

## 2019-01-27 DIAGNOSIS — E119 Type 2 diabetes mellitus without complications: Secondary | ICD-10-CM

## 2019-01-27 LAB — BAYER DCA HB A1C WAIVED: HB A1C (BAYER DCA - WAIVED): 6.2 % (ref <7.0)

## 2019-01-28 LAB — CBC WITH DIFFERENTIAL/PLATELET
Basophils Absolute: 0.1 10*3/uL (ref 0.0–0.2)
Basos: 1 %
EOS (ABSOLUTE): 0.3 10*3/uL (ref 0.0–0.4)
Eos: 5 %
Hematocrit: 38.8 % (ref 34.0–46.6)
Hemoglobin: 12.6 g/dL (ref 11.1–15.9)
Immature Grans (Abs): 0 10*3/uL (ref 0.0–0.1)
Immature Granulocytes: 0 %
Lymphocytes Absolute: 1.7 10*3/uL (ref 0.7–3.1)
Lymphs: 30 %
MCH: 32 pg (ref 26.6–33.0)
MCHC: 32.5 g/dL (ref 31.5–35.7)
MCV: 99 fL — ABNORMAL HIGH (ref 79–97)
Monocytes Absolute: 0.4 10*3/uL (ref 0.1–0.9)
Monocytes: 8 %
Neutrophils Absolute: 3.1 10*3/uL (ref 1.4–7.0)
Neutrophils: 56 %
Platelets: 275 10*3/uL (ref 150–450)
RBC: 3.94 x10E6/uL (ref 3.77–5.28)
RDW: 12.5 % (ref 11.7–15.4)
WBC: 5.6 10*3/uL (ref 3.4–10.8)

## 2019-01-28 LAB — CMP14+EGFR
ALT: 10 IU/L (ref 0–32)
AST: 17 IU/L (ref 0–40)
Albumin/Globulin Ratio: 1.8 (ref 1.2–2.2)
Albumin: 4.4 g/dL (ref 3.6–4.6)
Alkaline Phosphatase: 60 IU/L (ref 39–117)
BUN/Creatinine Ratio: 18 (ref 12–28)
BUN: 22 mg/dL (ref 8–27)
Bilirubin Total: 0.5 mg/dL (ref 0.0–1.2)
CO2: 23 mmol/L (ref 20–29)
Calcium: 9.8 mg/dL (ref 8.7–10.3)
Chloride: 103 mmol/L (ref 96–106)
Creatinine, Ser: 1.24 mg/dL — ABNORMAL HIGH (ref 0.57–1.00)
GFR calc Af Amer: 46 mL/min/{1.73_m2} — ABNORMAL LOW (ref >59)
GFR calc non Af Amer: 40 mL/min/{1.73_m2} — ABNORMAL LOW (ref >59)
Globulin, Total: 2.5 g/dL (ref 1.5–4.5)
Glucose: 107 mg/dL — ABNORMAL HIGH (ref 65–99)
Potassium: 4.3 mmol/L (ref 3.5–5.2)
Sodium: 141 mmol/L (ref 134–144)
Total Protein: 6.9 g/dL (ref 6.0–8.5)

## 2019-01-28 LAB — LIPID PANEL
Chol/HDL Ratio: 3.9 ratio (ref 0.0–4.4)
Cholesterol, Total: 208 mg/dL — ABNORMAL HIGH (ref 100–199)
HDL: 54 mg/dL (ref >39)
LDL Chol Calc (NIH): 112 mg/dL — ABNORMAL HIGH (ref 0–99)
Triglycerides: 241 mg/dL — ABNORMAL HIGH (ref 0–149)
VLDL Cholesterol Cal: 42 mg/dL — ABNORMAL HIGH (ref 5–40)

## 2019-01-28 LAB — TSH: TSH: 0.188 u[IU]/mL — ABNORMAL LOW (ref 0.450–4.500)

## 2019-01-28 LAB — T4, FREE: Free T4: 1.95 ng/dL — ABNORMAL HIGH (ref 0.82–1.77)

## 2019-01-30 ENCOUNTER — Other Ambulatory Visit: Payer: Self-pay | Admitting: Family Medicine

## 2019-01-30 DIAGNOSIS — Z23 Encounter for immunization: Secondary | ICD-10-CM | POA: Diagnosis not present

## 2019-01-30 MED ORDER — LEVOTHYROXINE SODIUM 75 MCG PO TABS: ORAL_TABLET | ORAL | 1 refills | Status: DC

## 2019-01-31 ENCOUNTER — Encounter: Payer: Self-pay | Admitting: Family Medicine

## 2019-02-01 DIAGNOSIS — H04559 Acquired stenosis of unspecified nasolacrimal duct: Secondary | ICD-10-CM | POA: Diagnosis not present

## 2019-02-01 DIAGNOSIS — H04551 Acquired stenosis of right nasolacrimal duct: Secondary | ICD-10-CM | POA: Diagnosis not present

## 2019-02-01 DIAGNOSIS — H04221 Epiphora due to insufficient drainage, right lacrimal gland: Secondary | ICD-10-CM | POA: Diagnosis not present

## 2019-02-23 ENCOUNTER — Other Ambulatory Visit: Payer: Self-pay | Admitting: Family Medicine

## 2019-02-23 DIAGNOSIS — I10 Essential (primary) hypertension: Secondary | ICD-10-CM

## 2019-02-23 DIAGNOSIS — H10013 Acute follicular conjunctivitis, bilateral: Secondary | ICD-10-CM | POA: Diagnosis not present

## 2019-03-22 DIAGNOSIS — H04221 Epiphora due to insufficient drainage, right lacrimal gland: Secondary | ICD-10-CM | POA: Diagnosis not present

## 2019-03-22 DIAGNOSIS — H04551 Acquired stenosis of right nasolacrimal duct: Secondary | ICD-10-CM | POA: Diagnosis not present

## 2019-05-30 ENCOUNTER — Other Ambulatory Visit: Payer: Self-pay | Admitting: Family Medicine

## 2019-05-30 ENCOUNTER — Other Ambulatory Visit: Payer: Self-pay | Admitting: *Deleted

## 2019-05-30 ENCOUNTER — Other Ambulatory Visit: Payer: Self-pay

## 2019-05-30 ENCOUNTER — Other Ambulatory Visit: Payer: Medicare Other

## 2019-05-30 DIAGNOSIS — R399 Unspecified symptoms and signs involving the genitourinary system: Secondary | ICD-10-CM

## 2019-05-30 LAB — URINALYSIS
Bilirubin, UA: NEGATIVE
Glucose, UA: NEGATIVE
Ketones, UA: NEGATIVE
Nitrite, UA: NEGATIVE
Specific Gravity, UA: 1.025 (ref 1.005–1.030)
Urobilinogen, Ur: 0.2 mg/dL (ref 0.2–1.0)
pH, UA: 5.5 (ref 5.0–7.5)

## 2019-05-30 MED ORDER — SULFAMETHOXAZOLE-TRIMETHOPRIM 800-160 MG PO TABS: 1.0000 | ORAL_TABLET | Freq: Two times a day (BID) | ORAL | 0 refills | Status: DC

## 2019-05-30 NOTE — Telephone Encounter (Signed)
Patient in today for UTI symptoms.  Per Dr. Livia Snellen urine results showed infections and antibiotic was sent to patient's pharmacy.  Patient aware.

## 2019-06-01 LAB — URINE CULTURE

## 2019-06-02 ENCOUNTER — Other Ambulatory Visit: Payer: Self-pay | Admitting: Family Medicine

## 2019-06-02 MED ORDER — NITROFURANTOIN MONOHYD MACRO 100 MG PO CAPS: 100.0000 mg | ORAL_CAPSULE | Freq: Two times a day (BID) | ORAL | 0 refills | Status: DC

## 2019-06-13 DIAGNOSIS — E119 Type 2 diabetes mellitus without complications: Secondary | ICD-10-CM | POA: Diagnosis not present

## 2019-06-13 DIAGNOSIS — Z961 Presence of intraocular lens: Secondary | ICD-10-CM | POA: Diagnosis not present

## 2019-06-13 DIAGNOSIS — H353132 Nonexudative age-related macular degeneration, bilateral, intermediate dry stage: Secondary | ICD-10-CM | POA: Diagnosis not present

## 2019-06-13 LAB — HM DIABETES EYE EXAM

## 2019-08-01 ENCOUNTER — Telehealth: Payer: Self-pay | Admitting: Family Medicine

## 2019-08-01 NOTE — Chronic Care Management (AMB) (Signed)
  Chronic Care Management   Note  08/01/2019 Name: Sophia [name] MRN: [id number] DOB: Sep 19, 1931  Sophia [name] is a 86 y.o. year old female who is a primary care patient of [name], [name], MD. I reached out to [name] by phone today in response to a referral sent by Sophia [name].     Sophia [name] was given information about Chronic Care Management services today including:  1. CCM service includes personalized support from designated clinical staff supervised by her physician, including individualized [name] of care and coordination with other care providers 2. 24/7 contact phone numbers for assistance for urgent and routine care needs. 3. Service will only be billed when office clinical staff spend 20 minutes or more in a month to coordinate care. 4. Only one practitioner may furnish and bill the service in a calendar month. 5. The patient may stop CCM services at any time (effective at the end of the month) by phone call to the office staff. 6. The patient will be responsible for cost sharing (co-pay) of up to 20% of the service fee (after annual deductible is met).  Patient agreed to services and verbal consent obtained.   Follow up [name]: Telephone appointment with care management team member scheduled for:11/22/2019  Noreene Larsson, [place] [place], [place], [place] [address] Direct Dial: [phone] [email or web address]: [email or web address]

## 2019-08-28 DIAGNOSIS — Z23 Encounter for immunization: Secondary | ICD-10-CM | POA: Diagnosis not present

## 2019-08-29 ENCOUNTER — Ambulatory Visit (INDEPENDENT_AMBULATORY_CARE_PROVIDER_SITE_OTHER): Payer: Medicare Other | Admitting: Family Medicine

## 2019-08-29 ENCOUNTER — Other Ambulatory Visit: Payer: Self-pay

## 2019-08-29 ENCOUNTER — Encounter: Payer: Self-pay | Admitting: Family Medicine

## 2019-08-29 VITALS — BP 137/67 | HR 61 | Temp 97.3°F | Ht 62.0 in | Wt 126.5 lb

## 2019-08-29 DIAGNOSIS — I10 Essential (primary) hypertension: Secondary | ICD-10-CM | POA: Diagnosis not present

## 2019-08-29 DIAGNOSIS — E119 Type 2 diabetes mellitus without complications: Secondary | ICD-10-CM | POA: Diagnosis not present

## 2019-08-29 DIAGNOSIS — E782 Mixed hyperlipidemia: Secondary | ICD-10-CM | POA: Diagnosis not present

## 2019-08-29 DIAGNOSIS — M810 Age-related osteoporosis without current pathological fracture: Secondary | ICD-10-CM | POA: Diagnosis not present

## 2019-08-29 DIAGNOSIS — R350 Frequency of micturition: Secondary | ICD-10-CM

## 2019-08-29 DIAGNOSIS — E038 Other specified hypothyroidism: Secondary | ICD-10-CM

## 2019-08-29 DIAGNOSIS — K219 Gastro-esophageal reflux disease without esophagitis: Secondary | ICD-10-CM | POA: Diagnosis not present

## 2019-08-29 LAB — BAYER DCA HB A1C WAIVED: HB A1C (BAYER DCA - WAIVED): 6.4 % (ref <7.0)

## 2019-08-29 MED ORDER — LEVOTHYROXINE SODIUM 75 MCG PO TABS: ORAL_TABLET | ORAL | 1 refills | Status: DC

## 2019-08-29 MED ORDER — METFORMIN HCL ER 500 MG PO TB24: 500.0000 mg | ORAL_TABLET | Freq: Every day | ORAL | 2 refills | Status: DC

## 2019-08-29 MED ORDER — FERROUS SULFATE 325 (65 FE) MG PO TABS: 325.0000 mg | ORAL_TABLET | Freq: Every day | ORAL | Status: AC

## 2019-08-29 MED ORDER — OMEPRAZOLE 40 MG PO CPDR: DELAYED_RELEASE_CAPSULE | ORAL | 1 refills | Status: DC

## 2019-08-29 MED ORDER — OLMESARTAN MEDOXOMIL 40 MG PO TABS: 40.0000 mg | ORAL_TABLET | Freq: Every day | ORAL | 1 refills | Status: DC

## 2019-08-29 MED ORDER — CARVEDILOL 25 MG PO TABS: ORAL_TABLET | ORAL | 1 refills | Status: DC

## 2019-08-29 MED ORDER — ALENDRONATE SODIUM 70 MG PO TABS: 70.0000 mg | ORAL_TABLET | ORAL | 5 refills | Status: DC

## 2019-08-29 MED ORDER — AMLODIPINE BESYLATE 5 MG PO TABS: 5.0000 mg | ORAL_TABLET | Freq: Every day | ORAL | 1 refills | Status: DC

## 2019-08-29 MED ORDER — SIMVASTATIN 40 MG PO TABS: ORAL_TABLET | ORAL | 1 refills | Status: DC

## 2019-08-29 MED ORDER — SOLIFENACIN SUCCINATE 5 MG PO TABS: 5.0000 mg | ORAL_TABLET | Freq: Every day | ORAL | 1 refills | Status: DC

## 2019-08-29 MED ORDER — BLOOD GLUCOSE MONITOR KIT: PACK | 0 refills | Status: DC

## 2019-08-29 NOTE — Progress Notes (Signed)
Subjective:  Patient ID: Sophia [name], female    DOB: 17-Feb-1932  Age: 86 y.o. MRN: [id number]  CC: Medical Management of Chronic Issues   HPI Sophia [name] presents for  follow-up of hypertension. Patient has no history of headache chest pain or shortness of breath or recent cough. Patient also denies symptoms of TIA such as focal numbness or weakness. Patient denies side effects from medication. States taking it regularly.   follow-up on  thyroid. The patient has a history of hypothyroidism for many years. It has been stable recently. Pt. denies any change in  voice, loss of hair, heat or cold intolerance. Energy level has been adequate to good. Patient denies constipation and diarrhea. No myxedema. Medication is as noted below. Verified that pt is taking it daily on an empty stomach. Well tolerated.    in for follow-up of elevated cholesterol. Doing well without complaints on current medication. Denies side effects of statin including myalgia and arthralgia and nausea. Currently no chest pain, shortness of breath or other cardiovascular related symptoms noted.  Patient in for follow-up of GERD. Currently asymptomatic taking  PPI daily. There is no chest pain or heartburn. No hematemesis and no melena. No dysphagia or choking. Onset is remote. Progression is stable. Complicating factors, none. Pt. Borderline diabetic. Due for A1c. Not following diet. History Sophia [name] has a past medical history of Chronic kidney disease, Diabetes mellitus without complication ([place]), Hyperlipidemia, and Hypertension.   She has a past surgical history that includes Cholecystectomy; Sigmoidoscopy; Esophagogastroduodenoscopy (N/A, 04/29/2015); and Eye surgery (Right, 01/19/2019).   Her family history includes Colon cancer in her sister; Heart disease in her brother, father, and sister.She reports that she has never smoked. She has never used smokeless tobacco. She reports that she does not drink  alcohol or use drugs.  Current Outpatient Medications on File Prior to Visit  Medication Sig Dispense Refill  . Calcium-Magnesium 500-250 MG TABS Take 1 tablet by mouth daily with supper. 30 each 5  . VITAMIN D, ERGOCALCIFEROL, PO Take by mouth.     No current facility-administered medications on file prior to visit.    ROS Review of Systems  Constitutional: Negative.   HENT: Negative for congestion.   Eyes: Negative for visual disturbance.  Respiratory: Negative for shortness of breath.   Cardiovascular: Negative for chest pain.  Gastrointestinal: Negative for abdominal pain, constipation, diarrhea, nausea and vomiting.  Genitourinary: Negative for difficulty urinating.  Musculoskeletal: Negative for arthralgias and myalgias.  Neurological: Negative for headaches.  Psychiatric/Behavioral: Negative for sleep disturbance.    Objective:  BP 137/67   Pulse 61   Temp (!) 97.3 F (36.3 C) (Oral)   Ht '5\' 2"'  (1.575 m)   Wt 126 lb 8 oz (57.4 kg)   BMI 23.14 kg/m   BP Readings from Last 3 Encounters:  08/29/19 137/67  01/26/19 117/66  06/20/18 126/64    Wt Readings from Last 3 Encounters:  08/29/19 126 lb 8 oz (57.4 kg)  01/26/19 128 lb (58.1 kg)  06/20/18 135 lb 4 oz (61.3 kg)     Physical Exam Constitutional:      General: She is not in acute distress.    Appearance: She is well-developed.  HENT:     Head: Normocephalic and atraumatic.     Right Ear: External ear normal.     Left Ear: External ear normal.     Nose: Nose normal.  Eyes:     Conjunctiva/sclera: Conjunctivae normal.     Pupils: Pupils  are equal, round, and reactive to light.  Neck:     Thyroid: No thyromegaly.  Cardiovascular:     Rate and Rhythm: Normal rate and regular rhythm.     Heart sounds: Normal heart sounds. No murmur.  Pulmonary:     Effort: Pulmonary effort is normal. No respiratory distress.     Breath sounds: Normal breath sounds. No wheezing or rales.  Abdominal:     General:  Bowel sounds are normal. There is no distension.     Palpations: Abdomen is soft.     Tenderness: There is no abdominal tenderness.  Musculoskeletal:     Cervical back: Normal range of motion and neck supple.  Lymphadenopathy:     Cervical: No cervical adenopathy.  Skin:    General: Skin is warm and dry.  Neurological:     Mental Status: She is alert and oriented to person, place, and time.     Deep Tendon Reflexes: Reflexes are normal and symmetric.  Psychiatric:        Behavior: Behavior normal.        Thought Content: Thought content normal.        Judgment: Judgment normal.    Diabetic foot exam was performed with the following findings:   No deformities, ulcerations, or other skin breakdown Normal sensation of 10g monofilament Intact posterior tibialis and dorsalis pedis pulses       Assessment & Plan:   Sophia [name] was seen today for medical management of chronic issues.  Diagnoses and all orders for this visit:  Mixed hyperlipidemia -     CBC with Differential/Platelet -     CMP14+EGFR -     Lipid panel -     Bayer DCA Hb A1c Waived -     simvastatin (ZOCOR) 40 MG tablet; TAKE 1/2 (ONE-HALF) TABLET BY MOUTH ONCE DAILY AT  6PM  Other specified hypothyroidism -     CBC with Differential/Platelet -     CMP14+EGFR -     Lipid panel -     Bayer DCA Hb A1c Waived -     Thyroid Panel With TSH -     levothyroxine (SYNTHROID) 75 MCG tablet; TAKE 1 TABLET BY MOUTH IN THE MORNING ON AN EMPTY STOMACH WAIT  ONE  HOUR  BEFORE  EATING  Essential (primary) hypertension -     CBC with Differential/Platelet -     CMP14+EGFR -     Lipid panel -     Bayer DCA Hb A1c Waived -     amLODipine (NORVASC) 5 MG tablet; Take 1 tablet (5 mg total) by mouth daily. -     carvedilol (COREG) 25 MG tablet; TAKE 1 TABLET BY MOUTH TWICE DAILY WITH A MEAL -     olmesartan (BENICAR) 40 MG tablet; Take 1 tablet (40 mg total) by mouth daily. (Please make 6 mos Aug appt)  Diabetes mellitus without  complication ([place]) -     CBC with Differential/Platelet -     CMP14+EGFR -     Lipid panel -     Bayer DCA Hb A1c Waived -     blood glucose meter kit and supplies KIT; Use up to 4x daily prn -     olmesartan (BENICAR) 40 MG tablet; Take 1 tablet (40 mg total) by mouth daily. (Please make 6 mos Aug appt) -     metFORMIN (GLUCOPHAGE-XR) 500 MG 24 hr tablet; Take 1 tablet (500 mg total) by mouth daily with breakfast.  Osteoporosis, unspecified  osteoporosis type, unspecified pathological fracture presence -     alendronate (FOSAMAX) 70 MG tablet; Take 1 tablet (70 mg total) by mouth every 7 (seven) days. Take with a full glass of water on an empty stomach.  Hyperglycemia  Gastroesophageal reflux disease without esophagitis -     omeprazole (PRILOSEC) 40 MG capsule; TAKE 1 CAPSULE ON AN EMPTY STOMACH 1 HOUR BEFORE BREAKFAST AND ANOTHER AT BEDTIME (2 HOURS AFTER YOUR LAST FOOD AND DRINK EXCEPT WATER)  Urine frequency -     solifenacin (VESICARE) 5 MG tablet; Take 1 tablet (5 mg total) by mouth daily. For urinary control  Other orders -     ferrous sulfate 325 (65 FE) MG tablet; Take 1 tablet (325 mg total) by mouth daily with breakfast.   Allergies as of 08/29/2019   No Known Allergies     Medication List       Accurate as of August 29, 2019 11:32 AM. If you have any questions, ask your nurse or doctor.        STOP taking these medications   nitrofurantoin (macrocrystal-monohydrate) 100 MG capsule Commonly known as: Macrobid Stopped by: Claretta Fraise, MD   sulfamethoxazole-trimethoprim 800-160 MG tablet Commonly known as: BACTRIM DS Stopped by: Claretta Fraise, MD     TAKE these medications   alendronate 70 MG tablet Commonly known as: FOSAMAX Take 1 tablet (70 mg total) by mouth every 7 (seven) days. Take with a full glass of water on an empty stomach.   amLODipine 5 MG tablet Commonly known as: NORVASC Take 1 tablet (5 mg total) by mouth daily.   blood glucose meter kit  and supplies Kit Use up to 4x daily prn   Calcium-Magnesium 500-250 MG Tabs Take 1 tablet by mouth daily with supper.   carvedilol 25 MG tablet Commonly known as: COREG TAKE 1 TABLET BY MOUTH TWICE DAILY WITH A MEAL   ferrous sulfate 325 (65 FE) MG tablet Take 1 tablet (325 mg total) by mouth daily with breakfast.   levothyroxine 75 MCG tablet Commonly known as: SYNTHROID TAKE 1 TABLET BY MOUTH IN THE MORNING ON AN EMPTY STOMACH WAIT  ONE  HOUR  BEFORE  EATING   metFORMIN 500 MG 24 hr tablet Commonly known as: GLUCOPHAGE-XR Take 1 tablet (500 mg total) by mouth daily with breakfast. Started by: Claretta Fraise, MD   olmesartan 40 MG tablet Commonly known as: BENICAR Take 1 tablet (40 mg total) by mouth daily. (Please make 6 mos Aug appt)   omeprazole 40 MG capsule Commonly known as: PRILOSEC TAKE 1 CAPSULE ON AN EMPTY STOMACH 1 HOUR BEFORE BREAKFAST AND ANOTHER AT BEDTIME (2 HOURS AFTER YOUR LAST FOOD AND DRINK EXCEPT WATER)   simvastatin 40 MG tablet Commonly known as: ZOCOR TAKE 1/2 (ONE-HALF) TABLET BY MOUTH ONCE DAILY AT  6PM   solifenacin 5 MG tablet Commonly known as: VESICARE Take 1 tablet (5 mg total) by mouth daily. For urinary control Started by: Claretta Fraise, MD   VITAMIN D (ERGOCALCIFEROL) PO Take by mouth.       Meds ordered this encounter  Medications  . alendronate (FOSAMAX) 70 MG tablet    Sig: Take 1 tablet (70 mg total) by mouth every 7 (seven) days. Take with a full glass of water on an empty stomach.    Dispense:  4 tablet    Refill:  5  . amLODipine (NORVASC) 5 MG tablet    Sig: Take 1 tablet (5 mg total) by mouth  daily.    Dispense:  90 tablet    Refill:  1  . blood glucose meter kit and supplies KIT    Sig: Use up to 4x daily prn    Dispense:  1 each    Refill:  0    ICD 10 E11.9    Order Specific Question:   Number of strips    Answer:   100    Order Specific Question:   Number of lancets    Answer:   100  . carvedilol (COREG) 25  MG tablet    Sig: TAKE 1 TABLET BY MOUTH TWICE DAILY WITH A MEAL    Dispense:  180 tablet    Refill:  1    Please consider 90 day supplies to promote better adherence  . levothyroxine (SYNTHROID) 75 MCG tablet    Sig: TAKE 1 TABLET BY MOUTH IN THE MORNING ON AN EMPTY STOMACH WAIT  ONE  HOUR  BEFORE  EATING    Dispense:  90 tablet    Refill:  1  . ferrous sulfate 325 (65 FE) MG tablet    Sig: Take 1 tablet (325 mg total) by mouth daily with breakfast.    Dispense:     . olmesartan (BENICAR) 40 MG tablet    Sig: Take 1 tablet (40 mg total) by mouth daily. (Please make 6 mos Aug appt)    Dispense:  90 tablet    Refill:  1  . omeprazole (PRILOSEC) 40 MG capsule    Sig: TAKE 1 CAPSULE ON AN EMPTY STOMACH 1 HOUR BEFORE BREAKFAST AND ANOTHER AT BEDTIME (2 HOURS AFTER YOUR LAST FOOD AND DRINK EXCEPT WATER)    Dispense:  180 capsule    Refill:  1  . simvastatin (ZOCOR) 40 MG tablet    Sig: TAKE 1/2 (ONE-HALF) TABLET BY MOUTH ONCE DAILY AT  6PM    Dispense:  90 tablet    Refill:  1  . metFORMIN (GLUCOPHAGE-XR) 500 MG 24 hr tablet    Sig: Take 1 tablet (500 mg total) by mouth daily with breakfast.    Dispense:  30 tablet    Refill:  2  . solifenacin (VESICARE) 5 MG tablet    Sig: Take 1 tablet (5 mg total) by mouth daily. For urinary control    Dispense:  90 tablet    Refill:  1      Follow-up: Return in about 3 months (around 11/28/2019) for hypertension, Hypothyroidism.  Claretta Fraise, M.D.

## 2019-08-29 NOTE — Patient Instructions (Signed)
Carbohydrate Counting for Diabetes Mellitus, Adult  Carbohydrate counting is a method of keeping track of how many carbohydrates you eat. Eating carbohydrates naturally increases the amount of sugar (glucose) in the blood. Counting how many carbohydrates you eat helps keep your blood glucose within normal limits, which helps you manage your diabetes (diabetes mellitus). It is important to know how many carbohydrates you can safely have in each meal. This is different for every person. A diet and nutrition specialist (registered dietitian) can help you make a meal plan and calculate how many carbohydrates you should have at each meal and snack. Carbohydrates are found in the following foods:  Grains, such as breads and cereals.  Dried beans and soy products.  Starchy vegetables, such as potatoes, peas, and corn.  Fruit and fruit juices.  Milk and yogurt.  Sweets and snack foods, such as cake, cookies, candy, chips, and soft drinks. How do I count carbohydrates? There are two ways to count carbohydrates in food. You can use either of the methods or a combination of both. Reading "Nutrition Facts" on packaged food The "Nutrition Facts" list is included on the labels of almost all packaged foods and beverages in the [place] It includes:  The serving size.  Information about nutrients in each serving, including the grams (g) of carbohydrate per serving. To use the "Nutrition Facts":  Decide how many servings you will have.  Multiply the number of servings by the number of carbohydrates per serving.  The resulting number is the total amount of carbohydrates that you will be having. Learning standard serving sizes of other foods When you eat carbohydrate foods that are not packaged or do not include "Nutrition Facts" on the label, you need to measure the servings in order to count the amount of carbohydrates:  Measure the foods that you will eat with a food scale or measuring cup, if  needed.  Decide how many standard-size servings you will eat.  Multiply the number of servings by 15. Most carbohydrate-rich foods have about 15 g of carbohydrates per serving. ? For example, if you eat 8 oz (170 g) of strawberries, you will have eaten 2 servings and 30 g of carbohydrates (2 servings x 15 g = 30 g).  For foods that have more than one food mixed, such as soups and casseroles, you must count the carbohydrates in each food that is included. The following list contains standard serving sizes of common carbohydrate-rich foods. Each of these servings has about 15 g of carbohydrates:   hamburger bun or  English muffin.   oz (15 mL) syrup.   oz (14 g) jelly.  1 slice of bread.  1 six-inch tortilla.  3 oz (85 g) cooked rice or pasta.  4 oz (113 g) cooked dried beans.  4 oz (113 g) starchy vegetable, such as peas, corn, or potatoes.  4 oz (113 g) hot cereal.  4 oz (113 g) mashed potatoes or  of a large baked potato.  4 oz (113 g) canned or frozen fruit.  4 oz (120 mL) fruit juice.  4-6 crackers.  6 chicken nuggets.  6 oz (170 g) unsweetened dry cereal.  6 oz (170 g) plain fat-free yogurt or yogurt sweetened with artificial sweeteners.  8 oz (240 mL) milk.  8 oz (170 g) fresh fruit or one small piece of fruit.  24 oz (680 g) popped popcorn. Example of carbohydrate counting Sample meal  3 oz (85 g) chicken breast.  6 oz (170 g)   brown rice.  4 oz (113 g) corn.  8 oz (240 mL) milk.  8 oz (170 g) strawberries with sugar-free whipped topping. Carbohydrate calculation 1. Identify the foods that contain carbohydrates: ? Rice. ? Corn. ? Milk. ? Strawberries. 2. Calculate how many servings you have of each food: ? 2 servings rice. ? 1 serving corn. ? 1 serving milk. ? 1 serving strawberries. 3. Multiply each number of servings by 15 g: ? 2 servings rice x 15 g = 30 g. ? 1 serving corn x 15 g = 15 g. ? 1 serving milk x 15 g = 15 g. ? 1  serving strawberries x 15 g = 15 g. 4. Add together all of the amounts to find the total grams of carbohydrates eaten: ? 30 g + 15 g + 15 g + 15 g = 75 g of carbohydrates total. Summary  Carbohydrate counting is a method of keeping track of how many carbohydrates you eat.  Eating carbohydrates naturally increases the amount of sugar (glucose) in the blood.  Counting how many carbohydrates you eat helps keep your blood glucose within normal limits, which helps you manage your diabetes.  A diet and nutrition specialist (registered dietitian) can help you make a meal plan and calculate how many carbohydrates you should have at each meal and snack. This information is not intended to replace advice given to you by your health care provider. Make sure you discuss any questions you have with your health care provider. Document Revised: 11/26/2016 Document Reviewed: 10/16/2015 Elsevier Patient Education  2020 Elsevier Inc.  

## 2019-08-30 LAB — CBC WITH DIFFERENTIAL/PLATELET
Basophils Absolute: 0.1 10*3/uL (ref 0.0–0.2)
Basos: 1 %
EOS (ABSOLUTE): 0.2 10*3/uL (ref 0.0–0.4)
Eos: 3 %
Hematocrit: 37.6 % (ref 34.0–46.6)
Hemoglobin: 12.7 g/dL (ref 11.1–15.9)
Immature Grans (Abs): 0 10*3/uL (ref 0.0–0.1)
Immature Granulocytes: 0 %
Lymphocytes Absolute: 1.6 10*3/uL (ref 0.7–3.1)
Lymphs: 23 %
MCH: 33.1 pg — ABNORMAL HIGH (ref 26.6–33.0)
MCHC: 33.8 g/dL (ref 31.5–35.7)
MCV: 98 fL — ABNORMAL HIGH (ref 79–97)
Monocytes Absolute: 0.5 10*3/uL (ref 0.1–0.9)
Monocytes: 7 %
Neutrophils Absolute: 4.4 10*3/uL (ref 1.4–7.0)
Neutrophils: 66 %
Platelets: 239 10*3/uL (ref 150–450)
RBC: 3.84 x10E6/uL (ref 3.77–5.28)
RDW: 12.6 % (ref 11.7–15.4)
WBC: 6.8 10*3/uL (ref 3.4–10.8)

## 2019-08-30 LAB — LIPID PANEL
Chol/HDL Ratio: 2.9 ratio (ref 0.0–4.4)
Cholesterol, Total: 192 mg/dL (ref 100–199)
HDL: 67 mg/dL (ref >39)
LDL Chol Calc (NIH): 94 mg/dL (ref 0–99)
Triglycerides: 181 mg/dL — ABNORMAL HIGH (ref 0–149)
VLDL Cholesterol Cal: 31 mg/dL (ref 5–40)

## 2019-08-30 LAB — CMP14+EGFR
ALT: 9 IU/L (ref 0–32)
AST: 19 IU/L (ref 0–40)
Albumin/Globulin Ratio: 1.6 (ref 1.2–2.2)
Albumin: 4.3 g/dL (ref 3.6–4.6)
Alkaline Phosphatase: 58 IU/L (ref 39–117)
BUN/Creatinine Ratio: 13 (ref 12–28)
BUN: 16 mg/dL (ref 8–27)
Bilirubin Total: 0.6 mg/dL (ref 0.0–1.2)
CO2: 21 mmol/L (ref 20–29)
Calcium: 9.8 mg/dL (ref 8.7–10.3)
Chloride: 103 mmol/L (ref 96–106)
Creatinine, Ser: 1.27 mg/dL — ABNORMAL HIGH (ref 0.57–1.00)
GFR calc Af Amer: 44 mL/min/{1.73_m2} — ABNORMAL LOW (ref >59)
GFR calc non Af Amer: 39 mL/min/{1.73_m2} — ABNORMAL LOW (ref >59)
Globulin, Total: 2.7 g/dL (ref 1.5–4.5)
Glucose: 106 mg/dL — ABNORMAL HIGH (ref 65–99)
Potassium: 4 mmol/L (ref 3.5–5.2)
Sodium: 141 mmol/L (ref 134–144)
Total Protein: 7 g/dL (ref 6.0–8.5)

## 2019-08-30 LAB — THYROID PANEL WITH TSH
Free Thyroxine Index: 3.2 (ref 1.2–4.9)
T3 Uptake Ratio: 32 % (ref 24–39)
T4, Total: 10 ug/dL (ref 4.5–12.0)
TSH: 1.55 u[IU]/mL (ref 0.450–4.500)

## 2019-08-30 NOTE — Progress Notes (Signed)
Hello Sophia [name],  Your lab result is normal and/or stable.Some minor variations that are not significant are commonly marked abnormal, but do not represent any medical problem for you.  Best regards, Lariza Cothron, M.D.

## 2019-09-25 DIAGNOSIS — Z23 Encounter for immunization: Secondary | ICD-10-CM | POA: Diagnosis not present

## 2019-11-22 ENCOUNTER — Ambulatory Visit: Payer: Medicare Other | Admitting: *Deleted

## 2019-11-22 NOTE — Chronic Care Management (AMB) (Signed)
  Chronic Care Management    Initial Visit Outreach   11/22/2019 Name: Sophia [name] MRN: [id number] DOB: [birth year]  Referred by: Claretta Fraise, MD Reason for referral : Chronic Care Management (Initial Visit)   An unsuccessful Initial Telephone Visit was attempted today. The patient was referred to the case management team for assistance with care management and care coordination.   RN Care Plan   . Chronic Disease Management Needs       CARE PLAN ENTRY (see longtitudinal plan of care for additional care plan information)  Current Barriers:  . Chronic Disease Management support, education, and care coordination needs related to HTN, DM, CKD, osteoporosis, HLD  Clinical Goals: . Over the next 10 days, patient will be contacted by a Care Guide to reschedule their Initial CCM Visit . Over the next 30 days, patient will have an Initial CCM Visit with a member of the embedded CCM team to discuss self-management of their chronic medical conditions  Interventions: . Chart reviewed in preparation for initial visit telephone call . Collaboration with other care team members as needed . Unsuccessful outreach to patient  . A HIPPA compliant phone message was left for the patient providing contact information and requesting a return call.  . Request sent to care guides to reach out and reschedule patient's initial visit  Patient Self Care Activities: . Undetermined   Initial goal documentation        Follow Up Plan: A HIPPA compliant phone message was left for the patient providing contact information and requesting a return call.  The care management team will reach out to the patient again over the next 10 days.  Next PCP appointment scheduled for: 12/04/19 with Dr Livia Snellen  Chong Sicilian, BSN, RN-BC [place] [facility] [place] [facility] Direct Dial: [phone]

## 2019-11-23 ENCOUNTER — Other Ambulatory Visit: Payer: Self-pay | Admitting: Family Medicine

## 2019-11-23 ENCOUNTER — Telehealth: Payer: Self-pay | Admitting: Family Medicine

## 2019-11-23 DIAGNOSIS — E119 Type 2 diabetes mellitus without complications: Secondary | ICD-10-CM

## 2019-11-23 NOTE — Progress Notes (Signed)
Patient is rescheduled for 12/18/2019 for intake appointment with Nicki Reaper

## 2019-11-23 NOTE — Chronic Care Management (AMB) (Signed)
  Chronic Care Management   Note  11/23/2019 Name: Sophia [name] MRN: [id number] DOB: Nov 01, 1931  Sophia [name] is a 86 y.o. year old female who is a primary care patient of [name], [name], MD and is actively engaged with the care management team. I reached out to [name] by phone today to assist with re-scheduling an initial visit with the Licensed Clinical Social Worker for intake appointment for RN CM.  Follow up plan: Telephone appointment with care management team member scheduled for: 12/18/2019.  [place],  [address] Direct Dial: [phone] [email or web address]: [email or web address]

## 2019-11-28 ENCOUNTER — Ambulatory Visit: Payer: Medicare Other | Admitting: Family Medicine

## 2019-12-04 ENCOUNTER — Other Ambulatory Visit: Payer: Self-pay

## 2019-12-04 ENCOUNTER — Encounter: Payer: Self-pay | Admitting: Family Medicine

## 2019-12-04 ENCOUNTER — Ambulatory Visit (INDEPENDENT_AMBULATORY_CARE_PROVIDER_SITE_OTHER): Payer: Medicare Other | Admitting: Family Medicine

## 2019-12-04 VITALS — BP 132/64 | HR 67 | Temp 97.4°F | Resp 20 | Ht 62.0 in | Wt 125.2 lb

## 2019-12-04 DIAGNOSIS — R350 Frequency of micturition: Secondary | ICD-10-CM | POA: Diagnosis not present

## 2019-12-04 DIAGNOSIS — E782 Mixed hyperlipidemia: Secondary | ICD-10-CM

## 2019-12-04 DIAGNOSIS — E119 Type 2 diabetes mellitus without complications: Secondary | ICD-10-CM | POA: Diagnosis not present

## 2019-12-04 DIAGNOSIS — I10 Essential (primary) hypertension: Secondary | ICD-10-CM

## 2019-12-04 DIAGNOSIS — E038 Other specified hypothyroidism: Secondary | ICD-10-CM | POA: Diagnosis not present

## 2019-12-04 LAB — BAYER DCA HB A1C WAIVED: HB A1C (BAYER DCA - WAIVED): 6.1 % (ref <7.0)

## 2019-12-04 MED ORDER — SOLIFENACIN SUCCINATE 10 MG PO TABS: 10.0000 mg | ORAL_TABLET | Freq: Every day | ORAL | 1 refills | Status: DC

## 2019-12-04 NOTE — Progress Notes (Signed)
Subjective:  Patient ID: Sophia [name],  female    DOB: [birth year]  Age: 86 y.o.    CC: Medical Management of Chronic Issues   HPI Sophia [name] presents for  follow-up of hypertension. Patient has no history of headache chest pain or shortness of breath or recent cough. Patient also denies symptoms of TIA such as numbness weakness lateralizing. Patient denies side effects from medication. States taking it regularly.  Patient also  in for follow-up of elevated cholesterol. Doing well without complaints on current medication. Denies side effects  including myalgia and arthralgia and nausea. Also in today for liver function testing. Currently no chest pain, shortness of breath or other cardiovascular related symptoms noted.  Follow-up of diabetes. Patient does not check blood sugar at home regularly. Patient denies symptoms such as excessive hunger or urinary frequency, excessive hunger, nausea No significant hypoglycemic spells noted. Medications reviewed. Pt reports taking them regularly. Pt. denies complication/adverse reaction today.    History Sophia [name] has a past medical history of Chronic kidney disease, Diabetes mellitus without complication ([place]), Hyperlipidemia, and Hypertension.   She has a past surgical history that includes Cholecystectomy; Sigmoidoscopy; Esophagogastroduodenoscopy (N/A, 04/29/2015); and Eye surgery (Right, 01/19/2019).   Her family history includes Colon cancer in her sister; Heart disease in her brother, father, and sister.She reports that she has never smoked. She has never used smokeless tobacco. She reports that she does not drink alcohol and does not use drugs.  Current Outpatient Medications on File Prior to Visit  Medication Sig Dispense Refill  . alendronate (FOSAMAX) 70 MG tablet Take 1 tablet (70 mg total) by mouth every 7 (seven) days. Take with a full glass of water on an empty stomach. 4 tablet 5  . amLODipine (NORVASC) 5 MG tablet  Take 1 tablet (5 mg total) by mouth daily. 90 tablet 1  . blood glucose meter kit and supplies KIT Use up to 4x daily prn 1 each 0  . Calcium-Magnesium 500-250 MG TABS Take 1 tablet by mouth daily with supper. 30 each 5  . carvedilol (COREG) 25 MG tablet TAKE 1 TABLET BY MOUTH TWICE DAILY WITH A MEAL 180 tablet 1  . ferrous sulfate 325 (65 FE) MG tablet Take 1 tablet (325 mg total) by mouth daily with breakfast.    . levothyroxine (SYNTHROID) 75 MCG tablet TAKE 1 TABLET BY MOUTH IN THE MORNING ON AN EMPTY STOMACH WAIT  ONE  HOUR  BEFORE  EATING 90 tablet 1  . metFORMIN (GLUCOPHAGE-XR) 500 MG 24 hr tablet Take 1 tablet by mouth once daily with breakfast 90 tablet 0  . olmesartan (BENICAR) 40 MG tablet Take 1 tablet (40 mg total) by mouth daily. (Please make 6 mos Aug appt) 90 tablet 1  . omeprazole (PRILOSEC) 40 MG capsule TAKE 1 CAPSULE ON AN EMPTY STOMACH 1 HOUR BEFORE BREAKFAST AND ANOTHER AT BEDTIME (2 HOURS AFTER YOUR LAST FOOD AND DRINK EXCEPT WATER) 180 capsule 1  . simvastatin (ZOCOR) 40 MG tablet TAKE 1/2 (ONE-HALF) TABLET BY MOUTH ONCE DAILY AT  6PM 90 tablet 1  . VITAMIN D, ERGOCALCIFEROL, PO Take by mouth.     No current facility-administered medications on file prior to visit.    ROS Review of Systems  Constitutional: Negative.   HENT: Negative for congestion.   Eyes: Negative for visual disturbance.  Respiratory: Negative for shortness of breath.   Cardiovascular: Negative for chest pain.  Gastrointestinal: Negative for abdominal pain, constipation, diarrhea, nausea and vomiting.  Genitourinary: Negative  for difficulty urinating.  Musculoskeletal: Negative for arthralgias and myalgias.  Neurological: Negative for headaches.  Psychiatric/Behavioral: Negative for sleep disturbance.    Objective:  BP 132/64   Pulse 67   Temp (!) 97.4 F (36.3 C) (Temporal)   Resp 20   Ht '5\' 2"'  (1.575 m)   Wt 125 lb 4 oz (56.8 kg)   SpO2 96%   BMI 22.91 kg/m   BP Readings from Last 3  Encounters:  12/04/19 132/64  08/29/19 137/67  01/26/19 117/66    Wt Readings from Last 3 Encounters:  12/04/19 125 lb 4 oz (56.8 kg)  08/29/19 126 lb 8 oz (57.4 kg)  01/26/19 128 lb (58.1 kg)     Physical Exam Constitutional:      General: She is not in acute distress.    Appearance: She is well-developed.  HENT:     Head: Normocephalic and atraumatic.  Eyes:     Conjunctiva/sclera: Conjunctivae normal.     Pupils: Pupils are equal, round, and reactive to light.  Neck:     Thyroid: No thyromegaly.  Cardiovascular:     Rate and Rhythm: Normal rate and regular rhythm.     Heart sounds: Normal heart sounds. No murmur heard.   Pulmonary:     Effort: Pulmonary effort is normal. No respiratory distress.     Breath sounds: Normal breath sounds. No wheezing or rales.  Abdominal:     General: Bowel sounds are normal. There is no distension.     Palpations: Abdomen is soft.     Tenderness: There is no abdominal tenderness.  Musculoskeletal:        General: Normal range of motion.     Cervical back: Normal range of motion and neck supple.  Lymphadenopathy:     Cervical: No cervical adenopathy.  Skin:    General: Skin is warm and dry.  Neurological:     Mental Status: She is alert and oriented to person, place, and time.  Psychiatric:        Behavior: Behavior normal.        Thought Content: Thought content normal.        Judgment: Judgment normal.     Diabetic Foot Exam - Simple   No data filed        Assessment & Plan:   Sophia [name] was seen today for medical management of chronic issues.  Diagnoses and all orders for this visit:  Mixed hyperlipidemia -     CBC with Differential/Platelet -     CMP14+EGFR -     Lipid panel  Other specified hypothyroidism -     CBC with Differential/Platelet -     CMP14+EGFR -     Lipid panel -     Thyroid Panel With TSH  Essential (primary) hypertension -     CBC with Differential/Platelet -     CMP14+EGFR -     Lipid  panel  Diabetes mellitus without complication (HCC) -     Microalbumin / creatinine urine ratio -     Bayer DCA Hb A1c Waived -     CBC with Differential/Platelet -     CMP14+EGFR -     Lipid panel  Urine frequency -     solifenacin (VESICARE) 10 MG tablet; Take 1 tablet (10 mg total) by mouth daily. For urinary control   I have changed Sophia [name]'s solifenacin. I am also having her maintain her Calcium-Magnesium, (VITAMIN D, ERGOCALCIFEROL, PO), alendronate, amLODipine, blood glucose meter kit and  supplies, carvedilol, levothyroxine, ferrous sulfate, olmesartan, omeprazole, simvastatin, and metFORMIN.  Meds ordered this encounter  Medications  . solifenacin (VESICARE) 10 MG tablet    Sig: Take 1 tablet (10 mg total) by mouth daily. For urinary control    Dispense:  90 tablet    Refill:  1     Follow-up: Return in about 3 months (around 03/05/2020).  Claretta Fraise, M.D.

## 2019-12-05 LAB — CMP14+EGFR
ALT: 9 IU/L (ref 0–32)
AST: 17 IU/L (ref 0–40)
Albumin/Globulin Ratio: 1.7 (ref 1.2–2.2)
Albumin: 4.3 g/dL (ref 3.6–4.6)
Alkaline Phosphatase: 60 IU/L (ref 48–121)
BUN/Creatinine Ratio: 16 (ref 12–28)
BUN: 21 mg/dL (ref 8–27)
Bilirubin Total: 0.3 mg/dL (ref 0.0–1.2)
CO2: 25 mmol/L (ref 20–29)
Calcium: 9.5 mg/dL (ref 8.7–10.3)
Chloride: 107 mmol/L — ABNORMAL HIGH (ref 96–106)
Creatinine, Ser: 1.35 mg/dL — ABNORMAL HIGH (ref 0.57–1.00)
GFR calc Af Amer: 41 mL/min/{1.73_m2} — ABNORMAL LOW (ref >59)
GFR calc non Af Amer: 36 mL/min/{1.73_m2} — ABNORMAL LOW (ref >59)
Globulin, Total: 2.5 g/dL (ref 1.5–4.5)
Glucose: 108 mg/dL — ABNORMAL HIGH (ref 65–99)
Potassium: 4.1 mmol/L (ref 3.5–5.2)
Sodium: 145 mmol/L — ABNORMAL HIGH (ref 134–144)
Total Protein: 6.8 g/dL (ref 6.0–8.5)

## 2019-12-05 LAB — LIPID PANEL
Chol/HDL Ratio: 3.4 ratio (ref 0.0–4.4)
Cholesterol, Total: 191 mg/dL (ref 100–199)
HDL: 57 mg/dL (ref >39)
LDL Chol Calc (NIH): 92 mg/dL (ref 0–99)
Triglycerides: 252 mg/dL — ABNORMAL HIGH (ref 0–149)
VLDL Cholesterol Cal: 42 mg/dL — ABNORMAL HIGH (ref 5–40)

## 2019-12-05 LAB — CBC WITH DIFFERENTIAL/PLATELET
Basophils Absolute: 0.1 10*3/uL (ref 0.0–0.2)
Basos: 1 %
EOS (ABSOLUTE): 0.3 10*3/uL (ref 0.0–0.4)
Eos: 4 %
Hematocrit: 34.7 % (ref 34.0–46.6)
Hemoglobin: 11.6 g/dL (ref 11.1–15.9)
Immature Grans (Abs): 0 10*3/uL (ref 0.0–0.1)
Immature Granulocytes: 0 %
Lymphocytes Absolute: 1.9 10*3/uL (ref 0.7–3.1)
Lymphs: 27 %
MCH: 33 pg (ref 26.6–33.0)
MCHC: 33.4 g/dL (ref 31.5–35.7)
MCV: 99 fL — ABNORMAL HIGH (ref 79–97)
Monocytes Absolute: 0.5 10*3/uL (ref 0.1–0.9)
Monocytes: 7 %
Neutrophils Absolute: 4.2 10*3/uL (ref 1.4–7.0)
Neutrophils: 61 %
Platelets: 233 10*3/uL (ref 150–450)
RBC: 3.51 x10E6/uL — ABNORMAL LOW (ref 3.77–5.28)
RDW: 12.9 % (ref 11.7–15.4)
WBC: 7 10*3/uL (ref 3.4–10.8)

## 2019-12-05 LAB — THYROID PANEL WITH TSH
Free Thyroxine Index: 2.7 (ref 1.2–4.9)
T3 Uptake Ratio: 30 % (ref 24–39)
T4, Total: 9.1 ug/dL (ref 4.5–12.0)
TSH: 0.788 u[IU]/mL (ref 0.450–4.500)

## 2019-12-05 NOTE — Progress Notes (Signed)
Hello Sophia [name],  Your lab result is normal and/or stable.Some minor variations that are not significant are commonly marked abnormal, but do not represent any medical problem for you.  Best regards, Evagelia Knack, M.D.

## 2019-12-12 ENCOUNTER — Encounter: Payer: Self-pay | Admitting: *Deleted

## 2019-12-18 ENCOUNTER — Telehealth: Payer: Medicare Other

## 2019-12-29 ENCOUNTER — Ambulatory Visit (INDEPENDENT_AMBULATORY_CARE_PROVIDER_SITE_OTHER): Payer: Medicare Other | Admitting: Licensed Clinical Social Worker

## 2019-12-29 DIAGNOSIS — N182 Chronic kidney disease, stage 2 (mild): Secondary | ICD-10-CM | POA: Diagnosis not present

## 2019-12-29 DIAGNOSIS — E119 Type 2 diabetes mellitus without complications: Secondary | ICD-10-CM | POA: Diagnosis not present

## 2019-12-29 DIAGNOSIS — R739 Hyperglycemia, unspecified: Secondary | ICD-10-CM

## 2019-12-29 DIAGNOSIS — M810 Age-related osteoporosis without current pathological fracture: Secondary | ICD-10-CM

## 2019-12-29 DIAGNOSIS — I10 Essential (primary) hypertension: Secondary | ICD-10-CM | POA: Diagnosis not present

## 2019-12-29 DIAGNOSIS — E782 Mixed hyperlipidemia: Secondary | ICD-10-CM

## 2019-12-29 DIAGNOSIS — E559 Vitamin D deficiency, unspecified: Secondary | ICD-10-CM

## 2019-12-29 NOTE — Chronic Care Management (AMB) (Signed)
Chronic Care Management    Clinical Social Work Follow Up Note  12/29/2019 Name: Sophia [name] MRN: [id number] DOB: March 13, 1932  Sophia [name] is a 86 y.o. year old female who is a primary care patient of [name], Sophia Gash, MD. The CCM team was consulted for assistance with [name]   Review of patient status, including review of consultants reports, other relevant assessments, and collaboration with appropriate care team members and the patient's provider was performed as part of comprehensive patient evaluation and provision of chronic care management services.    SDOH (Social Determinants of Health) assessments performed: Yes; risk of depression; risk of tobacco use; risk of stress; risk of transport needs; risk of physical inactivity  SDOH Interventions     Most Recent Value  SDOH Interventions  Depression Interventions/Treatment  --  [informed client of [name] support and of [name] support]        Chronic Care Management from 12/29/2019 in [place]  PHQ-9 Total Score 2      GAD 7 : Generalized Anxiety Score 12/29/2019 04/26/2015  Nervous, Anxious, on Edge 0 1  Control/stop worrying 0 1  Worry too much - different things 0 1  Trouble relaxing 0 1  Restless 0 0  Easily annoyed or irritable 0 2  Afraid - awful might happen 0 1  Total GAD 7 Score 0 7  Anxiety Difficulty Somewhat difficult Not difficult at all    Outpatient Encounter Medications as of 12/29/2019  Medication Sig  . alendronate (FOSAMAX) 70 MG tablet Take 1 tablet (70 mg total) by mouth every 7 (seven) days. Take with a full glass of water on an empty stomach.  Marland Kitchen amLODipine (NORVASC) 5 MG tablet Take 1 tablet (5 mg total) by mouth daily.  . blood glucose meter kit and supplies KIT Use up to 4x daily prn  . Calcium-Magnesium 500-250 MG TABS Take 1 tablet by mouth daily with supper.  . carvedilol (COREG) 25 MG tablet TAKE 1 TABLET BY MOUTH TWICE DAILY WITH A MEAL  . ferrous  sulfate 325 (65 FE) MG tablet Take 1 tablet (325 mg total) by mouth daily with breakfast.  . levothyroxine (SYNTHROID) 75 MCG tablet TAKE 1 TABLET BY MOUTH IN THE MORNING ON AN EMPTY STOMACH WAIT  ONE  HOUR  BEFORE  EATING  . metFORMIN (GLUCOPHAGE-XR) 500 MG 24 hr tablet Take 1 tablet by mouth once daily with breakfast  . olmesartan (BENICAR) 40 MG tablet Take 1 tablet (40 mg total) by mouth daily. (Please make 6 mos Aug appt)  . omeprazole (PRILOSEC) 40 MG capsule TAKE 1 CAPSULE ON AN EMPTY STOMACH 1 HOUR BEFORE BREAKFAST AND ANOTHER AT BEDTIME (2 HOURS AFTER YOUR LAST FOOD AND DRINK EXCEPT WATER)  . simvastatin (ZOCOR) 40 MG tablet TAKE 1/2 (ONE-HALF) TABLET BY MOUTH ONCE DAILY AT  6PM  . solifenacin (VESICARE) 10 MG tablet Take 1 tablet (10 mg total) by mouth daily. For urinary control  . VITAMIN D, ERGOCALCIFEROL, PO Take by mouth.   No facility-administered encounter medications on file as of 12/29/2019.     Goals Addressed              This Visit's Progress   .  Client will talk with [name] in next 30 days to discuss health needs of client and client completion of daily activites (pt-stated)        CARE PLAN ENTRY   Current Barriers:  . Patient with chronic diagnoses of HLD, HTN, Osteoporosis, Vitamin  D deficiency, Hyperglycemia, DM, CKD  Clinical Social Work Clinical Goal(s):  Marland Kitchen [name] to call client in next 4 weeks to talk with her about health needs of client and her completion of daily activities  Interventions: . Talked with client about mobility issues of client . Talked with client  about decreased energy of client  . Talked with client about pain issues of client . Talked with client about transport needs of client . Talked with client about relaxation techniques of client (likes to read, watch TV, goes outdoors to do yardwork when able) . Talked with client about social support network (daughter is supportive and her daughter lives at home with client) . Talked with  client about client completion of ADLs . Talked with client about her upcoming medical appointments . Talked with client about her communication with her sister . Talked with client about vision of client . Talked with [name] about her history of cataract surgery . Encouraged [name] to call [facility] as needed for nursing support   Patient Self Care Activities:   Completes ADLs independently Drives car to appointments and to complete errands  Patient Self Care Deficits:    . Transport challenges (only travels short distances )   Initial goal documentation        Follow Up Plan: [name] to call client in next 4 weeks to discuss her management of health needs faced and to discuss her completion of daily activities  [name] MSW, [name] Licensed Clinical Social Worker [facility] [phone]

## 2019-12-29 NOTE — Patient Instructions (Addendum)
Licensed Clinical Social Worker Visit Information  Goals we discussed today:  Goals Addressed              This Visit's Progress   .  Client will talk with [name] in next 30 days to discuss health needs of client and client completion of daily activites (pt-stated)        CARE PLAN ENTRY   Current Barriers:  . Patient with chronic diagnoses of HLD, HTN, Osteoporosis, Vitamin D deficiency, Hyperglycemia, DM, CKD  Clinical Social Work Clinical Goal(s):  Marland Kitchen [name] to call client in next 4 weeks to talk with her about health needs of client and her completion of daily activities  Interventions: . Talked with client about mobility issues of client . Talked with client  about decreased energy of client  . Talked with client about pain issues of client . Talked with client about transport needs of client . Talked with client about relaxation techniques of client (likes to read, watch TV, goes outdoors to do yardwork when able) . Talked with client about social support network (daughter is supportive and her daughter lives at home with client) . Talked with client about client completion of ADLs . Talked with client about her upcoming medical appointments . Talked with client about her communication with her sister . Talked with client about vision of client . Talked with Sophia [name] about her history of cataract surgery . Encouraged Sophia [name] to call [facility] as needed for nursing support   Patient Self Care Activities:   Completes ADLs independently Drives car to appointments and to complete errands  Patient Self Care Deficits:    . Transport challenges (only travels short distances )   Initial goal documentation       Materials Provided: No  Follow Up Plan:  [name] to call client in next 4 weeks to discuss her management of health needs faced and to discuss her completion of daily activities  The patient verbalized understanding of instructions provided today and declined a print copy  of patient instruction materials.   [name].Kerilyn Cortner MSW, [name] Licensed Clinical Social Worker [facility] [phone]

## 2020-02-05 ENCOUNTER — Telehealth: Payer: Medicare Other

## 2020-02-11 DIAGNOSIS — Z23 Encounter for immunization: Secondary | ICD-10-CM | POA: Diagnosis not present

## 2020-02-23 ENCOUNTER — Other Ambulatory Visit: Payer: Self-pay | Admitting: Family Medicine

## 2020-02-23 DIAGNOSIS — I10 Essential (primary) hypertension: Secondary | ICD-10-CM

## 2020-02-23 DIAGNOSIS — E119 Type 2 diabetes mellitus without complications: Secondary | ICD-10-CM

## 2020-03-06 ENCOUNTER — Ambulatory Visit (INDEPENDENT_AMBULATORY_CARE_PROVIDER_SITE_OTHER): Payer: Medicare Other | Admitting: Family Medicine

## 2020-03-06 ENCOUNTER — Other Ambulatory Visit: Payer: Self-pay

## 2020-03-06 ENCOUNTER — Encounter: Payer: Self-pay | Admitting: Family Medicine

## 2020-03-06 VITALS — BP 136/65 | HR 60 | Temp 97.7°F | Ht 62.0 in | Wt 122.0 lb

## 2020-03-06 DIAGNOSIS — E782 Mixed hyperlipidemia: Secondary | ICD-10-CM | POA: Diagnosis not present

## 2020-03-06 DIAGNOSIS — K219 Gastro-esophageal reflux disease without esophagitis: Secondary | ICD-10-CM | POA: Diagnosis not present

## 2020-03-06 DIAGNOSIS — E039 Hypothyroidism, unspecified: Secondary | ICD-10-CM | POA: Diagnosis not present

## 2020-03-06 DIAGNOSIS — R350 Frequency of micturition: Secondary | ICD-10-CM

## 2020-03-06 DIAGNOSIS — E119 Type 2 diabetes mellitus without complications: Secondary | ICD-10-CM | POA: Diagnosis not present

## 2020-03-06 DIAGNOSIS — E559 Vitamin D deficiency, unspecified: Secondary | ICD-10-CM

## 2020-03-06 DIAGNOSIS — E038 Other specified hypothyroidism: Secondary | ICD-10-CM | POA: Diagnosis not present

## 2020-03-06 DIAGNOSIS — I10 Essential (primary) hypertension: Secondary | ICD-10-CM

## 2020-03-06 DIAGNOSIS — M810 Age-related osteoporosis without current pathological fracture: Secondary | ICD-10-CM

## 2020-03-06 LAB — BAYER DCA HB A1C WAIVED: HB A1C (BAYER DCA - WAIVED): 6 % (ref <7.0)

## 2020-03-06 MED ORDER — AMLODIPINE BESYLATE 5 MG PO TABS: 5.0000 mg | ORAL_TABLET | Freq: Every day | ORAL | 3 refills | Status: DC

## 2020-03-06 MED ORDER — OLMESARTAN MEDOXOMIL 40 MG PO TABS: 40.0000 mg | ORAL_TABLET | Freq: Every day | ORAL | 1 refills | Status: DC

## 2020-03-06 MED ORDER — ALENDRONATE SODIUM 70 MG PO TABS: 70.0000 mg | ORAL_TABLET | ORAL | 5 refills | Status: DC

## 2020-03-06 MED ORDER — SIMVASTATIN 40 MG PO TABS: ORAL_TABLET | ORAL | 1 refills | Status: DC

## 2020-03-06 MED ORDER — LEVOTHYROXINE SODIUM 75 MCG PO TABS: ORAL_TABLET | ORAL | 1 refills | Status: DC

## 2020-03-06 MED ORDER — METFORMIN HCL ER 500 MG PO TB24: ORAL_TABLET | ORAL | 3 refills | Status: DC

## 2020-03-06 MED ORDER — CARVEDILOL 25 MG PO TABS: ORAL_TABLET | ORAL | 1 refills | Status: DC

## 2020-03-06 MED ORDER — OMEPRAZOLE 40 MG PO CPDR: DELAYED_RELEASE_CAPSULE | ORAL | 1 refills | Status: DC

## 2020-03-06 MED ORDER — SOLIFENACIN SUCCINATE 10 MG PO TABS: 10.0000 mg | ORAL_TABLET | Freq: Every day | ORAL | 1 refills | Status: DC

## 2020-03-06 NOTE — Progress Notes (Signed)
Subjective:  Patient ID: Sophia [name], female    DOB: 11/29/1931  Age: 86 y.o. MRN: [id number]  CC: Follow-up (3 month)   HPI Sophia [name] presents for  follow-up of hypertension. Patient has no history of headache chest pain or shortness of breath or recent cough. Patient also denies symptoms of TIA such as focal numbness or weakness. Patient denies side effects from medication. States taking it regularly.   follow-up on  thyroid. The patient has a history of hypothyroidism for many years. It has been stable recently. Pt. denies any change in  voice, loss of hair, heat or cold intolerance. Energy level has been adequate to good. Patient denies constipation and diarrhea. No myxedema. Medication is as noted below. Verified that pt is taking it daily on an empty stomach. Well tolerated.   The scalp lesion that had been small and unremarkable at previous evaluation, she tells me now has grown rather rapidly she has been using several home remedies including applying vinegar to it etc.  It just seems to be getting worse and bigger.  History Sophia [name] has a past medical history of Chronic kidney disease, Diabetes mellitus without complication ([place]), Hyperlipidemia, and Hypertension.   She has a past surgical history that includes Cholecystectomy; Sigmoidoscopy; Esophagogastroduodenoscopy (N/A, 04/29/2015); and Eye surgery (Right, 01/19/2019).   Her family history includes Colon cancer in her sister; Heart disease in her brother, father, and sister.She reports that she has never smoked. She has never used smokeless tobacco. She reports that she does not drink alcohol and does not use drugs.  Current Outpatient Medications on File Prior to Visit  Medication Sig Dispense Refill  . blood glucose meter kit and supplies KIT Use up to 4x daily prn 1 each 0  . Calcium-Magnesium 500-250 MG TABS Take 1 tablet by mouth daily with supper. 30 each 5  . ferrous sulfate 325 (65 FE) MG tablet Take 1  tablet (325 mg total) by mouth daily with breakfast.    . VITAMIN D, ERGOCALCIFEROL, PO Take by mouth.     No current facility-administered medications on file prior to visit.    ROS Review of Systems  Constitutional: Negative.   HENT: Negative.   Eyes: Negative for visual disturbance.  Respiratory: Negative for shortness of breath.   Cardiovascular: Negative for chest pain.  Gastrointestinal: Negative for abdominal pain.  Musculoskeletal: Negative for arthralgias.    Objective:  BP 136/65   Pulse 60   Temp 97.7 F (36.5 C) (Temporal)   Ht _0  (1.575 m)   Wt 122 lb (55.3 kg)   BMI 22.31 kg/m   BP Readings from Last 3 Encounters:  03/06/20 136/65  12/04/19 132/64  08/29/19 137/67    Wt Readings from Last 3 Encounters:  03/06/20 122 lb (55.3 kg)  12/04/19 125 lb 4 oz (56.8 kg)  08/29/19 126 lb 8 oz (57.4 kg)     Physical Exam Constitutional:      General: She is not in acute distress.    Appearance: She is well-developed.  HENT:     Head: Normocephalic and atraumatic.  Eyes:     Conjunctiva/sclera: Conjunctivae normal.     Pupils: Pupils are equal, round, and reactive to light.  Neck:     Thyroid: No thyromegaly.  Cardiovascular:     Rate and Rhythm: Normal rate and regular rhythm.     Heart sounds: Normal heart sounds. No murmur heard.   Pulmonary:     Effort: Pulmonary effort is normal. No  respiratory distress.     Breath sounds: Normal breath sounds. No wheezing or rales.  Abdominal:     General: Bowel sounds are normal. There is no distension.     Palpations: Abdomen is soft.     Tenderness: There is no abdominal tenderness.  Musculoskeletal:        General: Normal range of motion.     Cervical back: Normal range of motion and neck supple.  Lymphadenopathy:     Cervical: No cervical adenopathy.  Skin:    General: Skin is warm and dry.     Findings: Lesion (frontal scalp lesion is 4 cm diameter, raised, hyperemic) present.  Neurological:      Mental Status: She is alert and oriented to person, place, and time.  Psychiatric:        Behavior: Behavior normal.        Thought Content: Thought content normal.        Judgment: Judgment normal.    .    Assessment & Plan:   Sophia [name] was seen today for follow-up.  Diagnoses and all orders for this visit:  Mixed hyperlipidemia -     CBC with Differential/Platelet -     CMP14+EGFR -     Lipid panel -     simvastatin (ZOCOR) 40 MG tablet; TAKE 1/2 (ONE-HALF) TABLET BY MOUTH ONCE DAILY AT  6PM  Essential (primary) hypertension -     CBC with Differential/Platelet -     CMP14+EGFR -     amLODipine (NORVASC) 5 MG tablet; Take 1 tablet (5 mg total) by mouth daily. -     carvedilol (COREG) 25 MG tablet; TAKE 1 TABLET BY MOUTH TWICE DAILY WITH A MEAL -     olmesartan (BENICAR) 40 MG tablet; Take 1 tablet (40 mg total) by mouth daily. (Please make 6 mos Aug appt)  Osteoporosis, unspecified osteoporosis type, unspecified pathological fracture presence -     CBC with Differential/Platelet -     CMP14+EGFR -     alendronate (FOSAMAX) 70 MG tablet; Take 1 tablet (70 mg total) by mouth every 7 (seven) days. Take with a full glass of water on an empty stomach.  Vitamin D deficiency -     CBC with Differential/Platelet -     CMP14+EGFR -     VITAMIN D 25 Hydroxy (Vit-D Deficiency, Fractures)  Diabetes mellitus without complication (HCC) -     CBC with Differential/Platelet -     CMP14+EGFR -     Bayer DCA Hb A1c Waived -     metFORMIN (GLUCOPHAGE-XR) 500 MG 24 hr tablet; Take 1 tablet by mouth once daily with breakfast -     olmesartan (BENICAR) 40 MG tablet; Take 1 tablet (40 mg total) by mouth daily. (Please make 6 mos Aug appt)  Hypothyroidism, unspecified type -     CBC with Differential/Platelet -     CMP14+EGFR -     Thyroid Panel With TSH  Other specified hypothyroidism -     levothyroxine (SYNTHROID) 75 MCG tablet; TAKE 1 TABLET BY MOUTH IN THE MORNING ON AN EMPTY  STOMACH WAIT  ONE  HOUR  BEFORE  EATING  Gastroesophageal reflux disease without esophagitis -     omeprazole (PRILOSEC) 40 MG capsule; TAKE 1 CAPSULE ON AN EMPTY STOMACH 1 HOUR BEFORE BREAKFAST AND ANOTHER AT BEDTIME (2 HOURS AFTER YOUR LAST FOOD AND DRINK EXCEPT WATER)  Urine frequency -     solifenacin (VESICARE) 10 MG tablet; Take 1 tablet (  10 mg total) by mouth daily. For urinary control   Allergies as of 03/06/2020   No Known Allergies     Medication List       Accurate as of March 06, 2020  6:29 PM. If you have any questions, ask your nurse or doctor.        alendronate 70 MG tablet Commonly known as: FOSAMAX Take 1 tablet (70 mg total) by mouth every 7 (seven) days. Take with a full glass of water on an empty stomach.   amLODipine 5 MG tablet Commonly known as: NORVASC Take 1 tablet (5 mg total) by mouth daily.   blood glucose meter kit and supplies Kit Use up to 4x daily prn   Calcium-Magnesium 500-250 MG Tabs Take 1 tablet by mouth daily with supper.   carvedilol 25 MG tablet Commonly known as: COREG TAKE 1 TABLET BY MOUTH TWICE DAILY WITH A MEAL   ferrous sulfate 325 (65 FE) MG tablet Take 1 tablet (325 mg total) by mouth daily with breakfast.   levothyroxine 75 MCG tablet Commonly known as: SYNTHROID TAKE 1 TABLET BY MOUTH IN THE MORNING ON AN EMPTY STOMACH WAIT  ONE  HOUR  BEFORE  EATING   metFORMIN 500 MG 24 hr tablet Commonly known as: GLUCOPHAGE-XR Take 1 tablet by mouth once daily with breakfast   olmesartan 40 MG tablet Commonly known as: BENICAR Take 1 tablet (40 mg total) by mouth daily. (Please make 6 mos Aug appt)   omeprazole 40 MG capsule Commonly known as: PRILOSEC TAKE 1 CAPSULE ON AN EMPTY STOMACH 1 HOUR BEFORE BREAKFAST AND ANOTHER AT BEDTIME (2 HOURS AFTER YOUR LAST FOOD AND DRINK EXCEPT WATER)   simvastatin 40 MG tablet Commonly known as: ZOCOR TAKE 1/2 (ONE-HALF) TABLET BY MOUTH ONCE DAILY AT  6PM   solifenacin 10 MG  tablet Commonly known as: VESICARE Take 1 tablet (10 mg total) by mouth daily. For urinary control   VITAMIN D (ERGOCALCIFEROL) PO Take by mouth.       Meds ordered this encounter  Medications  . alendronate (FOSAMAX) 70 MG tablet    Sig: Take 1 tablet (70 mg total) by mouth every 7 (seven) days. Take with a full glass of water on an empty stomach.    Dispense:  4 tablet    Refill:  5  . amLODipine (NORVASC) 5 MG tablet    Sig: Take 1 tablet (5 mg total) by mouth daily.    Dispense:  90 tablet    Refill:  3  . carvedilol (COREG) 25 MG tablet    Sig: TAKE 1 TABLET BY MOUTH TWICE DAILY WITH A MEAL    Dispense:  180 tablet    Refill:  1    Please consider 90 day supplies to promote better adherence  . levothyroxine (SYNTHROID) 75 MCG tablet    Sig: TAKE 1 TABLET BY MOUTH IN THE MORNING ON AN EMPTY STOMACH WAIT  ONE  HOUR  BEFORE  EATING    Dispense:  90 tablet    Refill:  1  . metFORMIN (GLUCOPHAGE-XR) 500 MG 24 hr tablet    Sig: Take 1 tablet by mouth once daily with breakfast    Dispense:  90 tablet    Refill:  3  . olmesartan (BENICAR) 40 MG tablet    Sig: Take 1 tablet (40 mg total) by mouth daily. (Please make 6 mos Aug appt)    Dispense:  90 tablet    Refill:  1  .  omeprazole (PRILOSEC) 40 MG capsule    Sig: TAKE 1 CAPSULE ON AN EMPTY STOMACH 1 HOUR BEFORE BREAKFAST AND ANOTHER AT BEDTIME (2 HOURS AFTER YOUR LAST FOOD AND DRINK EXCEPT WATER)    Dispense:  180 capsule    Refill:  1  . simvastatin (ZOCOR) 40 MG tablet    Sig: TAKE 1/2 (ONE-HALF) TABLET BY MOUTH ONCE DAILY AT  6PM    Dispense:  90 tablet    Refill:  1  . solifenacin (VESICARE) 10 MG tablet    Sig: Take 1 tablet (10 mg total) by mouth daily. For urinary control    Dispense:  90 tablet    Refill:  1      Follow-up: Return in about 6 months (around 09/04/2020).  Claretta Fraise, M.D.

## 2020-03-07 LAB — CMP14+EGFR
ALT: 9 IU/L (ref 0–32)
AST: 14 IU/L (ref 0–40)
Albumin/Globulin Ratio: 1.8 (ref 1.2–2.2)
Albumin: 4 g/dL (ref 3.6–4.6)
Alkaline Phosphatase: 58 IU/L (ref 44–121)
BUN/Creatinine Ratio: 16 (ref 12–28)
BUN: 16 mg/dL (ref 8–27)
Bilirubin Total: 0.3 mg/dL (ref 0.0–1.2)
CO2: 23 mmol/L (ref 20–29)
Calcium: 9.1 mg/dL (ref 8.7–10.3)
Chloride: 106 mmol/L (ref 96–106)
Creatinine, Ser: 1.03 mg/dL — ABNORMAL HIGH (ref 0.57–1.00)
GFR calc Af Amer: 57 mL/min/{1.73_m2} — ABNORMAL LOW (ref >59)
GFR calc non Af Amer: 50 mL/min/{1.73_m2} — ABNORMAL LOW (ref >59)
Globulin, Total: 2.2 g/dL (ref 1.5–4.5)
Glucose: 146 mg/dL — ABNORMAL HIGH (ref 65–99)
Potassium: 4 mmol/L (ref 3.5–5.2)
Sodium: 142 mmol/L (ref 134–144)
Total Protein: 6.2 g/dL (ref 6.0–8.5)

## 2020-03-07 LAB — CBC WITH DIFFERENTIAL/PLATELET
Basophils Absolute: 0.1 10*3/uL (ref 0.0–0.2)
Basos: 1 %
EOS (ABSOLUTE): 0.3 10*3/uL (ref 0.0–0.4)
Eos: 4 %
Hematocrit: 34.8 % (ref 34.0–46.6)
Hemoglobin: 11.9 g/dL (ref 11.1–15.9)
Immature Grans (Abs): 0 10*3/uL (ref 0.0–0.1)
Immature Granulocytes: 0 %
Lymphocytes Absolute: 1.8 10*3/uL (ref 0.7–3.1)
Lymphs: 27 %
MCH: 34.1 pg — ABNORMAL HIGH (ref 26.6–33.0)
MCHC: 34.2 g/dL (ref 31.5–35.7)
MCV: 100 fL — ABNORMAL HIGH (ref 79–97)
Monocytes Absolute: 0.4 10*3/uL (ref 0.1–0.9)
Monocytes: 6 %
Neutrophils Absolute: 3.9 10*3/uL (ref 1.4–7.0)
Neutrophils: 62 %
Platelets: 251 10*3/uL (ref 150–450)
RBC: 3.49 x10E6/uL — ABNORMAL LOW (ref 3.77–5.28)
RDW: 12.4 % (ref 11.7–15.4)
WBC: 6.5 10*3/uL (ref 3.4–10.8)

## 2020-03-07 LAB — THYROID PANEL WITH TSH
Free Thyroxine Index: 2.5 (ref 1.2–4.9)
T3 Uptake Ratio: 29 % (ref 24–39)
T4, Total: 8.5 ug/dL (ref 4.5–12.0)
TSH: 0.616 u[IU]/mL (ref 0.450–4.500)

## 2020-03-07 LAB — LIPID PANEL
Chol/HDL Ratio: 3.3 ratio (ref 0.0–4.4)
Cholesterol, Total: 179 mg/dL (ref 100–199)
HDL: 54 mg/dL (ref >39)
LDL Chol Calc (NIH): 75 mg/dL (ref 0–99)
Triglycerides: 315 mg/dL — ABNORMAL HIGH (ref 0–149)
VLDL Cholesterol Cal: 50 mg/dL — ABNORMAL HIGH (ref 5–40)

## 2020-03-07 LAB — VITAMIN D 25 HYDROXY (VIT D DEFICIENCY, FRACTURES): Vit D, 25-Hydroxy: 41 ng/mL (ref 30.0–100.0)

## 2020-03-07 NOTE — Progress Notes (Signed)
Hello Barbara,  Your lab result is normal and/or stable.Some minor variations that are not significant are commonly marked abnormal, but do not represent any medical problem for you.  Best regards, Lygia Olaes, M.D.

## 2020-03-13 ENCOUNTER — Ambulatory Visit: Payer: Medicare Other | Admitting: Licensed Clinical Social Worker

## 2020-03-13 DIAGNOSIS — R739 Hyperglycemia, unspecified: Secondary | ICD-10-CM

## 2020-03-13 DIAGNOSIS — E559 Vitamin D deficiency, unspecified: Secondary | ICD-10-CM

## 2020-03-13 DIAGNOSIS — N182 Chronic kidney disease, stage 2 (mild): Secondary | ICD-10-CM

## 2020-03-13 DIAGNOSIS — E782 Mixed hyperlipidemia: Secondary | ICD-10-CM

## 2020-03-13 DIAGNOSIS — I10 Essential (primary) hypertension: Secondary | ICD-10-CM

## 2020-03-13 DIAGNOSIS — E119 Type 2 diabetes mellitus without complications: Secondary | ICD-10-CM

## 2020-03-13 DIAGNOSIS — M810 Age-related osteoporosis without current pathological fracture: Secondary | ICD-10-CM

## 2020-03-13 NOTE — Chronic Care Management (AMB) (Signed)
  Chronic Care Management    Clinical Social Work Follow Up Note  03/13/2020 Name: Sophia [name] MRN: [id number] DOB: [birth year]  Sophia [name] is a 86 y.o. year old female who is a primary care patient of [name], [name], MD. The CCM team was consulted for assistance with [name]   Review of patient status, including review of consultants reports, other relevant assessments, and collaboration with appropriate care team members and the patient's provider was performed as part of comprehensive patient evaluation and provision of chronic care management services.    SDOH (Social Determinants of Health) assessments performed: No; risk for depression; risk for tobacco use; risk for stress; risk for physical inactivity    Chronic Care Management from 12/29/2019 in [place]  PHQ-9 Total Score 2       GAD 7 : Generalized Anxiety Score 12/29/2019 04/26/2015  Nervous, Anxious, on Edge 0 1  Control/stop worrying 0 1  Worry too much - different things 0 1  Trouble relaxing 0 1  Restless 0 0  Easily annoyed or irritable 0 2  Afraid - awful might happen 0 1  Total GAD 7 Score 0 7  Anxiety Difficulty Somewhat difficult Not difficult at all    Outpatient Encounter Medications as of 03/13/2020  Medication Sig  . alendronate (FOSAMAX) 70 MG tablet Take 1 tablet (70 mg total) by mouth every 7 (seven) days. Take with a full glass of water on an empty stomach.  Marland Kitchen amLODipine (NORVASC) 5 MG tablet Take 1 tablet (5 mg total) by mouth daily.  . blood glucose meter kit and supplies KIT Use up to 4x daily prn  . Calcium-Magnesium 500-250 MG TABS Take 1 tablet by mouth daily with supper.  . carvedilol (COREG) 25 MG tablet TAKE 1 TABLET BY MOUTH TWICE DAILY WITH A MEAL  . ferrous sulfate 325 (65 FE) MG tablet Take 1 tablet (325 mg total) by mouth daily with breakfast.  . levothyroxine (SYNTHROID) 75 MCG tablet TAKE 1 TABLET BY MOUTH IN THE MORNING ON AN EMPTY  STOMACH WAIT  ONE  HOUR  BEFORE  EATING  . metFORMIN (GLUCOPHAGE-XR) 500 MG 24 hr tablet Take 1 tablet by mouth once daily with breakfast  . olmesartan (BENICAR) 40 MG tablet Take 1 tablet (40 mg total) by mouth daily. (Please make 6 mos Aug appt)  . omeprazole (PRILOSEC) 40 MG capsule TAKE 1 CAPSULE ON AN EMPTY STOMACH 1 HOUR BEFORE BREAKFAST AND ANOTHER AT BEDTIME (2 HOURS AFTER YOUR LAST FOOD AND DRINK EXCEPT WATER)  . simvastatin (ZOCOR) 40 MG tablet TAKE 1/2 (ONE-HALF) TABLET BY MOUTH ONCE DAILY AT  6PM  . solifenacin (VESICARE) 10 MG tablet Take 1 tablet (10 mg total) by mouth daily. For urinary control  . VITAMIN D, ERGOCALCIFEROL, PO Take by mouth.   No facility-administered encounter medications on file as of 03/13/2020.    [name] collaborated with [employer] via phone today about nursing needs of client.  [name] called client home phone and cell phone numbers several times today but [name] was not able to speak via phone with client today. [name] did leave phone message requesting that client please return call to [name] at [phone]  Follow Up Plan: [name] to call client in next 4 weeks to discuss her management of health needs faced and to discuss her completion of daily activities  [name].Forrest MSW, [name] Licensed Clinical Social Worker [facility] Care Management [phone]

## 2020-03-13 NOTE — Patient Instructions (Addendum)
Licensed Clinical Social Worker Visit Information  Materials Provided: No  03/13/2020  Name: Sophia [name]    MRN: [id number]       DOB: 05-23-1931  Sophia [name] is a 86 y.o. year old female who is a primary care patient of [name], [name], MD. The CCM team was consulted for assistance with [name]   Review of patient status, including review of consultants reports, other relevant assessments, and collaboration with appropriate care team members and the patient's provider was performed as part of comprehensive patient evaluation and provision of chronic care management services.    SDOH (Social Determinants of Health) assessments performed: No; risk for depression; risk for tobacco use; risk for stress; risk for physical inactivity  [name] collaborated with [employer] via phone today about nursing needs of client.  [name] called client home phone and cell phone numbers several times today but [name] was not able to speak via phone with client today. [name] did leave phone message requesting that client please return call to [name] at [phone]  Follow Up Plan: [name] to call client in next 4 weeks to discuss her management of health needs faced and to discuss her completion of daily activities  [name] was not able to speak via phone with client today; thus, the client was not able to verbalize understanding of instructions provided today and was not able to accept or decline  a print copy of patient instruction materials.    [name].Heaton Sarin MSW, [name] Licensed Clinical Social Worker [facility] Care Management [phone]

## 2020-03-20 ENCOUNTER — Ambulatory Visit: Payer: Self-pay | Admitting: Licensed Clinical Social Worker

## 2020-03-20 ENCOUNTER — Other Ambulatory Visit: Payer: Self-pay | Admitting: Family Medicine

## 2020-03-20 ENCOUNTER — Telehealth: Payer: Self-pay | Admitting: *Deleted

## 2020-03-20 DIAGNOSIS — L989 Disorder of the skin and subcutaneous tissue, unspecified: Secondary | ICD-10-CM

## 2020-03-20 DIAGNOSIS — M810 Age-related osteoporosis without current pathological fracture: Secondary | ICD-10-CM

## 2020-03-20 DIAGNOSIS — R739 Hyperglycemia, unspecified: Secondary | ICD-10-CM

## 2020-03-20 DIAGNOSIS — N182 Chronic kidney disease, stage 2 (mild): Secondary | ICD-10-CM

## 2020-03-20 DIAGNOSIS — E119 Type 2 diabetes mellitus without complications: Secondary | ICD-10-CM

## 2020-03-20 DIAGNOSIS — E782 Mixed hyperlipidemia: Secondary | ICD-10-CM

## 2020-03-20 DIAGNOSIS — I10 Essential (primary) hypertension: Secondary | ICD-10-CM

## 2020-03-20 DIAGNOSIS — E559 Vitamin D deficiency, unspecified: Secondary | ICD-10-CM

## 2020-03-20 NOTE — Chronic Care Management (AMB) (Signed)
Chronic Care Management    Clinical Social Work Follow Up Note  03/20/2020 Name: Sophia [name] MRN: [id number] DOB: 1932-03-12  Sophia [name] is a 86 y.o. year old female who is a primary care patient of [name], [name], MD. The CCM team was consulted for assistance with [name]   Review of patient status, including review of consultants reports, other relevant assessments, and collaboration with appropriate care team members and the patient's provider was performed as part of comprehensive patient evaluation and provision of chronic care management services.    SDOH (Social Determinants of Health) assessments performed: No; risk for depression; risk for tobacco use; risk for stress; risk for physical inactivity    Chronic Care Management from 12/29/2019 in [place]  PHQ-9 Total Score 2       GAD 7 : Generalized Anxiety Score 12/29/2019 04/26/2015  Nervous, Anxious, on Edge 0 1  Control/stop worrying 0 1  Worry too much - different things 0 1  Trouble relaxing 0 1  Restless 0 0  Easily annoyed or irritable 0 2  Afraid - awful might happen 0 1  Total GAD 7 Score 0 7  Anxiety Difficulty Somewhat difficult Not difficult at all    Outpatient Encounter Medications as of 03/20/2020  Medication Sig  . alendronate (FOSAMAX) 70 MG tablet Take 1 tablet (70 mg total) by mouth every 7 (seven) days. Take with a full glass of water on an empty stomach.  Marland Kitchen amLODipine (NORVASC) 5 MG tablet Take 1 tablet (5 mg total) by mouth daily.  . blood glucose meter kit and supplies KIT Use up to 4x daily prn  . Calcium-Magnesium 500-250 MG TABS Take 1 tablet by mouth daily with supper.  . carvedilol (COREG) 25 MG tablet TAKE 1 TABLET BY MOUTH TWICE DAILY WITH A MEAL  . ferrous sulfate 325 (65 FE) MG tablet Take 1 tablet (325 mg total) by mouth daily with breakfast.  . levothyroxine (SYNTHROID) 75 MCG tablet TAKE 1 TABLET BY MOUTH IN THE MORNING ON AN EMPTY  STOMACH WAIT  ONE  HOUR  BEFORE  EATING  . metFORMIN (GLUCOPHAGE-XR) 500 MG 24 hr tablet Take 1 tablet by mouth once daily with breakfast  . olmesartan (BENICAR) 40 MG tablet Take 1 tablet (40 mg total) by mouth daily. (Please make 6 mos Aug appt)  . omeprazole (PRILOSEC) 40 MG capsule TAKE 1 CAPSULE ON AN EMPTY STOMACH 1 HOUR BEFORE BREAKFAST AND ANOTHER AT BEDTIME (2 HOURS AFTER YOUR LAST FOOD AND DRINK EXCEPT WATER)  . simvastatin (ZOCOR) 40 MG tablet TAKE 1/2 (ONE-HALF) TABLET BY MOUTH ONCE DAILY AT  6PM  . solifenacin (VESICARE) 10 MG tablet Take 1 tablet (10 mg total) by mouth daily. For urinary control  . VITAMIN D, ERGOCALCIFEROL, PO Take by mouth.   No facility-administered encounter medications on file as of 03/20/2020.     [name] received phone call from client today. Client asked about skin care issue and spoke about her last appointment with Dr. Livia Snellen. Client spoke of skin care lesion on her head. She said she had talked with Dr. Livia Snellen about issue and was not sure if a dermatology appointment had been made for client. [name] informed client that [name] would convey this information to [facility] today.  Client agreed to plan. [name] then called RN Chong Sicilian today and spoke with her about skin concern issues of client  Follow Up Plan: [name] to call client as scheduled to assess client needs  at that time  [name].Aalaiyah Yassin MSW, [name] Licensed Clinical Social Worker [facility] Care Management [phone]

## 2020-03-20 NOTE — Telephone Encounter (Signed)
03/20/2020  Patient reached out to [employer], [name] today regarding her scalp lesion. She was evaluated by Dr Livia Snellen on 03/06/20 and thought he was going to order a dermatology referral for her to see someone in [place]. She is concerned and feels like the area is getting worse. She would like for someone to give her a call back about the lesion and the referral.   Forwarding to [facility] clinical staff for review and outreach.   Chong Sicilian, BSN, RN-BC Embedded Chronic Care Manager [facility] / [place] Management Direct Dial: [phone]

## 2020-03-20 NOTE — Telephone Encounter (Signed)
Thanks for the heads up I didn't realize that, although I wrote that she needed the referral, I failed to send it in. Please offer her my apology. I sent it just now.

## 2020-03-20 NOTE — Patient Instructions (Addendum)
Licensed Clinical Social Worker Visit Information  Materials Provided: No  03/20/2020  Name: Sophia [name]    MRN: [id number]       DOB: 06-19-1931  Sophia [name] is a 86 y.o. year old female who is a primary care patient of [name], [name], MD. The CCM team was consulted for assistance with [name]   Review of patient status, including review of consultants reports, other relevant assessments, and collaboration with appropriate care team members and the patient's provider was performed as part of comprehensive patient evaluation and provision of chronic care management services.    SDOH (Social Determinants of Health) assessments performed: No; risk for depression; risk for tobacco use; risk for stress; risk for physical inactivity  [name] received phone call from client today. Client asked about skin care issue and spoke about her last appointment with Dr. Livia Snellen. Client spoke of skin care lesion on her head. She said she had talked with Dr. Livia Snellen about issue and was not sure if a dermatology appointment had been made for client. [name] informed client that [name] would convey this information to [facility] today.  Client agreed to plan. [name] then called RN Chong Sicilian today and spoke with her about skin concern issues of client  Follow Up Plan: [name] to call client as scheduled to assess client needs at that time  The patient verbalized understanding of instructions provided today and declined a print copy of patient instruction materials.   [name].Denney Shein MSW, [name] Licensed Clinical Social Worker [facility] Care Management [phone]

## 2020-03-25 NOTE — Telephone Encounter (Signed)
Patient aware and verbalizes understanding. 

## 2020-03-25 NOTE — Telephone Encounter (Signed)
Lmtcb.

## 2020-03-27 ENCOUNTER — Ambulatory Visit: Payer: Medicare Other | Admitting: *Deleted

## 2020-03-27 DIAGNOSIS — L989 Disorder of the skin and subcutaneous tissue, unspecified: Secondary | ICD-10-CM

## 2020-03-27 NOTE — Chronic Care Management (AMB) (Signed)
  Chronic Care Management   Note  03/27/2020 Name: Sophia [name] MRN: [id number] DOB: 12/26/1931  Consulted by Theadore Nan, [name] regarding [name]'s referral for a skin lesion on her scalp. Chart reviewed and referral was placed and outreach was attempted by [facility] on 03/22/20 but they were unable to reach patient.   I reached out to [name] and explained the referral details and provider her with a contact number for [facility], [phone], so that she can give them a call back to schedule her initial visit.   Chart review shows that patient is now scheduled for 04/04/20 at [facility].   Follow up plan: The care management team will reach out to the patient again over the next 30 days.  The patient has been provided with contact information for the care management team and has been advised to call with any health related questions or concerns.   Chong Sicilian, BSN, RN-BC Embedded Chronic Care Manager [facility] / [place] Management Direct Dial: [phone]

## 2020-03-27 NOTE — Patient Instructions (Signed)
Plan Keep appt with [facility] on 04/04/20 Reach out to [place] as needed CCM team will f/u by telephone over the next 30 days.   Chong Sicilian, BSN, RN-BC Embedded Chronic Care Manager [facility] / [place] [facility] Direct Dial: [phone]

## 2020-04-04 ENCOUNTER — Other Ambulatory Visit: Payer: Self-pay

## 2020-04-04 ENCOUNTER — Ambulatory Visit (INDEPENDENT_AMBULATORY_CARE_PROVIDER_SITE_OTHER): Payer: Medicare Other | Admitting: Plastic Surgery

## 2020-04-04 VITALS — BP 157/66 | HR 57 | Temp 98.3°F | Ht 62.0 in | Wt 122.0 lb

## 2020-04-04 DIAGNOSIS — L989 Disorder of the skin and subcutaneous tissue, unspecified: Secondary | ICD-10-CM

## 2020-04-04 NOTE — Progress Notes (Signed)
Referring Provider Claretta Fraise, MD [place],  [place] [address]   CC:  Chief Complaint  Patient presents with  . Consult      Sophia [name] is an 86 y.o. female.  HPI: Patient presents with a symptomatic lesion of her scalp.  Is been present for over a year.  It started off very small and was thought to be a wart but has gradually increased in size significantly.  It is now about 4 to 5 cm in its largest diameter.  It is also growing more exophytic.  It has not bled but it does have a very irregular surface.  The patient is interested in having it removed.  Has not been biopsied  No Known Allergies  Outpatient Encounter Medications as of 04/04/2020  Medication Sig  . alendronate (FOSAMAX) 70 MG tablet Take 1 tablet (70 mg total) by mouth every 7 (seven) days. Take with a full glass of water on an empty stomach.  Marland Kitchen amLODipine (NORVASC) 5 MG tablet Take 1 tablet (5 mg total) by mouth daily.  . blood glucose meter kit and supplies KIT Use up to 4x daily prn  . Calcium-Magnesium 500-250 MG TABS Take 1 tablet by mouth daily with supper.  . carvedilol (COREG) 25 MG tablet TAKE 1 TABLET BY MOUTH TWICE DAILY WITH A MEAL  . ferrous sulfate 325 (65 FE) MG tablet Take 1 tablet (325 mg total) by mouth daily with breakfast.  . levothyroxine (SYNTHROID) 75 MCG tablet TAKE 1 TABLET BY MOUTH IN THE MORNING ON AN EMPTY STOMACH WAIT  ONE  HOUR  BEFORE  EATING  . metFORMIN (GLUCOPHAGE-XR) 500 MG 24 hr tablet Take 1 tablet by mouth once daily with breakfast  . olmesartan (BENICAR) 40 MG tablet Take 1 tablet (40 mg total) by mouth daily. (Please make 6 mos Aug appt)  . omeprazole (PRILOSEC) 40 MG capsule TAKE 1 CAPSULE ON AN EMPTY STOMACH 1 HOUR BEFORE BREAKFAST AND ANOTHER AT BEDTIME (2 HOURS AFTER YOUR LAST FOOD AND DRINK EXCEPT WATER)  . simvastatin (ZOCOR) 40 MG tablet TAKE 1/2 (ONE-HALF) TABLET BY MOUTH ONCE DAILY AT  6PM  . solifenacin (VESICARE) 10 MG tablet Take 1 tablet (10 mg  total) by mouth daily. For urinary control  . VITAMIN D, ERGOCALCIFEROL, PO Take by mouth.   No facility-administered encounter medications on file as of 04/04/2020.     Past Medical History:  Diagnosis Date  . Chronic kidney disease   . Diabetes mellitus without complication ([place])   . Hyperlipidemia   . Hypertension     Past Surgical History:  Procedure Laterality Date  . CHOLECYSTECTOMY    . ESOPHAGOGASTRODUODENOSCOPY N/A 04/29/2015   Procedure: ESOPHAGOGASTRODUODENOSCOPY (EGD);  Surgeon: Clarene Essex, MD;  Location: [facility] ENDOSCOPY;  Service: Endoscopy;  Laterality: N/A;  . EYE SURGERY Right 01/19/2019   blocked tear duct   . SIGMOIDOSCOPY      Family History  Problem Relation Age of Onset  . Heart disease Father   . Colon cancer Sister   . Heart disease Brother   . Heart disease Sister     Social History   Social History Narrative  . Not on file     Review of Systems General: Denies fevers, chills, weight loss CV: Denies chest pain, shortness of breath, palpitations  Physical Exam Vitals with BMI 04/04/2020 03/06/2020 03/06/2020  Height '5\' 2"'  - '5\' 2"'   Weight 122 lbs - 122 lbs  BMI 36.46 - 80.32  Systolic 122  224 497  Diastolic 66 65 69  Pulse 57 60 57    General:  No acute distress,  Alert and oriented, Non-Toxic, Normal speech and affect Examination of the scalp shows a 4 cm irregular appearing lesion.  It has a scaly surface but fairly well-defined borders.  Its pigmentation is irregular.  Assessment/Plan Patient presents with a concerning lesion in the frontal scalp approaching the vertex.  As a part I recommended punch biopsy of this area to get a more clear picture of the diagnosis.  I would expect that it would ultimately require excision with the possibility of the flap closure due to its size.  We will plan to move forward with scheduling the biopsy and then make a more accurate planned after that information is available.  All her questions were  answered.  Cindra Presume 04/04/2020, 2:16 PM

## 2020-04-15 ENCOUNTER — Ambulatory Visit: Payer: Medicare Other | Admitting: Licensed Clinical Social Worker

## 2020-04-15 DIAGNOSIS — R739 Hyperglycemia, unspecified: Secondary | ICD-10-CM

## 2020-04-15 DIAGNOSIS — M810 Age-related osteoporosis without current pathological fracture: Secondary | ICD-10-CM

## 2020-04-15 DIAGNOSIS — I10 Essential (primary) hypertension: Secondary | ICD-10-CM

## 2020-04-15 DIAGNOSIS — N182 Chronic kidney disease, stage 2 (mild): Secondary | ICD-10-CM

## 2020-04-15 DIAGNOSIS — E119 Type 2 diabetes mellitus without complications: Secondary | ICD-10-CM

## 2020-04-15 DIAGNOSIS — E782 Mixed hyperlipidemia: Secondary | ICD-10-CM

## 2020-04-15 DIAGNOSIS — E559 Vitamin D deficiency, unspecified: Secondary | ICD-10-CM

## 2020-04-15 NOTE — Chronic Care Management (AMB) (Signed)
  Chronic Care Management    Clinical Social Work Follow Up Note  04/15/2020 Name: Sophia [name] MRN: [id number] DOB: May 09, 1932  Sophia [name] is a 86 y.o. year old female who is a primary care patient of [name], [name], MD. The CCM team was consulted for assistance with [name]   Review of patient status, including review of consultants reports, other relevant assessments, and collaboration with appropriate care team members and the patient's provider was performed as part of comprehensive patient evaluation and provision of chronic care management services.    SDOH (Social Determinants of Health) assessments performed: No; risk for depression; risk for tobacco use; risk for stress; risk for physical inactivity    Chronic Care Management from 12/29/2019 in [place]  PHQ-9 Total Score 2     GAD 7 : Generalized Anxiety Score 12/29/2019 04/26/2015  Nervous, Anxious, on Edge 0 1  Control/stop worrying 0 1  Worry too much - different things 0 1  Trouble relaxing 0 1  Restless 0 0  Easily annoyed or irritable 0 2  Afraid - awful might happen 0 1  Total GAD 7 Score 0 7  Anxiety Difficulty Somewhat difficult Not difficult at all    Outpatient Encounter Medications as of 04/15/2020  Medication Sig  . alendronate (FOSAMAX) 70 MG tablet Take 1 tablet (70 mg total) by mouth every 7 (seven) days. Take with a full glass of water on an empty stomach.  Marland Kitchen amLODipine (NORVASC) 5 MG tablet Take 1 tablet (5 mg total) by mouth daily.  . blood glucose meter kit and supplies KIT Use up to 4x daily prn  . Calcium-Magnesium 500-250 MG TABS Take 1 tablet by mouth daily with supper.  . carvedilol (COREG) 25 MG tablet TAKE 1 TABLET BY MOUTH TWICE DAILY WITH A MEAL  . ferrous sulfate 325 (65 FE) MG tablet Take 1 tablet (325 mg total) by mouth daily with breakfast.  . levothyroxine (SYNTHROID) 75 MCG tablet TAKE 1 TABLET BY MOUTH IN THE MORNING ON AN EMPTY  STOMACH WAIT  ONE  HOUR  BEFORE  EATING  . metFORMIN (GLUCOPHAGE-XR) 500 MG 24 hr tablet Take 1 tablet by mouth once daily with breakfast  . olmesartan (BENICAR) 40 MG tablet Take 1 tablet (40 mg total) by mouth daily. (Please make 6 mos Aug appt)  . omeprazole (PRILOSEC) 40 MG capsule TAKE 1 CAPSULE ON AN EMPTY STOMACH 1 HOUR BEFORE BREAKFAST AND ANOTHER AT BEDTIME (2 HOURS AFTER YOUR LAST FOOD AND DRINK EXCEPT WATER)  . simvastatin (ZOCOR) 40 MG tablet TAKE 1/2 (ONE-HALF) TABLET BY MOUTH ONCE DAILY AT  6PM  . solifenacin (VESICARE) 10 MG tablet Take 1 tablet (10 mg total) by mouth daily. For urinary control  . VITAMIN D, ERGOCALCIFEROL, PO Take by mouth.   No facility-administered encounter medications on file as of 04/15/2020.    [name] called home phone number for client several times today; however, [name] was not able to speak via phone with client today; [name] did leave phone message for client requesting she return call to [name] at [phone]  Follow Up Plan: [name] to call client in next 4 weeks to assess social work needs of client at that time   [name].Larone Kliethermes MSW, [name] Licensed Clinical Social Worker [facility] Care Management [phone]

## 2020-04-15 NOTE — Patient Instructions (Addendum)
Licensed Clinical Social Worker Visit Information  Materials Provided: No  04/15/2020  Name: Viktoria Gruetzmacher    MRN: [id number]       DOB: [birth year]  Graceyn Fodor is a 86 y.o. year old female who is a primary care patient of [name], [name], MD. The CCM team was consulted for assistance with [name]   Review of patient status, including review of consultants reports, other relevant assessments, and collaboration with appropriate care team members and the patient's provider was performed as part of comprehensive patient evaluation and provision of chronic care management services.    SDOH (Social Determinants of Health) assessments performed: No; risk for depression; risk for tobacco use; risk for stress; risk for physical inactivity  [name] called home phone number for client several times today; however, [name] was not able to speak via phone with client today; [name] did leave phone message for client requesting she return call to [name] at [phone]  Follow Up Plan: [name] to call client in next 4 weeks to assess social work needs of client at that time   [name] was not able to speak via phone with client today; thus the client was not able to verbalize understanding of instructions provided today and was not able to accept or decline a print copy of patient instruction materials.   [name].Corinne Goucher MSW, [name] Licensed Clinical Social Worker [facility] Care Management [phone]

## 2020-04-24 ENCOUNTER — Other Ambulatory Visit: Payer: Self-pay

## 2020-04-24 ENCOUNTER — Ambulatory Visit (INDEPENDENT_AMBULATORY_CARE_PROVIDER_SITE_OTHER): Payer: Medicare Other | Admitting: Plastic Surgery

## 2020-04-24 ENCOUNTER — Encounter: Payer: Self-pay | Admitting: Plastic Surgery

## 2020-04-24 ENCOUNTER — Other Ambulatory Visit (HOSPITAL_COMMUNITY)
Admission: RE | Admit: 2020-04-24 | Discharge: 2020-04-24 | Disposition: A | Discharge status: Home / Self Care | Payer: Medicare Other | Source: Ambulatory Visit | Attending: Plastic Surgery | Admitting: Plastic Surgery

## 2020-04-24 VITALS — BP 124/79 | HR 78 | Temp 97.6°F

## 2020-04-24 DIAGNOSIS — C4442 Squamous cell carcinoma of skin of scalp and neck: Secondary | ICD-10-CM | POA: Diagnosis not present

## 2020-04-24 DIAGNOSIS — L989 Disorder of the skin and subcutaneous tissue, unspecified: Secondary | ICD-10-CM | POA: Diagnosis not present

## 2020-04-24 NOTE — Progress Notes (Signed)
Operative Note   DATE OF OPERATION: 04/24/2020  LOCATION:    SURGICAL DEPARTMENT: Plastic Surgery  PREOPERATIVE DIAGNOSES:  Scalp lesion  POSTOPERATIVE DIAGNOSES:  same  PROCEDURE:  1. Punch biopsy scalp lesion  SURGEON: Talmadge Coventry, MD  ANESTHESIA:  Local  COMPLICATIONS: None.   INDICATIONS FOR PROCEDURE:  The patient, Sophia [name] is a 86 y.o. female born on 07/12/1931, is here for treatment of scalp lesion. MRN: [id number]  CONSENT:  Informed consent was obtained directly from the patient. Risks, benefits and alternatives were fully discussed. Specific risks including but not limited to bleeding, infection, hematoma, seroma, scarring, pain, infection, wound healing problems, and need for further surgery were all discussed. The patient did have an ample opportunity to have questions answered to satisfaction.   DESCRIPTION OF PROCEDURE:  Local anesthesia was administered. The patient's operative site was prepped and draped in a sterile fashion. A time out was performed and all information was confirmed to be correct.  A 32mm punch was used to take a portion of the specimen near the edge on the right side.  4-0 monocryl was placed for closure.  The patient tolerated the procedure well.  There were no complications.

## 2020-04-25 LAB — SURGICAL PATHOLOGY

## 2020-04-30 ENCOUNTER — Telehealth: Payer: Self-pay

## 2020-04-30 NOTE — Telephone Encounter (Signed)
Patient's daughter returning Bonita's call.

## 2020-05-02 ENCOUNTER — Telehealth: Payer: Self-pay

## 2020-05-02 NOTE — Telephone Encounter (Signed)
Call to pt & her daughter- informing them of the dates/times of her upcoming surgery with Dr. Claudia Desanctis on 05/21/20 They are aware of the preop visit, covid test, and surgery- date/time & location. both understand the plans Sophia [name] our surgery scheduler will also reach out to them with all the specifics. They are reminded to call for any concerns or for any changes.

## 2020-05-08 ENCOUNTER — Ambulatory Visit: Payer: Medicare Other | Admitting: Plastic Surgery

## 2020-05-13 ENCOUNTER — Other Ambulatory Visit: Payer: Self-pay

## 2020-05-13 ENCOUNTER — Encounter: Payer: Self-pay | Admitting: Surgical

## 2020-05-13 ENCOUNTER — Ambulatory Visit (INDEPENDENT_AMBULATORY_CARE_PROVIDER_SITE_OTHER): Payer: Medicare Other | Admitting: Surgical

## 2020-05-13 VITALS — BP 138/78 | HR 83 | Temp 98.4°F | Ht 62.0 in | Wt 122.0 lb

## 2020-05-13 DIAGNOSIS — L989 Disorder of the skin and subcutaneous tissue, unspecified: Secondary | ICD-10-CM

## 2020-05-13 MED ORDER — ONDANSETRON HCL 4 MG PO TABS: 4.0000 mg | ORAL_TABLET | Freq: Three times a day (TID) | ORAL | 0 refills | Status: DC | PRN

## 2020-05-13 MED ORDER — TRAMADOL HCL 50 MG PO TABS: 50.0000 mg | ORAL_TABLET | Freq: Three times a day (TID) | ORAL | 0 refills | Status: AC | PRN

## 2020-05-13 NOTE — Progress Notes (Signed)
   Patient ID: Sophia [name], female    DOB: 05/07/1932, 86 y.o.   MRN: [id number]  Chief Complaint  Patient presents with  . Pre-op Exam      ICD-10-CM   1. Skin lesion  L98.9     History of Present Illness: Sophia [name] is a 86 y.o.  female  with a history of scalp lesion.  She presents for preoperative evaluation for upcoming procedure, excision of scalp squamous cell carcinoma with frozen sections and closure with adjacent tissue transfer, scheduled for 05/21/2020 with Dr. Pace.  The patient has not had problems with anesthesia. No history of DVT/PE.  No family history of DVT/PE.  No family or personal history of bleeding or clotting disorders.  Patient is not currently taking any blood thinners.  No history of CVA/MI.   Summary of Previous Visit: Patient has a scalp lesion that had been present for about a year, has been growing in size.  She underwent punch biopsy with Dr. Pace on 04/24/2020 which revealed invasive squamous cell carcinoma.  PMH Significant for: hypertension, stage II chronic kidney disease, diabetes mellitus, vitamin D deficiency, hypothyroidism  Most recent A1c 6.0.   Past Medical History: Allergies: No Known Allergies  Current Medications:  Current Outpatient Medications:  .  ondansetron (ZOFRAN) 4 MG tablet, Take 1 tablet (4 mg total) by mouth every 8 (eight) hours as needed for nausea or vomiting., Disp: 20 tablet, Rfl: 0 .  traMADol (ULTRAM) 50 MG tablet, Take 1 tablet (50 mg total) by mouth every 8 (eight) hours as needed for up to 5 days., Disp: 15 tablet, Rfl: 0 .  alendronate (FOSAMAX) 70 MG tablet, Take 1 tablet (70 mg total) by mouth every 7 (seven) days. Take with a full glass of water on an empty stomach., Disp: 4 tablet, Rfl: 5 .  amLODipine (NORVASC) 5 MG tablet, Take 1 tablet (5 mg total) by mouth daily., Disp: 90 tablet, Rfl: 3 .  blood glucose meter kit and supplies KIT, Use up to 4x daily prn, Disp: 1 each, Rfl: 0 .   Calcium-Magnesium 500-250 MG TABS, Take 1 tablet by mouth daily with supper., Disp: 30 each, Rfl: 5 .  carvedilol (COREG) 25 MG tablet, TAKE 1 TABLET BY MOUTH TWICE DAILY WITH A MEAL, Disp: 180 tablet, Rfl: 1 .  ferrous sulfate 325 (65 FE) MG tablet, Take 1 tablet (325 mg total) by mouth daily with breakfast., Disp:  , Rfl:  .  levothyroxine (SYNTHROID) 75 MCG tablet, TAKE 1 TABLET BY MOUTH IN THE MORNING ON AN EMPTY STOMACH WAIT  ONE  HOUR  BEFORE  EATING, Disp: 90 tablet, Rfl: 1 .  metFORMIN (GLUCOPHAGE-XR) 500 MG 24 hr tablet, Take 1 tablet by mouth once daily with breakfast, Disp: 90 tablet, Rfl: 3 .  olmesartan (BENICAR) 40 MG tablet, Take 1 tablet (40 mg total) by mouth daily. (Please make 6 mos Aug appt), Disp: 90 tablet, Rfl: 1 .  omeprazole (PRILOSEC) 40 MG capsule, TAKE 1 CAPSULE ON AN EMPTY STOMACH 1 HOUR BEFORE BREAKFAST AND ANOTHER AT BEDTIME (2 HOURS AFTER YOUR LAST FOOD AND DRINK EXCEPT WATER), Disp: 180 capsule, Rfl: 1 .  simvastatin (ZOCOR) 40 MG tablet, TAKE 1/2 (ONE-HALF) TABLET BY MOUTH ONCE DAILY AT  6PM, Disp: 90 tablet, Rfl: 1 .  solifenacin (VESICARE) 10 MG tablet, Take 1 tablet (10 mg total) by mouth daily. For urinary control, Disp: 90 tablet, Rfl: 1 .  VITAMIN D, ERGOCALCIFEROL, PO, Take by mouth., Disp: ,   Rfl:   Past Medical Problems: Past Medical History:  Diagnosis Date  . Chronic kidney disease   . Diabetes mellitus without complication (HCC)   . Hyperlipidemia   . Hypertension     Past Surgical History: Past Surgical History:  Procedure Laterality Date  . CHOLECYSTECTOMY    . ESOPHAGOGASTRODUODENOSCOPY N/A 04/29/2015   Procedure: ESOPHAGOGASTRODUODENOSCOPY (EGD);  Surgeon: Marc Magod, MD;  Location: [facility] ENDOSCOPY;  Service: Endoscopy;  Laterality: N/A;  . EYE SURGERY Right 01/19/2019   blocked tear duct   . SIGMOIDOSCOPY      Social History: Social History   Socioeconomic History  . Marital status: Widowed    Spouse name: Not on file  . Number of  children: Not on file  . Years of education: Not on file  . Highest education level: Not on file  Occupational History  . Not on file  Tobacco Use  . Smoking status: Never Smoker  . Smokeless tobacco: Never Used  Vaping Use  . Vaping Use: Never used  Substance and Sexual Activity  . Alcohol use: No  . Drug use: No  . Sexual activity: Not Currently  Other Topics Concern  . Not on file  Social History Narrative  . Not on file   Social Determinants of Health   Financial Resource Strain: Not on file  Food Insecurity: Not on file  Transportation Needs: Not on file  Physical Activity: Not on file  Stress: Not on file  Social Connections: Not on file  Intimate Partner Violence: Not on file    Family History: Family History  Problem Relation Age of Onset  . Heart disease Father   . Colon cancer Sister   . Heart disease Brother   . Heart disease Sister     Review of Systems: Review of Systems  Constitutional: Negative.   Respiratory: Negative.   Cardiovascular: Negative.   Gastrointestinal: Negative.   Neurological: Negative.     Physical Exam: Vital Signs BP 138/78 (BP Location: Left Arm, Patient Position: Sitting, Cuff Size: Normal)   Pulse 83   Temp 98.4 F (36.9 C) (Temporal)   Ht 5' 2" (1.575 m)   Wt 122 lb (55.3 kg)   SpO2 97%   BMI 22.31 kg/m  Physical Exam Constitutional:      General: She is not in acute distress.    Appearance: Normal appearance. She is not ill-appearing.  HENT:     Head: Normocephalic and atraumatic. Fungating irregular lesion noted on scalp. Additional lesions noted posteriorly, ~ 2 mm each. Eyes:     Pupils: Pupils are equal, round Neck:     Musculoskeletal: Normal range of motion.  Cardiovascular:     Rate and Rhythm: Normal rate and regular rhythm.     Pulses: Normal pulses.     Heart sounds: Normal heart sounds. No murmur.  Pulmonary:     Effort: Pulmonary effort is normal. No respiratory distress.     Breath sounds:  Normal breath sounds. No wheezing.  Abdominal:     General: Abdomen is flat. There is no distension.     Palpations: Abdomen is soft.     Tenderness: There is no abdominal tenderness.  Musculoskeletal: Normal range of motion.  Skin:    General: Skin is warm and dry.     Findings: No erythema or rash.  Neurological:     General: No focal deficit present.     Mental Status: She is alert and oriented to person, place, and time. Mental status is   at baseline.     Motor: No weakness.  Psychiatric:        Mood and Affect: Mood normal.        Behavior: Behavior normal.    Assessment/Plan: The patient is scheduled for excision of scalp squamous cell carcinoma with frozen sections and closure with adjacent tissue transfer with Dr. Pace.  Risks, benefits, and alternatives of procedure discussed, questions answered and consent obtained.    Smoking Status: Non-smoker; Counseling Given?  N/A Last Mammogram: N/A  Caprini Score: 5, high; Risk Factors include: Age, and length of planned surgery. Recommendation for mechanical and pharmacological prophylaxis . Encourage early ambulation.   Pictures obtained: Today Pictures were obtained of the patient and placed in the chart with the patient's or guardian's permission.  Post-op Rx sent to pharmacy: Tramadol, Zofran  Patient was provided with the General Surgical Risk consent document and Pain Medication Agreement prior to their appointment.  They had adequate time to read through the risk consent documents and Pain Medication Agreement. We also discussed them in person together during this preop appointment. All of their questions were answered to their satisfaction.  Recommended calling if they have any further questions.  Risk consent form and Pain Medication Agreement to be scanned into patient's chart.  We discussed specific risks associated with the surgical intervention mentioned above, we also discussed specific risks associated with the  patient's past medical history.  Patient has some additional lesions that she has noticed on the posterior scalp, I discussed with her that this is something I can discuss with Dr. Pace, may be able to do a shave biopsy or punch biopsy intraoperatively pending rotational/adjacent tissue transfer flap.  If we are unable to complete intraoperatively, may be able to complete this in the office.  Electronically signed by: Sherron Mummert J Orlando Thalmann, PA-C 05/13/2020 11:31 AM 

## 2020-05-13 NOTE — H&P (View-Only) (Signed)
Patient ID: Sophia [name], female    DOB: [birth year], 86 y.o.   MRN: [id number]  Chief Complaint  Patient presents with  . Pre-op Exam      ICD-10-CM   1. Skin lesion  L98.9     History of Present Illness: Sophia [name] is a 86 y.o.  female  with a history of scalp lesion.  She presents for preoperative evaluation for upcoming procedure, excision of scalp squamous cell carcinoma with frozen sections and closure with adjacent tissue transfer, scheduled for 05/21/2020 with Dr. Claudia Desanctis.  The patient has not had problems with anesthesia. No history of DVT/PE.  No family history of DVT/PE.  No family or personal history of bleeding or clotting disorders.  Patient is not currently taking any blood thinners.  No history of CVA/MI.   Summary of Previous Visit: Patient has a scalp lesion that had been present for about a year, has been growing in size.  She underwent punch biopsy with Dr. Claudia Desanctis on 04/24/2020 which revealed invasive squamous cell carcinoma.  PMH Significant for: hypertension, stage II chronic kidney disease, diabetes mellitus, vitamin D deficiency, hypothyroidism  Most recent A1c 6.0.   Past Medical History: Allergies: No Known Allergies  Current Medications:  Current Outpatient Medications:  .  ondansetron (ZOFRAN) 4 MG tablet, Take 1 tablet (4 mg total) by mouth every 8 (eight) hours as needed for nausea or vomiting., Disp: 20 tablet, Rfl: 0 .  traMADol (ULTRAM) 50 MG tablet, Take 1 tablet (50 mg total) by mouth every 8 (eight) hours as needed for up to 5 days., Disp: 15 tablet, Rfl: 0 .  alendronate (FOSAMAX) 70 MG tablet, Take 1 tablet (70 mg total) by mouth every 7 (seven) days. Take with a full glass of water on an empty stomach., Disp: 4 tablet, Rfl: 5 .  amLODipine (NORVASC) 5 MG tablet, Take 1 tablet (5 mg total) by mouth daily., Disp: 90 tablet, Rfl: 3 .  blood glucose meter kit and supplies KIT, Use up to 4x daily prn, Disp: 1 each, Rfl: 0 .   Calcium-Magnesium 500-250 MG TABS, Take 1 tablet by mouth daily with supper., Disp: 30 each, Rfl: 5 .  carvedilol (COREG) 25 MG tablet, TAKE 1 TABLET BY MOUTH TWICE DAILY WITH A MEAL, Disp: 180 tablet, Rfl: 1 .  ferrous sulfate 325 (65 FE) MG tablet, Take 1 tablet (325 mg total) by mouth daily with breakfast., Disp:  , Rfl:  .  levothyroxine (SYNTHROID) 75 MCG tablet, TAKE 1 TABLET BY MOUTH IN THE MORNING ON AN EMPTY STOMACH WAIT  ONE  HOUR  BEFORE  EATING, Disp: 90 tablet, Rfl: 1 .  metFORMIN (GLUCOPHAGE-XR) 500 MG 24 hr tablet, Take 1 tablet by mouth once daily with breakfast, Disp: 90 tablet, Rfl: 3 .  olmesartan (BENICAR) 40 MG tablet, Take 1 tablet (40 mg total) by mouth daily. (Please make 6 mos Aug appt), Disp: 90 tablet, Rfl: 1 .  omeprazole (PRILOSEC) 40 MG capsule, TAKE 1 CAPSULE ON AN EMPTY STOMACH 1 HOUR BEFORE BREAKFAST AND ANOTHER AT BEDTIME (2 HOURS AFTER YOUR LAST FOOD AND DRINK EXCEPT WATER), Disp: 180 capsule, Rfl: 1 .  simvastatin (ZOCOR) 40 MG tablet, TAKE 1/2 (ONE-HALF) TABLET BY MOUTH ONCE DAILY AT  6PM, Disp: 90 tablet, Rfl: 1 .  solifenacin (VESICARE) 10 MG tablet, Take 1 tablet (10 mg total) by mouth daily. For urinary control, Disp: 90 tablet, Rfl: 1 .  VITAMIN D, ERGOCALCIFEROL, PO, Take by mouth., Disp: ,  Rfl:   Past Medical Problems: Past Medical History:  Diagnosis Date  . Chronic kidney disease   . Diabetes mellitus without complication ([place])   . Hyperlipidemia   . Hypertension     Past Surgical History: Past Surgical History:  Procedure Laterality Date  . CHOLECYSTECTOMY    . ESOPHAGOGASTRODUODENOSCOPY N/A 04/29/2015   Procedure: ESOPHAGOGASTRODUODENOSCOPY (EGD);  Surgeon: Clarene Essex, MD;  Location: [facility] ENDOSCOPY;  Service: Endoscopy;  Laterality: N/A;  . EYE SURGERY Right 01/19/2019   blocked tear duct   . SIGMOIDOSCOPY      Social History: Social History   Socioeconomic History  . Marital status: Widowed    Spouse name: Not on file  . Number of  children: Not on file  . Years of education: Not on file  . Highest education level: Not on file  Occupational History  . Not on file  Tobacco Use  . Smoking status: Never Smoker  . Smokeless tobacco: Never Used  Vaping Use  . Vaping Use: Never used  Substance and Sexual Activity  . Alcohol use: No  . Drug use: No  . Sexual activity: Not Currently  Other Topics Concern  . Not on file  Social History Narrative  . Not on file   Social Determinants of Health   Financial Resource Strain: Not on file  Food Insecurity: Not on file  Transportation Needs: Not on file  Physical Activity: Not on file  Stress: Not on file  Social Connections: Not on file  Intimate Partner Violence: Not on file    Family History: Family History  Problem Relation Age of Onset  . Heart disease Father   . Colon cancer Sister   . Heart disease Brother   . Heart disease Sister     Review of Systems: Review of Systems  Constitutional: Negative.   Respiratory: Negative.   Cardiovascular: Negative.   Gastrointestinal: Negative.   Neurological: Negative.     Physical Exam: Vital Signs BP 138/78 (BP Location: Left Arm, Patient Position: Sitting, Cuff Size: Normal)   Pulse 83   Temp 98.4 F (36.9 C) (Temporal)   Ht _0  (1.575 m)   Wt 122 lb (55.3 kg)   SpO2 97%   BMI 22.31 kg/m  Physical Exam Constitutional:      General: She is not in acute distress.    Appearance: Normal appearance. She is not ill-appearing.  HENT:     Head: Normocephalic and atraumatic. Fungating irregular lesion noted on scalp. Additional lesions noted posteriorly, ~ 2 mm each. Eyes:     Pupils: Pupils are equal, round Neck:     Musculoskeletal: Normal range of motion.  Cardiovascular:     Rate and Rhythm: Normal rate and regular rhythm.     Pulses: Normal pulses.     Heart sounds: Normal heart sounds. No murmur.  Pulmonary:     Effort: Pulmonary effort is normal. No respiratory distress.     Breath sounds:  Normal breath sounds. No wheezing.  Abdominal:     General: Abdomen is flat. There is no distension.     Palpations: Abdomen is soft.     Tenderness: There is no abdominal tenderness.  Musculoskeletal: Normal range of motion.  Skin:    General: Skin is warm and dry.     Findings: No erythema or rash.  Neurological:     General: No focal deficit present.     Mental Status: She is alert and oriented to person, place, and time. Mental status is  at baseline.     Motor: No weakness.  Psychiatric:        Mood and Affect: Mood normal.        Behavior: Behavior normal.    Assessment/Plan: The patient is scheduled for excision of scalp squamous cell carcinoma with frozen sections and closure with adjacent tissue transfer with Dr. Claudia Desanctis.  Risks, benefits, and alternatives of procedure discussed, questions answered and consent obtained.    Smoking Status: Non-smoker; Counseling Given?  N/A Last Mammogram: N/A  Caprini Score: 5, high; Risk Factors include: Age, and length of planned surgery. Recommendation for mechanical and pharmacological prophylaxis . Encourage early ambulation.   Pictures obtained: Today Pictures were obtained of the patient and placed in the chart with the patient's or guardian's permission.  Post-op Rx sent to pharmacy: Tramadol, Zofran  Patient was provided with the General Surgical Risk consent document and Pain Medication Agreement prior to their appointment.  They had adequate time to read through the risk consent documents and Pain Medication Agreement. We also discussed them in person together during this preop appointment. All of their questions were answered to their satisfaction.  Recommended calling if they have any further questions.  Risk consent form and Pain Medication Agreement to be scanned into patient's chart.  We discussed specific risks associated with the surgical intervention mentioned above, we also discussed specific risks associated with the  patient's past medical history.  Patient has some additional lesions that she has noticed on the posterior scalp, I discussed with her that this is something I can discuss with Dr. Claudia Desanctis, may be able to do a shave biopsy or punch biopsy intraoperatively pending rotational/adjacent tissue transfer flap.  If we are unable to complete intraoperatively, may be able to complete this in the office.  Electronically signed by: Carola Rhine Kimball Appleby, PA-C 05/13/2020 11:31 AM

## 2020-05-14 ENCOUNTER — Other Ambulatory Visit: Payer: Self-pay | Admitting: Family Medicine

## 2020-05-14 DIAGNOSIS — E119 Type 2 diabetes mellitus without complications: Secondary | ICD-10-CM

## 2020-05-17 ENCOUNTER — Other Ambulatory Visit (HOSPITAL_COMMUNITY)
Admission: RE | Admit: 2020-05-17 | Discharge: 2020-05-17 | Disposition: A | Discharge status: Home / Self Care | Payer: Medicare Other | Source: Ambulatory Visit | Attending: Plastic Surgery | Admitting: Plastic Surgery

## 2020-05-17 DIAGNOSIS — Z20822 Contact with and (suspected) exposure to covid-19: Secondary | ICD-10-CM | POA: Insufficient documentation

## 2020-05-17 DIAGNOSIS — Z01812 Encounter for preprocedural laboratory examination: Secondary | ICD-10-CM | POA: Diagnosis not present

## 2020-05-17 LAB — SARS CORONAVIRUS 2 (TAT 6-24 HRS): SARS Coronavirus 2: NEGATIVE

## 2020-05-20 ENCOUNTER — Encounter (HOSPITAL_COMMUNITY): Payer: Self-pay | Admitting: Plastic Surgery

## 2020-05-20 NOTE — Progress Notes (Signed)
Spoke with pt for pre-op call. Pt denies cardiac history, chest pain or shortness of breath. Pt is pre-diabetic and is on Metformin. She states she has a CBG meter but has not checked her blood sugar in a while. Last A1C was 6.0 on 03/06/20.  Asked pt to check her blood sugar in the AM when she gets up and every 2 hours until she leaves for the hospital.If blood sugar is 70 or below, treat with 1/2 cup of clear juice (apple or cranberry) and recheck blood sugar 15 minutes after drinking juice. If blood sugar continues to be 70 or below, call the Short Stay department and ask to speak to a nurse. Pt voiced understanding.   Covid test done on 05/17/20 and it's negative.  Pt states she's been in quarantine since the test was done and understands that she stays in quarantine until she comes to the hospital tomorrow.

## 2020-05-21 ENCOUNTER — Other Ambulatory Visit: Payer: Self-pay

## 2020-05-21 ENCOUNTER — Encounter (HOSPITAL_COMMUNITY): Payer: Self-pay | Admitting: Plastic Surgery

## 2020-05-21 ENCOUNTER — Ambulatory Visit (HOSPITAL_COMMUNITY): Payer: Medicare Other | Admitting: Certified Registered"

## 2020-05-21 ENCOUNTER — Ambulatory Visit: Payer: Medicare Other | Admitting: Licensed Clinical Social Worker

## 2020-05-21 ENCOUNTER — Encounter (HOSPITAL_COMMUNITY)
Admission: RE | Disposition: A | Discharge status: Home / Self Care | Payer: Self-pay | Source: Home / Self Care | Attending: Plastic Surgery

## 2020-05-21 ENCOUNTER — Ambulatory Visit (HOSPITAL_COMMUNITY)
Admission: RE | Admit: 2020-05-21 | Discharge: 2020-05-21 | Disposition: A | Discharge status: Home / Self Care | Payer: Medicare Other | Attending: Plastic Surgery | Admitting: Plastic Surgery

## 2020-05-21 DIAGNOSIS — Z800 Family history of malignant neoplasm of digestive organs: Secondary | ICD-10-CM | POA: Insufficient documentation

## 2020-05-21 DIAGNOSIS — N183 Chronic kidney disease, stage 3 unspecified: Secondary | ICD-10-CM | POA: Diagnosis not present

## 2020-05-21 DIAGNOSIS — E559 Vitamin D deficiency, unspecified: Secondary | ICD-10-CM

## 2020-05-21 DIAGNOSIS — Z7983 Long term (current) use of bisphosphonates: Secondary | ICD-10-CM | POA: Diagnosis not present

## 2020-05-21 DIAGNOSIS — E782 Mixed hyperlipidemia: Secondary | ICD-10-CM

## 2020-05-21 DIAGNOSIS — I129 Hypertensive chronic kidney disease with stage 1 through stage 4 chronic kidney disease, or unspecified chronic kidney disease: Secondary | ICD-10-CM | POA: Insufficient documentation

## 2020-05-21 DIAGNOSIS — Z79890 Hormone replacement therapy (postmenopausal): Secondary | ICD-10-CM | POA: Insufficient documentation

## 2020-05-21 DIAGNOSIS — E119 Type 2 diabetes mellitus without complications: Secondary | ICD-10-CM

## 2020-05-21 DIAGNOSIS — Z79899 Other long term (current) drug therapy: Secondary | ICD-10-CM | POA: Insufficient documentation

## 2020-05-21 DIAGNOSIS — E1122 Type 2 diabetes mellitus with diabetic chronic kidney disease: Secondary | ICD-10-CM | POA: Diagnosis not present

## 2020-05-21 DIAGNOSIS — Z8249 Family history of ischemic heart disease and other diseases of the circulatory system: Secondary | ICD-10-CM | POA: Insufficient documentation

## 2020-05-21 DIAGNOSIS — N182 Chronic kidney disease, stage 2 (mild): Secondary | ICD-10-CM

## 2020-05-21 DIAGNOSIS — C4442 Squamous cell carcinoma of skin of scalp and neck: Secondary | ICD-10-CM | POA: Diagnosis not present

## 2020-05-21 DIAGNOSIS — Z7984 Long term (current) use of oral hypoglycemic drugs: Secondary | ICD-10-CM | POA: Diagnosis not present

## 2020-05-21 DIAGNOSIS — I10 Essential (primary) hypertension: Secondary | ICD-10-CM

## 2020-05-21 DIAGNOSIS — M810 Age-related osteoporosis without current pathological fracture: Secondary | ICD-10-CM

## 2020-05-21 DIAGNOSIS — R739 Hyperglycemia, unspecified: Secondary | ICD-10-CM

## 2020-05-21 HISTORY — DX: Gastro-esophageal reflux disease without esophagitis: K21.9

## 2020-05-21 HISTORY — PX: BASAL CELL CARCINOMA EXCISION: SHX1214

## 2020-05-21 HISTORY — PX: ADJACENT TISSUE TRANSFER/TISSUE REARRANGEMENT: SHX6829

## 2020-05-21 HISTORY — DX: Malignant (primary) neoplasm, unspecified: C80.1

## 2020-05-21 HISTORY — DX: Hypothyroidism, unspecified: E03.9

## 2020-05-21 LAB — GLUCOSE, CAPILLARY
Glucose-Capillary: 111 mg/dL — ABNORMAL HIGH (ref 70–99)
Glucose-Capillary: 101 mg/dL — ABNORMAL HIGH (ref 70–99)
Glucose-Capillary: 101 mg/dL — ABNORMAL HIGH (ref 70–99)

## 2020-05-21 LAB — BASIC METABOLIC PANEL
Anion gap: 10 (ref 5–15)
BUN: 22 mg/dL (ref 8–23)
CO2: 23 mmol/L (ref 22–32)
Calcium: 9.5 mg/dL (ref 8.9–10.3)
Chloride: 107 mmol/L (ref 98–111)
Creatinine, Ser: 1.19 mg/dL — ABNORMAL HIGH (ref 0.44–1.00)
GFR, Estimated: 45 mL/min — ABNORMAL LOW (ref >60)
Glucose, Bld: 102 mg/dL — ABNORMAL HIGH (ref 70–99)
Potassium: 4 mmol/L (ref 3.5–5.1)
Sodium: 140 mmol/L (ref 135–145)

## 2020-05-21 SURGERY — EXCISION, CARCINOMA, BASAL CELL, WITH FROZEN SECTION EXAMINATION
Anesthesia: General | Site: Scalp

## 2020-05-21 MED ORDER — 0.9 % SODIUM CHLORIDE (POUR BTL) OPTIME
TOPICAL | Status: DC | PRN
  Administered 2020-05-21: 1000 mL

## 2020-05-21 MED ORDER — BUPIVACAINE-EPINEPHRINE (PF) 0.25% -1:200000 IJ SOLN
INTRAMUSCULAR | Status: AC
  Filled 2020-05-21: qty 30

## 2020-05-21 MED ORDER — ONDANSETRON HCL 4 MG/2ML IJ SOLN
INTRAMUSCULAR | Status: DC | PRN
  Administered 2020-05-21: 4 mg via INTRAVENOUS

## 2020-05-21 MED ORDER — SODIUM BICARBONATE 4 % IV SOLN
INTRAVENOUS | Status: DC
  Filled 2020-05-21: qty 50

## 2020-05-21 MED ORDER — CEFAZOLIN SODIUM-DEXTROSE 2-4 GM/100ML-% IV SOLN
2.0000 g | INTRAVENOUS | Status: AC
  Administered 2020-05-21: 2 g via INTRAVENOUS
  Filled 2020-05-21: qty 100

## 2020-05-21 MED ORDER — PHENYLEPHRINE HCL (PRESSORS) 10 MG/ML IV SOLN
INTRAVENOUS | Status: DC | PRN
  Administered 2020-05-21 (×4): 80 ug via INTRAVENOUS

## 2020-05-21 MED ORDER — LIDOCAINE-EPINEPHRINE 1 %-1:100000 IJ SOLN
INTRAMUSCULAR | Status: AC
  Filled 2020-05-21: qty 1

## 2020-05-21 MED ORDER — LIDOCAINE HCL (CARDIAC) PF 100 MG/5ML IV SOSY
PREFILLED_SYRINGE | INTRAVENOUS | Status: DC | PRN
  Administered 2020-05-21: 40 mg via INTRAVENOUS

## 2020-05-21 MED ORDER — FENTANYL CITRATE (PF) 250 MCG/5ML IJ SOLN
INTRAMUSCULAR | Status: AC
  Filled 2020-05-21: qty 5

## 2020-05-21 MED ORDER — ORAL CARE MOUTH RINSE: 15.0000 mL | Freq: Once | OROMUCOSAL | Status: AC

## 2020-05-21 MED ORDER — LACTATED RINGERS IV SOLN: INTRAVENOUS | Status: DC

## 2020-05-21 MED ORDER — SODIUM BICARBONATE 4 % IV SOLN
INTRAVENOUS | Status: DC | PRN
  Administered 2020-05-21: 150 mL via INTRAMUSCULAR

## 2020-05-21 MED ORDER — PROPOFOL 10 MG/ML IV BOLUS
INTRAVENOUS | Status: DC | PRN
  Administered 2020-05-21: 40 mg via INTRAVENOUS
  Administered 2020-05-21: 20 mg via INTRAVENOUS
  Administered 2020-05-21: 60 mg via INTRAVENOUS

## 2020-05-21 MED ORDER — LIDOCAINE-EPINEPHRINE (PF) 1 %-1:200000 IJ SOLN
INTRAMUSCULAR | Status: AC
  Filled 2020-05-21: qty 30

## 2020-05-21 MED ORDER — CHLORHEXIDINE GLUCONATE 0.12 % MT SOLN
15.0000 mL | Freq: Once | OROMUCOSAL | Status: AC
  Administered 2020-05-21: 15 mL via OROMUCOSAL
  Filled 2020-05-21: qty 15

## 2020-05-21 MED ORDER — FENTANYL CITRATE (PF) 250 MCG/5ML IJ SOLN
INTRAMUSCULAR | Status: DC | PRN
  Administered 2020-05-21 (×2): 25 ug via INTRAVENOUS

## 2020-05-21 MED ORDER — BACITRACIN ZINC 500 UNIT/GM EX OINT
TOPICAL_OINTMENT | CUTANEOUS | Status: AC
  Filled 2020-05-21: qty 28.35

## 2020-05-21 MED ORDER — GLYCOPYRROLATE 0.2 MG/ML IJ SOLN
INTRAMUSCULAR | Status: DC | PRN
  Administered 2020-05-21: .2 mg via INTRAVENOUS

## 2020-05-21 MED ORDER — EPHEDRINE SULFATE 50 MG/ML IJ SOLN
INTRAMUSCULAR | Status: DC | PRN
  Administered 2020-05-21 (×2): 10 mg via INTRAVENOUS

## 2020-05-21 MED ORDER — PROPOFOL 10 MG/ML IV BOLUS
INTRAVENOUS | Status: AC
  Filled 2020-05-21: qty 20

## 2020-05-21 SURGICAL SUPPLY — 71 items
BLADE CLIPPER SURG (BLADE) IMPLANT
BLADE SURG 15 STRL LF DISP TIS (BLADE) ×1 IMPLANT
BLADE SURG 15 STRL SS (BLADE) ×2
BNDG COHESIVE 4X5 TAN STRL (GAUZE/BANDAGES/DRESSINGS) IMPLANT
BUR ROUND FLUTED 5 RND (BURR) IMPLANT
CANISTER SUCT 1200ML W/VALVE (MISCELLANEOUS) ×2 IMPLANT
CANISTER SUCT 3000ML PPV (MISCELLANEOUS) IMPLANT
CHLORAPREP W/TINT 26 (MISCELLANEOUS) ×2 IMPLANT
COVER BACK TABLE 60X90IN (DRAPES) ×2 IMPLANT
COVER MAYO STAND STRL (DRAPES) ×2 IMPLANT
COVER SURGICAL LIGHT HANDLE (MISCELLANEOUS) ×2 IMPLANT
COVER WAND RF STERILE (DRAPES) ×2 IMPLANT
DECANTER SPIKE VIAL GLASS SM (MISCELLANEOUS) ×2 IMPLANT
DERMABOND ADVANCED (GAUZE/BANDAGES/DRESSINGS)
DERMABOND ADVANCED .7 DNX12 (GAUZE/BANDAGES/DRESSINGS) IMPLANT
DRAIN CHANNEL 15F RND FF W/TCR (WOUND CARE) IMPLANT
DRAPE LAPAROTOMY 100X72 PEDS (DRAPES) ×2 IMPLANT
DRAPE U-SHAPE 76X120 STRL (DRAPES) IMPLANT
DRSG CALCIUM ALGINATE 4X4 (GAUZE/BANDAGES/DRESSINGS) IMPLANT
DRSG PAD ABDOMINAL 8X10 ST (GAUZE/BANDAGES/DRESSINGS) ×2 IMPLANT
DRSG TEGADERM 2-3/8X2-3/4 SM (GAUZE/BANDAGES/DRESSINGS) IMPLANT
ELECT CAUTERY BLADE 6.4 (BLADE) ×2 IMPLANT
ELECT COATED BLADE 2.86 ST (ELECTRODE) IMPLANT
ELECT NEEDLE BLADE 2-5/6 (NEEDLE) ×2 IMPLANT
ELECT REM PT RETURN 9FT ADLT (ELECTROSURGICAL) ×2
ELECTRODE REM PT RTRN 9FT ADLT (ELECTROSURGICAL) ×1 IMPLANT
GAUZE SPONGE 4X4 12PLY STRL (GAUZE/BANDAGES/DRESSINGS) ×2 IMPLANT
GAUZE SPONGE 4X4 12PLY STRL LF (GAUZE/BANDAGES/DRESSINGS) IMPLANT
GAUZE XEROFORM 1X8 LF (GAUZE/BANDAGES/DRESSINGS) IMPLANT
GLOVE BIO SURGEON STRL SZ 6.5 (GLOVE) IMPLANT
GLOVE BIO SURGEON STRL SZ7 (GLOVE) ×4 IMPLANT
GLOVE BIO SURGEON STRL SZ7.5 (GLOVE) ×4 IMPLANT
GLOVE BIOGEL M STRL SZ7.5 (GLOVE) ×2 IMPLANT
GLOVE INDICATOR 8.0 STRL GRN (GLOVE) ×2 IMPLANT
GOWN STRL REUS W/ TWL LRG LVL3 (GOWN DISPOSABLE) ×3 IMPLANT
GOWN STRL REUS W/TWL LRG LVL3 (GOWN DISPOSABLE) ×6
HEMOSTAT SURGICEL 2X14 (HEMOSTASIS) IMPLANT
KIT BASIN OR (CUSTOM PROCEDURE TRAY) ×2 IMPLANT
NEEDLE HYPO 22GX1.5 SAFETY (NEEDLE) ×2 IMPLANT
NEEDLE HYPO 25GX1X1/2 BEV (NEEDLE) ×2 IMPLANT
NS IRRIG 1000ML POUR BTL (IV SOLUTION) ×2 IMPLANT
PACK GENERAL/GYN (CUSTOM PROCEDURE TRAY) ×2 IMPLANT
PACK SURGICAL SETUP 50X90 (CUSTOM PROCEDURE TRAY) ×2 IMPLANT
PENCIL SMOKE EVACUATOR (MISCELLANEOUS) ×2 IMPLANT
PIN SAFETY STERILE (MISCELLANEOUS) IMPLANT
SHEET MEDIUM DRAPE 40X70 STRL (DRAPES) IMPLANT
SPONGE LAP 18X18 RF (DISPOSABLE) IMPLANT
STAPLER VISISTAT 35W (STAPLE) ×2 IMPLANT
STOCKINETTE 6  STRL (DRAPES) ×2
STOCKINETTE 6 STRL (DRAPES) ×1 IMPLANT
STRIP CLOSURE SKIN 1/2X4 (GAUZE/BANDAGES/DRESSINGS) IMPLANT
SUT ETHILON 2 0 FS 18 (SUTURE) IMPLANT
SUT MNCRL AB 3-0 PS2 18 (SUTURE) IMPLANT
SUT MNCRL AB 4-0 PS2 18 (SUTURE) IMPLANT
SUT MON AB 4-0 PC3 18 (SUTURE) IMPLANT
SUT MON AB 5-0 P3 18 (SUTURE) IMPLANT
SUT MON AB 5-0 PS2 18 (SUTURE) IMPLANT
SUT PDS AB 2-0 CT2 27 (SUTURE) IMPLANT
SUT PDS AB 3-0 SH 27 (SUTURE) ×8 IMPLANT
SUT PLAIN 5 0 P 3 18 (SUTURE) IMPLANT
SUT PLAIN GUT FAST 5-0 (SUTURE) IMPLANT
SUT SILK 2 0 SH (SUTURE) IMPLANT
SUT VIC AB 3-0 PS1 18 (SUTURE)
SUT VIC AB 3-0 PS1 18XBRD (SUTURE) IMPLANT
SUT VICRYL 4-0 PS2 18IN ABS (SUTURE) IMPLANT
SYR BULB EAR ULCER 3OZ GRN STR (SYRINGE) ×2 IMPLANT
SYR CONTROL 10ML LL (SYRINGE) ×2 IMPLANT
TOWEL GREEN STERILE FF (TOWEL DISPOSABLE) ×2 IMPLANT
TUBE CONNECTING 12X1/4 (SUCTIONS) IMPLANT
TUBE CONNECTING 20X1/4 (TUBING) IMPLANT
YANKAUER SUCT BULB TIP NO VENT (SUCTIONS) ×2 IMPLANT

## 2020-05-21 NOTE — Progress Notes (Signed)
Lupita Leash, patient's daughter, updated on status.

## 2020-05-21 NOTE — Brief Op Note (Signed)
05/21/2020  1:46 PM  PATIENT:  Sophia [name]  87 y.o. female  PRE-OPERATIVE DIAGNOSIS:  squamous cell carcinoma of scalp  POST-OPERATIVE DIAGNOSIS:  squamous cell carcinoma of scalp  PROCEDURE:  Procedure(s) with comments: excision of scalp squamous cell carcinoma with frozen sections (N/A) - 1.5 hours total closure with adjacent tissue transfer (N/A)  SURGEON:  Surgeon(s) and Role:    * Asako Saliba, Wendy Poet, MD - Primary  PHYSICIAN ASSISTANT: Materials engineer, PA  ASSISTANTS: none   ANESTHESIA:   general  EBL:  20   BLOOD ADMINISTERED:none  DRAINS: none   LOCAL MEDICATIONS USED:  XYLOCAINE   SPECIMEN:  Source of Specimen:  scalp scc  DISPOSITION OF SPECIMEN:  PATHOLOGY  COUNTS:  YES  TOURNIQUET:  * No tourniquets in log *  DICTATION: .Dragon Dictation  PLAN OF CARE: Discharge to home after PACU  PATIENT DISPOSITION:  PACU - hemodynamically stable.   Delay start of Pharmacological VTE agent (>24hrs) due to surgical blood loss or risk of bleeding: not applicable

## 2020-05-21 NOTE — Op Note (Signed)
Operative Note   DATE OF OPERATION: 05/21/2020  SURGICAL DEPARTMENT: Plastic Surgery  PREOPERATIVE DIAGNOSES: Scalp squamous cell carcinoma  POSTOPERATIVE DIAGNOSES:  same  PROCEDURE: 1.  Excision of scalp squamous cell carcinoma totaling 7 x 5 cm 2.  Closure of scalp defect with adjacent tissue transfer totaling 13 x 13 cm including the primary and secondary defect.  SURGEON: Ancil Linsey, MD  ASSISTANT: Zadie Cleverly, PA The advanced practice practitioner (APP) assisted throughout the case.  The APP was essential in retraction and counter traction when needed to make the case progress smoothly.  This retraction and assistance made it possible to see the tissue plans for the procedure.  The assistance was needed for blood control, tissue re-approximation and assisted with closure of the incision site.  ANESTHESIA:  General.   COMPLICATIONS: None.   INDICATIONS FOR PROCEDURE:  The patient, Sophia [name] is a 87 y.o. female born on 04-16-1932, is here for treatment of squamous cell carcinoma scalp MRN: [id number]  CONSENT:  Informed consent was obtained directly from the patient. Risks, benefits and alternatives were fully discussed. Specific risks including but not limited to bleeding, infection, hematoma, seroma, scarring, pain, contracture, asymmetry, wound healing problems, and need for further surgery were all discussed. The patient did have an ample opportunity to have questions answered to satisfaction.   DESCRIPTION OF PROCEDURE:  The patient was taken to the operating room. SCDs were placed and antibiotics were given.  General anesthesia was administered.  The patient's operative site was prepped and draped in a sterile fashion. A time out was performed and all information was confirmed to be correct.  Started by marking out a 1 cm margin around the mass.  I then planned out a rotational flap for closure.  I then infiltrated the area with tumescent solution and this  was given time to work.  I marked out 12:00 of the lesion and on the native skin.  The incision was then borders of the mass leaving approximately 1 cm margins.  This then elevated nicely off the pericranium with no signs of deeper invasion.  I marked twelve o clock on the specimen and sent off to pathology.  Frozen section analysis returned negative peripheral and deep margins.  I then proceeded with the flap closure.  The entire scalp was undermined in the suprapericranial plane.  I incised along my rotational flap line with a knife and dissected down to the area of undermining.  Meticulous hemostasis was obtained.  The flap was rotated into place and helps with staples to ensure appropriate tension and closure was followed by interrupted buried 3-0 PDS sutures for the galea followed by 3-0 PDS mattress sutures for the skin.  This gave a nice on table result.  Soft dressing was applied.  The patient tolerated the procedure well.  There were no complications. The patient was allowed to wake from anesthesia, extubated and taken to the recovery room in satisfactory condition.

## 2020-05-21 NOTE — Anesthesia Postprocedure Evaluation (Signed)
Anesthesia Post Note  Patient: Sophia [name]  Procedure(s) Performed: excision of scalp squamous cell carcinoma with frozen sections (N/A Scalp) closure with adjacent tissue transfer (N/A Scalp)     Patient location during evaluation: PACU Anesthesia Type: General Level of consciousness: awake and alert Pain management: pain level controlled Vital Signs Assessment: post-procedure vital signs reviewed and stable Respiratory status: spontaneous breathing, nonlabored ventilation, respiratory function stable and patient connected to nasal cannula oxygen Cardiovascular status: blood pressure returned to baseline and stable Postop Assessment: no apparent nausea or vomiting Anesthetic complications: no   No complications documented.  Last Vitals:  Vitals:   05/21/20 1404 05/21/20 1420  BP: 135/63 130/66  Pulse:  88  Resp: 18 20  Temp: 36.6 C 36.6 C  SpO2: 98% 98%    Last Pain:  Vitals:   05/21/20 1420  TempSrc:   PainSc: 0-No pain                 Cruze Zingaro COKER

## 2020-05-21 NOTE — Patient Instructions (Addendum)
Licensed Clinical Social Worker Visit Information  Materials Provided: No  05/21/2020  Name: Sophia [name]    MRN: [id number]       DOB: [birth year]  Sophia [name] is a 87 y.o. year old female who is a primary care patient of [name], Broadus John, MD. The CCM team was consulted for assistance with [employer] .   Review of patient status, including review of consultants reports, other relevant assessments, and collaboration with appropriate care team members and the patient's provider was performed as part of comprehensive patient evaluation and provision of chronic care management services.    SDOH (Social Determinants of Health) assessments performed: No; risk for depression; risk for tobacco use; risk for stress; risk for physical inactivity  [name] called phone number for client today; but, [name] was not able to speak via phone with client today; [name] was not able to leave phone message for client since phone message was not set up on client phone.   Follow Up Plan:[name] to call client in next 4 weeks to assess social work needs of client at that time   [name] was not able to speak via phone with client today; thus client was not able to verbalize understanding of instructions provided today and was not able to accept or decline a print copy of patient instruction materials.   Kelton Pillar.Seanmichael Salmons MSW, [name] Licensed Clinical Social Worker [facility] [phone]

## 2020-05-21 NOTE — Anesthesia Procedure Notes (Signed)
Procedure Name: LMA Insertion Date/Time: 05/21/2020 12:30 PM Performed by: Sheppard Evens, CRNA Pre-anesthesia Checklist: Patient identified, Emergency Drugs available, Suction available and Patient being monitored Patient Re-evaluated:Patient Re-evaluated prior to induction Oxygen Delivery Method: Circle System Utilized Preoxygenation: Pre-oxygenation with 100% oxygen Induction Type: IV induction Ventilation: Mask ventilation without difficulty LMA: LMA inserted LMA Size: 4.0 Number of attempts: 1 Airway Equipment and Method: Bite block Placement Confirmation: positive ETCO2 Tube secured with: Tape Dental Injury: Teeth and Oropharynx as per pre-operative assessment

## 2020-05-21 NOTE — Chronic Care Management (AMB) (Signed)
Chronic Care Management    Clinical Social Work Follow Up Note  05/21/2020 Name: Sophia [name] MRN: [id number] DOB: [birth year]  Sophia [name] is a 87 y.o. year old female who is a primary care patient of [name], [name], MD. The CCM team was consulted for assistance with [name]   Review of patient status, including review of consultants reports, other relevant assessments, and collaboration with appropriate care team members and the patient's provider was performed as part of comprehensive patient evaluation and provision of chronic care management services.    SDOH (Social Determinants of Health) assessments performed: No; risk for depression; risk for tobacco use; risk for stress; risk for physical inactivity  Flowsheet Row Chronic Care Management from 12/29/2019 in [facility]  PHQ-9 Total Score 2       GAD 7 : Generalized Anxiety Score 12/29/2019 04/26/2015  Nervous, Anxious, on Edge 0 1  Control/stop worrying 0 1  Worry too much - different things 0 1  Trouble relaxing 0 1  Restless 0 0  Easily annoyed or irritable 0 2  Afraid - awful might happen 0 1  Total GAD 7 Score 0 7  Anxiety Difficulty Somewhat difficult Not difficult at all    Outpatient Encounter Medications as of 05/21/2020  Medication Sig Note  . alendronate (FOSAMAX) 70 MG tablet Take 1 tablet (70 mg total) by mouth every 7 (seven) days. Take with a full glass of water on an empty stomach. (Patient taking differently: Take 70 mg by mouth every Sunday. Take with a full glass of water on an empty stomach.) 05/13/2020: Pt states she skips one dose per month about every month so she averages about 3 doses per month  . amLODipine (NORVASC) 5 MG tablet Take 1 tablet (5 mg total) by mouth daily.   . blood glucose meter kit and supplies KIT Use up to 4x daily prn   . Calcium-Magnesium 500-250 MG TABS Take 1 tablet by mouth daily with supper.   . carvedilol (COREG) 25 MG tablet  TAKE 1 TABLET BY MOUTH TWICE DAILY WITH A MEAL (Patient taking differently: Take 25 mg by mouth 2 (two) times daily with a meal.)   . Cholecalciferol (DIALYVITE VITAMIN D 5000) 125 MCG (5000 UT) capsule Take 5,000 Units by mouth daily.   . ferrous sulfate 325 (65 FE) MG tablet Take 1 tablet (325 mg total) by mouth daily with breakfast.   . ibuprofen (ADVIL) 200 MG tablet Take 400 mg by mouth every 6 (six) hours as needed for headache or moderate pain.   Marland Kitchen levothyroxine (SYNTHROID) 75 MCG tablet TAKE 1 TABLET BY MOUTH IN THE MORNING ON AN EMPTY STOMACH WAIT  ONE  HOUR  BEFORE  EATING (Patient taking differently: Take 75 mcg by mouth daily before breakfast.)   . Magnesium 250 MG TABS Take 250 mg by mouth daily.   . metFORMIN (GLUCOPHAGE-XR) 500 MG 24 hr tablet Take 1 tablet by mouth once daily with breakfast   . olmesartan (BENICAR) 40 MG tablet Take 1 tablet (40 mg total) by mouth daily. (Please make 6 mos Aug appt)   . omeprazole (PRILOSEC) 40 MG capsule TAKE 1 CAPSULE ON AN EMPTY STOMACH 1 HOUR BEFORE BREAKFAST AND ANOTHER AT BEDTIME (2 HOURS AFTER YOUR LAST FOOD AND DRINK EXCEPT WATER) (Patient taking differently: Take 40 mg by mouth 2 (two) times daily.)   . ondansetron (ZOFRAN) 4 MG tablet Take 1 tablet (4 mg total) by mouth every 8 (eight) hours as  needed for nausea or vomiting. 05/13/2020: Hasnt started  . simvastatin (ZOCOR) 40 MG tablet TAKE 1/2 (ONE-HALF) TABLET BY MOUTH ONCE DAILY AT  6PM (Patient taking differently: Take 20 mg by mouth daily with supper.)   . solifenacin (VESICARE) 10 MG tablet Take 1 tablet (10 mg total) by mouth daily. For urinary control (Patient taking differently: Take 10 mg by mouth daily as needed (Urinary urgency). For urinary control)   . vitamin B-12 (CYANOCOBALAMIN) 1000 MCG tablet Take 1,000 mcg by mouth daily.    Facility-Administered Encounter Medications as of 05/21/2020  Medication  . lactated ringers infusion  . lidocaine (XYLOCAINE) 1 % 50 mL, EPINEPHrine  (ADRENALIN) 1 mg, sodium bicarbonate (NEUT) 5 mL in lactated ringers 1,000 mL solution    [name] called phone number for client today; but, [name] was not able to speak via phone with client today; [name] was not able to leave phone message for client since phone message was not set up on client phone.   Follow Up Plan: [name] to call client in next 4 weeks to assess social work needs of client at that time   [name].Mahaila Tischer MSW, [name] Licensed Clinical Social Worker [facility] [phone]

## 2020-05-21 NOTE — Anesthesia Preprocedure Evaluation (Signed)
Anesthesia Evaluation  Patient identified by MRN, date of birth, ID band Patient awake    Reviewed: Allergy & Precautions, NPO status , Patient's Chart, lab work & pertinent test results  Airway Mallampati: II  TM Distance: >3 FB Neck ROM: Full    Dental  (+) Teeth Intact, Dental Advisory Given   Pulmonary    breath sounds clear to auscultation       Cardiovascular hypertension,  Rhythm:Regular Rate:Normal     Neuro/Psych    GI/Hepatic   Endo/Other  diabetes  Renal/GU      Musculoskeletal   Abdominal   Peds  Hematology   Anesthesia Other Findings   Reproductive/Obstetrics                             Anesthesia Physical Anesthesia Plan  ASA: II  Anesthesia Plan: General   Post-op Pain Management:    Induction: Intravenous  PONV Risk Score and Plan:   Airway Management Planned: LMA  Additional Equipment:   Intra-op Plan:   Post-operative Plan:   Informed Consent: I have reviewed the patients History and Physical, chart, labs and discussed the procedure including the risks, benefits and alternatives for the proposed anesthesia with the patient or authorized representative who has indicated his/her understanding and acceptance.     Dental advisory given  Plan Discussed with: CRNA and Anesthesiologist  Anesthesia Plan Comments:         Anesthesia Quick Evaluation

## 2020-05-21 NOTE — Transfer of Care (Signed)
Immediate Anesthesia Transfer of Care Note  Patient: Sophia [name]  Procedure(s) Performed: excision of scalp squamous cell carcinoma with frozen sections (N/A Scalp) closure with adjacent tissue transfer (N/A Scalp)  Patient Location: PACU  Anesthesia Type:General  Level of Consciousness: responds to stimulation  Airway & Oxygen Therapy: Patient Spontanous Breathing and Patient connected to face mask oxygen  Post-op Assessment: Report given to RN and Post -op Vital signs reviewed and stable  Post vital signs: Reviewed and stable  Last Vitals:  Vitals Value Taken Time  BP 126/67   Temp 97.5   Pulse 89 05/21/20 1349  Resp 20 05/21/20 1349  SpO2 100 % 05/21/20 1349  Vitals shown include unvalidated device data.  Last Pain:  Vitals:   05/21/20 0957  TempSrc:   PainSc: 0-No pain    Glu 101  Patients Stated Pain Goal: 4 (05/21/20 0957)  Complications: No complications documented.

## 2020-05-21 NOTE — Discharge Instructions (Addendum)
Activity as tolerated, you can shower 24 hours after your procedure.  We covered the incisions on your head with gauze and secured in place with hat provided, recommend changing this after you shower.  When showering avoid scrubbing your head, just allow water to run over the incisions.  NO driving while in pain, taking pain medication or if you are unable to safely react to traffic. No heavy activities. Avoid any strenuous activities.  Take Pain medication (Norco) as needed for severe pain.  Use Tylenol and/or ibuprofen for postop pain that is moderate.  Diet: Regular. Drink plenty of fluids (water, avoid juice/soda) and eat healthy, high protein, low carbs.   Special Instructions: Call Doctor if any unusual problems occur such as pain, excessive Bleeding, unrelieved Nausea/vomiting, Fever &/or chills   Follow-up appointment: Scheduled for next week.

## 2020-05-21 NOTE — Interval H&P Note (Signed)
Patient seen and examined. Risk and benefits discussed. Proceed with surgery.

## 2020-05-22 ENCOUNTER — Encounter (HOSPITAL_COMMUNITY): Payer: Self-pay | Admitting: Plastic Surgery

## 2020-05-23 LAB — SURGICAL PATHOLOGY

## 2020-05-29 ENCOUNTER — Other Ambulatory Visit: Payer: Self-pay

## 2020-05-29 ENCOUNTER — Encounter: Payer: Self-pay | Admitting: Plastic Surgery

## 2020-05-29 ENCOUNTER — Ambulatory Visit (INDEPENDENT_AMBULATORY_CARE_PROVIDER_SITE_OTHER): Payer: Medicare Other | Admitting: Plastic Surgery

## 2020-05-29 VITALS — BP 157/67 | HR 62

## 2020-05-29 DIAGNOSIS — L989 Disorder of the skin and subcutaneous tissue, unspecified: Secondary | ICD-10-CM

## 2020-05-29 NOTE — Progress Notes (Signed)
Patient presents 1 week postop from excision of an invasive squamous cell carcinoma.  I took 1 cm margins on this and dissected down to the paracranial layer and then closed this with a rotational flap.  Intraoperative frozen sections reported negative peripheral and deep margins.  Unfortunately on permanent analysis there was focal deep margin involvement of the tumor.  The pathologist described this as just a few cells at the deep margin.  Clinically the tumor lifted off the pericranium with no gross involvement.  Patient overall feels like she is doing well and is healing appropriately.  She does report some numbness on the flap which is not surprising.  On exam all her skin is healing nicely and is well approximated.  I long discussion with the patient and her daughter about how to proceed.  We discussed the option of surgical reexcision however that would involve going back to the operating room.  Additionally it would be somewhat difficult to identify the specific location of the area of concern now that the scalp has been rearranged and there is no gross tumor that would lead the excision.  I also brought up the idea of radiation to the patient and her daughter and have advised them to go speak with Dr. Isidore Moos with radiation oncology.  That could be an easier and potentially more effective way of reducing her chances of recurrence given the above limitations of surgical reexcision.  We will plan to see the patient back in 2 weeks to remove the sutures and staples and will reevaluate the plan at that time.

## 2020-06-07 NOTE — Progress Notes (Signed)
Oncology Nurse Navigator Documentation  Placed introductory call to new referral patient Arrie Borrelli  Introduced myself as the H&N oncology nurse navigator that works with Dr. Isidore Moos to whom she has been referred by Dr. Claudia Desanctis. She confirmed understanding of referral.  Briefly explained my role as his navigator, provided my contact information.   Confirmed understanding of upcoming appts and [facility] location, explained arrival and registration process.    I encouraged her to call with questions/concerns as she moves forward with appts and procedures.    She verbalized understanding of information provided, expressed appreciation for my call.   Navigator Initial Assessment  Employment Status:she is retired.   Currently on FMLA / STD:no  Living Situation:She lives alone.   Support System: daughter  PCP: Claretta Fraise  PCD:na  Financial Concerns:na  Transportation Needs: no  Radiation protection practitioner Needed:  no  Ambulation Needs: no  DME Used in Home: no  Psychosocial Needs:  no  Concerns/Needs Understanding Cancer:  addressed/answered by navigator to best of ability  Self-Expressed Needs: no   Harlow Asa RN, BSN, OCN Head & Neck Oncology Nurse [place] [facility] Phone # [phone]  Fax # [phone]

## 2020-06-10 NOTE — Progress Notes (Signed)
Radiation Oncology         [phone] ________________________________  Initial Outpatient Consultation  Name: Sophia [name] MRN: [id number]  Date: 06/11/2020  DOB: [birth year]  LE:XNTZGY, [name], MD  Cindra Presume, MD   REFERRING PHYSICIAN: Cindra Presume, MD  DIAGNOSIS:    ICD-10-CM   1. Squamous cell carcinoma of scalp  C44.42     Cancer Staging Squamous cell carcinoma of scalp Staging form: Cutaneous Carcinoma of the Head and Neck, AJCC 8th Edition - Pathologic: Stage III (pT3, pN0, cM0) - Signed by Eppie Gibson, MD on 06/11/2020   CHIEF COMPLAINT: Here to discuss management of skin cancer  HISTORY OF PRESENT ILLNESS::Sophia [name] is a 87 y.o. female who presented with one-year history of growing scalp lesion.  Subsequently, the patient was seen by Dr. Claudia Desanctis, who performed a skin biopsy on 04/24/2020. 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 The inked deep margin was very focally positive for carcinoma on the central region. Peripheral margins were negative and there was no evidence of lymphovascular or perineural invasion.   Dr. Claudia Desanctis and I have corresponded about this case.  According to Dr. Claudia Desanctis, "I took 1 cm margins on this and dissected down to the paracranial layer and then closed this with a rotational flap.  Intraoperative frozen sections reported negative peripheral and deep margins.  Unfortunately on permanent analysis there was focal deep margin involvement of the tumor.  The pathologist described this as just a few cells at the deep margin.  Clinically the tumor lifted off the pericranium with no gross involvement."  The patient understands based on her discussion with Dr. Claudia Desanctis that reexcision would be fairly significant undertaking, requiring her to return to the operating room.  And, it would be difficult to know where the positive margin  truly is.  She lives with her daughter in [place].  She enjoys spending time with her grandchild who is 46 years old.  She denies previous skin cancers.  She reports that the lesion on her head had been present for a year but grew rather quickly before her surgery in the couple weeks leading up to the surgery.  She denies any palpable masses in her neck.  PREVIOUS RADIATION THERAPY: No  PAST MEDICAL HISTORY:  has a past medical history of Cancer ([place]), Chronic kidney disease, Diabetes mellitus without complication ([place]), GERD (gastroesophageal reflux disease), Hyperlipidemia, Hypertension, and Hypothyroidism.    PAST SURGICAL HISTORY: Past Surgical History:  Procedure Laterality Date  . ADJACENT TISSUE TRANSFER/TISSUE REARRANGEMENT N/A 05/21/2020   Procedure: closure with adjacent tissue transfer;  Surgeon: Cindra Presume, MD;  Location: [facility];  Service: Plastics;  Laterality: N/A;  . BASAL CELL CARCINOMA EXCISION N/A 05/21/2020   Procedure: excision of scalp squamous cell carcinoma with frozen sections;  Surgeon: Cindra Presume, MD;  Location: [facility];  Service: Plastics;  Laterality: N/A;  1.5 hours total  . CHOLECYSTECTOMY    . COLONOSCOPY    . ESOPHAGOGASTRODUODENOSCOPY N/A 04/29/2015   Procedure: ESOPHAGOGASTRODUODENOSCOPY (EGD);  Surgeon: Clarene Essex, MD;  Location: [facility];  Service: Endoscopy;  Laterality: N/A;  . EYE SURGERY Right 01/19/2019   blocked tear duct   . SIGMOIDOSCOPY      FAMILY HISTORY: family history includes Colon cancer in her sister; Heart disease in her brother, father, and sister.  SOCIAL HISTORY:  reports that she has never smoked. She has never used smokeless  tobacco. She reports that she does not drink alcohol and does not use drugs.  ALLERGIES: Patient has no known allergies.  MEDICATIONS:  Current Outpatient Medications  Medication Sig Dispense Refill  . alendronate (FOSAMAX) 70 MG tablet Take 1 tablet (70 mg total) by mouth every 7 (seven) days.  Take with a full glass of water on an empty stomach. (Patient taking differently: Take 70 mg by mouth every Sunday. Take with a full glass of water on an empty stomach.) 4 tablet 5  . amLODipine (NORVASC) 5 MG tablet Take 1 tablet (5 mg total) by mouth daily. 90 tablet 3  . blood glucose meter kit and supplies KIT Use up to 4x daily prn 1 each 0  . Calcium-Magnesium 500-250 MG TABS Take 1 tablet by mouth daily with supper. 30 each 5  . carvedilol (COREG) 25 MG tablet TAKE 1 TABLET BY MOUTH TWICE DAILY WITH A MEAL (Patient taking differently: Take 25 mg by mouth 2 (two) times daily with a meal.) 180 tablet 1  . Cholecalciferol (DIALYVITE VITAMIN D 5000) 125 MCG (5000 UT) capsule Take 5,000 Units by mouth daily.    . ferrous sulfate 325 (65 FE) MG tablet Take 1 tablet (325 mg total) by mouth daily with breakfast.    . ibuprofen (ADVIL) 200 MG tablet Take 400 mg by mouth every 6 (six) hours as needed for headache or moderate pain.    Marland Kitchen levothyroxine (SYNTHROID) 75 MCG tablet TAKE 1 TABLET BY MOUTH IN THE MORNING ON AN EMPTY STOMACH WAIT  ONE  HOUR  BEFORE  EATING (Patient taking differently: Take 75 mcg by mouth daily before breakfast.) 90 tablet 1  . Magnesium 250 MG TABS Take 250 mg by mouth daily.    . metFORMIN (GLUCOPHAGE-XR) 500 MG 24 hr tablet Take 1 tablet by mouth once daily with breakfast 90 tablet 0  . olmesartan (BENICAR) 40 MG tablet Take 1 tablet (40 mg total) by mouth daily. (Please make 6 mos Aug appt) 90 tablet 1  . omeprazole (PRILOSEC) 40 MG capsule TAKE 1 CAPSULE ON AN EMPTY STOMACH 1 HOUR BEFORE BREAKFAST AND ANOTHER AT BEDTIME (2 HOURS AFTER YOUR LAST FOOD AND DRINK EXCEPT WATER) (Patient taking differently: Take 40 mg by mouth 2 (two) times daily.) 180 capsule 1  . simvastatin (ZOCOR) 40 MG tablet TAKE 1/2 (ONE-HALF) TABLET BY MOUTH ONCE DAILY AT  6PM (Patient taking differently: Take 20 mg by mouth daily with supper.) 90 tablet 1  . solifenacin (VESICARE) 10 MG tablet Take 1 tablet  (10 mg total) by mouth daily. For urinary control (Patient taking differently: Take 10 mg by mouth daily as needed (Urinary urgency). For urinary control) 90 tablet 1  . vitamin B-12 (CYANOCOBALAMIN) 1000 MCG tablet Take 1,000 mcg by mouth daily.    . ondansetron (ZOFRAN) 4 MG tablet Take 1 tablet (4 mg total) by mouth every 8 (eight) hours as needed for nausea or vomiting. (Patient not taking: Reported on 06/11/2020) 20 tablet 0   No current facility-administered medications for this encounter.    REVIEW OF SYSTEMS:  Notable for that above.   PHYSICAL EXAM:  height is '5\' 2"'  (1.575 m) and weight is 125 lb 2 oz (56.8 kg). Her temporal temperature is 96.8 F (36 C) (abnormal). Her blood pressure is 143/55 (abnormal) and her pulse is 55 (abnormal). Her respiration is 18 and oxygen saturation is 100%.   General: Alert and oriented, in no acute distress  HEENT: Head is normocephalic.  Surgical  scar is healing well over the frontal scalp Neck: Neck is supple, no palpable cervical, occipital, periauricular, or supraclavicular lymphadenopathy. Psychiatric: Judgment and insight are intact. Affect is appropriate.   ECOG = 1  0 - Asymptomatic (Fully active, able to carry on all predisease activities without restriction)  1 - Symptomatic but completely ambulatory (Restricted in physically strenuous activity but ambulatory and able to carry out work of a light or sedentary nature. For example, light housework, office work)  2 - Symptomatic, <50% in bed during the day (Ambulatory and capable of all self care but unable to carry out any work activities. Up and about more than 50% of waking hours)  3 - Symptomatic, >50% in bed, but not bedbound (Capable of only limited self-care, confined to bed or chair 50% or more of waking hours)  4 - Bedbound (Completely disabled. Cannot carry on any self-care. Totally confined to bed or chair)  5 - Death   Eustace Pen MM, Creech RH, Tormey DC, et al. [phone]). "Toxicity  and response criteria of the [facility]". [place] Oncol. 5 (6): 649-55   LABORATORY DATA:  Lab Results  Component Value Date   WBC 6.5 03/06/2020   HGB 11.9 03/06/2020   HCT 34.8 03/06/2020   MCV 100 (H) 03/06/2020   PLT 251 03/06/2020   CMP     Component Value Date/Time   NA 140 05/21/2020 1119   NA 142 03/06/2020 1344   K 4.0 05/21/2020 1119   CL 107 05/21/2020 1119   CO2 23 05/21/2020 1119   GLUCOSE 102 (H) 05/21/2020 1119   BUN 22 05/21/2020 1119   BUN 16 03/06/2020 1344   CREATININE 1.19 (H) 05/21/2020 1119   CREATININE 1.28 (H) 10/24/2012 1139   CALCIUM 9.5 05/21/2020 1119   PROT 6.2 03/06/2020 1344   ALBUMIN 4.0 03/06/2020 1344   AST 14 03/06/2020 1344   ALT 9 03/06/2020 1344   ALKPHOS 58 03/06/2020 1344   BILITOT 0.3 03/06/2020 1344   GFRNONAA 45 (L) 05/21/2020 1119   GFRNONAA 40 (L) 10/24/2012 1139   GFRAA 57 (L) 03/06/2020 1344   GFRAA 46 (L) 10/24/2012 1139      RADIOGRAPHY: No results found.    IMPRESSION/PLAN: Skin cancer  Today, I talked to the patient about the findings and work-up thus far. We discussed the patient's diagnosis of locally advanced squamous cell carcinoma of the scalp with focal positive margin, but negative for perineural invasion and lymphovascular space invasion.  We discussed the general adjuvant treatment for this, highlighting the role of radiotherapy in the management. We discussed the available radiation techniques, and focused on the details of logistics and delivery.    She understands that due to her focally positive margin, as well as the locally advanced nature of her cancer, that she is at risk for local recurrence.  However, it is possible that all of the disease was cleared and that she is actually cured.  She is not keen on undergoing reexcision.  Therefore we talked about 2 other options:  The first option would be adjuvant radiation therapy.  This would take place over about 4 weeks and  she would come in 5 days a week to receive superficial radiation therapy to the frontal scalp.  This would probably cause some temporary fatigue and skin irritation.  Most notably it would cause permanent hair loss in the frontal scalp over several centimeters.  The risk of permanent cognitive changes would be low but I did  acknowledge that she would receive some superficial dose to her brain.  Alternatively, she can undergo close observation with her skin surgeon or dermatologist.  If she notices any signs of nodularity or skin changes in the scalp, I explained to the patient and her daughter that it is very important that they seek an appointment to get it inspected.  We also discussed the importance of seeking medical attention if she has any swollen glands or lumps in her neck that last for more than 2 weeks.  She understands that we would be happy to refer her to an otolaryngologist for a full work-up if this ever is the case.  After long discussion, the patient and her daughter are enthusiastic about close observation.  I think that this is a good option.  We will refer her to dermatology so that she can get full skin exams on a regular basis.  She will also follow-up tomorrow with Dr. Claudia Desanctis and talk to him about short-term versus long-term follow-up plans with him.  I will see her back on an as-needed basis.  I called Dr. Claudia Desanctis and left a message for him about the plan above.  We discussed measures to reduce the risk of infection during the COVID-19 pandemic.  She is due for her booster shot.  I offered to schedule her for a booster shot here at the cancer center today.  She gratefully accepted this offer.   On date of service, in total, I spent 50 minutes on this encounter. Patient was seen in person.   __________________________________________   Eppie Gibson, MD  This document serves as a record of services personally performed by Eppie Gibson, MD. It was created on his behalf by Clerance Lav, a trained medical scribe. The creation of this record is based on the scribe's personal observations and the provider's statements to them. This document has been checked and approved by the attending provider.

## 2020-06-10 NOTE — Progress Notes (Signed)
Histology and Location of Primary Skin Cancer: Invasive squamous cell carcinoma of scalp  Sophia [name] presented with the following signs/symptoms: patient's scalp lesion had been present for about a year, and growing in size. She underwent punch biopsy with Dr. Claudia Desanctis on 04/24/2020 which revealed invasive squamous cell carcinoma  Biopsies revealed:  05/21/2020 FINAL MICROSCOPIC DIAGNOSIS:  A. SKIN, SCALP, EXCISION:  - Invasive moderately differentiated keratinizing squamous cell  carcinoma, 4.5 cm  - Carcinoma invades for a maximum depth of 1.2 cm  - The inked deep margin is very focally positive for carcinoma in the  central region  - Peripheral margins are negative  - No evidence of lymphovascular or perineural invasion  - See comment COMMENT:  The deep margin is focally positive only in the permanent sections  submitted from the central region of the tumor. All margins in the  tissue submitted for frozen section are negative.  Past/Anticipated interventions by patient's surgeon/dermatologist for current problematic lesion, if any:  05/21/2020 Dr. Mingo Amber (plastic surgeon) 1.  Excision of scalp squamous cell carcinoma totaling 7 x 5 cm 2.  Closure of scalp defect with adjacent tissue transfer totaling 13 x 13 cm including the primary and secondary defect.  Past skin cancers, if any:  None  History of Blistering sunburns, if any: Yes--when she was younger and went on beach trips  SAFETY ISSUES:  Prior radiation? No  Pacemaker/ICD? No  Possible current pregnancy? No--postmenopausal   Is the patient on methotrexate? No  Current Complaints / other details:   Patient has received both Moderna vaccines and annual flu shot. She has not received booster yet

## 2020-06-11 ENCOUNTER — Ambulatory Visit: Payer: Medicare Other

## 2020-06-11 ENCOUNTER — Other Ambulatory Visit: Payer: Self-pay

## 2020-06-11 ENCOUNTER — Ambulatory Visit
Admission: RE | Admit: 2020-06-11 | Discharge: 2020-06-11 | Disposition: A | Discharge status: Home / Self Care | Payer: Medicare Other | Source: Ambulatory Visit | Attending: Radiation Oncology | Admitting: Radiation Oncology

## 2020-06-11 ENCOUNTER — Encounter: Payer: Self-pay | Admitting: Radiation Oncology

## 2020-06-11 VITALS — BP 143/55 | HR 55 | Temp 96.8°F | Resp 18 | Ht 62.0 in | Wt 125.1 lb

## 2020-06-11 DIAGNOSIS — C4442 Squamous cell carcinoma of skin of scalp and neck: Secondary | ICD-10-CM | POA: Insufficient documentation

## 2020-06-11 DIAGNOSIS — I10 Essential (primary) hypertension: Secondary | ICD-10-CM | POA: Diagnosis not present

## 2020-06-11 DIAGNOSIS — Z79899 Other long term (current) drug therapy: Secondary | ICD-10-CM | POA: Insufficient documentation

## 2020-06-11 DIAGNOSIS — Z23 Encounter for immunization: Secondary | ICD-10-CM | POA: Insufficient documentation

## 2020-06-11 DIAGNOSIS — E039 Hypothyroidism, unspecified: Secondary | ICD-10-CM | POA: Insufficient documentation

## 2020-06-11 DIAGNOSIS — Z800 Family history of malignant neoplasm of digestive organs: Secondary | ICD-10-CM | POA: Diagnosis not present

## 2020-06-11 DIAGNOSIS — Z7984 Long term (current) use of oral hypoglycemic drugs: Secondary | ICD-10-CM | POA: Diagnosis not present

## 2020-06-11 DIAGNOSIS — E785 Hyperlipidemia, unspecified: Secondary | ICD-10-CM | POA: Diagnosis not present

## 2020-06-11 DIAGNOSIS — E119 Type 2 diabetes mellitus without complications: Secondary | ICD-10-CM | POA: Insufficient documentation

## 2020-06-11 DIAGNOSIS — N189 Chronic kidney disease, unspecified: Secondary | ICD-10-CM | POA: Diagnosis not present

## 2020-06-11 DIAGNOSIS — K219 Gastro-esophageal reflux disease without esophagitis: Secondary | ICD-10-CM | POA: Diagnosis not present

## 2020-06-11 NOTE — Progress Notes (Signed)
Oncology Nurse Navigator Documentation  Met with patient during initial consult with Dr. Isidore Moos. She was accompanied by her daughter, [name]. . Further introduced myself as her/their Navigator, explained my role as a member of the Care Team. . Assisted with post-consult appt scheduling. . They verbalized understanding of information provided. . I placed a referral to Dermatology. Sophia [name] and her daughter are aware that they will receive a phone call to set up an appointment for twice yearly skin checks.  . I encouraged them to call with questions/concerns moving forward.  Harlow Asa, RN, BSN, OCN Head & Neck Oncology Nurse [place] [facility] [phone]

## 2020-06-11 NOTE — Progress Notes (Signed)
mbulatory 

## 2020-06-11 NOTE — Progress Notes (Signed)
   Covid-19 Vaccination Clinic  Name:  Sophia [name]    MRN: [id number] DOB: Aug 02, 1931  06/11/2020  Sophia [name] was observed post Covid-19 immunization for 15 minutes without incident. She was provided with Vaccine Information Sheet and instruction to access the V-Safe system.   Sophia [name] was instructed to call 911 with any severe reactions post vaccine: Marland Kitchen Difficulty breathing  . Swelling of face and throat  . A fast heartbeat  . A bad rash all over body  . Dizziness and weakness

## 2020-06-12 ENCOUNTER — Encounter: Payer: Self-pay | Admitting: Surgical

## 2020-06-12 ENCOUNTER — Ambulatory Visit (INDEPENDENT_AMBULATORY_CARE_PROVIDER_SITE_OTHER): Payer: Medicare Other | Admitting: Surgical

## 2020-06-12 ENCOUNTER — Other Ambulatory Visit: Payer: Self-pay

## 2020-06-12 VITALS — BP 147/71 | HR 61 | Temp 97.7°F

## 2020-06-12 DIAGNOSIS — C4442 Squamous cell carcinoma of skin of scalp and neck: Secondary | ICD-10-CM

## 2020-06-12 DIAGNOSIS — L989 Disorder of the skin and subcutaneous tissue, unspecified: Secondary | ICD-10-CM

## 2020-06-12 NOTE — Progress Notes (Signed)
Patient is an 87 year old female here for follow-up after excision of scalp squamous cell carcinoma with frozen sections and rotational flap with Dr. Claudia Desanctis on 05/21/2020.  She is 3 weeks postop.  Patient is here with her granddaughter. Initially her frozen sections were negative, however unfortunately on permanent analysis there was focal deep margin involvement of the tumor.  Patient has been seen by radiation oncology for consultation to further discuss possibility for radiation given the positive margin.  Per EMR review patient was provided with the option for close observation or radiation therapy, patient and daughter have decided on close observation at this time.  On exam patient scalp is healing really well, PDS sutures and a few staples are in place.  There is no erythema.  There is no dehiscence noted.  She does have some scabbing, but no drainage noted.  It is not tender to palpation.  She does have some numbness.    PDS sutures and staples were removed, patient tolerated this fine.  We discussed observation options and she reports that she has been referred to dermatology for yearly skin checks.  I discussed with her that if she would like to follow-up with [place] or has any questions or concerns we would be happy to see her again.  I do think that following up with dermatology would be a great option for her as she is at an increased risk of additional skin cancer lesions due to her history.  She does have 2 lesions on her posterior scalp that she is concerned about and she is going to discuss this with the dermatologist.  I discussed with her that if she is not scheduled to see the dermatologist within the next 3 to 6 months that we would be happy to see her back and biopsy the area has.  Patient is in agreement with this plan.  Everything appears stable, there is no sign of infection, seroma.  I recommend they call us with any questions or concerns.  Pictures were obtained of the patient and  placed in the chart with the patient's or guardian's permission.

## 2020-06-21 ENCOUNTER — Ambulatory Visit: Payer: Medicare Other | Admitting: Licensed Clinical Social Worker

## 2020-06-21 DIAGNOSIS — E559 Vitamin D deficiency, unspecified: Secondary | ICD-10-CM

## 2020-06-21 DIAGNOSIS — E119 Type 2 diabetes mellitus without complications: Secondary | ICD-10-CM

## 2020-06-21 DIAGNOSIS — M810 Age-related osteoporosis without current pathological fracture: Secondary | ICD-10-CM

## 2020-06-21 DIAGNOSIS — R739 Hyperglycemia, unspecified: Secondary | ICD-10-CM

## 2020-06-21 DIAGNOSIS — I10 Essential (primary) hypertension: Secondary | ICD-10-CM

## 2020-06-21 DIAGNOSIS — N182 Chronic kidney disease, stage 2 (mild): Secondary | ICD-10-CM

## 2020-06-21 DIAGNOSIS — E782 Mixed hyperlipidemia: Secondary | ICD-10-CM

## 2020-06-21 NOTE — Patient Instructions (Addendum)
Licensed Clinical Social Worker Visit Information  Materials Provided: No  06/21/2020  Name: Sophia [name]    MRN: [id number]       DOB: 1931-06-01  Sophia [name] is a 87 y.o. year old female who is a primary care patient of Sophia [name], Sophia Gash, MD. The CCM team was consulted for assistance with Sophia [name] .   Review of patient status, including review of consultants reports, other relevant assessments, and collaboration with appropriate care team members and the patient's provider was performed as part of comprehensive patient evaluation and provision of chronic care management services.    SDOH (Social Determinants of Health) assessments performed: No; risk for depression; risk for tobacco use; risk for stress; risk for physical inactivity  [name] called client home phone number several times today but [name] was not able to speak via phone today with client. [name] did leave phone message for Sophia [name] today requesting that she call [name] at [phone]. [name] also called Sophia [name], daughter of client, but [name] was not able to speak via phone today with Sophia [name]. Phone message was not set up on Sophia [name]'s phone and thus [name] could not leave phone message for Sophia [name] today.  Follow Up Plan: [name] to call client /daughter of client in next 4 weeks to assess client needs at that time  [name] was not able to speak today via phone with client or daughter of client; thus , client or  her daughter were not able to verbalize understanding of instructions provided today and were not able to accept or decline a print copy of patient instruction materials.   Sophia [name].Sophia [name] MSW, [name] Licensed Clinical Social Worker [facility] [phone]

## 2020-06-21 NOTE — Chronic Care Management (AMB) (Signed)
Chronic Care Management    Clinical Social Work Follow Up Note  06/21/2020 Name: Sophia [name] MRN: [id number] DOB: May 12, 1932  Sophia [name] is a 87 y.o. year old female who is a primary care patient of [name], Sophia Gash, MD. The CCM team was consulted for assistance with [name]   Review of patient status, including review of consultants reports, other relevant assessments, and collaboration with appropriate care team members and the patient's provider was performed as part of comprehensive patient evaluation and provision of chronic care management services.    SDOH (Social Determinants of Health) assessments performed: No; risk for depression; risk for tobacco use; risk for stress; risk for physical inactivity  Flowsheet Row Chronic Care Management from 12/29/2019 in [facility]  PHQ-9 Total Score 2      GAD 7 : Generalized Anxiety Score 12/29/2019 04/26/2015  Nervous, Anxious, on Edge 0 1  Control/stop worrying 0 1  Worry too much - different things 0 1  Trouble relaxing 0 1  Restless 0 0  Easily annoyed or irritable 0 2  Afraid - awful might happen 0 1  Total GAD 7 Score 0 7  Anxiety Difficulty Somewhat difficult Not difficult at all    Outpatient Encounter Medications as of 06/21/2020  Medication Sig Note  . alendronate (FOSAMAX) 70 MG tablet Take 1 tablet (70 mg total) by mouth every 7 (seven) days. Take with a full glass of water on an empty stomach. (Patient taking differently: Take 70 mg by mouth every Sunday. Take with a full glass of water on an empty stomach.) 05/13/2020: Pt states she skips one dose per month about every month so she averages about 3 doses per month  . amLODipine (NORVASC) 5 MG tablet Take 1 tablet (5 mg total) by mouth daily.   . blood glucose meter kit and supplies KIT Use up to 4x daily prn   . Calcium-Magnesium 500-250 MG TABS Take 1 tablet by mouth daily with supper.   . carvedilol (COREG) 25 MG tablet TAKE  1 TABLET BY MOUTH TWICE DAILY WITH A MEAL (Patient taking differently: Take 25 mg by mouth 2 (two) times daily with a meal.)   . Cholecalciferol (DIALYVITE VITAMIN D 5000) 125 MCG (5000 UT) capsule Take 5,000 Units by mouth daily.   . ferrous sulfate 325 (65 FE) MG tablet Take 1 tablet (325 mg total) by mouth daily with breakfast.   . ibuprofen (ADVIL) 200 MG tablet Take 400 mg by mouth every 6 (six) hours as needed for headache or moderate pain.   Marland Kitchen levothyroxine (SYNTHROID) 75 MCG tablet TAKE 1 TABLET BY MOUTH IN THE MORNING ON AN EMPTY STOMACH WAIT  ONE  HOUR  BEFORE  EATING (Patient taking differently: Take 75 mcg by mouth daily before breakfast.)   . Magnesium 250 MG TABS Take 250 mg by mouth daily.   . metFORMIN (GLUCOPHAGE-XR) 500 MG 24 hr tablet Take 1 tablet by mouth once daily with breakfast   . olmesartan (BENICAR) 40 MG tablet Take 1 tablet (40 mg total) by mouth daily. (Please make 6 mos Aug appt)   . omeprazole (PRILOSEC) 40 MG capsule TAKE 1 CAPSULE ON AN EMPTY STOMACH 1 HOUR BEFORE BREAKFAST AND ANOTHER AT BEDTIME (2 HOURS AFTER YOUR LAST FOOD AND DRINK EXCEPT WATER) (Patient taking differently: Take 40 mg by mouth 2 (two) times daily.)   . ondansetron (ZOFRAN) 4 MG tablet Take 1 tablet (4 mg total) by mouth every 8 (eight) hours as needed  for nausea or vomiting. (Patient not taking: Reported on 06/11/2020) 05/13/2020: Hasnt started  . simvastatin (ZOCOR) 40 MG tablet TAKE 1/2 (ONE-HALF) TABLET BY MOUTH ONCE DAILY AT  6PM (Patient taking differently: Take 20 mg by mouth daily with supper.)   . solifenacin (VESICARE) 10 MG tablet Take 1 tablet (10 mg total) by mouth daily. For urinary control (Patient taking differently: Take 10 mg by mouth daily as needed (Urinary urgency). For urinary control)   . vitamin B-12 (CYANOCOBALAMIN) 1000 MCG tablet Take 1,000 mcg by mouth daily.    No facility-administered encounter medications on file as of 06/21/2020.    [name] called client home phone number  several times today but [name] was not able to speak via phone today with client. [name] did leave phone message for [name] today requesting that she call [name] at [phone]. [name] also called [name], daughter of client, but [name] was not able to speak via phone today with [name]. Phone message was not set up on [name]'s phone and thus [name] could not leave phone message for [name] today.  Follow Up Plan:  [name] to call client /daughter of client in next 4 weeks to assess client needs at that time  [name].Marceline Napierala MSW, [name] Licensed Clinical Social Worker [facility] [phone]

## 2020-07-25 ENCOUNTER — Telehealth: Payer: Medicare Other

## 2020-07-29 DIAGNOSIS — E119 Type 2 diabetes mellitus without complications: Secondary | ICD-10-CM | POA: Diagnosis not present

## 2020-07-29 DIAGNOSIS — Z961 Presence of intraocular lens: Secondary | ICD-10-CM | POA: Diagnosis not present

## 2020-07-29 DIAGNOSIS — H353132 Nonexudative age-related macular degeneration, bilateral, intermediate dry stage: Secondary | ICD-10-CM | POA: Diagnosis not present

## 2020-07-29 LAB — HM DIABETES EYE EXAM

## 2020-08-26 ENCOUNTER — Telehealth: Payer: Medicare Other

## 2020-09-02 ENCOUNTER — Other Ambulatory Visit: Payer: Self-pay | Admitting: Family Medicine

## 2020-09-02 DIAGNOSIS — E119 Type 2 diabetes mellitus without complications: Secondary | ICD-10-CM

## 2020-09-02 DIAGNOSIS — I10 Essential (primary) hypertension: Secondary | ICD-10-CM

## 2020-09-04 ENCOUNTER — Other Ambulatory Visit: Payer: Self-pay | Admitting: Family Medicine

## 2020-09-04 DIAGNOSIS — E038 Other specified hypothyroidism: Secondary | ICD-10-CM

## 2020-09-19 ENCOUNTER — Ambulatory Visit: Payer: Medicare Other | Admitting: Family Medicine

## 2020-09-23 ENCOUNTER — Encounter: Payer: Self-pay | Admitting: Family Medicine

## 2020-09-23 ENCOUNTER — Ambulatory Visit (INDEPENDENT_AMBULATORY_CARE_PROVIDER_SITE_OTHER): Payer: Medicare Other | Admitting: Family Medicine

## 2020-09-23 ENCOUNTER — Other Ambulatory Visit: Payer: Self-pay

## 2020-09-23 VITALS — BP 121/66 | HR 60 | Temp 97.0°F | Ht 62.0 in | Wt 124.8 lb

## 2020-09-23 DIAGNOSIS — E119 Type 2 diabetes mellitus without complications: Secondary | ICD-10-CM | POA: Diagnosis not present

## 2020-09-23 DIAGNOSIS — J329 Chronic sinusitis, unspecified: Secondary | ICD-10-CM

## 2020-09-23 DIAGNOSIS — E039 Hypothyroidism, unspecified: Secondary | ICD-10-CM

## 2020-09-23 DIAGNOSIS — J40 Bronchitis, not specified as acute or chronic: Secondary | ICD-10-CM

## 2020-09-23 DIAGNOSIS — E782 Mixed hyperlipidemia: Secondary | ICD-10-CM | POA: Diagnosis not present

## 2020-09-23 DIAGNOSIS — I10 Essential (primary) hypertension: Secondary | ICD-10-CM | POA: Diagnosis not present

## 2020-09-23 LAB — BAYER DCA HB A1C WAIVED: HB A1C (BAYER DCA - WAIVED): 6.3 % (ref <7.0)

## 2020-09-23 MED ORDER — MOXIFLOXACIN HCL 400 MG PO TABS: 400.0000 mg | ORAL_TABLET | Freq: Every day | ORAL | 0 refills | Status: DC

## 2020-09-23 NOTE — Progress Notes (Signed)
Subjective:  Patient ID: Sophia [name],  female    DOB: 05/20/1931  Age: 87 y.o.    CC: Medical Management of Chronic Issues   HPI Sophia [name] presents for  follow-up of hypertension. Patient has no history of headache chest pain or shortness of breath or recent cough. Patient also denies symptoms of TIA such as numbness weakness lateralizing. Patient denies side effects from medication. States taking it regularly.  Patient also  in for follow-up of elevated cholesterol. Doing well without complaints on current medication. Denies side effects  including myalgia and arthralgia and nausea. Also in today for liver function testing. Currently no chest pain, shortness of breath or other cardiovascular related symptoms noted.  Follow-up of diabetes. Patient does not check blood sugar at home. Patient denies symptoms such as excessive hunger or urinary frequency, excessive hunger, nausea No significant hypoglycemic spells noted. Medications reviewed. Pt reports taking them regularly. Pt. denies complication/adverse reaction today.   One week of productive cough with purulent sputum. Cough is deep down. Not dyspneic.    History Sophia [name] has a past medical history of Cancer ([place]), Chronic kidney disease, Diabetes mellitus without complication ([place]), GERD (gastroesophageal reflux disease), Hyperlipidemia, Hypertension, and Hypothyroidism.   She has a past surgical history that includes Cholecystectomy; Sigmoidoscopy; Esophagogastroduodenoscopy (N/A, 04/29/2015); Eye surgery (Right, 01/19/2019); Colonoscopy; Excision basal cell carcinoma (N/A, 05/21/2020); and Adjacent tissue transfer/tissue rearrangement (N/A, 05/21/2020).   Her family history includes Colon cancer in her sister; Heart disease in her brother, father, and sister.She reports that she has never smoked. She has never used smokeless tobacco. She reports that she does not drink alcohol and does not use drugs.  Current  Outpatient Medications on File Prior to Visit  Medication Sig Dispense Refill  . alendronate (FOSAMAX) 70 MG tablet Take 1 tablet (70 mg total) by mouth every 7 (seven) days. Take with a full glass of water on an empty stomach. (Patient taking differently: Take 70 mg by mouth every Sunday. Take with a full glass of water on an empty stomach.) 4 tablet 5  . amLODipine (NORVASC) 5 MG tablet Take 1 tablet (5 mg total) by mouth daily. 90 tablet 3  . blood glucose meter kit and supplies KIT Use up to 4x daily prn 1 each 0  . Calcium-Magnesium 500-250 MG TABS Take 1 tablet by mouth daily with supper. 30 each 5  . carvedilol (COREG) 25 MG tablet TAKE 1 TABLET BY MOUTH TWICE DAILY WITH A MEAL (NEEDS TO BE SEEN BEFORE NEXT REFILL) 60 tablet 0  . Cholecalciferol (DIALYVITE VITAMIN D 5000) 125 MCG (5000 UT) capsule Take 5,000 Units by mouth daily.    . ferrous sulfate 325 (65 FE) MG tablet Take 1 tablet (325 mg total) by mouth daily with breakfast.    . ibuprofen (ADVIL) 200 MG tablet Take 400 mg by mouth every 6 (six) hours as needed for headache or moderate pain.    Marland Kitchen levothyroxine (EUTHYROX) 75 MCG tablet Take 1 tablet (75 mcg total) by mouth daily before breakfast. (NEEDS TO BE SEEN BEFORE NEXT REFILL) 90 tablet 0  . Magnesium 250 MG TABS Take 250 mg by mouth daily.    . metFORMIN (GLUCOPHAGE-XR) 500 MG 24 hr tablet Take 1 tablet by mouth once daily with breakfast 90 tablet 0  . olmesartan (BENICAR) 40 MG tablet Take 1 tablet (40 mg total) by mouth daily. (NEEDS TO BE SEEN BEFORE NEXT REFILL) 30 tablet 0  . omeprazole (PRILOSEC) 40 MG capsule TAKE 1  CAPSULE ON AN EMPTY STOMACH 1 HOUR BEFORE BREAKFAST AND ANOTHER AT BEDTIME (2 HOURS AFTER YOUR LAST FOOD AND DRINK EXCEPT WATER) (Patient taking differently: Take 40 mg by mouth 2 (two) times daily.) 180 capsule 1  . ondansetron (ZOFRAN) 4 MG tablet Take 1 tablet (4 mg total) by mouth every 8 (eight) hours as needed for nausea or vomiting. 20 tablet 0  .  simvastatin (ZOCOR) 40 MG tablet TAKE 1/2 (ONE-HALF) TABLET BY MOUTH ONCE DAILY AT  6PM (Patient taking differently: Take 20 mg by mouth daily with supper.) 90 tablet 1  . solifenacin (VESICARE) 10 MG tablet Take 1 tablet (10 mg total) by mouth daily. For urinary control (Patient taking differently: Take 10 mg by mouth daily as needed (Urinary urgency). For urinary control) 90 tablet 1  . vitamin B-12 (CYANOCOBALAMIN) 1000 MCG tablet Take 1,000 mcg by mouth daily.     No current facility-administered medications on file prior to visit.    ROS Review of Systems  Constitutional: Negative.  Negative for activity change, appetite change, chills and fever.  HENT: Positive for congestion, postnasal drip, rhinorrhea and sinus pressure. Negative for ear discharge, ear pain, hearing loss, nosebleeds, sneezing and trouble swallowing.   Eyes: Negative for visual disturbance.  Respiratory: Positive for cough. Negative for chest tightness and shortness of breath.   Cardiovascular: Negative for chest pain and palpitations.  Gastrointestinal: Negative for abdominal pain.  Musculoskeletal: Negative for arthralgias.  Skin: Negative for rash.    Objective:  BP 121/66   Pulse 60   Temp (!) 97 F (36.1 C)   Ht _0  (1.575 m)   Wt 124 lb 12.8 oz (56.6 kg)   SpO2 96%   BMI 22.83 kg/m   BP Readings from Last 3 Encounters:  09/23/20 121/66  06/12/20 (!) 147/71  06/11/20 (!) 143/55    Wt Readings from Last 3 Encounters:  09/23/20 124 lb 12.8 oz (56.6 kg)  06/11/20 125 lb 2 oz (56.8 kg)  05/21/20 121 lb 14.6 oz (55.3 kg)     Physical Exam Constitutional:      General: She is not in acute distress.    Appearance: She is well-developed.  HENT:     Head: Normocephalic and atraumatic.  Eyes:     Conjunctiva/sclera: Conjunctivae normal.     Pupils: Pupils are equal, round, and reactive to light.  Neck:     Thyroid: No thyromegaly.  Cardiovascular:     Rate and Rhythm: Normal rate and regular  rhythm.     Heart sounds: Normal heart sounds. No murmur heard.   Pulmonary:     Effort: Pulmonary effort is normal. No respiratory distress.     Breath sounds: Normal breath sounds. No wheezing or rales.  Abdominal:     General: Bowel sounds are normal. There is no distension.     Palpations: Abdomen is soft.     Tenderness: There is no abdominal tenderness.  Musculoskeletal:        General: Normal range of motion.     Cervical back: Normal range of motion and neck supple.  Lymphadenopathy:     Cervical: No cervical adenopathy.  Skin:    General: Skin is warm and dry.  Neurological:     Mental Status: She is alert and oriented to person, place, and time.  Psychiatric:        Behavior: Behavior normal.        Thought Content: Thought content normal.  Judgment: Judgment normal.     Diabetic Foot Exam - Simple   Simple Foot Form Diabetic Foot exam was performed with the following findings: Yes 09/23/2020  3:41 PM  Visual Inspection No deformities, no ulcerations, no other skin breakdown bilaterally: Yes Sensation Testing Intact to touch and monofilament testing bilaterally: Yes Pulse Check Posterior Tibialis and Dorsalis pulse intact bilaterally: Yes Comments       Assessment & Plan:   Sophia [name] was seen today for medical management of chronic issues.  Diagnoses and all orders for this visit:  Hypothyroidism, unspecified type -     Thyroid Panel With TSH  Diabetes mellitus without complication ([place]) -     Bayer DCA Hb A1c Waived -     CMP14+EGFR -     CBC with Differential/Platelet  Mixed hyperlipidemia -     Lipid panel  Essential (primary) hypertension  Sinobronchitis  Other orders -     moxifloxacin (AVELOX) 400 MG tablet; Take 1 tablet (400 mg total) by mouth daily. Take all of these, for infection   I am having Sophia [name] start on moxifloxacin. I am also having her maintain her Calcium-Magnesium, blood glucose meter kit and supplies,  ferrous sulfate, alendronate, amLODipine, omeprazole, simvastatin, solifenacin, ondansetron, vitamin B-12, Magnesium, Dialyvite Vitamin D 5000, ibuprofen, metFORMIN, carvedilol, olmesartan, and levothyroxine.  Meds ordered this encounter  Medications  . moxifloxacin (AVELOX) 400 MG tablet    Sig: Take 1 tablet (400 mg total) by mouth daily. Take all of these, for infection    Dispense:  7 tablet    Refill:  0     Follow-up: No follow-ups on file.  Claretta Fraise, M.D.

## 2020-09-24 LAB — CBC WITH DIFFERENTIAL/PLATELET
Basophils Absolute: 0 10*3/uL (ref 0.0–0.2)
Basos: 0 %
EOS (ABSOLUTE): 0.2 10*3/uL (ref 0.0–0.4)
Eos: 2 %
Hematocrit: 34.3 % (ref 34.0–46.6)
Hemoglobin: 11.6 g/dL (ref 11.1–15.9)
Immature Grans (Abs): 0 10*3/uL (ref 0.0–0.1)
Immature Granulocytes: 0 %
Lymphocytes Absolute: 2 10*3/uL (ref 0.7–3.1)
Lymphs: 19 %
MCH: 32.5 pg (ref 26.6–33.0)
MCHC: 33.8 g/dL (ref 31.5–35.7)
MCV: 96 fL (ref 79–97)
Monocytes Absolute: 0.8 10*3/uL (ref 0.1–0.9)
Monocytes: 7 %
Neutrophils Absolute: 7.7 10*3/uL — ABNORMAL HIGH (ref 1.4–7.0)
Neutrophils: 72 %
Platelets: 295 10*3/uL (ref 150–450)
RBC: 3.57 x10E6/uL — ABNORMAL LOW (ref 3.77–5.28)
RDW: 11.8 % (ref 11.7–15.4)
WBC: 10.8 10*3/uL (ref 3.4–10.8)

## 2020-09-24 LAB — LIPID PANEL
Chol/HDL Ratio: 3.4 ratio (ref 0.0–4.4)
Cholesterol, Total: 186 mg/dL (ref 100–199)
HDL: 54 mg/dL (ref >39)
LDL Chol Calc (NIH): 106 mg/dL — ABNORMAL HIGH (ref 0–99)
Triglycerides: 146 mg/dL (ref 0–149)
VLDL Cholesterol Cal: 26 mg/dL (ref 5–40)

## 2020-09-24 LAB — CMP14+EGFR
ALT: 8 IU/L (ref 0–32)
AST: 18 IU/L (ref 0–40)
Albumin/Globulin Ratio: 1.5 (ref 1.2–2.2)
Albumin: 4 g/dL (ref 3.6–4.6)
Alkaline Phosphatase: 67 IU/L (ref 44–121)
BUN/Creatinine Ratio: 16 (ref 12–28)
BUN: 20 mg/dL (ref 8–27)
Bilirubin Total: 0.5 mg/dL (ref 0.0–1.2)
CO2: 22 mmol/L (ref 20–29)
Calcium: 9.4 mg/dL (ref 8.7–10.3)
Chloride: 104 mmol/L (ref 96–106)
Creatinine, Ser: 1.22 mg/dL — ABNORMAL HIGH (ref 0.57–1.00)
Globulin, Total: 2.7 g/dL (ref 1.5–4.5)
Glucose: 113 mg/dL — ABNORMAL HIGH (ref 65–99)
Potassium: 4.1 mmol/L (ref 3.5–5.2)
Sodium: 141 mmol/L (ref 134–144)
Total Protein: 6.7 g/dL (ref 6.0–8.5)
eGFR: 43 mL/min/{1.73_m2} — ABNORMAL LOW (ref >59)

## 2020-09-24 LAB — THYROID PANEL WITH TSH
Free Thyroxine Index: 2.2 (ref 1.2–4.9)
T3 Uptake Ratio: 27 % (ref 24–39)
T4, Total: 8 ug/dL (ref 4.5–12.0)
TSH: 1.69 u[IU]/mL (ref 0.450–4.500)

## 2020-09-24 NOTE — Progress Notes (Signed)
Hello Ericha,  Your lab result is normal and/or stable.Some minor variations that are not significant are commonly marked abnormal, but do not represent any medical problem for you.  Best regards, Mikele Sifuentes, M.D.

## 2020-10-01 ENCOUNTER — Other Ambulatory Visit: Payer: Self-pay | Admitting: Family Medicine

## 2020-10-01 DIAGNOSIS — E119 Type 2 diabetes mellitus without complications: Secondary | ICD-10-CM

## 2020-10-01 DIAGNOSIS — I10 Essential (primary) hypertension: Secondary | ICD-10-CM

## 2020-10-04 ENCOUNTER — Telehealth: Payer: Medicare Other

## 2020-10-09 ENCOUNTER — Other Ambulatory Visit: Payer: Self-pay | Admitting: *Deleted

## 2020-10-09 DIAGNOSIS — K219 Gastro-esophageal reflux disease without esophagitis: Secondary | ICD-10-CM

## 2020-10-09 MED ORDER — OMEPRAZOLE 40 MG PO CPDR: DELAYED_RELEASE_CAPSULE | ORAL | 1 refills | Status: DC

## 2020-10-10 ENCOUNTER — Other Ambulatory Visit: Payer: Self-pay | Admitting: Family Medicine

## 2020-10-10 DIAGNOSIS — I10 Essential (primary) hypertension: Secondary | ICD-10-CM

## 2020-11-14 ENCOUNTER — Ambulatory Visit (INDEPENDENT_AMBULATORY_CARE_PROVIDER_SITE_OTHER): Payer: Medicare Other | Admitting: Licensed Clinical Social Worker

## 2020-11-14 DIAGNOSIS — M810 Age-related osteoporosis without current pathological fracture: Secondary | ICD-10-CM | POA: Diagnosis not present

## 2020-11-14 DIAGNOSIS — N182 Chronic kidney disease, stage 2 (mild): Secondary | ICD-10-CM | POA: Diagnosis not present

## 2020-11-14 DIAGNOSIS — E782 Mixed hyperlipidemia: Secondary | ICD-10-CM | POA: Diagnosis not present

## 2020-11-14 DIAGNOSIS — I10 Essential (primary) hypertension: Secondary | ICD-10-CM | POA: Diagnosis not present

## 2020-11-14 DIAGNOSIS — E119 Type 2 diabetes mellitus without complications: Secondary | ICD-10-CM | POA: Diagnosis not present

## 2020-11-14 NOTE — Chronic Care Management (AMB) (Signed)
Chronic Care Management    Clinical Social Work Note  11/14/2020 Name: Sophia [name] MRN: [id number] DOB: 04-Sep-1931  Sophia [name] is a 87 y.o. year old female who is a primary care patient of [name], [name], MD. The CCM team was consulted to assist the patient with chronic disease management and/or care coordination needs related to: [name]   [name] was not able to engage with patient or her daughter by telephone for follow up visit in response to provider referral for social work chronic care management and care coordination services. However, [name] did leave phone message for client today asking client to please call [name] at [phone] [name] has had some difficulty maintaining communication with client and her daughter in recent months. This past Fall, [name] was able to talk with client and discuss client needs at that time  Consent to Services:  The patient was given information about Chronic Care Management services, agreed to services, and gave verbal consent prior to initiation of services.  Please see initial visit note for detailed documentation.   Patient agreed to services and consent obtained.   Assessment: Review of patient past medical history, allergies, medications, and health status, including review of relevant consultants reports was performed today as part of a comprehensive evaluation and provision of chronic care management and care coordination services.     SDOH (Social Determinants of Health) assessments and interventions performed:  SDOH Interventions    Flowsheet Row Most Recent Value  SDOH Interventions   Depression Interventions/Treatment  --  [informed of [name] support and of [name] support]        Advanced Directives Status: See Vynca application for related entries.  CCM Care Plan  No Known Allergies  Outpatient Encounter Medications as of 11/14/2020  Medication Sig Note   alendronate (FOSAMAX) 70 MG tablet Take 1 tablet (70 mg  total) by mouth every 7 (seven) days. Take with a full glass of water on an empty stomach. (Patient taking differently: Take 70 mg by mouth every Sunday. Take with a full glass of water on an empty stomach.) 05/13/2020: Pt states she skips one dose per month about every month so she averages about 3 doses per month   amLODipine (NORVASC) 5 MG tablet Take 1 tablet (5 mg total) by mouth daily.    blood glucose meter kit and supplies KIT Use up to 4x daily prn    Calcium-Magnesium 500-250 MG TABS Take 1 tablet by mouth daily with supper.    carvedilol (COREG) 25 MG tablet TAKE 1 TABLET BY MOUTH TWICE DAILY WITH MEALS .    Cholecalciferol (DIALYVITE VITAMIN D 5000) 125 MCG (5000 UT) capsule Take 5,000 Units by mouth daily.    ferrous sulfate 325 (65 FE) MG tablet Take 1 tablet (325 mg total) by mouth daily with breakfast.    ibuprofen (ADVIL) 200 MG tablet Take 400 mg by mouth every 6 (six) hours as needed for headache or moderate pain.    levothyroxine (EUTHYROX) 75 MCG tablet Take 1 tablet (75 mcg total) by mouth daily before breakfast. (NEEDS TO BE SEEN BEFORE NEXT REFILL)    Magnesium 250 MG TABS Take 250 mg by mouth daily.    metFORMIN (GLUCOPHAGE-XR) 500 MG 24 hr tablet Take 1 tablet by mouth once daily with breakfast    moxifloxacin (AVELOX) 400 MG tablet Take 1 tablet (400 mg total) by mouth daily. Take all of these, for infection    olmesartan (BENICAR) 40 MG tablet Take 1 tablet (40 mg  total) by mouth daily.    omeprazole (PRILOSEC) 40 MG capsule TAKE 1 CAPSULE ON AN EMPTY STOMACH 1 HOUR BEFORE BREAKFAST AND ANOTHER AT BEDTIME (2 HOURS AFTER YOUR LAST FOOD AND DRINK EXCEPT WATER)    ondansetron (ZOFRAN) 4 MG tablet Take 1 tablet (4 mg total) by mouth every 8 (eight) hours as needed for nausea or vomiting. 05/13/2020: Hasnt started   simvastatin (ZOCOR) 40 MG tablet TAKE 1/2 (ONE-HALF) TABLET BY MOUTH ONCE DAILY AT  6PM (Patient taking differently: Take 20 mg by mouth daily with supper.)     solifenacin (VESICARE) 10 MG tablet Take 1 tablet (10 mg total) by mouth daily. For urinary control (Patient taking differently: Take 10 mg by mouth daily as needed (Urinary urgency). For urinary control)    vitamin B-12 (CYANOCOBALAMIN) 1000 MCG tablet Take 1,000 mcg by mouth daily.    No facility-administered encounter medications on file as of 11/14/2020.    Patient Active Problem List   Diagnosis Date Noted   Squamous cell carcinoma of scalp 06/11/2020   Thyroid activity decreased    Gastrointestinal hemorrhage associated with duodenal ulcer    Diabetes mellitus without complication ([employer])    Chronic kidney disease    Hyperlipidemia 10/24/2012   Essential (primary) hypertension 10/24/2012   Osteoporosis 10/24/2012   Vitamin D deficiency 10/24/2012   Hyperglycemia 10/24/2012   Benign neoplasm of colon 07/20/2011   Diverticulosis of colon (without mention of hemorrhage) 07/20/2011    Conditions to be addressed/monitored: monitor client management of anxiety and depression issues  Care Plan : [name] care plan  Updates made by Katha Cabal, [name] since 11/14/2020 12:00 AM     Problem: Emotional Distress      Goal: Emotional Health Supported; Client to manage anxiety or stress issues faced   Start Date: 11/14/2020  Expected End Date: 02/10/2021  This Visit's Progress: On track  Priority: Medium  Note:   Current Barriers:  Chronic Mental Health needs related to anxiety and stress management Mobility challenges Suicidal Ideation/Homicidal Ideation: No  Clinical Social Work Goal(s):  patient will work with SW monthly by telephone or in person to reduce or manage symptoms related to anxiety and stress issues faced Patient will work with SW monthly by telephone or in person to discuss mobility challenges of client  Interventions: 1:1 collaboration with Claretta Fraise, MD regarding development and update of comprehensive plan of care as evidenced by provider attestation and  co-signature [name] called phone number for [name], daughter of client today; but, [name] was not able to talk via phone with [name] today. Also, phone message service was full and thus [name] could not leave phone message for Polly Cobia called client phone number several times today but was not able to speak via phone with client today.  However, [name] did leave phone message for Sophia [name] today asking her to call [name] at [phone].  [name] has had some difficulty making communication with client or her daughter in recent months.  [name] did talk with Sophia [name] this past Fall and was able to complete note related to client needs at that time.  Patient Self Care Activities:  Self administers medications as prescribed Attends all scheduled provider appointments Performs ADL's independently  Patient Coping Strengths:  Family Friends  Patient Self Care Deficits:  Anxiety and stress issues Mobility issues  Patient Goals:  - spend time or talk with others at least 2 to 3 times per week - practice relaxation or meditation daily - keep a calendar  with appointment dates  Follow Up Plan: [name] to call client on 12/30/20    [name].Zoiee Wimmer MSW, [name] Licensed Clinical Social Worker [facility] Care Management [phone]

## 2020-11-14 NOTE — Patient Instructions (Signed)
Visit Information  PATIENT GOALS:  Goals Addressed             This Visit's Progress    Manage My Emotions; Manage anxiety and stress issues faced       Timeframe:  Short-Term Goal Priority:  Medium Progress: On Track Start Date:             11/14/20                Expected End Date:           02/10/21            Follow Up Date 12/30/20   Manage Emotions: Manage anxiety and stress issues faced    Why is this important?   When you are stressed, down or upset, your body reacts too.  For example, your blood pressure may get higher; you may have a headache or stomachache.  When your emotions get the best of you, your body's ability to fight off cold and flu gets weak.  These steps will help you manage your emotions.      Patient Self Care Activities:  Self administers medications as prescribed Attends all scheduled provider appointments Performs ADL's independently  Patient Coping Strengths:  Family Friends  Patient Self Care Deficits:  Anxiety and stress issues Mobility issues  Patient Goals:  - spend time or talk with others at least 2 to 3 times per week - practice relaxation or meditation daily - keep a calendar with appointment dates  Follow Up Plan: [name] to call client on 12/30/20     Sophia [name] MSW, [name] Licensed Clinical Social Worker [facility] Care Management [phone]

## 2020-12-03 ENCOUNTER — Other Ambulatory Visit: Payer: Self-pay | Admitting: Family Medicine

## 2020-12-03 DIAGNOSIS — E038 Other specified hypothyroidism: Secondary | ICD-10-CM

## 2020-12-30 ENCOUNTER — Telehealth: Payer: Medicare Other

## 2021-02-07 ENCOUNTER — Telehealth: Payer: Medicare Other

## 2021-03-26 ENCOUNTER — Telehealth: Payer: Medicare Other

## 2021-04-04 ENCOUNTER — Ambulatory Visit (INDEPENDENT_AMBULATORY_CARE_PROVIDER_SITE_OTHER): Payer: Medicare Other | Admitting: Family Medicine

## 2021-04-04 ENCOUNTER — Encounter: Payer: Self-pay | Admitting: Family Medicine

## 2021-04-04 VITALS — BP 122/71 | HR 104 | Temp 97.4°F

## 2021-04-04 DIAGNOSIS — J22 Unspecified acute lower respiratory infection: Secondary | ICD-10-CM

## 2021-04-04 DIAGNOSIS — B9689 Other specified bacterial agents as the cause of diseases classified elsewhere: Secondary | ICD-10-CM | POA: Diagnosis not present

## 2021-04-04 MED ORDER — AMOXICILLIN-POT CLAVULANATE 875-125 MG PO TABS: 1.0000 | ORAL_TABLET | Freq: Two times a day (BID) | ORAL | 0 refills | Status: DC

## 2021-04-04 MED ORDER — PREDNISONE 20 MG PO TABS: 20.0000 mg | ORAL_TABLET | Freq: Every day | ORAL | 0 refills | Status: AC

## 2021-04-04 MED ORDER — METHYLPREDNISOLONE ACETATE 40 MG/ML IJ SUSP
40.0000 mg | Freq: Once | INTRAMUSCULAR | Status: DC
Start: 2021-04-04 — End: 2023-08-14

## 2021-04-04 NOTE — Progress Notes (Signed)
Assessment & Plan:  1. Bacterial lower respiratory infection Discussed option of going to the hospital for IV fluids since patient has not been eating and drinking. She prefers not to do this and would like to go home and push fluids herself. Daughter understands if patient does not push the fluids or has decreased urine output, she needs to go to the ER.  - amoxicillin-clavulanate (AUGMENTIN) 875-125 MG tablet; Take 1 tablet by mouth 2 (two) times daily.  Dispense: 20 tablet; Refill: 0 - predniSONE (DELTASONE) 20 MG tablet; Take 1 tablet (20 mg total) by mouth daily with breakfast for 3 days.  Dispense: 3 tablet; Refill: 0 - methylPREDNISolone acetate (DEPO-MEDROL) injection 40 mg   Follow up plan: Return if symptoms worsen or fail to improve.  Hendricks Limes, MSN, APRN, FNP-C [facility]  Subjective:   Patient ID: Sophia [name], female    DOB: 12-Mar-1932, 87 y.o.   MRN: [id number]  HPI: Sophia [name] is a 87 y.o. female presenting on 04/04/2021 for Cough (No fever, hard cough, fatigue, daughter noticed a rattle on exhale, not eating, not drinking)  Patient is accompanied by her daughter, who she is okay with being present.  Patient complains of cough, head/chest congestion, runny nose, postnasal drainage, and feeling very weak. Cough is productive. Onset of symptoms was 6 days ago, gradually worsening since that time. She has had very poor oral intake, but is still urinating. She last urinated this afternoon. She also has a very poor appetite; in the past 6 days she has only eaten two eggs yesterday. Evaluation to date: at home COVID test negative. Treatment to date:  OTC cold medications . She does not smoke. Patient has been fully vaccinated against COVID-19.   ROS: Negative unless specifically indicated above in HPI.   Relevant past medical history reviewed and updated as indicated.   Allergies and medications reviewed and updated.   Current  Outpatient Medications:    amoxicillin-clavulanate (AUGMENTIN) 875-125 MG tablet, Take 1 tablet by mouth 2 (two) times daily., Disp: 20 tablet, Rfl: 0   predniSONE (DELTASONE) 20 MG tablet, Take 1 tablet (20 mg total) by mouth daily with breakfast for 3 days., Disp: 3 tablet, Rfl: 0   alendronate (FOSAMAX) 70 MG tablet, Take 1 tablet (70 mg total) by mouth every 7 (seven) days. Take with a full glass of water on an empty stomach. (Patient taking differently: Take 70 mg by mouth every Sunday. Take with a full glass of water on an empty stomach.), Disp: 4 tablet, Rfl: 5   amLODipine (NORVASC) 5 MG tablet, Take 1 tablet (5 mg total) by mouth daily., Disp: 90 tablet, Rfl: 3   blood glucose meter kit and supplies KIT, Use up to 4x daily prn, Disp: 1 each, Rfl: 0   Calcium-Magnesium 500-250 MG TABS, Take 1 tablet by mouth daily with supper., Disp: 30 each, Rfl: 5   carvedilol (COREG) 25 MG tablet, TAKE 1 TABLET BY MOUTH TWICE DAILY WITH MEALS ., Disp: 60 tablet, Rfl: 5   Cholecalciferol (DIALYVITE VITAMIN D 5000) 125 MCG (5000 UT) capsule, Take 5,000 Units by mouth daily., Disp: , Rfl:    EUTHYROX 75 MCG tablet, TAKE 1 TABLET BY MOUTH ONCE DAILY BEFORE BREAKFAST . APPOINTMENT REQUIRED FOR FUTURE REFILLS, Disp: 90 tablet, Rfl: 1   ferrous sulfate 325 (65 FE) MG tablet, Take 1 tablet (325 mg total) by mouth daily with breakfast., Disp:  , Rfl:    ibuprofen (ADVIL) 200 MG tablet, Take  400 mg by mouth every 6 (six) hours as needed for headache or moderate pain., Disp: , Rfl:    Magnesium 250 MG TABS, Take 250 mg by mouth daily., Disp: , Rfl:    metFORMIN (GLUCOPHAGE-XR) 500 MG 24 hr tablet, Take 1 tablet by mouth once daily with breakfast, Disp: 90 tablet, Rfl: 0   moxifloxacin (AVELOX) 400 MG tablet, Take 1 tablet (400 mg total) by mouth daily. Take all of these, for infection, Disp: 7 tablet, Rfl: 0   olmesartan (BENICAR) 40 MG tablet, Take 1 tablet (40 mg total) by mouth daily., Disp: 90 tablet, Rfl: 1    omeprazole (PRILOSEC) 40 MG capsule, TAKE 1 CAPSULE ON AN EMPTY STOMACH 1 HOUR BEFORE BREAKFAST AND ANOTHER AT BEDTIME (2 HOURS AFTER YOUR LAST FOOD AND DRINK EXCEPT WATER), Disp: 180 capsule, Rfl: 1   ondansetron (ZOFRAN) 4 MG tablet, Take 1 tablet (4 mg total) by mouth every 8 (eight) hours as needed for nausea or vomiting., Disp: 20 tablet, Rfl: 0   simvastatin (ZOCOR) 40 MG tablet, TAKE 1/2 (ONE-HALF) TABLET BY MOUTH ONCE DAILY AT  6PM (Patient taking differently: Take 20 mg by mouth daily with supper.), Disp: 90 tablet, Rfl: 1   solifenacin (VESICARE) 10 MG tablet, Take 1 tablet (10 mg total) by mouth daily. For urinary control (Patient taking differently: Take 10 mg by mouth daily as needed (Urinary urgency). For urinary control), Disp: 90 tablet, Rfl: 1   vitamin B-12 (CYANOCOBALAMIN) 1000 MCG tablet, Take 1,000 mcg by mouth daily., Disp: , Rfl:   Current Facility-Administered Medications:    methylPREDNISolone acetate (DEPO-MEDROL) injection 40 mg, 40 mg, Intramuscular, Once, Hendricks Limes F, FNP  No Known Allergies  Objective:   BP 122/71   Pulse (!) 104   Temp (!) 97.4 F (36.3 C)   SpO2 93%    Physical Exam Vitals reviewed.  Constitutional:      General: She is not in acute distress.    Appearance: Normal appearance. She is not ill-appearing, toxic-appearing or diaphoretic.  HENT:     Head: Normocephalic and atraumatic.     Right Ear: Tympanic membrane, ear canal and external ear normal. There is no impacted cerumen.     Left Ear: Tympanic membrane, ear canal and external ear normal. There is no impacted cerumen.     Mouth/Throat:     Mouth: Mucous membranes are moist.     Pharynx: Oropharynx is clear. No oropharyngeal exudate or posterior oropharyngeal erythema.  Eyes:     General: No scleral icterus.       Right eye: No discharge.        Left eye: No discharge.     Conjunctiva/sclera: Conjunctivae normal.  Cardiovascular:     Rate and Rhythm: Normal rate and regular  rhythm.     Heart sounds: Normal heart sounds. No murmur heard.   No friction rub. No gallop.  Pulmonary:     Effort: Pulmonary effort is normal. No respiratory distress.     Breath sounds: No stridor. Examination of the left-upper field reveals rhonchi. Examination of the left-lower field reveals rales. Rhonchi and rales present. No wheezing.  Musculoskeletal:        General: Normal range of motion.     Cervical back: Normal range of motion.  Lymphadenopathy:     Cervical: No cervical adenopathy.  Skin:    General: Skin is warm and dry.     Capillary Refill: Capillary refill takes less than 2 seconds.  Neurological:  General: No focal deficit present.     Mental Status: She is alert and oriented to person, place, and time. Mental status is at baseline.     Motor: Weakness present.     Gait: Gait abnormal (riding in WC due to weakness).  Psychiatric:        Mood and Affect: Mood normal.        Behavior: Behavior normal.        Thought Content: Thought content normal.        Judgment: Judgment normal.

## 2021-04-11 ENCOUNTER — Other Ambulatory Visit: Payer: Self-pay | Admitting: Family Medicine

## 2021-04-11 DIAGNOSIS — E119 Type 2 diabetes mellitus without complications: Secondary | ICD-10-CM

## 2021-04-11 DIAGNOSIS — I10 Essential (primary) hypertension: Secondary | ICD-10-CM

## 2021-04-29 ENCOUNTER — Encounter: Payer: Self-pay | Admitting: Family Medicine

## 2021-04-29 ENCOUNTER — Ambulatory Visit (INDEPENDENT_AMBULATORY_CARE_PROVIDER_SITE_OTHER): Payer: Medicare Other | Admitting: Family Medicine

## 2021-04-29 VITALS — BP 109/65 | HR 63 | Temp 96.9°F | Ht 62.0 in | Wt 116.8 lb

## 2021-04-29 DIAGNOSIS — K219 Gastro-esophageal reflux disease without esophagitis: Secondary | ICD-10-CM | POA: Diagnosis not present

## 2021-04-29 DIAGNOSIS — E039 Hypothyroidism, unspecified: Secondary | ICD-10-CM | POA: Diagnosis not present

## 2021-04-29 DIAGNOSIS — E119 Type 2 diabetes mellitus without complications: Secondary | ICD-10-CM

## 2021-04-29 DIAGNOSIS — E038 Other specified hypothyroidism: Secondary | ICD-10-CM

## 2021-04-29 DIAGNOSIS — Z23 Encounter for immunization: Secondary | ICD-10-CM

## 2021-04-29 DIAGNOSIS — E782 Mixed hyperlipidemia: Secondary | ICD-10-CM

## 2021-04-29 DIAGNOSIS — R350 Frequency of micturition: Secondary | ICD-10-CM

## 2021-04-29 DIAGNOSIS — R634 Abnormal weight loss: Secondary | ICD-10-CM | POA: Diagnosis not present

## 2021-04-29 DIAGNOSIS — I10 Essential (primary) hypertension: Secondary | ICD-10-CM | POA: Diagnosis not present

## 2021-04-29 LAB — BAYER DCA HB A1C WAIVED: HB A1C (BAYER DCA - WAIVED): 6.1 % — ABNORMAL HIGH (ref 4.8–5.6)

## 2021-04-29 MED ORDER — METFORMIN HCL ER 500 MG PO TB24
500.0000 mg | ORAL_TABLET | Freq: Every day | ORAL | 3 refills | Status: DC
Start: 2021-04-29 — End: 2022-05-21

## 2021-04-29 MED ORDER — CARVEDILOL 25 MG PO TABS
ORAL_TABLET | ORAL | 3 refills | Status: DC
Start: 2021-04-29 — End: 2022-05-21

## 2021-04-29 MED ORDER — LEVOTHYROXINE SODIUM 75 MCG PO TABS
ORAL_TABLET | ORAL | 3 refills | Status: DC
Start: 2021-04-29 — End: 2022-05-21

## 2021-04-29 MED ORDER — OMEPRAZOLE 40 MG PO CPDR
DELAYED_RELEASE_CAPSULE | ORAL | 3 refills | Status: DC
Start: 2021-04-29 — End: 2022-05-21

## 2021-04-29 MED ORDER — SOLIFENACIN SUCCINATE 10 MG PO TABS: 10.0000 mg | ORAL_TABLET | Freq: Every day | ORAL | 3 refills | Status: DC

## 2021-04-29 MED ORDER — AMLODIPINE BESYLATE 5 MG PO TABS: 5.0000 mg | ORAL_TABLET | Freq: Every day | ORAL | 3 refills | Status: DC

## 2021-04-29 MED ORDER — SIMVASTATIN 40 MG PO TABS: ORAL_TABLET | ORAL | 3 refills | Status: DC

## 2021-04-29 NOTE — Progress Notes (Signed)
Subjective:  Patient ID: Sophia [name],  female    DOB: 26-Jun-1931  Age: 87 y.o.    CC: Medical Management of Chronic Issues   HPI Sophia [name] presents for  follow-up of hypertension. Patient has no history of headache chest pain or shortness of breath or recent cough. Patient also denies symptoms of TIA such as numbness weakness lateralizing. Patient denies side effects from medication. States taking it regularly.  Patient also  in for follow-up of elevated cholesterol. Doing well without complaints on current medication. Denies side effects  including myalgia and arthralgia and nausea. Also in today for liver function testing. Currently no chest pain, shortness of breath or other cardiovascular related symptoms noted.  Feels cold all the time. Hands are really cold.  HAs been that way for years. Ran out of iron pills a few weeks ago.  Not eating much.   Light headed when she bends over or stands up. May occur 2-3 times a day.   Follow-up of diabetes.Patient denies symptoms such as excessive hunger or urinary frequency, excessive hunger, nausea No significant hypoglycemic spells noted. Medications reviewed. Pt reports taking them regularly. Pt. denies complication/adverse reaction today.   follow-up on  thyroid. The patient has a history of hypothyroidism for many years. It has been stable recently. Pt. denies any change in  voice, loss of hair, heat or cold intolerance. Energy level has been adequate to good. Patient denies constipation and diarrhea. No myxedema. Medication is as noted below. Verified that pt is taking it daily on an empty stomach. Well tolerated. Patient in for follow-up of GERD. Currently asymptomatic taking  PPI daily. There is no chest pain or heartburn. No hematemesis and no melena. No dysphagia or choking. Onset is remote. Progression is stable. Complicating factors, none.   History Sophia [name] has a past medical history of Cancer ([place]), Chronic kidney  disease, Diabetes mellitus without complication ([place]), GERD (gastroesophageal reflux disease), Hyperlipidemia, Hypertension, and Hypothyroidism.   She has a past surgical history that includes Cholecystectomy; Sigmoidoscopy; Esophagogastroduodenoscopy (N/A, 04/29/2015); Eye surgery (Right, 01/19/2019); Colonoscopy; Excision basal cell carcinoma (N/A, 05/21/2020); and Adjacent tissue transfer/tissue rearrangement (N/A, 05/21/2020).   Her family history includes Colon cancer in her sister; Heart disease in her brother, father, and sister.She reports that she has never smoked. She has never used smokeless tobacco. She reports that she does not drink alcohol and does not use drugs.  Current Outpatient Medications on File Prior to Visit  Medication Sig Dispense Refill   alendronate (FOSAMAX) 70 MG tablet Take 1 tablet (70 mg total) by mouth every 7 (seven) days. Take with a full glass of water on an empty stomach. (Patient taking differently: Take 70 mg by mouth every Sunday. Take with a full glass of water on an empty stomach.) 4 tablet 5   blood glucose meter kit and supplies KIT Use up to 4x daily prn 1 each 0   Calcium-Magnesium 500-250 MG TABS Take 1 tablet by mouth daily with supper. 30 each 5   Cholecalciferol (DIALYVITE VITAMIN D 5000) 125 MCG (5000 UT) capsule Take 5,000 Units by mouth daily.     ferrous sulfate 325 (65 FE) MG tablet Take 1 tablet (325 mg total) by mouth daily with breakfast.     ibuprofen (ADVIL) 200 MG tablet Take 400 mg by mouth every 6 (six) hours as needed for headache or moderate pain.     Magnesium 250 MG TABS Take 250 mg by mouth daily.     olmesartan (BENICAR)  40 MG tablet Take 1 tablet (40 mg total) by mouth daily. (NEEDS TO BE SEEN BEFORE NEXT REFILL) 30 tablet 0   ondansetron (ZOFRAN) 4 MG tablet Take 1 tablet (4 mg total) by mouth every 8 (eight) hours as needed for nausea or vomiting. 20 tablet 0   vitamin B-12 (CYANOCOBALAMIN) 1000 MCG tablet Take 1,000 mcg by mouth  daily.     Current Facility-Administered Medications on File Prior to Visit  Medication Dose Route Frequency Provider Last Rate Last Admin   methylPREDNISolone acetate (DEPO-MEDROL) injection 40 mg  40 mg Intramuscular Once Loman [place], FNP        ROS Review of Systems  Constitutional: Negative.   HENT: Negative.    Eyes:  Negative for visual disturbance.  Respiratory:  Negative for shortness of breath.   Cardiovascular:  Negative for chest pain.  Gastrointestinal:  Negative for abdominal pain.  Musculoskeletal:  Negative for arthralgias.   Objective:  BP 109/65    Pulse 63    Temp (!) 96.9 F (36.1 C)    Ht '5\' 2"'  (1.575 m)    Wt 116 lb 12.8 oz (53 kg)    SpO2 98%    BMI 21.36 kg/m   BP Readings from Last 3 Encounters:  04/29/21 109/65  04/04/21 122/71  09/23/20 121/66    Wt Readings from Last 3 Encounters:  04/29/21 116 lb 12.8 oz (53 kg)  09/23/20 124 lb 12.8 oz (56.6 kg)  06/11/20 125 lb 2 oz (56.8 kg)     Physical Exam Constitutional:      General: She is not in acute distress.    Appearance: She is well-developed.  Cardiovascular:     Rate and Rhythm: Normal rate and regular rhythm.  Pulmonary:     Breath sounds: Normal breath sounds.  Musculoskeletal:        General: Normal range of motion.  Skin:    General: Skin is warm and dry.  Neurological:     Mental Status: She is alert and oriented to person, place, and time.       Results for orders placed or performed in visit on 04/29/21  Bayer DCA Hb A1c Waived  Result Value Ref Range   HB A1C (BAYER DCA - WAIVED) 6.1 (H) 4.8 - 5.6 %     Assessment & Plan:   Sophia [name] was seen today for medical management of chronic issues.  Diagnoses and all orders for this visit:  Diabetes mellitus without complication ([place]) -     Bayer DCA Hb A1c Waived -     CBC with Differential/Platelet -     CMP14+EGFR -     metFORMIN (GLUCOPHAGE-XR) 500 MG 24 hr tablet; Take 1 tablet (500 mg total) by mouth daily with  breakfast.  Mixed hyperlipidemia -     Lipid panel -     simvastatin (ZOCOR) 40 MG tablet; TAKE 1/2 (ONE-HALF) TABLET BY MOUTH ONCE DAILY AT  6PM  Hypothyroidism, unspecified type -     TSH + free T4  Essential (primary) hypertension -     CBC with Differential/Platelet -     CMP14+EGFR -     amLODipine (NORVASC) 5 MG tablet; Take 1 tablet (5 mg total) by mouth daily. -     carvedilol (COREG) 25 MG tablet; TAKE 1 TABLET BY MOUTH TWICE DAILY WITH MEALS .  Other specified hypothyroidism -     levothyroxine (EUTHYROX) 75 MCG tablet; TAKE 1 TABLET BY MOUTH ONCE DAILY BEFORE BREAKFAST .  Gastroesophageal reflux disease without esophagitis -     omeprazole (PRILOSEC) 40 MG capsule; TAKE 1 CAPSULE ON AN EMPTY STOMACH 1 HOUR BEFORE BREAKFAST AND ANOTHER AT BEDTIME (2 HOURS AFTER YOUR LAST FOOD AND DRINK EXCEPT WATER)  Urine frequency -     solifenacin (VESICARE) 10 MG tablet; Take 1 tablet (10 mg total) by mouth daily. For urinary control  Unintended weight loss -     Prealbumin  Need for immunization against influenza -     Flu Vaccine QUAD High Dose(Fluad)  I have discontinued Sophia [name]'s moxifloxacin and amoxicillin-clavulanate. I have changed her Euthyrox to levothyroxine. I have also changed her metFORMIN. Additionally, I am having her maintain her Calcium-Magnesium, blood glucose meter kit and supplies, ferrous sulfate, alendronate, ondansetron, vitamin B-12, Magnesium, Dialyvite Vitamin D 5000, ibuprofen, olmesartan, amLODipine, carvedilol, omeprazole, simvastatin, and solifenacin. We will continue to administer methylPREDNISolone acetate.  Meds ordered this encounter  Medications   amLODipine (NORVASC) 5 MG tablet    Sig: Take 1 tablet (5 mg total) by mouth daily.    Dispense:  90 tablet    Refill:  3   carvedilol (COREG) 25 MG tablet    Sig: TAKE 1 TABLET BY MOUTH TWICE DAILY WITH MEALS .    Dispense:  180 tablet    Refill:  3   levothyroxine (EUTHYROX) 75 MCG  tablet    Sig: TAKE 1 TABLET BY MOUTH ONCE DAILY BEFORE BREAKFAST .    Dispense:  90 tablet    Refill:  3   metFORMIN (GLUCOPHAGE-XR) 500 MG 24 hr tablet    Sig: Take 1 tablet (500 mg total) by mouth daily with breakfast.    Dispense:  90 tablet    Refill:  3   omeprazole (PRILOSEC) 40 MG capsule    Sig: TAKE 1 CAPSULE ON AN EMPTY STOMACH 1 HOUR BEFORE BREAKFAST AND ANOTHER AT BEDTIME (2 HOURS AFTER YOUR LAST FOOD AND DRINK EXCEPT WATER)    Dispense:  180 capsule    Refill:  3   simvastatin (ZOCOR) 40 MG tablet    Sig: TAKE 1/2 (ONE-HALF) TABLET BY MOUTH ONCE DAILY AT  6PM    Dispense:  90 tablet    Refill:  3   solifenacin (VESICARE) 10 MG tablet    Sig: Take 1 tablet (10 mg total) by mouth daily. For urinary control    Dispense:  90 tablet    Refill:  3     Follow-up: Return in about 3 months (around 07/28/2021).  Claretta Fraise, M.D.

## 2021-04-30 LAB — CBC WITH DIFFERENTIAL/PLATELET
Basophils Absolute: 0.1 10*3/uL (ref 0.0–0.2)
Basos: 1 %
EOS (ABSOLUTE): 0.9 10*3/uL — ABNORMAL HIGH (ref 0.0–0.4)
Eos: 13 %
Hematocrit: 31.5 % — ABNORMAL LOW (ref 34.0–46.6)
Hemoglobin: 10.5 g/dL — ABNORMAL LOW (ref 11.1–15.9)
Immature Grans (Abs): 0 10*3/uL (ref 0.0–0.1)
Immature Granulocytes: 1 %
Lymphocytes Absolute: 1.8 10*3/uL (ref 0.7–3.1)
Lymphs: 25 %
MCH: 32.7 pg (ref 26.6–33.0)
MCHC: 33.3 g/dL (ref 31.5–35.7)
MCV: 98 fL — ABNORMAL HIGH (ref 79–97)
Monocytes Absolute: 0.6 10*3/uL (ref 0.1–0.9)
Monocytes: 9 %
Neutrophils Absolute: 3.6 10*3/uL (ref 1.4–7.0)
Neutrophils: 51 %
Platelets: 308 10*3/uL (ref 150–450)
RBC: 3.21 x10E6/uL — ABNORMAL LOW (ref 3.77–5.28)
RDW: 13.1 % (ref 11.7–15.4)
WBC: 7 10*3/uL (ref 3.4–10.8)

## 2021-04-30 LAB — CMP14+EGFR
ALT: 11 IU/L (ref 0–32)
AST: 17 IU/L (ref 0–40)
Albumin/Globulin Ratio: 1.5 (ref 1.2–2.2)
Albumin: 3.5 g/dL — ABNORMAL LOW (ref 3.6–4.6)
Alkaline Phosphatase: 57 IU/L (ref 44–121)
BUN/Creatinine Ratio: 18 (ref 12–28)
BUN: 18 mg/dL (ref 8–27)
Bilirubin Total: <0.2 mg/dL (ref 0.0–1.2)
CO2: 24 mmol/L (ref 20–29)
Calcium: 9.5 mg/dL (ref 8.7–10.3)
Chloride: 105 mmol/L (ref 96–106)
Creatinine, Ser: 1.01 mg/dL — ABNORMAL HIGH (ref 0.57–1.00)
Globulin, Total: 2.3 g/dL (ref 1.5–4.5)
Glucose: 123 mg/dL — ABNORMAL HIGH (ref 70–99)
Potassium: 4.2 mmol/L (ref 3.5–5.2)
Sodium: 142 mmol/L (ref 134–144)
Total Protein: 5.8 g/dL — ABNORMAL LOW (ref 6.0–8.5)
eGFR: 54 mL/min/{1.73_m2} — ABNORMAL LOW (ref >59)

## 2021-04-30 LAB — TSH+FREE T4
Free T4: 0.46 ng/dL — ABNORMAL LOW (ref 0.82–1.77)
TSH: 76.5 u[IU]/mL — ABNORMAL HIGH (ref 0.450–4.500)

## 2021-04-30 LAB — LIPID PANEL
Chol/HDL Ratio: 2.4 ratio (ref 0.0–4.4)
Cholesterol, Total: 142 mg/dL (ref 100–199)
HDL: 58 mg/dL (ref >39)
LDL Chol Calc (NIH): 56 mg/dL (ref 0–99)
Triglycerides: 167 mg/dL — ABNORMAL HIGH (ref 0–149)
VLDL Cholesterol Cal: 28 mg/dL (ref 5–40)

## 2021-05-01 LAB — PREALBUMIN: PREALBUMIN: 17 mg/dL (ref 9–32)

## 2021-05-01 LAB — SPECIMEN STATUS REPORT

## 2021-05-07 NOTE — Progress Notes (Signed)
Hello Loana,  Your lab result is normal and/or stable.Some minor variations that are not significant are commonly marked abnormal, but do not represent any medical problem for you.  Best regards, Conswella Bruney, M.D.

## 2021-05-16 ENCOUNTER — Other Ambulatory Visit: Payer: Self-pay | Admitting: Family Medicine

## 2021-05-16 DIAGNOSIS — E119 Type 2 diabetes mellitus without complications: Secondary | ICD-10-CM

## 2021-05-16 DIAGNOSIS — I10 Essential (primary) hypertension: Secondary | ICD-10-CM

## 2021-06-02 ENCOUNTER — Telehealth: Payer: Self-pay | Admitting: Family Medicine

## 2021-06-02 NOTE — Telephone Encounter (Signed)
°  Left message for patient to call back and schedule Medicare Annual Wellness Visit (AWV) to be completed by video or phone.  No hx of AWV eligible for AWVI as of 05/18/2009 per [facility]  Please schedule at anytime with [place] --- Karle Starch  45 Minutes appointment   Any questions, please call me at [phone]

## 2021-06-06 ENCOUNTER — Other Ambulatory Visit: Payer: Self-pay | Admitting: Family Medicine

## 2021-06-06 DIAGNOSIS — E119 Type 2 diabetes mellitus without complications: Secondary | ICD-10-CM

## 2021-06-10 ENCOUNTER — Encounter: Payer: Self-pay | Admitting: Family Medicine

## 2021-06-10 ENCOUNTER — Ambulatory Visit (INDEPENDENT_AMBULATORY_CARE_PROVIDER_SITE_OTHER): Payer: Medicare Other | Admitting: Family Medicine

## 2021-06-10 VITALS — BP 135/65 | HR 66 | Temp 97.1°F | Wt 115.2 lb

## 2021-06-10 DIAGNOSIS — E038 Other specified hypothyroidism: Secondary | ICD-10-CM | POA: Diagnosis not present

## 2021-06-10 NOTE — Progress Notes (Signed)
Chief Complaint  Patient presents with   Follow-up    HPI  Patient presents today for recheck of thyroid. Back on her med. Doesn't know why she wasn't taking it. Energy is better.  PMH: Smoking status noted ROS: Per HPI  Objective: BP 135/65    Pulse 66    Temp (!) 97.1 F (36.2 C)    Wt 115 lb 3.2 oz (52.3 kg)    SpO2 98%    BMI 21.07 kg/m  Gen: NAD, alert, cooperative with exam HEENT: NCAT, EOMI, PERRL CV: RRR, good S1/S2, no murmur Resp: CTABL, no wheezes, non-labored Abd: SNTND, BS present, no guarding or organomegaly Ext: No edema, warm Neuro: Alert and oriented, No gross deficits  Assessment and plan:  1. Other specified hypothyroidism       Orders Placed This Encounter  Procedures   TSH + free T4    Follow up as needed.  Claretta Fraise, MD

## 2021-06-11 ENCOUNTER — Other Ambulatory Visit: Payer: Self-pay | Admitting: Family Medicine

## 2021-06-11 DIAGNOSIS — I10 Essential (primary) hypertension: Secondary | ICD-10-CM

## 2021-06-11 DIAGNOSIS — E119 Type 2 diabetes mellitus without complications: Secondary | ICD-10-CM

## 2021-06-11 LAB — TSH+FREE T4
Free T4: 1.82 ng/dL — ABNORMAL HIGH (ref 0.82–1.77)
TSH: 0.298 u[IU]/mL — ABNORMAL LOW (ref 0.450–4.500)

## 2021-06-12 ENCOUNTER — Other Ambulatory Visit: Payer: Self-pay | Admitting: Family Medicine

## 2021-06-12 DIAGNOSIS — E119 Type 2 diabetes mellitus without complications: Secondary | ICD-10-CM

## 2021-06-12 NOTE — Telephone Encounter (Signed)
Olmesartan RF based off all others done 04/29/21 by Dr. Livia Snellen

## 2021-06-24 ENCOUNTER — Telehealth: Payer: Self-pay | Admitting: Family Medicine

## 2021-06-24 NOTE — Telephone Encounter (Signed)
°  Left message for patient to call back and schedule Medicare Annual Wellness Visit (AWV) to be completed by video or phone.  No hx of AWV eligible for AWVI as of 05/18/2009 per [employer]   Please schedule at anytime with [place] --- Karle Starch  45 Minutes appointment   Any questions, please call me at [phone]

## 2021-07-30 DIAGNOSIS — E119 Type 2 diabetes mellitus without complications: Secondary | ICD-10-CM | POA: Diagnosis not present

## 2021-07-30 DIAGNOSIS — Z961 Presence of intraocular lens: Secondary | ICD-10-CM | POA: Diagnosis not present

## 2021-07-30 DIAGNOSIS — H353132 Nonexudative age-related macular degeneration, bilateral, intermediate dry stage: Secondary | ICD-10-CM | POA: Diagnosis not present

## 2021-07-30 LAB — HM DIABETES EYE EXAM

## 2021-08-11 ENCOUNTER — Encounter: Payer: Self-pay | Admitting: Family Medicine

## 2021-08-11 ENCOUNTER — Ambulatory Visit (INDEPENDENT_AMBULATORY_CARE_PROVIDER_SITE_OTHER): Payer: Medicare Other | Admitting: Family Medicine

## 2021-08-11 VITALS — BP 127/63 | HR 61 | Temp 97.5°F | Ht 62.0 in | Wt 119.0 lb

## 2021-08-11 DIAGNOSIS — H02539 Eyelid retraction unspecified eye, unspecified lid: Secondary | ICD-10-CM

## 2021-08-11 DIAGNOSIS — E119 Type 2 diabetes mellitus without complications: Secondary | ICD-10-CM

## 2021-08-11 DIAGNOSIS — I10 Essential (primary) hypertension: Secondary | ICD-10-CM

## 2021-08-11 DIAGNOSIS — E039 Hypothyroidism, unspecified: Secondary | ICD-10-CM | POA: Diagnosis not present

## 2021-08-11 DIAGNOSIS — E782 Mixed hyperlipidemia: Secondary | ICD-10-CM | POA: Diagnosis not present

## 2021-08-11 LAB — BAYER DCA HB A1C WAIVED: HB A1C (BAYER DCA - WAIVED): 5.8 % — ABNORMAL HIGH (ref 4.8–5.6)

## 2021-08-11 NOTE — Progress Notes (Signed)
? ?Subjective:  ?Patient ID: Sophia [name], female    DOB: 10/23/1931  Age: 88 y.o. MRN: [id number] ? ?CC: Medical Management of Chronic Issues ? ? ?HPI ?[name] presents forFollow-up of diabetes. Patient not checking blood sugar at home.  ?  ?Patient denies symptoms such as polyuria, polydipsia, excessive hunger, nausea ?No significant hypoglycemic spells noted. ?Medications reviewed. Pt reports taking them regularly without complication/adverse reaction being reported today.  ?Checking feet daily. ?Last eye appt was recent. Dr. Carolynn Sayers evaluated lid lag on right. He gaave her some samples of a drop that would help. Considering a procedure. ? ?History ?Sophia [name] has a past medical history of Cancer ([place]), Chronic kidney disease, Diabetes mellitus without complication ([place]), GERD (gastroesophageal reflux disease), Hyperlipidemia, Hypertension, and Hypothyroidism.  ? ?She has a past surgical history that includes Cholecystectomy; Sigmoidoscopy; Esophagogastroduodenoscopy (N/A, 04/29/2015); Eye surgery (Right, 01/19/2019); Colonoscopy; Excision basal cell carcinoma (N/A, 05/21/2020); and Adjacent tissue transfer/tissue rearrangement (N/A, 05/21/2020).  ? ?Her family history includes Colon cancer in her sister; Heart disease in her brother, father, and sister.She reports that she has never smoked. She has never used smokeless tobacco. She reports that she does not drink alcohol and does not use drugs. ? ?Current Outpatient Medications on File Prior to Visit  ?Medication Sig Dispense Refill  ? alendronate (FOSAMAX) 70 MG tablet Take 1 tablet (70 mg total) by mouth every 7 (seven) days. Take with a full glass of water on an empty stomach. (Patient taking differently: Take 70 mg by mouth every Sunday. Take with a full glass of water on an empty stomach.) 4 tablet 5  ? amLODipine (NORVASC) 5 MG tablet Take 1 tablet (5 mg total) by mouth daily. 90 tablet 3  ? blood glucose meter kit and supplies KIT Use up to 4x  daily prn 1 each 0  ? Calcium-Magnesium 500-250 MG TABS Take 1 tablet by mouth daily with supper. 30 each 5  ? carvedilol (COREG) 25 MG tablet TAKE 1 TABLET BY MOUTH TWICE DAILY WITH MEALS . 180 tablet 3  ? Cholecalciferol (DIALYVITE VITAMIN D 5000) 125 MCG (5000 UT) capsule Take 5,000 Units by mouth daily.    ? ferrous sulfate 325 (65 FE) MG tablet Take 1 tablet (325 mg total) by mouth daily with breakfast.    ? ibuprofen (ADVIL) 200 MG tablet Take 400 mg by mouth every 6 (six) hours as needed for headache or moderate pain.    ? levothyroxine (EUTHYROX) 75 MCG tablet TAKE 1 TABLET BY MOUTH ONCE DAILY BEFORE BREAKFAST . 90 tablet 3  ? Magnesium 250 MG TABS Take 250 mg by mouth daily.    ? metFORMIN (GLUCOPHAGE-XR) 500 MG 24 hr tablet Take 1 tablet (500 mg total) by mouth daily with breakfast. 90 tablet 3  ? olmesartan (BENICAR) 40 MG tablet Take 1 tablet (40 mg total) by mouth daily. 90 tablet 3  ? omeprazole (PRILOSEC) 40 MG capsule TAKE 1 CAPSULE ON AN EMPTY STOMACH 1 HOUR BEFORE BREAKFAST AND ANOTHER AT BEDTIME (2 HOURS AFTER YOUR LAST FOOD AND DRINK EXCEPT WATER) 180 capsule 3  ? ondansetron (ZOFRAN) 4 MG tablet Take 1 tablet (4 mg total) by mouth every 8 (eight) hours as needed for nausea or vomiting. 20 tablet 0  ? simvastatin (ZOCOR) 40 MG tablet TAKE 1/2 (ONE-HALF) TABLET BY MOUTH ONCE DAILY AT  6PM 90 tablet 3  ? solifenacin (VESICARE) 10 MG tablet Take 1 tablet (10 mg total) by mouth daily. For urinary control 90 tablet 3  ?  vitamin B-12 (CYANOCOBALAMIN) 1000 MCG tablet Take 1,000 mcg by mouth daily.    ? ?Current Facility-Administered Medications on File Prior to Visit  ?Medication Dose Route Frequency Provider Last Rate Last Admin  ? methylPREDNISolone acetate (DEPO-MEDROL) injection 40 mg  40 mg Intramuscular Once Loman Brooklyn, FNP      ? ? ?ROS ?Review of Systems  ?Constitutional: Negative.   ?HENT: Negative.    ?Eyes:  Positive for visual disturbance (right lid lag).  ?Respiratory:  Negative for  shortness of breath.   ?Cardiovascular:  Negative for chest pain.  ?Gastrointestinal:  Negative for abdominal pain.  ?Musculoskeletal:  Negative for arthralgias.  ? ?Objective:  ?BP 127/63   Pulse 61   Temp (!) 97.5 ?F (36.4 ?C)   Ht _0  (1.575 m)   Wt 119 lb (54 kg)   SpO2 98%   BMI 21.77 kg/m?  ? ?BP Readings from Last 3 Encounters:  ?08/11/21 127/63  ?06/10/21 135/65  ?04/29/21 109/65  ? ? ?Wt Readings from Last 3 Encounters:  ?08/11/21 119 lb (54 kg)  ?06/10/21 115 lb 3.2 oz (52.3 kg)  ?04/29/21 116 lb 12.8 oz (53 kg)  ? ? ? ?Physical Exam ?Constitutional:   ?   General: She is not in acute distress. ?   Appearance: She is well-developed.  ?HENT:  ?   Head: Normocephalic and atraumatic.  ?Eyes:  ?   Conjunctiva/sclera: Conjunctivae normal.  ?   Pupils: Pupils are equal, round, and reactive to light.  ?Neck:  ?   Thyroid: No thyromegaly.  ?Cardiovascular:  ?   Rate and Rhythm: Normal rate and regular rhythm.  ?   Heart sounds: Normal heart sounds. No murmur heard. ?Pulmonary:  ?   Effort: Pulmonary effort is normal. No respiratory distress.  ?   Breath sounds: Normal breath sounds. No wheezing or rales.  ?Abdominal:  ?   General: Bowel sounds are normal. There is no distension.  ?   Palpations: Abdomen is soft.  ?   Tenderness: There is no abdominal tenderness.  ?Musculoskeletal:     ?   General: Normal range of motion.  ?   Cervical back: Normal range of motion and neck supple.  ?Lymphadenopathy:  ?   Cervical: No cervical adenopathy.  ?Skin: ?   General: Skin is warm and dry.  ?Neurological:  ?   Mental Status: She is alert and oriented to person, place, and time.  ?Psychiatric:     ?   Behavior: Behavior normal.     ?   Thought Content: Thought content normal.     ?   Judgment: Judgment normal.  ? ? ? ? ?Assessment & Plan:  ? ?Dreyah was seen today for medical management of chronic issues. ? ?Diagnoses and all orders for this visit: ? ?Diabetes mellitus without complication ([place]) ?-     Bayer DCA Hb  A1c Waived ? ?Mixed hyperlipidemia ?-     Lipid panel ? ?Hypothyroidism, unspecified type ?-     TSH + free T4 ? ?Essential (primary) hypertension ?-     CBC with Differential/Platelet ?-     CMP14+EGFR ? ?Lid lag ? ? ? ? ? ?I am having [name] maintain her Calcium-Magnesium, blood glucose meter kit and supplies, ferrous sulfate, alendronate, ondansetron, vitamin B-12, Magnesium, Dialyvite Vitamin D 5000, ibuprofen, amLODipine, carvedilol, levothyroxine, metFORMIN, omeprazole, simvastatin, solifenacin, and olmesartan. We will continue to administer methylPREDNISolone acetate. ? ?No orders of the defined types were placed in this encounter. ? ? ? ?  Follow-up: Return in about 4 months (around 12/11/2021). ? ?Claretta Fraise, M.D. ?

## 2021-08-12 LAB — CMP14+EGFR
ALT: 10 IU/L (ref 0–32)
AST: 16 IU/L (ref 0–40)
Albumin/Globulin Ratio: 1.7 (ref 1.2–2.2)
Albumin: 4 g/dL (ref 3.6–4.6)
Alkaline Phosphatase: 66 IU/L (ref 44–121)
BUN/Creatinine Ratio: 13 (ref 12–28)
BUN: 14 mg/dL (ref 8–27)
Bilirubin Total: 0.3 mg/dL (ref 0.0–1.2)
CO2: 24 mmol/L (ref 20–29)
Calcium: 9.3 mg/dL (ref 8.7–10.3)
Chloride: 109 mmol/L — ABNORMAL HIGH (ref 96–106)
Creatinine, Ser: 1.06 mg/dL — ABNORMAL HIGH (ref 0.57–1.00)
Globulin, Total: 2.3 g/dL (ref 1.5–4.5)
Glucose: 132 mg/dL — ABNORMAL HIGH (ref 70–99)
Potassium: 4.8 mmol/L (ref 3.5–5.2)
Sodium: 147 mmol/L — ABNORMAL HIGH (ref 134–144)
Total Protein: 6.3 g/dL (ref 6.0–8.5)
eGFR: 51 mL/min/{1.73_m2} — ABNORMAL LOW (ref >59)

## 2021-08-12 LAB — CBC WITH DIFFERENTIAL/PLATELET
Basophils Absolute: 0 10*3/uL (ref 0.0–0.2)
Basos: 1 %
EOS (ABSOLUTE): 0.3 10*3/uL (ref 0.0–0.4)
Eos: 5 %
Hematocrit: 34.7 % (ref 34.0–46.6)
Hemoglobin: 11.3 g/dL (ref 11.1–15.9)
Immature Grans (Abs): 0 10*3/uL (ref 0.0–0.1)
Immature Granulocytes: 0 %
Lymphocytes Absolute: 1.5 10*3/uL (ref 0.7–3.1)
Lymphs: 25 %
MCH: 31.7 pg (ref 26.6–33.0)
MCHC: 32.6 g/dL (ref 31.5–35.7)
MCV: 98 fL — ABNORMAL HIGH (ref 79–97)
Monocytes Absolute: 0.5 10*3/uL (ref 0.1–0.9)
Monocytes: 8 %
Neutrophils Absolute: 3.8 10*3/uL (ref 1.4–7.0)
Neutrophils: 61 %
Platelets: 269 10*3/uL (ref 150–450)
RBC: 3.56 x10E6/uL — ABNORMAL LOW (ref 3.77–5.28)
RDW: 12 % (ref 11.7–15.4)
WBC: 6.1 10*3/uL (ref 3.4–10.8)

## 2021-08-12 LAB — TSH+FREE T4
Free T4: 1.58 ng/dL (ref 0.82–1.77)
TSH: 0.318 u[IU]/mL — ABNORMAL LOW (ref 0.450–4.500)

## 2021-08-12 LAB — LIPID PANEL
Chol/HDL Ratio: 2.5 ratio (ref 0.0–4.4)
Cholesterol, Total: 169 mg/dL (ref 100–199)
HDL: 68 mg/dL (ref >39)
LDL Chol Calc (NIH): 82 mg/dL (ref 0–99)
Triglycerides: 107 mg/dL (ref 0–149)
VLDL Cholesterol Cal: 19 mg/dL (ref 5–40)

## 2021-08-13 NOTE — Progress Notes (Signed)
Hello Chandi,  Your lab result is normal and/or stable.Some minor variations that are not significant are commonly marked abnormal, but do not represent any medical problem for you.  Best regards, Digby Groeneveld, M.D.

## 2021-09-16 ENCOUNTER — Telehealth: Payer: Self-pay | Admitting: Family Medicine

## 2021-09-16 NOTE — Telephone Encounter (Signed)
?  Left message for patient to call back and schedule Medicare Annual Wellness Visit (AWV) to be completed by video or phone.  No hx of AWV eligible for AWVI per [facility] as of   05/18/2009  Please schedule at anytime with [facility] Nurse Health Advisor   45  Minutes appointment   Any questions, please call me at [phone]  

## 2021-11-26 ENCOUNTER — Ambulatory Visit: Payer: Self-pay | Admitting: *Deleted

## 2021-11-26 NOTE — Patient Instructions (Signed)
[name]  I have enjoyed working with you through the Chronic Care Management Program at [facility]. Due to program changes I am removing myself from your care team because you've either met our goals, your conditions are stable and no longer require care management, or we haven't engaged within the past 6 months. If you are currently active with another CCM Team Member, you will remain active with them unless they reach out to you with additional information. If you feel that you need RN Care Management services in the future, please talk with your primary care provider to discuss re-engagement with the RN Care Manager that will be assigned to [facility]. This does not affect your status as a patient at [facility].   Thank you for allowing me to participate in your your healthcare journey.  Chong Sicilian, BSN, RN-BC Embedded Chronic Care Manager [facility] Management Direct Dial: [phone]

## 2021-11-26 NOTE — Chronic Care Management (AMB) (Signed)
  Chronic Care Management   Note  11/26/2021 Name: Sophia [name] MRN: [id number] DOB: 1931-08-19   Patient is stable from RN Care Management perspective or has not recently engaged with the [facility]. I am removing RN Care Manager from Care Team and closing [place]. If patient is currently engaged with another CCM team member I will forward this encounter to inform them of my case closure. Patient may be eligible for re-engagement with RN Care Manager in the future if necessary and can discuss this with their PCP.  Chong Sicilian, BSN, RN-BC Embedded Chronic Care Manager [facility] Management Direct Dial: [phone]

## 2021-12-11 ENCOUNTER — Ambulatory Visit (INDEPENDENT_AMBULATORY_CARE_PROVIDER_SITE_OTHER): Payer: Medicare Other | Admitting: Family Medicine

## 2021-12-11 ENCOUNTER — Encounter: Payer: Self-pay | Admitting: Family Medicine

## 2021-12-11 VITALS — BP 141/61 | HR 58 | Temp 97.2°F | Ht 62.0 in | Wt 122.4 lb

## 2021-12-11 DIAGNOSIS — I10 Essential (primary) hypertension: Secondary | ICD-10-CM | POA: Diagnosis not present

## 2021-12-11 DIAGNOSIS — E039 Hypothyroidism, unspecified: Secondary | ICD-10-CM

## 2021-12-11 DIAGNOSIS — E119 Type 2 diabetes mellitus without complications: Secondary | ICD-10-CM | POA: Diagnosis not present

## 2021-12-11 DIAGNOSIS — E782 Mixed hyperlipidemia: Secondary | ICD-10-CM

## 2021-12-11 LAB — BAYER DCA HB A1C WAIVED: HB A1C (BAYER DCA - WAIVED): 6 % — ABNORMAL HIGH (ref 4.8–5.6)

## 2021-12-11 NOTE — Progress Notes (Signed)
Subjective:  Patient ID: Sophia [name],  female    DOB: April 04, 1932  Age: 88 y.o.    CC: Medical Management of Chronic Issues   HPI Sophia [name] presents for  follow-up of hypertension. Patient has no history of headache chest pain or shortness of breath or recent cough. Patient also denies symptoms of TIA such as numbness weakness lateralizing. Patient denies side effects from medication. States taking it regularly.  Patient also  in for follow-up of elevated cholesterol. Doing well without complaints on current medication. Denies side effects  including myalgia and arthralgia and nausea. Also in today for liver function testing. Currently no chest pain, shortness of breath or other cardiovascular related symptoms noted.  Follow-up of diabetes. Patient does not check blood sugar at home. Patient denies symptoms such as excessive hunger or urinary frequency, excessive hunger, nausea No significant hypoglycemic spells noted. Medications reviewed. Pt reports taking them regularly. Pt. denies complication/adverse reaction today.    History Sophia [name] has a past medical history of Cancer ([place]), Chronic kidney disease, Diabetes mellitus without complication ([place]), GERD (gastroesophageal reflux disease), Hyperlipidemia, Hypertension, and Hypothyroidism.   She has a past surgical history that includes Cholecystectomy; Sigmoidoscopy; Esophagogastroduodenoscopy (N/A, 04/29/2015); Eye surgery (Right, 01/19/2019); Colonoscopy; Excision basal cell carcinoma (N/A, 05/21/2020); and Adjacent tissue transfer/tissue rearrangement (N/A, 05/21/2020).   Her family history includes Colon cancer in her sister; Heart disease in her brother, father, and sister.She reports that she has never smoked. She has never used smokeless tobacco. She reports that she does not drink alcohol and does not use drugs.  Current Outpatient Medications on File Prior to Visit  Medication Sig Dispense Refill   alendronate  (FOSAMAX) 70 MG tablet Take 1 tablet (70 mg total) by mouth every 7 (seven) days. Take with a full glass of water on an empty stomach. (Patient taking differently: Take 70 mg by mouth every Sunday. Take with a full glass of water on an empty stomach.) 4 tablet 5   amLODipine (NORVASC) 5 MG tablet Take 1 tablet (5 mg total) by mouth daily. 90 tablet 3   blood glucose meter kit and supplies KIT Use up to 4x daily prn 1 each 0   Calcium-Magnesium 500-250 MG TABS Take 1 tablet by mouth daily with supper. 30 each 5   carvedilol (COREG) 25 MG tablet TAKE 1 TABLET BY MOUTH TWICE DAILY WITH MEALS . 180 tablet 3   Cholecalciferol (DIALYVITE VITAMIN D 5000) 125 MCG (5000 UT) capsule Take 5,000 Units by mouth daily.     ferrous sulfate 325 (65 FE) MG tablet Take 1 tablet (325 mg total) by mouth daily with breakfast.     ibuprofen (ADVIL) 200 MG tablet Take 400 mg by mouth every 6 (six) hours as needed for headache or moderate pain.     levothyroxine (EUTHYROX) 75 MCG tablet TAKE 1 TABLET BY MOUTH ONCE DAILY BEFORE BREAKFAST . 90 tablet 3   Magnesium 250 MG TABS Take 250 mg by mouth daily.     metFORMIN (GLUCOPHAGE-XR) 500 MG 24 hr tablet Take 1 tablet (500 mg total) by mouth daily with breakfast. 90 tablet 3   olmesartan (BENICAR) 40 MG tablet Take 1 tablet (40 mg total) by mouth daily. 90 tablet 3   omeprazole (PRILOSEC) 40 MG capsule TAKE 1 CAPSULE ON AN EMPTY STOMACH 1 HOUR BEFORE BREAKFAST AND ANOTHER AT BEDTIME (2 HOURS AFTER YOUR LAST FOOD AND DRINK EXCEPT WATER) 180 capsule 3   ondansetron (ZOFRAN) 4 MG tablet Take 1 tablet (  4 mg total) by mouth every 8 (eight) hours as needed for nausea or vomiting. 20 tablet 0   simvastatin (ZOCOR) 40 MG tablet TAKE 1/2 (ONE-HALF) TABLET BY MOUTH ONCE DAILY AT  6PM 90 tablet 3   solifenacin (VESICARE) 10 MG tablet Take 1 tablet (10 mg total) by mouth daily. For urinary control 90 tablet 3   vitamin B-12 (CYANOCOBALAMIN) 1000 MCG tablet Take 1,000 mcg by mouth daily.      Current Facility-Administered Medications on File Prior to Visit  Medication Dose Route Frequency Provider Last Rate Last Admin   methylPREDNISolone acetate (DEPO-MEDROL) injection 40 mg  40 mg Intramuscular Once Loman Brooklyn, FNP        ROS Review of Systems  Constitutional: Negative.   HENT: Negative.    Eyes:  Negative for visual disturbance.  Respiratory:  Negative for shortness of breath.   Cardiovascular:  Negative for chest pain.  Gastrointestinal:  Negative for abdominal pain.  Musculoskeletal:  Negative for arthralgias.    Objective:  BP (!) 141/61   Pulse (!) 58   Temp (!) 97.2 F (36.2 C)   Ht _0  (1.575 m)   Wt 122 lb 6.4 oz (55.5 kg)   SpO2 97%   BMI 22.39 kg/m   BP Readings from Last 3 Encounters:  12/11/21 (!) 141/61  08/11/21 127/63  06/10/21 135/65    Wt Readings from Last 3 Encounters:  12/11/21 122 lb 6.4 oz (55.5 kg)  08/11/21 119 lb (54 kg)  06/10/21 115 lb 3.2 oz (52.3 kg)     Physical Exam Constitutional:      General: She is not in acute distress.    Appearance: She is well-developed.  Cardiovascular:     Rate and Rhythm: Normal rate and regular rhythm.  Pulmonary:     Breath sounds: Normal breath sounds.  Musculoskeletal:        General: Normal range of motion.  Skin:    General: Skin is warm and dry.  Neurological:     Mental Status: She is alert and oriented to person, place, and time.     Diabetic Foot Exam - Simple   No data filed     Lab Results  Component Value Date   HGBA1C 5.8 (H) 08/11/2021   HGBA1C 6.1 (H) 04/29/2021   HGBA1C 6.3 09/23/2020    Assessment & Plan:   Sophia [name] was seen today for medical management of chronic issues.  Diagnoses and all orders for this visit:  Diabetes mellitus without complication ([place]) -     Bayer DCA Hb A1c Waived  Mixed hyperlipidemia -     Lipid panel  Hypothyroidism, unspecified type -     TSH + free T4  Essential (primary) hypertension -     CBC with  Differential/Platelet -     CMP14+EGFR   I am having Sophia [name] maintain her Calcium-Magnesium, blood glucose meter kit and supplies, ferrous sulfate, alendronate, ondansetron, cyanocobalamin, Magnesium, Dialyvite Vitamin D 5000, ibuprofen, amLODipine, carvedilol, levothyroxine, metFORMIN, omeprazole, simvastatin, solifenacin, and olmesartan. We will continue to administer methylPREDNISolone acetate.  No orders of the defined types were placed in this encounter.    Follow-up: No follow-ups on file.  Claretta Fraise, M.D.

## 2021-12-12 LAB — CBC WITH DIFFERENTIAL/PLATELET
Basophils Absolute: 0.1 10*3/uL (ref 0.0–0.2)
Basos: 1 %
EOS (ABSOLUTE): 0.3 10*3/uL (ref 0.0–0.4)
Eos: 4 %
Hematocrit: 33.9 % — ABNORMAL LOW (ref 34.0–46.6)
Hemoglobin: 11.2 g/dL (ref 11.1–15.9)
Immature Grans (Abs): 0 10*3/uL (ref 0.0–0.1)
Immature Granulocytes: 0 %
Lymphocytes Absolute: 1.7 10*3/uL (ref 0.7–3.1)
Lymphs: 22 %
MCH: 32.5 pg (ref 26.6–33.0)
MCHC: 33 g/dL (ref 31.5–35.7)
MCV: 98 fL — ABNORMAL HIGH (ref 79–97)
Monocytes Absolute: 0.5 10*3/uL (ref 0.1–0.9)
Monocytes: 6 %
Neutrophils Absolute: 5 10*3/uL (ref 1.4–7.0)
Neutrophils: 67 %
Platelets: 292 10*3/uL (ref 150–450)
RBC: 3.45 x10E6/uL — ABNORMAL LOW (ref 3.77–5.28)
RDW: 12.8 % (ref 11.7–15.4)
WBC: 7.5 10*3/uL (ref 3.4–10.8)

## 2021-12-12 LAB — CMP14+EGFR
ALT: 9 IU/L (ref 0–32)
AST: 18 IU/L (ref 0–40)
Albumin/Globulin Ratio: 1.6 (ref 1.2–2.2)
Albumin: 3.9 g/dL (ref 3.7–4.7)
Alkaline Phosphatase: 58 IU/L (ref 44–121)
BUN/Creatinine Ratio: 19 (ref 12–28)
BUN: 25 mg/dL (ref 8–27)
Bilirubin Total: 0.3 mg/dL (ref 0.0–1.2)
CO2: 22 mmol/L (ref 20–29)
Calcium: 9.5 mg/dL (ref 8.7–10.3)
Chloride: 105 mmol/L (ref 96–106)
Creatinine, Ser: 1.3 mg/dL — ABNORMAL HIGH (ref 0.57–1.00)
Globulin, Total: 2.5 g/dL (ref 1.5–4.5)
Glucose: 147 mg/dL — ABNORMAL HIGH (ref 70–99)
Potassium: 4.7 mmol/L (ref 3.5–5.2)
Sodium: 141 mmol/L (ref 134–144)
Total Protein: 6.4 g/dL (ref 6.0–8.5)
eGFR: 40 mL/min/{1.73_m2} — ABNORMAL LOW (ref >59)

## 2021-12-12 LAB — LIPID PANEL
Chol/HDL Ratio: 2.7 ratio (ref 0.0–4.4)
Cholesterol, Total: 184 mg/dL (ref 100–199)
HDL: 67 mg/dL (ref >39)
LDL Chol Calc (NIH): 79 mg/dL (ref 0–99)
Triglycerides: 232 mg/dL — ABNORMAL HIGH (ref 0–149)
VLDL Cholesterol Cal: 38 mg/dL (ref 5–40)

## 2021-12-12 LAB — TSH+FREE T4
Free T4: 1.25 ng/dL (ref 0.82–1.77)
TSH: 9.35 u[IU]/mL — ABNORMAL HIGH (ref 0.450–4.500)

## 2022-02-28 DIAGNOSIS — Z23 Encounter for immunization: Secondary | ICD-10-CM | POA: Diagnosis not present

## 2022-04-06 ENCOUNTER — Ambulatory Visit (INDEPENDENT_AMBULATORY_CARE_PROVIDER_SITE_OTHER): Payer: Medicare Other | Admitting: Family Medicine

## 2022-04-06 ENCOUNTER — Encounter: Payer: Self-pay | Admitting: Family Medicine

## 2022-04-06 DIAGNOSIS — J40 Bronchitis, not specified as acute or chronic: Secondary | ICD-10-CM

## 2022-04-06 DIAGNOSIS — J329 Chronic sinusitis, unspecified: Secondary | ICD-10-CM | POA: Diagnosis not present

## 2022-04-06 MED ORDER — CEFUROXIME AXETIL 250 MG PO TABS: 250.0000 mg | ORAL_TABLET | Freq: Two times a day (BID) | ORAL | 0 refills | Status: AC

## 2022-04-06 MED ORDER — BENZONATATE 200 MG PO CAPS: 200.0000 mg | ORAL_CAPSULE | Freq: Three times a day (TID) | ORAL | 0 refills | Status: DC | PRN

## 2022-04-06 NOTE — Progress Notes (Signed)
Subjective:    Patient ID: Sophia [name], female    DOB: [birth year], 88 y.o.   MRN: [id number]   HPI: Sophia [name] is a 88 y.o. female presenting for bad cough. Onset 3 weeks ago. Won't go away. Feeling yucky, malaise. Not achy. No fever or chills. No dyspnea. Cough producing scant but purulent sputum.   Had covid booster three weeks ago.       12/11/2021    2:01 PM 08/11/2021    1:00 PM 06/10/2021    2:58 PM 04/29/2021    3:24 PM 04/29/2021    3:06 PM  Depression screen PHQ 2/9  Decreased Interest 0 0 0 1 0  Down, Depressed, Hopeless 0 0 0 0 0  PHQ - 2 Score 0 0 0 1 0  Altered sleeping    0   Tired, decreased energy    1   Change in appetite    1   Feeling bad or failure about yourself     0   Trouble concentrating    0   Moving slowly or fidgety/restless    0   Suicidal thoughts    0   PHQ-9 Score    3   Difficult doing work/chores    Not difficult at all      Relevant past medical, surgical, family and social history reviewed and updated as indicated.  Interim medical history since our last visit reviewed. Allergies and medications reviewed and updated.  ROS:  Review of Systems  Constitutional:  Negative for appetite change, chills, diaphoresis, fatigue and fever.  HENT:  Positive for congestion. Negative for ear pain, postnasal drip, rhinorrhea, sore throat and trouble swallowing.   Respiratory:  Positive for cough. Negative for chest tightness and shortness of breath.   Cardiovascular:  Negative for chest pain.  Gastrointestinal:  Negative for abdominal pain.  Musculoskeletal:  Negative for arthralgias.  Skin:  Negative for rash.     Social History   Tobacco Use  Smoking Status Never  Smokeless Tobacco Never       Objective:     Wt Readings from Last 3 Encounters:  12/11/21 122 lb 6.4 oz (55.5 kg)  08/11/21 119 lb (54 kg)  06/10/21 115 lb 3.2 oz (52.3 kg)     Exam deferred. Pt. Harbor[place] due to COVID 19. Phone visit performed.    Assessment & Plan:   1. Sinobronchitis     Meds ordered this encounter  Medications   benzonatate (TESSALON) 200 MG capsule    Sig: Take 1 capsule (200 mg total) by mouth 3 (three) times daily as needed for cough.    Dispense:  20 capsule    Refill:  0   cefUROXime (CEFTIN) 250 MG tablet    Sig: Take 1 tablet (250 mg total) by mouth 2 (two) times daily with a meal for 10 days.    Dispense:  20 tablet    Refill:  0    No orders of the defined types were placed in this encounter.     Diagnoses and all orders for this visit:  Sinobronchitis  Other orders -     benzonatate (TESSALON) 200 MG capsule; Take 1 capsule (200 mg total) by mouth 3 (three) times daily as needed for cough. -     cefUROXime (CEFTIN) 250 MG tablet; Take 1 tablet (250 mg total) by mouth 2 (two) times daily with a meal for 10 days.    Virtual Visit via telephone Note  I discussed  the limitations, risks, security and privacy concerns of performing an evaluation and management service by telephone and the availability of in person appointments. The patient was identified with two identifiers. Pt.expressed understanding and agreed to proceed. Pt. Is at home. Dr. Livia Snellen is in his office.  Follow Up Instructions:   I discussed the assessment and treatment plan with the patient. The patient was provided an opportunity to ask questions and all were answered. The patient agreed with the plan and demonstrated an understanding of the instructions.   The patient was advised to call back or seek an in-person evaluation if the symptoms worsen or if the condition fails to improve as anticipated.   Total minutes including chart review and phone contact time: 7   Follow up plan: Return if symptoms worsen or fail to improve.  Claretta Fraise, MD [place]

## 2022-05-21 ENCOUNTER — Other Ambulatory Visit: Payer: Self-pay | Admitting: Family Medicine

## 2022-05-21 ENCOUNTER — Encounter: Payer: Self-pay | Admitting: Family Medicine

## 2022-05-21 ENCOUNTER — Ambulatory Visit (INDEPENDENT_AMBULATORY_CARE_PROVIDER_SITE_OTHER): Payer: Medicare Other

## 2022-05-21 ENCOUNTER — Other Ambulatory Visit: Payer: Medicare Other

## 2022-05-21 ENCOUNTER — Ambulatory Visit (INDEPENDENT_AMBULATORY_CARE_PROVIDER_SITE_OTHER): Payer: Medicare Other | Admitting: Family Medicine

## 2022-05-21 VITALS — BP 142/60 | HR 60 | Temp 97.0°F | Ht 62.0 in | Wt 115.6 lb

## 2022-05-21 DIAGNOSIS — I10 Essential (primary) hypertension: Secondary | ICD-10-CM | POA: Diagnosis not present

## 2022-05-21 DIAGNOSIS — E039 Hypothyroidism, unspecified: Secondary | ICD-10-CM

## 2022-05-21 DIAGNOSIS — M545 Low back pain, unspecified: Secondary | ICD-10-CM

## 2022-05-21 DIAGNOSIS — M810 Age-related osteoporosis without current pathological fracture: Secondary | ICD-10-CM

## 2022-05-21 DIAGNOSIS — E782 Mixed hyperlipidemia: Secondary | ICD-10-CM

## 2022-05-21 DIAGNOSIS — E119 Type 2 diabetes mellitus without complications: Secondary | ICD-10-CM | POA: Diagnosis not present

## 2022-05-21 DIAGNOSIS — Z780 Asymptomatic menopausal state: Secondary | ICD-10-CM

## 2022-05-21 DIAGNOSIS — K219 Gastro-esophageal reflux disease without esophagitis: Secondary | ICD-10-CM

## 2022-05-21 DIAGNOSIS — R350 Frequency of micturition: Secondary | ICD-10-CM

## 2022-05-21 DIAGNOSIS — E038 Other specified hypothyroidism: Secondary | ICD-10-CM

## 2022-05-21 LAB — BAYER DCA HB A1C WAIVED: HB A1C (BAYER DCA - WAIVED): 6.7 % — ABNORMAL HIGH (ref 4.8–5.6)

## 2022-05-21 MED ORDER — METFORMIN HCL ER 500 MG PO TB24: 500.0000 mg | ORAL_TABLET | Freq: Every day | ORAL | 3 refills | Status: DC

## 2022-05-21 MED ORDER — LEVOTHYROXINE SODIUM 75 MCG PO TABS: ORAL_TABLET | ORAL | 3 refills | Status: DC

## 2022-05-21 MED ORDER — OLMESARTAN MEDOXOMIL 40 MG PO TABS: 40.0000 mg | ORAL_TABLET | Freq: Every day | ORAL | 3 refills | Status: DC

## 2022-05-21 MED ORDER — CARVEDILOL 25 MG PO TABS: ORAL_TABLET | ORAL | 3 refills | Status: DC

## 2022-05-21 MED ORDER — SOLIFENACIN SUCCINATE 10 MG PO TABS: 10.0000 mg | ORAL_TABLET | Freq: Every day | ORAL | 3 refills | Status: DC

## 2022-05-21 MED ORDER — OMEPRAZOLE 40 MG PO CPDR: DELAYED_RELEASE_CAPSULE | ORAL | 3 refills | Status: DC

## 2022-05-21 MED ORDER — AMLODIPINE BESYLATE 5 MG PO TABS: 5.0000 mg | ORAL_TABLET | Freq: Every day | ORAL | 3 refills | Status: DC

## 2022-05-21 NOTE — Patient Instructions (Signed)

## 2022-05-21 NOTE — Progress Notes (Signed)
Subjective:  Patient ID: Sophia [name], female    DOB: 02-18-1932  Age: 89 y.o. MRN: [id number]  CC: Medical Management of Chronic Issues   HPI Sophia [name] presents for presents forFollow-up of diabetes. Patient does not check glucose.  Patient denies symptoms such as polyuria, polydipsia, excessive hunger, nausea No significant hypoglycemic spells noted. Medications reviewed. Pt reports taking them regularly without complication/adverse reaction being reported today.   Leaned over to pick up a gallon of milk at the store, started feeling low back pain. Onset 1 week ago. A little better. Still moderate.  Lab Results  Component Value Date   HGBA1C 6.7 (H) 05/21/2022   HGBA1C 6.0 (H) 12/11/2021   HGBA1C 5.8 (H) 08/11/2021         05/21/2022    1:20 PM 12/11/2021    2:01 PM 08/11/2021    1:00 PM  Depression screen PHQ 2/9  Decreased Interest 0 0 0  Down, Depressed, Hopeless 0 0 0  PHQ - 2 Score 0 0 0    History Francis has a past medical history of Cancer ([place]), Chronic kidney disease, Diabetes mellitus without complication ([place]), GERD (gastroesophageal reflux disease), Hyperlipidemia, Hypertension, and Hypothyroidism.   She has a past surgical history that includes Cholecystectomy; Sigmoidoscopy; Esophagogastroduodenoscopy (N/A, 04/29/2015); Eye surgery (Right, 01/19/2019); Colonoscopy; Excision basal cell carcinoma (N/A, 05/21/2020); and Adjacent tissue transfer/tissue rearrangement (N/A, 05/21/2020).   Her family history includes Colon cancer in her sister; Heart disease in her brother, father, and sister.She reports that she has never smoked. She has never used smokeless tobacco. She reports that she does not drink alcohol and does not use drugs.    ROS Review of Systems  Constitutional: Negative.   HENT: Negative.    Eyes:  Negative for visual disturbance.  Respiratory:  Negative for shortness of breath.   Cardiovascular:  Negative for chest pain.   Gastrointestinal:  Negative for abdominal pain.  Musculoskeletal:  Positive for back pain. Negative for arthralgias.    Objective:  BP (!) 142/60   Pulse 60   Temp (!) 97 F (36.1 C)   Ht _0  (1.575 m)   Wt 115 lb 9.6 oz (52.4 kg)   SpO2 97%   BMI 21.14 kg/m   BP Readings from Last 3 Encounters:  05/21/22 (!) 142/60  12/11/21 (!) 141/61  08/11/21 127/63    Wt Readings from Last 3 Encounters:  05/21/22 115 lb 9.6 oz (52.4 kg)  12/11/21 122 lb 6.4 oz (55.5 kg)  08/11/21 119 lb (54 kg)     Physical Exam Constitutional:      General: She is not in acute distress.    Appearance: She is well-developed.  Cardiovascular:     Rate and Rhythm: Normal rate and regular rhythm.  Pulmonary:     Breath sounds: Normal breath sounds.  Musculoskeletal:        General: Tenderness (lumbar baack, midline) present. Normal range of motion.  Skin:    General: Skin is warm and dry.  Neurological:     Mental Status: She is alert and oriented to person, place, and time.       Assessment & Plan:   Arrionna was seen today for medical management of chronic issues.  Diagnoses and all orders for this visit:  Diabetes mellitus without complication ([place]) -     Bayer DCA Hb A1c Waived -     metFORMIN (GLUCOPHAGE-XR) 500 MG 24 hr tablet; Take 1 tablet (500 mg total) by mouth daily with  breakfast. -     olmesartan (BENICAR) 40 MG tablet; Take 1 tablet (40 mg total) by mouth daily.  Mixed hyperlipidemia -     Lipid panel  Hypothyroidism, unspecified type -     TSH + free T4  Essential (primary) hypertension -     CBC with Differential/Platelet -     CMP14+EGFR -     amLODipine (NORVASC) 5 MG tablet; Take 1 tablet (5 mg total) by mouth daily. -     carvedilol (COREG) 25 MG tablet; TAKE 1 TABLET BY MOUTH TWICE DAILY WITH MEALS . -     olmesartan (BENICAR) 40 MG tablet; Take 1 tablet (40 mg total) by mouth daily.  Other specified hypothyroidism -     Discontinue: levothyroxine  (EUTHYROX) 75 MCG tablet; TAKE 1 TABLET BY MOUTH ONCE DAILY BEFORE BREAKFAST .  Gastroesophageal reflux disease without esophagitis -     omeprazole (PRILOSEC) 40 MG capsule; TAKE 1 CAPSULE ON AN EMPTY STOMACH 1 HOUR BEFORE BREAKFAST AND ANOTHER AT BEDTIME (2 HOURS AFTER YOUR LAST FOOD AND DRINK EXCEPT WATER)  Urine frequency -     solifenacin (VESICARE) 10 MG tablet; Take 1 tablet (10 mg total) by mouth daily. For urinary control  Acute right-sided low back pain without sciatica -     DG Lumbar Spine 2-3 Views; Future  Age related osteoporosis, unspecified pathological fracture presence -     Cancel: DG Bone Density; Future       I have discontinued [name] Leung's ondansetron, levothyroxine, and benzonatate. I am also having her maintain her Calcium-Magnesium, blood glucose meter kit and supplies, ferrous sulfate, alendronate, cyanocobalamin, Magnesium, Dialyvite Vitamin D 5000, ibuprofen, simvastatin, amLODipine, carvedilol, metFORMIN, olmesartan, omeprazole, and solifenacin. We will continue to administer methylPREDNISolone acetate.  Allergies as of 05/21/2022   No Known Allergies      Medication List        Accurate as of May 21, 2022 11:59 PM. If you have any questions, ask your nurse or doctor.          STOP taking these medications    benzonatate 200 MG capsule Commonly known as: TESSALON Stopped by: Claretta Fraise, MD   ondansetron 4 MG tablet Commonly known as: Zofran Stopped by: Claretta Fraise, MD       TAKE these medications    alendronate 70 MG tablet Commonly known as: FOSAMAX Take 1 tablet (70 mg total) by mouth every 7 (seven) days. Take with a full glass of water on an empty stomach. What changed: when to take this   amLODipine 5 MG tablet Commonly known as: NORVASC Take 1 tablet (5 mg total) by mouth daily.   blood glucose meter kit and supplies Kit Use up to 4x daily prn   Calcium-Magnesium 500-250 MG Tabs Take 1 tablet by mouth  daily with supper.   carvedilol 25 MG tablet Commonly known as: COREG TAKE 1 TABLET BY MOUTH TWICE DAILY WITH MEALS .   cyanocobalamin 1000 MCG tablet Commonly known as: VITAMIN B12 Take 1,000 mcg by mouth daily.   Dialyvite Vitamin D 5000 125 MCG (5000 UT) capsule Generic drug: Cholecalciferol Take 5,000 Units by mouth daily.   ferrous sulfate 325 (65 FE) MG tablet Take 1 tablet (325 mg total) by mouth daily with breakfast.   ibuprofen 200 MG tablet Commonly known as: ADVIL Take 400 mg by mouth every 6 (six) hours as needed for headache or moderate pain.   levothyroxine 75 MCG tablet Commonly known as: Euthyrox TAKE  1 TABLET BY MOUTH ONCE DAILY BEFORE BREAKFAST .   Magnesium 250 MG Tabs Take 250 mg by mouth daily.   metFORMIN 500 MG 24 hr tablet Commonly known as: GLUCOPHAGE-XR Take 1 tablet (500 mg total) by mouth daily with breakfast.   olmesartan 40 MG tablet Commonly known as: BENICAR Take 1 tablet (40 mg total) by mouth daily.   omeprazole 40 MG capsule Commonly known as: PRILOSEC TAKE 1 CAPSULE ON AN EMPTY STOMACH 1 HOUR BEFORE BREAKFAST AND ANOTHER AT BEDTIME (2 HOURS AFTER YOUR LAST FOOD AND DRINK EXCEPT WATER)   simvastatin 40 MG tablet Commonly known as: ZOCOR TAKE 1/2 (ONE-HALF) TABLET BY MOUTH ONCE DAILY AT  6PM   solifenacin 10 MG tablet Commonly known as: VESICARE Take 1 tablet (10 mg total) by mouth daily. For urinary control         Follow-up: Return in about 6 months (around 11/19/2022).  Claretta Fraise, M.D.

## 2022-05-22 ENCOUNTER — Other Ambulatory Visit: Payer: Self-pay | Admitting: Family Medicine

## 2022-05-22 DIAGNOSIS — E038 Other specified hypothyroidism: Secondary | ICD-10-CM

## 2022-05-22 LAB — CMP14+EGFR
ALT: 13 IU/L (ref 0–32)
AST: 20 IU/L (ref 0–40)
Albumin/Globulin Ratio: 1.7 (ref 1.2–2.2)
Albumin: 4.2 g/dL (ref 3.7–4.7)
Alkaline Phosphatase: 68 IU/L (ref 44–121)
BUN/Creatinine Ratio: 19 (ref 12–28)
BUN: 21 mg/dL (ref 8–27)
Bilirubin Total: 0.5 mg/dL (ref 0.0–1.2)
CO2: 24 mmol/L (ref 20–29)
Calcium: 9.5 mg/dL (ref 8.7–10.3)
Chloride: 100 mmol/L (ref 96–106)
Creatinine, Ser: 1.12 mg/dL — ABNORMAL HIGH (ref 0.57–1.00)
Globulin, Total: 2.5 g/dL (ref 1.5–4.5)
Glucose: 163 mg/dL — ABNORMAL HIGH (ref 70–99)
Potassium: 4.4 mmol/L (ref 3.5–5.2)
Sodium: 141 mmol/L (ref 134–144)
Total Protein: 6.7 g/dL (ref 6.0–8.5)
eGFR: 47 mL/min/{1.73_m2} — ABNORMAL LOW (ref >59)

## 2022-05-22 LAB — CBC WITH DIFFERENTIAL/PLATELET
Basophils Absolute: 0.1 10*3/uL (ref 0.0–0.2)
Basos: 1 %
EOS (ABSOLUTE): 0.3 10*3/uL (ref 0.0–0.4)
Eos: 5 %
Hematocrit: 36.6 % (ref 34.0–46.6)
Hemoglobin: 12.1 g/dL (ref 11.1–15.9)
Immature Grans (Abs): 0 10*3/uL (ref 0.0–0.1)
Immature Granulocytes: 0 %
Lymphocytes Absolute: 1.5 10*3/uL (ref 0.7–3.1)
Lymphs: 27 %
MCH: 32.6 pg (ref 26.6–33.0)
MCHC: 33.1 g/dL (ref 31.5–35.7)
MCV: 99 fL — ABNORMAL HIGH (ref 79–97)
Monocytes Absolute: 0.5 10*3/uL (ref 0.1–0.9)
Monocytes: 8 %
Neutrophils Absolute: 3.3 10*3/uL (ref 1.4–7.0)
Neutrophils: 59 %
Platelets: 291 10*3/uL (ref 150–450)
RBC: 3.71 x10E6/uL — ABNORMAL LOW (ref 3.77–5.28)
RDW: 13.1 % (ref 11.7–15.4)
WBC: 5.7 10*3/uL (ref 3.4–10.8)

## 2022-05-22 LAB — LIPID PANEL
Chol/HDL Ratio: 2.5 ratio (ref 0.0–4.4)
Cholesterol, Total: 174 mg/dL (ref 100–199)
HDL: 71 mg/dL (ref >39)
LDL Chol Calc (NIH): 75 mg/dL (ref 0–99)
Triglycerides: 170 mg/dL — ABNORMAL HIGH (ref 0–149)
VLDL Cholesterol Cal: 28 mg/dL (ref 5–40)

## 2022-05-22 LAB — TSH+FREE T4
Free T4: 1.33 ng/dL (ref 0.82–1.77)
TSH: 12.6 u[IU]/mL — ABNORMAL HIGH (ref 0.450–4.500)

## 2022-05-22 MED ORDER — LEVOTHYROXINE SODIUM 100 MCG PO TABS: 100.0000 ug | ORAL_TABLET | Freq: Every day | ORAL | 1 refills | Status: DC

## 2022-05-25 ENCOUNTER — Other Ambulatory Visit: Payer: Self-pay | Admitting: *Deleted

## 2022-05-25 DIAGNOSIS — E039 Hypothyroidism, unspecified: Secondary | ICD-10-CM

## 2022-05-26 ENCOUNTER — Encounter: Payer: Self-pay | Admitting: Family Medicine

## 2022-06-03 ENCOUNTER — Other Ambulatory Visit (INDEPENDENT_AMBULATORY_CARE_PROVIDER_SITE_OTHER): Payer: Medicare Other

## 2022-06-03 ENCOUNTER — Other Ambulatory Visit: Payer: Self-pay | Admitting: Family Medicine

## 2022-06-03 DIAGNOSIS — Z780 Asymptomatic menopausal state: Secondary | ICD-10-CM

## 2022-06-03 MED ORDER — RISEDRONATE SODIUM 150 MG PO TABS: 150.0000 mg | ORAL_TABLET | ORAL | 3 refills | Status: DC

## 2022-06-29 ENCOUNTER — Telehealth: Payer: Self-pay | Admitting: Family Medicine

## 2022-06-29 NOTE — Telephone Encounter (Signed)
  Line busy unable to leave a message for patient to call back and schedule Medicare Annual Wellness Visit (AWV) to be completed by video or phone.  No hx of AWV eligible for AWVI per [employer] as of  05/18/2009   Please schedule at anytime with [place]   Any questions, please call me at [phone]   Thank you,   [facility] Ambulatory Clinical Support for [place] Are. We Are. One [facility] ??[phone] or ??[phone]

## 2022-07-11 ENCOUNTER — Other Ambulatory Visit: Payer: Self-pay | Admitting: Family Medicine

## 2022-07-11 DIAGNOSIS — E782 Mixed hyperlipidemia: Secondary | ICD-10-CM

## 2022-08-07 ENCOUNTER — Telehealth: Payer: Self-pay | Admitting: Family Medicine

## 2022-08-07 NOTE — Telephone Encounter (Signed)
Called patient to schedule Medicare Annual Wellness Visit (AWV). Left message for patient to call back and schedule Medicare Annual Wellness Visit (AWV).  Last date of AWV: due 05/18/2009 awvi per [employer]  Please schedule an appointment at any time with either Laura or Courtney, NHA's. .  If any questions, please contact me at [phone].  Thank you,  Stephanie,  AMB Clinical Support [facility] AWV Program Direct Dial ??[phone]    

## 2022-08-07 NOTE — Telephone Encounter (Signed)
Contacted Sophia [name] to schedule their annual wellness visit. Appointment made for 08/19/2022.  Thank you,  Colletta [place],  [place] Program Direct Dial ??[phone]

## 2022-08-19 ENCOUNTER — Ambulatory Visit (INDEPENDENT_AMBULATORY_CARE_PROVIDER_SITE_OTHER): Payer: Medicare Other

## 2022-08-19 VITALS — Ht 62.0 in | Wt 115.0 lb

## 2022-08-19 DIAGNOSIS — Z0000 Encounter for general adult medical examination without abnormal findings: Secondary | ICD-10-CM

## 2022-08-19 NOTE — Progress Notes (Signed)
Subjective:   Sophia [name] is a 89 y.o. female who presents for an Initial Medicare Annual Wellness Visit.  I connected with  Sophia [name] on 08/19/22 by a audio enabled telemedicine application and verified that I am speaking with the correct person using two identifiers.  Patient Location: Home  Provider Location: Home Office  I discussed the limitations of evaluation and management by telemedicine. The patient expressed understanding and agreed to proceed.  Review of Systems     Cardiac Risk Factors include: advanced age (>66men, >29 women);diabetes mellitus;hypertension;dyslipidemia     Objective:    Today's Vitals   08/19/22 1528  Weight: 115 lb (52.2 kg)  Height: 5\' 2"  (1.575 m)   Body mass index is 21.03 kg/m.     08/19/2022    3:31 PM 06/11/2020   12:41 PM 05/21/2020    9:55 AM 06/01/2016    5:33 AM 05/02/2015    7:00 AM 04/29/2015    7:17 PM  Advanced Directives  Does Patient Have a Medical Advance Directive? Yes No No No No No  Type of Advance Directive Living will;Healthcare Power of Attorney       Does patient want to make changes to medical advance directive? No - Patient declined       Copy of [place] in Chart? No - copy requested       Would patient like information on creating a medical advance directive?  No - Patient declined No - Patient declined No - Patient declined      Current Medications (verified) Outpatient Encounter Medications as of 08/19/2022  Medication Sig   amLODipine (NORVASC) 5 MG tablet Take 1 tablet (5 mg total) by mouth daily.   blood glucose meter kit and supplies KIT Use up to 4x daily prn   Calcium-Magnesium 500-250 MG TABS Take 1 tablet by mouth daily with supper.   carvedilol (COREG) 25 MG tablet TAKE 1 TABLET BY MOUTH TWICE DAILY WITH MEALS .   Cholecalciferol (DIALYVITE VITAMIN D 5000) 125 MCG (5000 UT) capsule Take 5,000 Units by mouth daily.   ferrous sulfate 325 (65 FE) MG tablet Take 1  tablet (325 mg total) by mouth daily with breakfast.   ibuprofen (ADVIL) 200 MG tablet Take 400 mg by mouth every 6 (six) hours as needed for headache or moderate pain.   levothyroxine (EUTHYROX) 100 MCG tablet Take 1 tablet (100 mcg total) by mouth daily.   Magnesium 250 MG TABS Take 250 mg by mouth daily.   metFORMIN (GLUCOPHAGE-XR) 500 MG 24 hr tablet Take 1 tablet (500 mg total) by mouth daily with breakfast.   olmesartan (BENICAR) 40 MG tablet Take 1 tablet (40 mg total) by mouth daily.   omeprazole (PRILOSEC) 40 MG capsule TAKE 1 CAPSULE ON AN EMPTY STOMACH 1 HOUR BEFORE BREAKFAST AND ANOTHER AT BEDTIME (2 HOURS AFTER YOUR LAST FOOD AND DRINK EXCEPT WATER)   risedronate (ACTONEL) 150 MG tablet Take 1 tablet (150 mg total) by mouth every 30 (thirty) days. with water, on an empty stomach, take nothing by mouth and to not lie down for 30 minutes after each dose.   simvastatin (ZOCOR) 40 MG tablet TAKE 1/2 (ONE-HALF) TABLET BY MOUTH ONCE DAILY AT  6  PM   solifenacin (VESICARE) 10 MG tablet Take 1 tablet (10 mg total) by mouth daily. For urinary control   vitamin B-12 (CYANOCOBALAMIN) 1000 MCG tablet Take 1,000 mcg by mouth daily.   Facility-Administered Encounter Medications as of 08/19/2022  Medication   methylPREDNISolone acetate (DEPO-MEDROL) injection 40 mg    Allergies (verified) Patient has no known allergies.   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   Chronic kidney disease    Diabetes mellitus without complication    GERD (gastroesophageal reflux disease)    Hyperlipidemia    Hypertension    Hypothyroidism    Past Surgical History:  Procedure Laterality Date   ADJACENT TISSUE TRANSFER/TISSUE REARRANGEMENT N/A 05/21/2020   Procedure: closure with adjacent tissue transfer;  Surgeon: Cindra Presume, MD;  Location: [place];  Service: Plastics;  Laterality: N/A;   BASAL CELL CARCINOMA EXCISION N/A 05/21/2020   Procedure: excision of scalp  squamous cell carcinoma with frozen sections;  Surgeon: Cindra Presume, MD;  Location: [place] [facility];  Service: Plastics;  Laterality: N/A;  1.5 hours total   CHOLECYSTECTOMY     COLONOSCOPY     ESOPHAGOGASTRODUODENOSCOPY N/A 04/29/2015   Procedure: ESOPHAGOGASTRODUODENOSCOPY (EGD);  Surgeon: Clarene Essex, MD;  Location: [facility] ENDOSCOPY;  Service: Endoscopy;  Laterality: N/A;   EYE SURGERY Right 01/19/2019   blocked tear duct    SIGMOIDOSCOPY     Family History  Problem Relation Age of Onset   Heart disease Father    Colon cancer Sister    Heart disease Brother    Heart disease Sister    Social History   Socioeconomic History   Marital status: Widowed    Spouse name: Not on file   Number of children: Not on file   Years of education: Not on file   Highest education level: Not on file  Occupational History   Not on file  Tobacco Use   Smoking status: Never   Smokeless tobacco: Never  Vaping Use   Vaping Use: Never used  Substance and Sexual Activity   Alcohol use: No   Drug use: No   Sexual activity: Not Currently  Other Topics Concern   Not on file  Social History Narrative   Not on file   Social Determinants of Health   Financial Resource Strain: Not on file  Food Insecurity: Not on file  Transportation Needs: Not on file  Physical Activity: Not on file  Stress: Not on file  Social Connections: Not on file    Tobacco Counseling Counseling given: Not Answered   Clinical Intake:  Pre-visit preparation completed: Yes  Pain : No/denies pain  Diabetes: Yes CBG done?: No Did pt. bring in CBG monitor from home?: No  How often do you need to have someone help you when you read instructions, pamphlets, or other written materials from your doctor or pharmacy?: 1 - Never  Diabetic?Yes   Nutrition Risk Assessment:  Has the patient had any N/V/D within the last 2 months?  No  Does the patient have any non-healing wounds?  No  Has the patient had any unintentional  weight loss or weight gain?  No   Diabetes:  Is the patient diabetic?  Yes  If diabetic, was a CBG obtained today?  No  Did the patient bring in their glucometer from home?  No  How often do you monitor your CBG's? As needed .   Financial Strains and Diabetes Management:  Are you having any financial strains with the device, your supplies or your medication? No .  Does the patient want to be seen by Chronic Care Management for management of their diabetes?  No  Would the patient like to be  referred to a Nutritionist or for Diabetic Management?  No   Diabetic Exams:  Diabetic Eye Exam: Completed records requested  Diabetic Foot Exam: Overdue, Pt has been advised about the importance in completing this exam. Pt is scheduled for diabetic foot exam on at next office visit .   Interpreter Needed?: No  Information entered by :: Denman George LPN   Activities of Daily Living    08/19/2022    3:31 PM  In your present state of health, do you have any difficulty performing the following activities:  Hearing? 0  Vision? 0  Difficulty concentrating or making decisions? 0  Walking or climbing stairs? 0  Dressing or bathing? 0  Doing errands, shopping? 0  Preparing Food and eating ? N  Using the Toilet? N  In the past six months, have you accidently leaked urine? N  Do you have problems with loss of bowel control? N  Managing your Medications? N  Managing your Finances? N  Housekeeping or managing your Housekeeping? N    Patient Care Team: Claretta Fraise, MD as PCP - General (Family Medicine) [facility]  Indicate any recent Medical Services you may have received from other than [facility] providers in the past year (date may be approximate).     Assessment:   This is a routine wellness examination for [place].  Hearing/Vision screen Hearing Screening - Comments:: Denies hearing difficulties  Vision Screening - Comments:: Wears rx glasses - up to date with  routine eye exams with Dr Katy Fitch    Dietary issues and exercise activities discussed: Current Exercise Habits: Home exercise routine, Type of exercise: walking, Time (Minutes): 30, Frequency (Times/Week): 3, Weekly Exercise (Minutes/Week): 90, Intensity: Mild   Goals Addressed             This Visit's Progress    Remain active and independent        Depression Screen    05/21/2022    1:20 PM 12/11/2021    2:01 PM 08/11/2021    1:00 PM 06/10/2021    2:58 PM 04/29/2021    3:24 PM 04/29/2021    3:06 PM 11/14/2020    4:39 PM  PHQ 2/9 Scores  PHQ - 2 Score 0 0 0 0 1 0 0  PHQ- 9 Score     3  2    Fall Risk    08/19/2022    3:30 PM 05/21/2022    1:20 PM 12/11/2021    2:01 PM 08/11/2021    1:00 PM 06/10/2021    2:58 PM  [place] in the past year? 0 0 0 0 1  Number falls in past yr: 0    1  Injury with Fall? 0    0  Risk for fall due to : No Fall Risks    History of fall(s)  Follow up Falls prevention discussed;Education provided;Falls evaluation completed    Falls evaluation completed    FALL RISK PREVENTION PERTAINING TO THE HOME:  Any stairs in or around the home? No  If so, are there any without handrails? No  Home free of loose throw rugs in walkways, pet beds, electrical cords, etc? Yes  Adequate lighting in your home to reduce risk of falls? Yes   ASSISTIVE DEVICES UTILIZED TO PREVENT FALLS:  Life alert? No  Use of a cane, walker or w/c? No  Grab bars in the bathroom? Yes  Shower chair or bench in shower? No  Elevated toilet seat or  a handicapped toilet? Yes   TIMED UP AND GO:  Was the test performed? No . Telephonic visit  Cognitive Function:        08/19/2022    3:31 PM  6CIT Screen  What Year? 0 points  What month? 0 points  What time? 0 points  Count back from 20 0 points  Months in reverse 0 points  Repeat phrase 0 points  Total Score 0 points    Immunizations Immunization History  Administered Date(s) Administered   Fluad Quad(high Dose  65+) 04/29/2021   Influenza, High Dose Seasonal PF 03/13/2013, 02/04/2016, 02/17/2017, 02/04/2018   Influenza,inj,Quad PF,6+ Mos 03/18/2015, 02/17/2017   Influenza-Unspecified 03/03/1997, 01/30/2019, 02/11/2020   Moderna Sars-Covid-2 Vaccination 08/28/2019, 09/25/2019   PFIZER(Purple Top)SARS-COV-2 Vaccination 06/11/2020   Pneumococcal Conjugate-13 07/22/2017   Pneumococcal Polysaccharide-23 05/18/2002   Tdap 04/29/2015   Unspecified SARS-COV-2 Vaccination 02/28/2022   Zoster Recombinat (Shingrix) 02/16/2017, 05/10/2017    TDAP status: Up to date  Flu Vaccine status: Completed at today's visit  Pneumococcal vaccine status: Up to date  Covid-19 vaccine status: Information provided on how to obtain vaccines.   Qualifies for Shingles Vaccine? Yes   Zostavax completed No   Shingrix Completed?: Yes  Screening Tests Health Maintenance  Topic Date Due   FOOT EXAM  09/23/2021   COVID-19 Vaccine (5 - 2023-24 season) 04/25/2022   OPHTHALMOLOGY EXAM  07/31/2022   HEMOGLOBIN A1C  11/19/2022   INFLUENZA VACCINE  12/17/2022   Medicare Annual Wellness (AWV)  08/19/2023   DTaP/Tdap/Td (2 - Td or Tdap) 04/28/2025   Pneumonia Vaccine 35+ Years old  Completed   DEXA SCAN  Completed   Zoster Vaccines- Shingrix  Completed   HPV VACCINES  Aged Out    Health Maintenance  Health Maintenance Due  Topic Date Due   FOOT EXAM  09/23/2021   COVID-19 Vaccine (5 - 2023-24 season) 04/25/2022   OPHTHALMOLOGY EXAM  07/31/2022    Colorectal cancer screening: No longer required.   Mammogram status: No longer required due to age.  Bone Density status: Completed 06/03/22. Results reflect: Bone density results: OSTEOPOROSIS. Repeat every 2 years.  Lung Cancer Screening: (Low Dose CT Chest recommended if Age 87-80 years, 30 pack-year currently smoking OR have quit w/in 15years.) does not qualify.   Lung Cancer Screening Referral: n/a  Additional Screening:  Hepatitis C Screening: does not  qualify  Vision Screening: Recommended annual ophthalmology exams for early detection of glaucoma and other disorders of the eye. Is the patient up to date with their annual eye exam?  Yes  Who is the provider or what is the name of the office in which the patient attends annual eye exams? [facility]  If pt is not established with a provider, would they like to be referred to a provider to establish care? No .   Dental Screening: Recommended annual dental exams for proper oral hygiene  Community Resource Referral / Chronic Care Management: CRR required this visit?  No   CCM required this visit?  No      Plan:     I have personally reviewed and noted the following in the patient's chart:   Medical and social history Use of alcohol, tobacco or illicit drugs  Current medications and supplements including opioid prescriptions. Patient is not currently taking opioid prescriptions. Functional ability and status Nutritional status Physical activity Advanced directives List of other physicians Hospitalizations, surgeries, and ER visits in previous 12 months Vitals Screenings to include cognitive, depression, and falls  Referrals and appointments  In addition, I have reviewed and discussed with patient certain preventive protocols, quality metrics, and best practice recommendations. A written personalized care plan for preventive services as well as general preventive health recommendations were provided to patient.     Vanetta Mulders, [place]   [id number]   Due to this being a virtual visit, the after visit summary with patients personalized plan was offered to patient via mail or my-chart.  per request, patient was mailed a copy of AVS  Nurse Notes: No concerns

## 2022-08-19 NOTE — Patient Instructions (Signed)
[name] , Thank you for taking time to come for your Medicare Wellness Visit. I appreciate your ongoing commitment to your health goals. Please review the following plan we discussed and let me know if I can assist you in the future.   These are the goals we discussed:  Goals      Remain active and independent        This is a list of the screening recommended for you and due dates:  Health Maintenance  Topic Date Due   Complete foot exam   09/23/2021   COVID-19 Vaccine (5 - 2023-24 season) 04/25/2022   Eye exam for diabetics  07/31/2022   Hemoglobin A1C  11/19/2022   Flu Shot  12/17/2022   Medicare Annual Wellness Visit  08/19/2023   DTaP/Tdap/Td vaccine (2 - Td or Tdap) 04/28/2025   Pneumonia Vaccine  Completed   DEXA scan (bone density measurement)  Completed   Zoster (Shingles) Vaccine  Completed   HPV Vaccine  Aged Out    Advanced directives: Forms are available if you choose in the future to pursue completion.  This is recommended in order to make sure that your health wishes are honored in the event that you are unable to verbalize them to the provider.    Conditions/risks identified: Aim for 30 minutes of exercise or brisk walking, 6-8 glasses of water, and 5 servings of fruits and vegetables each day.   Next appointment: Follow up in one year for your annual wellness visit    Preventive Care 65 Years and Older, Female Preventive care refers to lifestyle choices and visits with your health care provider that can promote health and wellness. What does preventive care include? A yearly physical exam. This is also called an annual well check. Dental exams once or twice a year. Routine eye exams. Ask your health care provider how often you should have your eyes checked. Personal lifestyle choices, including: Daily care of your teeth and gums. Regular physical activity. Eating a healthy diet. Avoiding tobacco and drug use. Limiting alcohol use. Practicing safe  sex. Taking low-dose aspirin every day. Taking vitamin and mineral supplements as recommended by your health care provider. What happens during an annual well check? The services and screenings done by your health care provider during your annual well check will depend on your age, overall health, lifestyle risk factors, and family history of disease. Counseling  Your health care provider may ask you questions about your: Alcohol use. Tobacco use. Drug use. Emotional well-being. Home and relationship well-being. Sexual activity. Eating habits. History of falls. Memory and ability to understand (cognition). Work and work Statistician. Reproductive health. Screening  You may have the following tests or measurements: Height, weight, and BMI. Blood pressure. Lipid and cholesterol levels. These may be checked every 5 years, or more frequently if you are over 102 years old. Skin check. Lung cancer screening. You may have this screening every year starting at age 15 if you have a 30-pack-year history of smoking and currently smoke or have quit within the past 15 years. Fecal occult blood test (FOBT) of the stool. You may have this test every year starting at age 62. Flexible sigmoidoscopy or colonoscopy. You may have a sigmoidoscopy every 5 years or a colonoscopy every 10 years starting at age 78. Hepatitis C blood test. Hepatitis B blood test. Sexually transmitted disease (STD) testing. Diabetes screening. This is done by checking your blood sugar (glucose) after you have not eaten for a while (  fasting). You may have this done every 1-3 years. Bone density scan. This is done to screen for osteoporosis. You may have this done starting at age 5. Mammogram. This may be done every 1-2 years. Talk to your health care provider about how often you should have regular mammograms. Talk with your health care provider about your test results, treatment options, and if necessary, the need for more  tests. Vaccines  Your health care provider may recommend certain vaccines, such as: Influenza vaccine. This is recommended every year. Tetanus, diphtheria, and acellular pertussis (Tdap, Td) vaccine. You may need a Td booster every 10 years. Zoster vaccine. You may need this after age 59. Pneumococcal 13-valent conjugate (PCV13) vaccine. One dose is recommended after age 64. Pneumococcal polysaccharide (PPSV23) vaccine. One dose is recommended after age 74. Talk to your health care provider about which screenings and vaccines you need and how often you need them. This information is not intended to replace advice given to you by your health care provider. Make sure you discuss any questions you have with your health care provider. Document Released: 05/31/2015 Document Revised: 01/22/2016 Document Reviewed: 03/05/2015 Elsevier Interactive Patient Education  2017 [place] Prevention in the Home Falls can cause injuries. They can happen to people of all ages. There are many things you can do to make your home safe and to help prevent falls. What can I do on the outside of my home? Regularly fix the edges of walkways and driveways and fix any cracks. Remove anything that might make you trip as you walk through a door, such as a raised step or threshold. Trim any bushes or trees on the path to your home. Use bright outdoor lighting. Clear any walking paths of anything that might make someone trip, such as rocks or tools. Regularly check to see if handrails are loose or broken. Make sure that both sides of any steps have handrails. Any raised decks and porches should have guardrails on the edges. Have any leaves, snow, or ice cleared regularly. Use sand or salt on walking paths during winter. Clean up any spills in your garage right away. This includes oil or grease spills. What can I do in the bathroom? Use night lights. Install grab bars by the toilet and in the tub and shower.  Do not use towel bars as grab bars. Use non-skid mats or decals in the tub or shower. If you need to sit down in the shower, use a plastic, non-slip stool. Keep the floor dry. Clean up any water that spills on the floor as soon as it happens. Remove soap buildup in the tub or shower regularly. Attach bath mats securely with double-sided non-slip rug tape. Do not have throw rugs and other things on the floor that can make you trip. What can I do in the bedroom? Use night lights. Make sure that you have a light by your bed that is easy to reach. Do not use any sheets or blankets that are too big for your bed. They should not hang down onto the floor. Have a firm chair that has side arms. You can use this for support while you get dressed. Do not have throw rugs and other things on the floor that can make you trip. What can I do in the kitchen? Clean up any spills right away. Avoid walking on wet floors. Keep items that you use a lot in easy-to-reach places. If you need to reach something above you, use  a strong step stool that has a grab bar. Keep electrical cords out of the way. Do not use floor polish or wax that makes floors slippery. If you must use wax, use non-skid floor wax. Do not have throw rugs and other things on the floor that can make you trip. What can I do with my stairs? Do not leave any items on the stairs. Make sure that there are handrails on both sides of the stairs and use them. Fix handrails that are broken or loose. Make sure that handrails are as long as the stairways. Check any carpeting to make sure that it is firmly attached to the stairs. Fix any carpet that is loose or worn. Avoid having throw rugs at the top or bottom of the stairs. If you do have throw rugs, attach them to the floor with carpet tape. Make sure that you have a light switch at the top of the stairs and the bottom of the stairs. If you do not have them, ask someone to add them for you. What else  can I do to help prevent falls? Wear shoes that: Do not have high heels. Have rubber bottoms. Are comfortable and fit you well. Are closed at the toe. Do not wear sandals. If you use a stepladder: Make sure that it is fully opened. Do not climb a closed stepladder. Make sure that both sides of the stepladder are locked into place. Ask someone to hold it for you, if possible. Clearly mark and make sure that you can see: Any grab bars or handrails. First and last steps. Where the edge of each step is. Use tools that help you move around (mobility aids) if they are needed. These include: Canes. Walkers. Scooters. Crutches. Turn on the lights when you go into a dark area. Replace any light bulbs as soon as they burn out. Set up your furniture so you have a clear path. Avoid moving your furniture around. If any of your floors are uneven, fix them. If there are any pets around you, be aware of where they are. Review your medicines with your doctor. Some medicines can make you feel dizzy. This can increase your chance of falling. Ask your doctor what other things that you can do to help prevent falls. This information is not intended to replace advice given to you by your health care provider. Make sure you discuss any questions you have with your health care provider. Document Released: 02/28/2009 Document Revised: 10/10/2015 Document Reviewed: 06/08/2014 Elsevier Interactive Patient Education  2017 [employer].

## 2022-11-16 ENCOUNTER — Other Ambulatory Visit: Payer: Self-pay | Admitting: Family Medicine

## 2022-11-16 DIAGNOSIS — E038 Other specified hypothyroidism: Secondary | ICD-10-CM

## 2022-12-21 ENCOUNTER — Ambulatory Visit (INDEPENDENT_AMBULATORY_CARE_PROVIDER_SITE_OTHER): Payer: Medicare Other

## 2022-12-21 ENCOUNTER — Ambulatory Visit: Payer: Medicare Other | Admitting: Family Medicine

## 2022-12-21 ENCOUNTER — Encounter: Payer: Self-pay | Admitting: Family Medicine

## 2022-12-21 VITALS — BP 150/61 | HR 62 | Temp 97.5°F | Ht 62.0 in | Wt 118.4 lb

## 2022-12-21 DIAGNOSIS — E782 Mixed hyperlipidemia: Secondary | ICD-10-CM

## 2022-12-21 DIAGNOSIS — I10 Essential (primary) hypertension: Secondary | ICD-10-CM | POA: Diagnosis not present

## 2022-12-21 DIAGNOSIS — E119 Type 2 diabetes mellitus without complications: Secondary | ICD-10-CM | POA: Diagnosis not present

## 2022-12-21 DIAGNOSIS — E039 Hypothyroidism, unspecified: Secondary | ICD-10-CM

## 2022-12-21 DIAGNOSIS — M79644 Pain in right finger(s): Secondary | ICD-10-CM

## 2022-12-21 LAB — BAYER DCA HB A1C WAIVED: HB A1C (BAYER DCA - WAIVED): 6.1 % — ABNORMAL HIGH (ref 4.8–5.6)

## 2022-12-21 NOTE — Progress Notes (Signed)
Subjective:  Patient ID: Sophia [name],  female    DOB: 1932-01-18  Age: 89 y.o.    CC: Medical Management of Chronic Issues   HPI Sophia [name] presents for  follow-up of hypertension. Patient has no history of headache chest pain or shortness of breath or recent cough. Patient also denies symptoms of TIA such as numbness weakness lateralizing. Patient denies side effects from medication. States taking it regularly.  Patient also  in for follow-up of elevated cholesterol. Doing well without complaints on current medication. Denies side effects  including myalgia and arthralgia and nausea. Also in today for liver function testing. Currently no chest pain, shortness of breath or other cardiovascular related symptoms noted.  Follow-up of diabetes. Patient does not check blood sugar at home. Patient denies symptoms such as excessive hunger or urinary frequency, excessive hunger, nausea No significant hypoglycemic spells noted. Medications reviewed. Pt reports taking them regularly. Pt. denies complication/adverse reaction today.    follow-up on  thyroid. The patient has a history of hypothyroidism for many years. It has been stable recently. Pt. denies any change in  voice, loss of hair, heat or cold intolerance. Energy level has been adequate to good. Patient denies constipation and diarrhea. No myxedema. Medication is as noted below. Verified that pt is taking it daily on an empty stomach. Well tolerated.  Great grand daughter stepped on her hand 2-3 weeks ago. Now swollen, painful to flex.   History Sophia [name] has a past medical history of Cancer (HCC), Chronic kidney disease, Diabetes mellitus without complication (HCC), GERD (gastroesophageal reflux disease), Hyperlipidemia, Hypertension, and Hypothyroidism.   She has a past surgical history that includes Cholecystectomy; Sigmoidoscopy; Esophagogastroduodenoscopy (N/A, 04/29/2015); Eye surgery (Right, 01/19/2019); Colonoscopy;  Excision basal cell carcinoma (N/A, 05/21/2020); and Adjacent tissue transfer/tissue rearrangement (N/A, 05/21/2020).   Her family history includes Colon cancer in her sister; Heart disease in her brother, father, and sister.She reports that she has never smoked. She has never used smokeless tobacco. She reports that she does not drink alcohol and does not use drugs.  Current Outpatient Medications on File Prior to Visit  Medication Sig Dispense Refill   amLODipine (NORVASC) 5 MG tablet Take 1 tablet (5 mg total) by mouth daily. 90 tablet 3   blood glucose meter kit and supplies KIT Use up to 4x daily prn 1 each 0   Calcium-Magnesium 500-250 MG TABS Take 1 tablet by mouth daily with supper. 30 each 5   carvedilol (COREG) 25 MG tablet TAKE 1 TABLET BY MOUTH TWICE DAILY WITH MEALS . 180 tablet 3   Cholecalciferol (DIALYVITE VITAMIN D 5000) 125 MCG (5000 UT) capsule Take 5,000 Units by mouth daily.     ferrous sulfate 325 (65 FE) MG tablet Take 1 tablet (325 mg total) by mouth daily with breakfast.     ibuprofen (ADVIL) 200 MG tablet Take 400 mg by mouth every 6 (six) hours as needed for headache or moderate pain.     levothyroxine (SYNTHROID) 100 MCG tablet Take 1 tablet (100 mcg total) by mouth daily. (NEEDS LABWORK) 30 tablet 0   Magnesium 250 MG TABS Take 250 mg by mouth daily.     metFORMIN (GLUCOPHAGE-XR) 500 MG 24 hr tablet Take 1 tablet (500 mg total) by mouth daily with breakfast. 90 tablet 3   olmesartan (BENICAR) 40 MG tablet Take 1 tablet (40 mg total) by mouth daily. 90 tablet 3   omeprazole (PRILOSEC) 40 MG capsule TAKE 1 CAPSULE ON AN EMPTY STOMACH 1  HOUR BEFORE BREAKFAST AND ANOTHER AT BEDTIME (2 HOURS AFTER YOUR LAST FOOD AND DRINK EXCEPT WATER) 180 capsule 3   risedronate (ACTONEL) 150 MG tablet Take 1 tablet (150 mg total) by mouth every 30 (thirty) days. with water, on an empty stomach, take nothing by mouth and to not lie down for 30 minutes after each dose. 3 tablet 3   simvastatin  (ZOCOR) 40 MG tablet TAKE 1/2 (ONE-HALF) TABLET BY MOUTH ONCE DAILY AT  6  PM 45 tablet 2   solifenacin (VESICARE) 10 MG tablet Take 1 tablet (10 mg total) by mouth daily. For urinary control 90 tablet 3   vitamin B-12 (CYANOCOBALAMIN) 1000 MCG tablet Take 1,000 mcg by mouth daily.     Current Facility-Administered Medications on File Prior to Visit  Medication Dose Route Frequency Provider Last Rate Last Admin   methylPREDNISolone acetate (DEPO-MEDROL) injection 40 mg  40 mg Intramuscular Once Gwenlyn Fudge, FNP        ROS Review of Systems  Constitutional: Negative.   HENT: Negative.    Eyes:  Negative for visual disturbance.  Respiratory:  Negative for shortness of breath.   Cardiovascular:  Negative for chest pain.  Gastrointestinal:  Negative for abdominal pain.  Musculoskeletal:  Negative for arthralgias.    Objective:  BP (!) 150/61   Pulse 62   Temp (!) 97.5 F (36.4 C)   Ht 5\' 2"  (1.575 m)   Wt 118 lb 6.4 oz (53.7 kg)   SpO2 97%   BMI 21.66 kg/m   BP Readings from Last 3 Encounters:  12/21/22 (!) 150/61  05/21/22 (!) 142/60  12/11/21 (!) 141/61    Wt Readings from Last 3 Encounters:  12/21/22 118 lb 6.4 oz (53.7 kg)  08/19/22 115 lb (52.2 kg)  05/21/22 115 lb 9.6 oz (52.4 kg)     Physical Exam Constitutional:      General: She is not in acute distress.    Appearance: She is well-developed.  HENT:     Head: Normocephalic and atraumatic.  Eyes:     Conjunctiva/sclera: Conjunctivae normal.     Pupils: Pupils are equal, round, and reactive to light.  Neck:     Thyroid: No thyromegaly.  Cardiovascular:     Rate and Rhythm: Normal rate and regular rhythm.     Heart sounds: Normal heart sounds. No murmur heard. Pulmonary:     Effort: Pulmonary effort is normal. No respiratory distress.     Breath sounds: Normal breath sounds. No wheezing or rales.  Abdominal:     General: Bowel sounds are normal. There is no distension.     Palpations: Abdomen is  soft.     Tenderness: There is no abdominal tenderness.  Musculoskeletal:        General: Normal range of motion.     Cervical back: Normal range of motion and neck supple.  Lymphadenopathy:     Cervical: No cervical adenopathy.  Skin:    General: Skin is warm and dry.  Neurological:     Mental Status: She is alert and oriented to person, place, and time.  Psychiatric:        Behavior: Behavior normal.        Thought Content: Thought content normal.        Judgment: Judgment normal.     Diabetic Foot Exam - Simple   Simple Foot Form Diabetic Foot exam was performed with the following findings: Yes 12/21/2022  2:27 PM  Visual Inspection No deformities,  no ulcerations, no other skin breakdown bilaterally: Yes Sensation Testing Intact to touch and monofilament testing bilaterally: Yes Pulse Check Posterior Tibialis and Dorsalis pulse intact bilaterally: Yes Comments     Lab Results  Component Value Date   HGBA1C 6.1 (H) 12/21/2022   HGBA1C 6.7 (H) 05/21/2022   HGBA1C 6.0 (H) 12/11/2021    Assessment & Plan:   Sophia [name] was seen today for medical management of chronic issues.  Diagnoses and all orders for this visit:  Hypothyroidism, unspecified type -     TSH + free T4  Diabetes mellitus without complication (HCC) -     Bayer DCA Hb A1c Waived  Mixed hyperlipidemia -     Lipid panel  Essential (primary) hypertension -     CBC with Differential/Platelet -     CMP14+EGFR  Pain of right middle finger -     DG Finger Middle Right; Future   I am having Sophia [name] maintain her Calcium-Magnesium, blood glucose meter kit and supplies, ferrous sulfate, cyanocobalamin, Magnesium, Dialyvite Vitamin D 5000, ibuprofen, amLODipine, carvedilol, metFORMIN, olmesartan, omeprazole, solifenacin, risedronate, simvastatin, and levothyroxine. We will continue to administer methylPREDNISolone acetate.  No orders of the defined types were placed in this  encounter.    Follow-up: Return in about 6 months (around 06/23/2023).  Mechele Claude, M.D.

## 2022-12-22 ENCOUNTER — Other Ambulatory Visit: Payer: Self-pay | Admitting: Family Medicine

## 2022-12-22 DIAGNOSIS — E038 Other specified hypothyroidism: Secondary | ICD-10-CM

## 2022-12-22 NOTE — Progress Notes (Signed)
Hello Sophia [name],  Your lab result is normal and/or stable.Some minor variations that are not significant are commonly marked abnormal, but do not represent any medical problem for you.  Best regards,  , M.D.

## 2023-05-14 ENCOUNTER — Other Ambulatory Visit: Payer: Self-pay | Admitting: Family Medicine

## 2023-05-14 DIAGNOSIS — E782 Mixed hyperlipidemia: Secondary | ICD-10-CM

## 2023-05-14 MED ORDER — RISEDRONATE SODIUM 150 MG PO TABS: 150.0000 mg | ORAL_TABLET | ORAL | 0 refills | Status: DC

## 2023-05-14 NOTE — Addendum Note (Signed)
Addended by: Julious Payer D on: 05/14/2023 04:28 PM   Modules accepted: Orders

## 2023-05-26 ENCOUNTER — Other Ambulatory Visit: Payer: Self-pay | Admitting: Family Medicine

## 2023-05-26 DIAGNOSIS — I10 Essential (primary) hypertension: Secondary | ICD-10-CM

## 2023-06-09 ENCOUNTER — Other Ambulatory Visit: Payer: Self-pay | Admitting: Family Medicine

## 2023-06-09 DIAGNOSIS — E119 Type 2 diabetes mellitus without complications: Secondary | ICD-10-CM

## 2023-06-09 DIAGNOSIS — I10 Essential (primary) hypertension: Secondary | ICD-10-CM

## 2023-06-23 ENCOUNTER — Ambulatory Visit: Payer: Medicare Other | Admitting: Family Medicine

## 2023-06-23 ENCOUNTER — Other Ambulatory Visit: Payer: Self-pay | Admitting: Family Medicine

## 2023-06-23 DIAGNOSIS — I10 Essential (primary) hypertension: Secondary | ICD-10-CM

## 2023-06-28 ENCOUNTER — Other Ambulatory Visit: Payer: Self-pay | Admitting: *Deleted

## 2023-06-28 DIAGNOSIS — K219 Gastro-esophageal reflux disease without esophagitis: Secondary | ICD-10-CM

## 2023-06-28 MED ORDER — OMEPRAZOLE 40 MG PO CPDR: DELAYED_RELEASE_CAPSULE | ORAL | 0 refills | Status: DC

## 2023-07-20 ENCOUNTER — Ambulatory Visit: Payer: Medicare Other | Admitting: Family Medicine

## 2023-07-20 ENCOUNTER — Encounter: Payer: Self-pay | Admitting: Family Medicine

## 2023-07-20 VITALS — BP 153/69 | HR 67 | Temp 97.3°F | Ht 62.0 in | Wt 123.0 lb

## 2023-07-20 DIAGNOSIS — E782 Mixed hyperlipidemia: Secondary | ICD-10-CM

## 2023-07-20 DIAGNOSIS — E119 Type 2 diabetes mellitus without complications: Secondary | ICD-10-CM

## 2023-07-20 DIAGNOSIS — I10 Essential (primary) hypertension: Secondary | ICD-10-CM | POA: Diagnosis not present

## 2023-07-20 DIAGNOSIS — E039 Hypothyroidism, unspecified: Secondary | ICD-10-CM | POA: Diagnosis not present

## 2023-07-20 DIAGNOSIS — K219 Gastro-esophageal reflux disease without esophagitis: Secondary | ICD-10-CM

## 2023-07-20 LAB — BAYER DCA HB A1C WAIVED: HB A1C (BAYER DCA - WAIVED): 5.8 % — ABNORMAL HIGH (ref 4.8–5.6)

## 2023-07-20 MED ORDER — DONEPEZIL HCL 5 MG PO TABS: 5.0000 mg | ORAL_TABLET | Freq: Every day | ORAL | 1 refills | Status: DC

## 2023-07-20 MED ORDER — AMLODIPINE BESYLATE 5 MG PO TABS: 5.0000 mg | ORAL_TABLET | Freq: Every day | ORAL | 0 refills | Status: DC

## 2023-07-20 MED ORDER — SIMVASTATIN 40 MG PO TABS: ORAL_TABLET | ORAL | 0 refills | Status: DC

## 2023-07-20 MED ORDER — METFORMIN HCL ER 500 MG PO TB24: 500.0000 mg | ORAL_TABLET | Freq: Every day | ORAL | 0 refills | Status: DC

## 2023-07-20 MED ORDER — OLMESARTAN MEDOXOMIL 40 MG PO TABS: 40.0000 mg | ORAL_TABLET | Freq: Every day | ORAL | 0 refills | Status: DC

## 2023-07-20 MED ORDER — OMEPRAZOLE 40 MG PO CPDR
DELAYED_RELEASE_CAPSULE | ORAL | 0 refills | Status: DC
Start: 2023-07-20 — End: 2024-03-02

## 2023-07-20 MED ORDER — CARVEDILOL 25 MG PO TABS: 25.0000 mg | ORAL_TABLET | Freq: Two times a day (BID) | ORAL | 0 refills | Status: DC

## 2023-07-20 NOTE — Progress Notes (Unsigned)
 Subjective:  Patient ID: Sophia [name],  female    DOB: February 05, 1932  Age: 89 y.o.    CC: Medical Management of Chronic Issues (No concerns)   HPI Sophia [name] presents for  follow-up of hypertension. Patient has no history of headache chest pain or shortness of breath or recent cough. Patient also denies symptoms of TIA such as numbness weakness lateralizing. Patient denies side effects from medication. States taking it regularly.  Patient also  in for follow-up of elevated cholesterol. Doing well without complaints on current medication. Denies side effects  including myalgia and arthralgia and nausea. Also in today for liver function testing. Currently no chest pain, shortness of breath or other cardiovascular related symptoms noted.  Follow-up of diabetes. Patient does not check blood sugar at home. Patient denies symptoms such as excessive hunger or urinary frequency, excessive hunger, nausea No significant hypoglycemic spells noted. Medications reviewed. Pt reports taking them regularly. Pt. denies complication/adverse reaction today.    History Sophia [name] has a past medical history of Cancer (HCC), Chronic kidney disease, Diabetes mellitus without complication (HCC), GERD (gastroesophageal reflux disease), Hyperlipidemia, Hypertension, and Hypothyroidism.   She has a past surgical history that includes Cholecystectomy; Sigmoidoscopy; Esophagogastroduodenoscopy (N/A, 04/29/2015); Eye surgery (Right, 01/19/2019); Colonoscopy; Excision basal cell carcinoma (N/A, 05/21/2020); and Adjacent tissue transfer/tissue rearrangement (N/A, 05/21/2020).   Her family history includes Colon cancer in her sister; Heart disease in her brother, father, and sister.She reports that she has never smoked. She has never used smokeless tobacco. She reports that she does not drink alcohol and does not use drugs.  Current Outpatient Medications on File Prior to Visit  Medication Sig Dispense Refill  .  blood glucose meter kit and supplies KIT Use up to 4x daily prn 1 each 0  . Calcium-Magnesium 500-250 MG TABS Take 1 tablet by mouth daily with supper. 30 each 5  . Cholecalciferol (DIALYVITE VITAMIN D 5000) 125 MCG (5000 UT) capsule Take 5,000 Units by mouth daily.    . ferrous sulfate 325 (65 FE) MG tablet Take 1 tablet (325 mg total) by mouth daily with breakfast.    . ibuprofen (ADVIL) 200 MG tablet Take 400 mg by mouth every 6 (six) hours as needed for headache or moderate pain.    Marland Kitchen levothyroxine (SYNTHROID) 100 MCG tablet TAKE 1 TABLET BY MOUTH ONCE DAILY. NEEDS LABWORK. 90 tablet 3  . Magnesium 250 MG TABS Take 250 mg by mouth daily.    . risedronate (ACTONEL) 150 MG tablet Take 1 tablet (150 mg total) by mouth every 30 (thirty) days. with water, on an empty stomach, take nothing by mouth and to not lie down for 30 minutes after each dose. 3 tablet 0  . solifenacin (VESICARE) 10 MG tablet Take 1 tablet (10 mg total) by mouth daily. For urinary control 90 tablet 3  . vitamin B-12 (CYANOCOBALAMIN) 1000 MCG tablet Take 1,000 mcg by mouth daily.     Current Facility-Administered Medications on File Prior to Visit  Medication Dose Route Frequency Provider Last Rate Last Admin  . methylPREDNISolone acetate (DEPO-MEDROL) injection 40 mg  40 mg Intramuscular Once Gwenlyn Fudge, FNP        ROS Review of Systems  Constitutional: Negative.   HENT:  Negative for congestion.   Eyes:  Negative for visual disturbance.  Respiratory:  Negative for shortness of breath.   Cardiovascular:  Negative for chest pain.  Gastrointestinal:  Negative for abdominal pain, constipation, diarrhea, nausea and vomiting.  Genitourinary:  Negative  for difficulty urinating.  Musculoskeletal:  Negative for arthralgias and myalgias.  Neurological:  Negative for headaches.       Recognizes she is getting forgetful.  Psychiatric/Behavioral:  Negative for sleep disturbance.     Objective:  BP (!) 153/69   Pulse 67    Temp (!) 97.3 F (36.3 C)   Ht 5\' 2"  (1.575 m)   Wt 123 lb (55.8 kg)   SpO2 99%   BMI 22.50 kg/m   BP Readings from Last 3 Encounters:  07/20/23 (!) 153/69  12/21/22 (!) 150/61  05/21/22 (!) 142/60    Wt Readings from Last 3 Encounters:  07/20/23 123 lb (55.8 kg)  12/21/22 118 lb 6.4 oz (53.7 kg)  08/19/22 115 lb (52.2 kg)     Physical Exam Constitutional:      General: She is not in acute distress.    Appearance: She is well-developed.  Cardiovascular:     Rate and Rhythm: Normal rate and regular rhythm.  Pulmonary:     Breath sounds: Normal breath sounds.  Musculoskeletal:        General: Normal range of motion.  Skin:    General: Skin is warm and dry.  Neurological:     Mental Status: She is alert and oriented to person, place, and time.    Diabetic Foot Exam - Simple   No data filed     Lab Results  Component Value Date   HGBA1C 6.1 (H) 12/21/2022   HGBA1C 6.7 (H) 05/21/2022   HGBA1C 6.0 (H) 12/11/2021    Assessment & Plan:   Sophia [name] was seen today for medical management of chronic issues.  Diagnoses and all orders for this visit:  Hypothyroidism, unspecified type -     TSH + free T4  Diabetes mellitus without complication (HCC) -     Bayer DCA Hb A1c Waived -     metFORMIN (GLUCOPHAGE-XR) 500 MG 24 hr tablet; Take 1 tablet (500 mg total) by mouth daily with breakfast. -     olmesartan (BENICAR) 40 MG tablet; Take 1 tablet (40 mg total) by mouth daily.  Mixed hyperlipidemia -     Lipid panel -     simvastatin (ZOCOR) 40 MG tablet; TAKE 1/2 (ONE-HALF) TABLET BY MOUTH ONCE DAILY AT  6  PM  Essential (primary) hypertension -     CBC with Differential/Platelet -     CMP14+EGFR -     amLODipine (NORVASC) 5 MG tablet; Take 1 tablet (5 mg total) by mouth daily. -     carvedilol (COREG) 25 MG tablet; Take 1 tablet (25 mg total) by mouth 2 (two) times daily with a meal. -     olmesartan (BENICAR) 40 MG tablet; Take 1 tablet (40 mg total) by mouth  daily.  Gastroesophageal reflux disease without esophagitis -     omeprazole (PRILOSEC) 40 MG capsule; TAKE 1 CAPSULE ON AN EMPTY STOMACH 1 HOUR BEFORE BREAKFAST AND ANOTHER AT BEDTIME (2 HOURS AFTER YOUR LAST FOOD AND DRINK EXCEPT WATER)  Other orders -     donepezil (ARICEPT) 5 MG tablet; Take 1 tablet (5 mg total) by mouth at bedtime.   I have changed Sophia [name]'s amLODipine, carvedilol, metFORMIN, olmesartan, and simvastatin. I am also having her start on donepezil. Additionally, I am having her maintain her Calcium-Magnesium, blood glucose meter kit and supplies, ferrous sulfate, cyanocobalamin, Magnesium, Dialyvite Vitamin D 5000, ibuprofen, solifenacin, levothyroxine, risedronate, and omeprazole. We will continue to administer methylPREDNISolone acetate.  Meds ordered this  encounter  Medications  . amLODipine (NORVASC) 5 MG tablet    Sig: Take 1 tablet (5 mg total) by mouth daily.    Dispense:  90 tablet    Refill:  0  . carvedilol (COREG) 25 MG tablet    Sig: Take 1 tablet (25 mg total) by mouth 2 (two) times daily with a meal.    Dispense:  180 tablet    Refill:  0  . metFORMIN (GLUCOPHAGE-XR) 500 MG 24 hr tablet    Sig: Take 1 tablet (500 mg total) by mouth daily with breakfast.    Dispense:  90 tablet    Refill:  0  . olmesartan (BENICAR) 40 MG tablet    Sig: Take 1 tablet (40 mg total) by mouth daily.    Dispense:  90 tablet    Refill:  0  . omeprazole (PRILOSEC) 40 MG capsule    Sig: TAKE 1 CAPSULE ON AN EMPTY STOMACH 1 HOUR BEFORE BREAKFAST AND ANOTHER AT BEDTIME (2 HOURS AFTER YOUR LAST FOOD AND DRINK EXCEPT WATER)    Dispense:  180 capsule    Refill:  0  . simvastatin (ZOCOR) 40 MG tablet    Sig: TAKE 1/2 (ONE-HALF) TABLET BY MOUTH ONCE DAILY AT  6  PM    Dispense:  45 tablet    Refill:  0  . donepezil (ARICEPT) 5 MG tablet    Sig: Take 1 tablet (5 mg total) by mouth at bedtime.    Dispense:  30 tablet    Refill:  1     Follow-up: Return in about 3  months (around 10/20/2023) for diabetes.  Mechele Claude, M.D.

## 2023-07-21 ENCOUNTER — Encounter: Payer: Self-pay | Admitting: Family Medicine

## 2023-07-21 LAB — CMP14+EGFR
ALT: 10 IU/L (ref 0–32)
AST: 17 IU/L (ref 0–40)
Albumin: 4.1 g/dL (ref 3.7–4.7)
Alkaline Phosphatase: 56 IU/L (ref 44–121)
BUN/Creatinine Ratio: 19 (ref 12–28)
BUN: 22 mg/dL (ref 8–27)
Bilirubin Total: 0.5 mg/dL (ref 0.0–1.2)
CO2: 21 mmol/L (ref 20–29)
Calcium: 9.4 mg/dL (ref 8.7–10.3)
Chloride: 109 mmol/L — ABNORMAL HIGH (ref 96–106)
Creatinine, Ser: 1.16 mg/dL — ABNORMAL HIGH (ref 0.57–1.00)
Globulin, Total: 2.3 g/dL (ref 1.5–4.5)
Glucose: 146 mg/dL — ABNORMAL HIGH (ref 70–99)
Potassium: 4.3 mmol/L (ref 3.5–5.2)
Sodium: 143 mmol/L (ref 134–144)
Total Protein: 6.4 g/dL (ref 6.0–8.5)
eGFR: 45 mL/min/{1.73_m2} — ABNORMAL LOW (ref >59)

## 2023-07-21 LAB — CBC WITH DIFFERENTIAL/PLATELET
Basophils Absolute: 0 10*3/uL (ref 0.0–0.2)
Basos: 1 %
EOS (ABSOLUTE): 0.3 10*3/uL (ref 0.0–0.4)
Eos: 4 %
Hematocrit: 34.3 % (ref 34.0–46.6)
Hemoglobin: 11.4 g/dL (ref 11.1–15.9)
Immature Grans (Abs): 0 10*3/uL (ref 0.0–0.1)
Immature Granulocytes: 0 %
Lymphocytes Absolute: 2 10*3/uL (ref 0.7–3.1)
Lymphs: 28 %
MCH: 33.5 pg — ABNORMAL HIGH (ref 26.6–33.0)
MCHC: 33.2 g/dL (ref 31.5–35.7)
MCV: 101 fL — ABNORMAL HIGH (ref 79–97)
Monocytes Absolute: 0.6 10*3/uL (ref 0.1–0.9)
Monocytes: 8 %
Neutrophils Absolute: 4.2 10*3/uL (ref 1.4–7.0)
Neutrophils: 59 %
Platelets: 248 10*3/uL (ref 150–450)
RBC: 3.4 x10E6/uL — ABNORMAL LOW (ref 3.77–5.28)
RDW: 12 % (ref 11.7–15.4)
WBC: 7.1 10*3/uL (ref 3.4–10.8)

## 2023-07-21 LAB — LIPID PANEL
Chol/HDL Ratio: 3.2 ratio (ref 0.0–4.4)
Cholesterol, Total: 179 mg/dL (ref 100–199)
HDL: 56 mg/dL (ref >39)
LDL Chol Calc (NIH): 86 mg/dL (ref 0–99)
Triglycerides: 223 mg/dL — ABNORMAL HIGH (ref 0–149)
VLDL Cholesterol Cal: 37 mg/dL (ref 5–40)

## 2023-07-21 LAB — TSH+FREE T4
Free T4: 1.82 ng/dL — ABNORMAL HIGH (ref 0.82–1.77)
TSH: 0.554 u[IU]/mL (ref 0.450–4.500)

## 2023-07-24 LAB — FOLATE: Folate: 14 ng/mL (ref >3.0)

## 2023-07-24 LAB — VITAMIN B12: Vitamin B-12: >2000 pg/mL — ABNORMAL HIGH (ref 232–1245)

## 2023-07-24 LAB — SPECIMEN STATUS REPORT

## 2023-08-13 ENCOUNTER — Other Ambulatory Visit: Payer: Self-pay

## 2023-08-13 ENCOUNTER — Encounter (HOSPITAL_COMMUNITY): Payer: Self-pay

## 2023-08-13 ENCOUNTER — Observation Stay (HOSPITAL_COMMUNITY)
Admission: EM | Admit: 2023-08-13 | Discharge: 2023-08-14 | Disposition: A | Discharge status: Home / Self Care | Attending: Family Medicine | Admitting: Family Medicine

## 2023-08-13 ENCOUNTER — Emergency Department (HOSPITAL_COMMUNITY)

## 2023-08-13 DIAGNOSIS — E1122 Type 2 diabetes mellitus with diabetic chronic kidney disease: Secondary | ICD-10-CM | POA: Diagnosis not present

## 2023-08-13 DIAGNOSIS — I10 Essential (primary) hypertension: Secondary | ICD-10-CM | POA: Diagnosis present

## 2023-08-13 DIAGNOSIS — I63311 Cerebral infarction due to thrombosis of right middle cerebral artery: Principal | ICD-10-CM | POA: Insufficient documentation

## 2023-08-13 DIAGNOSIS — Z7984 Long term (current) use of oral hypoglycemic drugs: Secondary | ICD-10-CM | POA: Insufficient documentation

## 2023-08-13 DIAGNOSIS — E785 Hyperlipidemia, unspecified: Secondary | ICD-10-CM | POA: Diagnosis not present

## 2023-08-13 DIAGNOSIS — R531 Weakness: Secondary | ICD-10-CM | POA: Diagnosis present

## 2023-08-13 DIAGNOSIS — N189 Chronic kidney disease, unspecified: Secondary | ICD-10-CM | POA: Diagnosis not present

## 2023-08-13 DIAGNOSIS — Z85828 Personal history of other malignant neoplasm of skin: Secondary | ICD-10-CM | POA: Insufficient documentation

## 2023-08-13 DIAGNOSIS — I129 Hypertensive chronic kidney disease with stage 1 through stage 4 chronic kidney disease, or unspecified chronic kidney disease: Secondary | ICD-10-CM | POA: Diagnosis not present

## 2023-08-13 DIAGNOSIS — R2689 Other abnormalities of gait and mobility: Secondary | ICD-10-CM | POA: Diagnosis not present

## 2023-08-13 DIAGNOSIS — Z79899 Other long term (current) drug therapy: Secondary | ICD-10-CM | POA: Insufficient documentation

## 2023-08-13 DIAGNOSIS — E039 Hypothyroidism, unspecified: Secondary | ICD-10-CM | POA: Diagnosis present

## 2023-08-13 DIAGNOSIS — E119 Type 2 diabetes mellitus without complications: Secondary | ICD-10-CM

## 2023-08-13 DIAGNOSIS — I63511 Cerebral infarction due to unspecified occlusion or stenosis of right middle cerebral artery: Principal | ICD-10-CM | POA: Diagnosis present

## 2023-08-13 LAB — CBC WITH DIFFERENTIAL/PLATELET
Abs Immature Granulocytes: 0.03 10*3/uL (ref 0.00–0.07)
Basophils Absolute: 0.1 10*3/uL (ref 0.0–0.1)
Basophils Relative: 1 %
Eosinophils Absolute: 0.2 10*3/uL (ref 0.0–0.5)
Eosinophils Relative: 2 %
HCT: 32.5 % — ABNORMAL LOW (ref 36.0–46.0)
Hemoglobin: 10.8 g/dL — ABNORMAL LOW (ref 12.0–15.0)
Immature Granulocytes: 0 %
Lymphocytes Relative: 23 %
Lymphs Abs: 2.1 10*3/uL (ref 0.7–4.0)
MCH: 33.8 pg (ref 26.0–34.0)
MCHC: 33.2 g/dL (ref 30.0–36.0)
MCV: 101.6 fL — ABNORMAL HIGH (ref 80.0–100.0)
Monocytes Absolute: 0.8 10*3/uL (ref 0.1–1.0)
Monocytes Relative: 9 %
Neutro Abs: 6.2 10*3/uL (ref 1.7–7.7)
Neutrophils Relative %: 65 %
Platelets: 239 10*3/uL (ref 150–400)
RBC: 3.2 MIL/uL — ABNORMAL LOW (ref 3.87–5.11)
RDW: 12.4 % (ref 11.5–15.5)
WBC: 9.4 10*3/uL (ref 4.0–10.5)
nRBC: 0 % (ref 0.0–0.2)

## 2023-08-13 LAB — COMPREHENSIVE METABOLIC PANEL WITH GFR
ALT: 12 U/L (ref 0–44)
AST: 21 U/L (ref 15–41)
Albumin: 3.2 g/dL — ABNORMAL LOW (ref 3.5–5.0)
Alkaline Phosphatase: 41 U/L (ref 38–126)
Anion gap: 10 (ref 5–15)
BUN: 20 mg/dL (ref 8–23)
CO2: 22 mmol/L (ref 22–32)
Calcium: 9.6 mg/dL (ref 8.9–10.3)
Chloride: 110 mmol/L (ref 98–111)
Creatinine, Ser: 0.98 mg/dL (ref 0.44–1.00)
GFR, Estimated: 55 mL/min — ABNORMAL LOW (ref >60)
Glucose, Bld: 112 mg/dL — ABNORMAL HIGH (ref 70–99)
Potassium: 3.6 mmol/L (ref 3.5–5.1)
Sodium: 142 mmol/L (ref 135–145)
Total Bilirubin: 0.9 mg/dL (ref 0.0–1.2)
Total Protein: 6.1 g/dL — ABNORMAL LOW (ref 6.5–8.1)

## 2023-08-13 LAB — PROTIME-INR
INR: 1.2 (ref 0.8–1.2)
Prothrombin Time: 15.7 s — ABNORMAL HIGH (ref 11.4–15.2)

## 2023-08-13 NOTE — ED Provider Notes (Signed)
 11:42 PM Assumed care from Dr. Estell Harpin, please see their note for full history, physical and decision making until this point. In brief this is a 89 y.o. year old female who presented to the ED tonight with Fall    Rule out cva. Mri pending.    Mri viewed and interpreted by myself with a few areas of increased signal on DWI. Radiology noted multiple acute infarcts in R MCA area. On my evaluation, has mild lue and l grip strength weakness. Family member shows me a video of the patient not even able to use her left hand to help with folding a blanket. On further history taking it sounds like she was LKN around 0215 when she walked to the bathroom. She then fell in the bathroom and was weak since then and then further noted the hand/arm issue later in the day. Either way, minimal symptoms, not LVO and well outside the window for TNK. D/w Dr. Antionette Char for admission for cva workup. Will hold on neuro/cta at this time, can be done in the hospital.   CRITICAL CARE Performed by: Marily Memos Total critical care time: 35 minutes Critical care time was exclusive of separately billable procedures and treating other patients. Critical care was necessary to treat or prevent imminent or life-threatening deterioration. Critical care was time spent personally by me on the following activities: development of treatment plan with patient and/or surrogate as well as nursing, discussions with consultants, evaluation of patient's response to treatment, examination of patient, obtaining history from patient or surrogate, ordering and performing treatments and interventions, ordering and review of laboratory studies, ordering and review of radiographic studies, pulse oximetry and re-evaluation of patient's condition.   Labs, studies and imaging reviewed by myself and considered in medical decision making if ordered. Imaging interpreted by radiology.  Labs Reviewed  CBC WITH DIFFERENTIAL/PLATELET - Abnormal; Notable for the  following components:      Result Value   RBC 3.20 (*)    Hemoglobin 10.8 (*)    HCT 32.5 (*)    MCV 101.6 (*)    All other components within normal limits  COMPREHENSIVE METABOLIC PANEL WITH GFR - Abnormal; Notable for the following components:   Glucose, Bld 112 (*)    Total Protein 6.1 (*)    Albumin 3.2 (*)    GFR, Estimated 55 (*)    All other components within normal limits  PROTIME-INR - Abnormal; Notable for the following components:   Prothrombin Time 15.7 (*)    All other components within normal limits    MR BRAIN WO CONTRAST    (Results Pending)    No follow-ups on file.    Yvette Loveless, Barbara Cower, MD 08/14/23 [phone]

## 2023-08-13 NOTE — ED Triage Notes (Signed)
 Pov from home with family. Says she fell in the bathroom. "Just lost my balance"  fell around 230 this am.  No LOC did not hit head. Only complains of her left elbow (small skin tear). But when talking to the family they said that her hand is now having trouble with dexterity and patient admitted that her left leg feels like she can't take a good step.   Since symptoms haven't resolved family is worried that she might have had a stroke vs hit a nerve on her elbow  No blood thinners

## 2023-08-13 NOTE — ED Provider Notes (Signed)
 [place] EMERGENCY DEPARTMENT AT [facility] Provider Note   CSN: [id number] Arrival date & time: 08/13/23  2129     History    Sophia [name] is a 89 y.o. female.  Patient has a history of diabetes and hypertension.  She was last seen normal around 1030 last night.  She fell last night around 1130.  Today she seems to have some weakness in her left arm and her left leg.  No other symptoms  The history is provided by the patient, a relative and medical records. No language interpreter was used.  Fall This is a new problem. The current episode started 12 to 24 hours ago. The problem occurs rarely. The problem has been resolved. Pertinent negatives include no chest pain, no abdominal pain and no headaches. Nothing aggravates the symptoms. Nothing relieves the symptoms.       Home Medications Prior to Admission medications   Medication Sig Start Date End Date Taking? Authorizing Provider  amLODipine (NORVASC) 5 MG tablet Take 1 tablet (5 mg total) by mouth daily. 07/20/23   Mechele Claude, MD  blood glucose meter kit and supplies KIT Use up to 4x daily prn 08/29/19   Mechele Claude, MD  Calcium-Magnesium 500-250 MG TABS Take 1 tablet by mouth daily with supper. 06/04/15   Mechele Claude, MD  carvedilol (COREG) 25 MG tablet Take 1 tablet (25 mg total) by mouth 2 (two) times daily with a meal. 07/20/23   Mechele Claude, MD  Cholecalciferol (DIALYVITE VITAMIN D 5000) 125 MCG (5000 UT) capsule Take 5,000 Units by mouth daily.    [provider]  donepezil (ARICEPT) 5 MG tablet Take 1 tablet (5 mg total) by mouth at bedtime. 07/20/23   Mechele Claude, MD  ferrous sulfate 325 (65 FE) MG tablet Take 1 tablet (325 mg total) by mouth daily with breakfast. 08/29/19   Mechele Claude, MD  ibuprofen (ADVIL) 200 MG tablet Take 400 mg by mouth every 6 (six) hours as needed for headache or moderate pain.    [provider]  levothyroxine (SYNTHROID) 100 MCG tablet TAKE 1  TABLET BY MOUTH ONCE DAILY. NEEDS LABWORK. 12/23/22   Mechele Claude, MD  Magnesium 250 MG TABS Take 250 mg by mouth daily.    [provider]  metFORMIN (GLUCOPHAGE-XR) 500 MG 24 hr tablet Take 1 tablet (500 mg total) by mouth daily with breakfast. 07/20/23   Mechele Claude, MD  olmesartan (BENICAR) 40 MG tablet Take 1 tablet (40 mg total) by mouth daily. 07/20/23   Mechele Claude, MD  omeprazole (PRILOSEC) 40 MG capsule TAKE 1 CAPSULE ON AN EMPTY STOMACH 1 HOUR BEFORE BREAKFAST AND ANOTHER AT BEDTIME (2 HOURS AFTER YOUR LAST FOOD AND DRINK EXCEPT WATER) 07/20/23   Mechele Claude, MD  risedronate (ACTONEL) 150 MG tablet Take 1 tablet (150 mg total) by mouth every 30 (thirty) days. with water, on an empty stomach, take nothing by mouth and to not lie down for 30 minutes after each dose. 05/14/23   Mechele Claude, MD  simvastatin (ZOCOR) 40 MG tablet TAKE 1/2 (ONE-HALF) TABLET BY MOUTH ONCE DAILY AT  6  PM 07/20/23   Mechele Claude, MD  solifenacin (VESICARE) 10 MG tablet Take 1 tablet (10 mg total) by mouth daily. For urinary control 05/21/22   Mechele Claude, MD  vitamin B-12 (CYANOCOBALAMIN) 1000 MCG tablet Take 1,000 mcg by mouth daily.    [provider]      Allergies    Patient has no  known allergies.    Review of Systems   Review of Systems  Constitutional:  Negative for appetite change and fatigue.  HENT:  Negative for congestion, ear discharge and sinus pressure.   Eyes:  Negative for discharge.  Respiratory:  Negative for cough.   Cardiovascular:  Negative for chest pain.  Gastrointestinal:  Negative for abdominal pain and diarrhea.  Genitourinary:  Negative for frequency and hematuria.  Musculoskeletal:  Negative for back pain.       Family states she seems to be having problems walking and using her left arm  Skin:  Negative for rash.  Neurological:  Negative for seizures and headaches.  Psychiatric/Behavioral:  Negative for hallucinations.     Physical Exam Updated  Vital Signs BP (!) 166/59   Pulse 62   Temp 97.6 F (36.4 C) (Oral)   Resp 16   Ht 5\' 2"  (1.575 m)   Wt 55.8 kg   SpO2 97%   BMI 22.50 kg/m  Physical Exam Vitals and nursing note reviewed.  Constitutional:      Appearance: She is well-developed.  HENT:     Head: Normocephalic.     Nose: Nose normal.  Eyes:     General: No scleral icterus.    Conjunctiva/sclera: Conjunctivae normal.  Neck:     Thyroid: No thyromegaly.  Cardiovascular:     Rate and Rhythm: Normal rate and regular rhythm.     Heart sounds: No murmur heard.    No friction rub. No gallop.  Pulmonary:     Breath sounds: No stridor. No wheezing or rales.  Chest:     Chest wall: No tenderness.  Abdominal:     General: There is no distension.     Tenderness: There is no abdominal tenderness. There is no rebound.  Musculoskeletal:        General: Normal range of motion.     Cervical back: Neck supple.     Comments: Patient has decreased dexterity in her left hand and minimal weakness in the left leg  Lymphadenopathy:     Cervical: No cervical adenopathy.  Skin:    Findings: No erythema or rash.  Neurological:     Mental Status: She is alert and oriented to person, place, and time.     Motor: No abnormal muscle tone.     Coordination: Coordination normal.  Psychiatric:        Behavior: Behavior normal.     ED Results / Procedures / Treatments   Labs (all labs ordered are listed, but only abnormal results are displayed) Labs Reviewed  CBC WITH DIFFERENTIAL/PLATELET - Abnormal; Notable for the following components:      Result Value   RBC 3.20 (*)    Hemoglobin 10.8 (*)    HCT 32.5 (*)    MCV 101.6 (*)    All other components within normal limits  COMPREHENSIVE METABOLIC PANEL WITH GFR  PROTIME-INR    EKG None  Radiology No results found.  Procedures Procedures    Medications Ordered in ED Medications - No data to display  ED Course/ Medical Decision Making/ A&P  Patient with left  arm and left leg disability from most likely a stroke.  MRI pending {    CRITICAL CARE Performed by: Bethann Berkshire Total critical care time: 45 minutes Critical care time was exclusive of separately billable procedures and treating other patients. Critical care was necessary to treat or prevent imminent or life-threatening deterioration. Critical care was time spent personally by me on  the following activities: development of treatment plan with patient and/or surrogate as well as nursing, discussions with consultants, evaluation of patient's response to treatment, examination of patient, obtaining history from patient or surrogate, ordering and performing treatments and interventions, ordering and review of laboratory studies, ordering and review of radiographic studies, pulse oximetry and re-evaluation of patient's condition.                               Medical Decision Making Amount and/or Complexity of Data Reviewed Labs: ordered. Radiology: ordered. ECG/medicine tests: ordered.  Risk Decision regarding hospitalization.    MRI shows multiple strokes.  Patient will be admitted to medicine with neurology consult  Final Clinical Impression(s) / ED Diagnoses Final diagnoses:  None    Rx / DC Orders ED Discharge Orders     None         Bethann Berkshire, MD 08/15/23 1153

## 2023-08-13 NOTE — ED Notes (Signed)
 Pt back from MRI

## 2023-08-14 ENCOUNTER — Observation Stay (HOSPITAL_BASED_OUTPATIENT_CLINIC_OR_DEPARTMENT_OTHER)

## 2023-08-14 ENCOUNTER — Observation Stay (HOSPITAL_COMMUNITY)

## 2023-08-14 ENCOUNTER — Encounter (HOSPITAL_COMMUNITY): Payer: Self-pay | Admitting: Family Medicine

## 2023-08-14 DIAGNOSIS — E782 Mixed hyperlipidemia: Secondary | ICD-10-CM

## 2023-08-14 DIAGNOSIS — E038 Other specified hypothyroidism: Secondary | ICD-10-CM

## 2023-08-14 DIAGNOSIS — I10 Essential (primary) hypertension: Secondary | ICD-10-CM

## 2023-08-14 DIAGNOSIS — I63511 Cerebral infarction due to unspecified occlusion or stenosis of right middle cerebral artery: Principal | ICD-10-CM | POA: Diagnosis present

## 2023-08-14 DIAGNOSIS — E119 Type 2 diabetes mellitus without complications: Secondary | ICD-10-CM

## 2023-08-14 DIAGNOSIS — I6389 Other cerebral infarction: Secondary | ICD-10-CM

## 2023-08-14 LAB — ECHOCARDIOGRAM COMPLETE
AR max vel: 1.42 cm2
AV Area VTI: 1.79 cm2
AV Area mean vel: 1.72 cm2
AV Mean grad: 6 mmHg
AV Peak grad: 11.6 mmHg
Ao pk vel: 1.7 m/s
Area-P 1/2: 2.62 cm2
Height: 62 in
S' Lateral: 2.6 cm
Weight: 1968.27 [oz_av]

## 2023-08-14 LAB — LIPID PANEL
Cholesterol: 143 mg/dL (ref 0–200)
HDL: 50 mg/dL (ref >40)
LDL Cholesterol: 66 mg/dL (ref 0–99)
Total CHOL/HDL Ratio: 2.9 ratio
Triglycerides: 134 mg/dL (ref <150)
VLDL: 27 mg/dL (ref 0–40)

## 2023-08-14 LAB — CBC
HCT: 32.2 % — ABNORMAL LOW (ref 36.0–46.0)
Hemoglobin: 10.5 g/dL — ABNORMAL LOW (ref 12.0–15.0)
MCH: 33.3 pg (ref 26.0–34.0)
MCHC: 32.6 g/dL (ref 30.0–36.0)
MCV: 102.2 fL — ABNORMAL HIGH (ref 80.0–100.0)
Platelets: 228 10*3/uL (ref 150–400)
RBC: 3.15 MIL/uL — ABNORMAL LOW (ref 3.87–5.11)
RDW: 12.4 % (ref 11.5–15.5)
WBC: 8 10*3/uL (ref 4.0–10.5)
nRBC: 0 % (ref 0.0–0.2)

## 2023-08-14 LAB — BASIC METABOLIC PANEL WITH GFR
Anion gap: 8 (ref 5–15)
BUN: 18 mg/dL (ref 8–23)
CO2: 24 mmol/L (ref 22–32)
Calcium: 9.3 mg/dL (ref 8.9–10.3)
Chloride: 108 mmol/L (ref 98–111)
Creatinine, Ser: 0.86 mg/dL (ref 0.44–1.00)
GFR, Estimated: >60 mL/min (ref >60)
Glucose, Bld: 93 mg/dL (ref 70–99)
Potassium: 3.5 mmol/L (ref 3.5–5.1)
Sodium: 140 mmol/L (ref 135–145)

## 2023-08-14 LAB — HEMOGLOBIN A1C
Hgb A1c MFr Bld: 5.8 % — ABNORMAL HIGH (ref 4.8–5.6)
Mean Plasma Glucose: 119.76 mg/dL

## 2023-08-14 LAB — GLUCOSE, CAPILLARY
Glucose-Capillary: 90 mg/dL (ref 70–99)
Glucose-Capillary: 139 mg/dL — ABNORMAL HIGH (ref 70–99)

## 2023-08-14 MED ORDER — IOHEXOL 350 MG/ML SOLN
75.0000 mL | Freq: Once | INTRAVENOUS | Status: AC | PRN
  Administered 2023-08-14: 75 mL via INTRAVENOUS

## 2023-08-14 MED ORDER — ENOXAPARIN SODIUM 40 MG/0.4ML IJ SOSY
40.0000 mg | PREFILLED_SYRINGE | INTRAMUSCULAR | Status: DC
  Administered 2023-08-14: 40 mg via SUBCUTANEOUS
  Filled 2023-08-14: qty 0.4

## 2023-08-14 MED ORDER — DONEPEZIL HCL 5 MG PO TABS: 5.0000 mg | ORAL_TABLET | Freq: Every day | ORAL | Status: DC

## 2023-08-14 MED ORDER — ASPIRIN 81 MG PO TBEC: 81.0000 mg | DELAYED_RELEASE_TABLET | Freq: Every day | ORAL | Status: DC

## 2023-08-14 MED ORDER — ACETAMINOPHEN 650 MG RE SUPP: 650.0000 mg | RECTAL | Status: DC | PRN

## 2023-08-14 MED ORDER — LEVOTHYROXINE SODIUM 100 MCG PO TABS
100.0000 ug | ORAL_TABLET | Freq: Every day | ORAL | Status: DC
  Administered 2023-08-14: 100 ug via ORAL
  Filled 2023-08-14: qty 1

## 2023-08-14 MED ORDER — ASPIRIN 81 MG PO TBEC: 81.0000 mg | DELAYED_RELEASE_TABLET | Freq: Every day | ORAL | 12 refills | Status: DC

## 2023-08-14 MED ORDER — STROKE: EARLY STAGES OF RECOVERY BOOK: Freq: Once | Status: DC

## 2023-08-14 MED ORDER — ACETAMINOPHEN 160 MG/5ML PO SOLN: 650.0000 mg | ORAL | Status: DC | PRN

## 2023-08-14 MED ORDER — INSULIN ASPART 100 UNIT/ML IJ SOLN: 0.0000 [IU] | Freq: Every day | INTRAMUSCULAR | Status: DC

## 2023-08-14 MED ORDER — CLOPIDOGREL BISULFATE 75 MG PO TABS
75.0000 mg | ORAL_TABLET | Freq: Every day | ORAL | Status: DC
  Administered 2023-08-14: 75 mg via ORAL
  Filled 2023-08-14: qty 1

## 2023-08-14 MED ORDER — CLOPIDOGREL BISULFATE 75 MG PO TABS
75.0000 mg | ORAL_TABLET | Freq: Every day | ORAL | 0 refills | Status: DC
Start: 2023-08-15 — End: 2023-10-12

## 2023-08-14 MED ORDER — PANTOPRAZOLE SODIUM 40 MG PO TBEC
40.0000 mg | DELAYED_RELEASE_TABLET | Freq: Two times a day (BID) | ORAL | Status: DC
  Filled 2023-08-14: qty 1

## 2023-08-14 MED ORDER — ACETAMINOPHEN 325 MG PO TABS: 650.0000 mg | ORAL_TABLET | ORAL | Status: DC | PRN

## 2023-08-14 MED ORDER — INSULIN ASPART 100 UNIT/ML IJ SOLN: 0.0000 [IU] | Freq: Three times a day (TID) | INTRAMUSCULAR | Status: DC

## 2023-08-14 MED ORDER — SENNOSIDES-DOCUSATE SODIUM 8.6-50 MG PO TABS: 1.0000 | ORAL_TABLET | Freq: Every evening | ORAL | Status: DC | PRN

## 2023-08-14 MED ORDER — ASPIRIN 300 MG RE SUPP
300.0000 mg | Freq: Once | RECTAL | Status: AC
  Filled 2023-08-14: qty 1

## 2023-08-14 MED ORDER — ASPIRIN 325 MG PO TABS
325.0000 mg | ORAL_TABLET | Freq: Once | ORAL | Status: AC
  Administered 2023-08-14: 325 mg via ORAL
  Filled 2023-08-14: qty 1

## 2023-08-14 NOTE — H&P (Signed)
 History and Physical    Sophia [name] ZOX:[id number] DOB: April 06, 1932 DOA: 08/13/2023  PCP: Mechele Claude, MD   Patient coming from: Home   Chief Complaint: Difficulty using left arm, dragging left leg   HPI: Sophia [name] is a pleasant right-hand-dominant 89 y.o. female with medical history significant for hypertension, hyperlipidemia, hypothyroidism, type 2 diabetes mellitus, and remote history of bleeding peptic ulcer who presents with difficulty using her left arm and left leg weakness.  Patient was in her usual state when she went to bed the night of 08/12/2023 but fell at approximately 2:30 AM the following morning when she got up to use the bathroom.  Since then, she has had difficulty using her left hand, and has been dropping things that she thought she was holding onto.  Family was also concerned that she appeared to be dragging her left leg slightly while ambulating.   She had a abrasion on her left elbow from the fall but denies any other injury, denies headache, denies change in vision, and denies any numbness or tingling.  She has not had any recent chest pain or palpitations.  ED Course: Upon arrival to the ED, patient is found to be afebrile and saturating well on room air with normal heart rate and stable blood pressure.  Labs are most notable for creatinine 0.98, normal WBC, and hemoglobin 10.8.  MRI brain is concerning for multiple acute infarcts in the right MCA distribution.  Review of Systems:  All other systems reviewed and apart from HPI, are negative.  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   Chronic kidney disease    Diabetes mellitus without complication (HCC)    GERD (gastroesophageal reflux disease)    Hyperlipidemia    Hypertension    Hypothyroidism     Past Surgical History:  Procedure Laterality Date   ADJACENT TISSUE TRANSFER/TISSUE REARRANGEMENT N/A 05/21/2020   Procedure: closure with adjacent  tissue transfer;  Surgeon: Allena Napoleon, MD;  Location: [facility] OR;  Service: Plastics;  Laterality: N/A;   BASAL CELL CARCINOMA EXCISION N/A 05/21/2020   Procedure: excision of scalp squamous cell carcinoma with frozen sections;  Surgeon: Allena Napoleon, MD;  Location: [facility] OR;  Service: Plastics;  Laterality: N/A;  1.5 hours total   CHOLECYSTECTOMY     COLONOSCOPY     ESOPHAGOGASTRODUODENOSCOPY N/A 04/29/2015   Procedure: ESOPHAGOGASTRODUODENOSCOPY (EGD);  Surgeon: Vida Rigger, MD;  Location: [facility] ENDOSCOPY;  Service: Endoscopy;  Laterality: N/A;   EYE SURGERY Right 01/19/2019   blocked tear duct    SIGMOIDOSCOPY      Social History:   reports that she has never smoked. She has never used smokeless tobacco. She reports that she does not drink alcohol and does not use drugs.  No Known Allergies  Family History  Problem Relation Age of Onset   Heart disease Father    Colon cancer Sister    Heart disease Brother    Heart disease Sister      Prior to Admission medications   Medication Sig Start Date End Date Taking? Authorizing Provider  amLODipine (NORVASC) 5 MG tablet Take 1 tablet (5 mg total) by mouth daily. 07/20/23   Mechele Claude, MD  blood glucose meter kit and supplies KIT Use up to 4x daily prn 08/29/19   Mechele Claude, MD  Calcium-Magnesium 500-250 MG TABS Take 1 tablet by mouth daily with supper. 06/04/15   Mechele Claude, MD  carvedilol (COREG) 25  MG tablet Take 1 tablet (25 mg total) by mouth 2 (two) times daily with a meal. 07/20/23   Mechele Claude, MD  Cholecalciferol (DIALYVITE VITAMIN D 5000) 125 MCG (5000 UT) capsule Take 5,000 Units by mouth daily.    [provider]  donepezil (ARICEPT) 5 MG tablet Take 1 tablet (5 mg total) by mouth at bedtime. 07/20/23   Mechele Claude, MD  ferrous sulfate 325 (65 FE) MG tablet Take 1 tablet (325 mg total) by mouth daily with breakfast. 08/29/19   Mechele Claude, MD  ibuprofen (ADVIL) 200 MG tablet Take 400 mg by mouth every 6 (six)  hours as needed for headache or moderate pain.    [provider]  levothyroxine (SYNTHROID) 100 MCG tablet TAKE 1 TABLET BY MOUTH ONCE DAILY. NEEDS LABWORK. 12/23/22   Mechele Claude, MD  Magnesium 250 MG TABS Take 250 mg by mouth daily.    [provider]  metFORMIN (GLUCOPHAGE-XR) 500 MG 24 hr tablet Take 1 tablet (500 mg total) by mouth daily with breakfast. 07/20/23   Mechele Claude, MD  olmesartan (BENICAR) 40 MG tablet Take 1 tablet (40 mg total) by mouth daily. 07/20/23   Mechele Claude, MD  omeprazole (PRILOSEC) 40 MG capsule TAKE 1 CAPSULE ON AN EMPTY STOMACH 1 HOUR BEFORE BREAKFAST AND ANOTHER AT BEDTIME (2 HOURS AFTER YOUR LAST FOOD AND DRINK EXCEPT WATER) 07/20/23   Mechele Claude, MD  risedronate (ACTONEL) 150 MG tablet Take 1 tablet (150 mg total) by mouth every 30 (thirty) days. with water, on an empty stomach, take nothing by mouth and to not lie down for 30 minutes after each dose. 05/14/23   Mechele Claude, MD  simvastatin (ZOCOR) 40 MG tablet TAKE 1/2 (ONE-HALF) TABLET BY MOUTH ONCE DAILY AT  6  PM 07/20/23   Mechele Claude, MD  solifenacin (VESICARE) 10 MG tablet Take 1 tablet (10 mg total) by mouth daily. For urinary control 05/21/22   Mechele Claude, MD  vitamin B-12 (CYANOCOBALAMIN) 1000 MCG tablet Take 1,000 mcg by mouth daily.    [provider]    Physical Exam: Vitals:   08/13/23 2147 08/13/23 2200 08/13/23 2215 08/13/23 2245  BP:  (!) 166/59 (!) 153/62 (!) 142/100  Pulse:  62 62 64  Resp:  16  (!) 21  Temp:      TempSrc:      SpO2:  97% 97% 95%  Weight: 55.8 kg     Height: 5\' 2"  (1.575 m)       Constitutional: NAD, calm  Eyes: PERTLA, lids and conjunctivae normal ENMT: Mucous membranes are moist. Posterior pharynx clear of any exudate or lesions.   Neck: supple, no masses  Respiratory: no wheezing, no crackles. No accessory muscle use.  Cardiovascular: S1 & S2 heard, regular rate and rhythm. No extremity edema.   Abdomen: No tenderness, soft.  Bowel sounds active.  Musculoskeletal: no clubbing / cyanosis. No joint deformity upper and lower extremities.   Skin: no significant rashes, lesions, ulcers. Warm, dry, well-perfused. Neurologic: CN 2-12 grossly intact. Sensation to light touch intact. Strength 5/5 in all 4 limbs. Loss of coordination involving LUE. Alert and oriented.  Psychiatric: Pleasant. Cooperative.    Labs and Imaging on Admission: I have personally reviewed following labs and imaging studies  CBC: Recent Labs  Lab 08/13/23 2248  WBC 9.4  NEUTROABS 6.2  HGB 10.8*  HCT 32.5*  MCV 101.6*  PLT 239   Basic Metabolic Panel: Recent Labs  Lab 08/13/23 2248  NA 142  K 3.6  CL 110  CO2 22  GLUCOSE 112*  BUN 20  CREATININE 0.98  CALCIUM 9.6   GFR: Estimated Creatinine Clearance: 30.8 mL/min (by C-G formula based on SCr of 0.98 mg/dL). Liver Function Tests: Recent Labs  Lab 08/13/23 2248  AST 21  ALT 12  ALKPHOS 41  BILITOT 0.9  PROT 6.1*  ALBUMIN 3.2*   No results for input(s): "LIPASE", "AMYLASE" in the last 168 hours. No results for input(s): "AMMONIA" in the last 168 hours. Coagulation Profile: Recent Labs  Lab 08/13/23 2248  INR 1.2   Cardiac Enzymes: No results for input(s): "CKTOTAL", "CKMB", "CKMBINDEX", "TROPONINI" in the last 168 hours. BNP (last 3 results) No results for input(s): "PROBNP" in the last 8760 hours. HbA1C: No results for input(s): "HGBA1C" in the last 72 hours. CBG: No results for input(s): "GLUCAP" in the last 168 hours. Lipid Profile: No results for input(s): "CHOL", "HDL", "LDLCALC", "TRIG", "CHOLHDL", "LDLDIRECT" in the last 72 hours. Thyroid Function Tests: No results for input(s): "TSH", "T4TOTAL", "FREET4", "T3FREE", "THYROIDAB" in the last 72 hours. Anemia Panel: No results for input(s): "VITAMINB12", "FOLATE", "FERRITIN", "TIBC", "IRON", "RETICCTPCT" in the last 72 hours. Urine analysis:    Component Value Date/Time   COLORURINE YELLOW 06/01/2016  0916   APPEARANCEUR Hazy (A) 05/30/2019 0000   LABSPEC 1.019 06/01/2016 0916   PHURINE 5.0 06/01/2016 0916   GLUCOSEU Negative 05/30/2019 0000   HGBUR NEGATIVE 06/01/2016 0916   BILIRUBINUR Negative 05/30/2019 0000   KETONESUR NEGATIVE 06/01/2016 0916   PROTEINUR 3+ (A) 05/30/2019 0000   PROTEINUR NEGATIVE 06/01/2016 0916   UROBILINOGEN negative 04/26/2015 1530   NITRITE Negative 05/30/2019 0000   NITRITE NEGATIVE 06/01/2016 0916   LEUKOCYTESUR 2+ (A) 05/30/2019 0000   Sepsis Labs: @LABRCNTIP (procalcitonin:4,lacticidven:4) )No results found for this or any previous visit (from the past 240 hours).   Radiological Exams on Admission: MR BRAIN WO CONTRAST Result Date: 08/14/2023 CLINICAL DATA:  Neuro deficit, acute, stroke suspected EXAM: MRI HEAD WITHOUT CONTRAST TECHNIQUE: Multiplanar, multiecho pulse sequences of the brain and surrounding structures were obtained without intravenous contrast. COMPARISON:  None Available. FINDINGS: Brain: Multiple acute infarcts in the right MCA distribution including the posterior right insula and overlying frontal and parietal lobes. Associated edema without substantial mass effect. No evidence of acute hemorrhage, mass lesion, or midline shift. No hydrocephalus. Additional patchy T2/FLAIR hyperintensities within the white matter are compatible with mild for age chronic microvascular ischemic disease. Vascular: Major arterial flow voids are maintained at the skull base. Skull and upper cervical spine: Normal marrow signal. Sinuses/Orbits: Mild paranasal sinus mucosal thickening. No acute orbital findings. Other: Small left mastoid effusion. IMPRESSION: Multiple acute infarcts in the right MCA distribution including the posterior right insula and overlying frontal and parietal lobes Electronically Signed   By: Feliberto Harts M.D.   On: 08/14/2023 00:04    EKG: Independently reviewed. Sinus rhythm.   Assessment/Plan   1. Acute ischemic right MCA stroke   - Keep NPO pending swallow screen, continue cardiac monitoring and frequent neuro checks, check CTA head & neck, echocardiogram, lipids, and A1c, consult PT/OT/SLP, continue Zocor, start DAPT    2. Hypertension  - Permit HTN in acute-phase of ischemic CVA    3. Type II DM  - Update A1c, check CBGs, use low-intensity SSI for now    4. Hyperlipidemia  - Zocor   5. Hypothyroidism  - Synthroid    6. Hx of PUD  - PPI  DVT prophylaxis: Lovenox  Code Status: Full, discussed with patient and her family in ED  Level of Care: Level of care: Telemetry Family Communication: Daughter and granddaughter at bedside  Disposition Plan:  Patient is from: Home  Anticipated d/c is to: TBD Anticipated d/c date is: 08/15/23  Patient currently: Pending CVA workup  Consults called: Routine neurology consultation requested via Epic order   Admission status: Observation    Briscoe Deutscher, MD [facility]  08/14/2023, 1:45 AM

## 2023-08-14 NOTE — Care Management Obs Status (Signed)
 MEDICARE OBSERVATION STATUS NOTIFICATION   Patient Details  Name: Sophia [name] MRN: [id number] Date of Birth: Jun 20, 1931   Medicare Observation Status Notification Given:   Yes    Anabel Halon, RN 08/14/2023, 9:20 AM

## 2023-08-14 NOTE — Care Management Important Message (Addendum)
 Important Message  Patient Details  Name: Aubria Vanecek MRN: [id number] Date of Birth: 11/30/1931   Important Message Given:   Yes    Anabel Halon, RN 08/14/2023, 9:03 AM

## 2023-08-14 NOTE — Progress Notes (Signed)
   08/14/23 1344  TOC Assessment  TOC screening is complete Yes  Once discharged, how will the patient get to their discharge location? Self/Private Vehicle  Expected Discharge Plan Home w Home Health Services  Final next level of care Home w Home Health Services  Barriers to Discharge No Barriers Identified  CMS Medicare.gov Compare Post Acute Care list provided to: Patient  Post Acute Care [facility]  Living arrangements for the past 2 months Single Family Home  DME Agency Other - Comment ([facility])  HH Arranged PT  Do you feel safe going back to the place where you live? Y  Permission sought to share information with  Case Manager  Permission granted to share information with  Yes, Release of Information Signed  Appearance: Appears stated age  Attitude/Demeanor/Rapport Engaged  Affect (typically observed) Stable  Orientation:  Oriented to Self;Oriented to Place;Oriented to  Time;Oriented to Situation

## 2023-08-14 NOTE — Care Management Obs Status (Deleted)
 MEDICARE OBSERVATION STATUS NOTIFICATION   Patient Details  Name: Sophia [name] MRN: [id number] Date of Birth: 1931-08-04   Medicare Observation Status Notification Given:       Anabel Halon, RN 08/14/2023, 9:05 AM

## 2023-08-14 NOTE — Plan of Care (Signed)

## 2023-08-14 NOTE — Plan of Care (Signed)
 Neurology plan of care  I was contacted by Dr. Jarvis Newcomer for guidance in stroke workup for this patient who is admitted to [facility] over the weekend.  She is 89 years old and has a medical history significant for hypertension, hyperlipidemia, hypothyroidism, type 2 diabetes, remote history of bleeding peptic ulcer who presented with left arm and left leg weakness.  Last known well was the night of March 27.  She got up to use the bathroom at approximately 2:30 AM that night and since then she has had difficulty using her left hand and is dropping things when she tries to hold onto them.  Irving Burton also noted that she was dragging her left leg slightly while ambulating. MRI demonstrated multiple acute infarcts in R MCA distribution.  Stroke workup this admission:  MRI brain wo Multiple acute infarcts in the right MCA distribution including the posterior right insula and overlying frontal and parietal lobes  CTA H&N 1. No emergent large vessel occlusion. 2. Severe distal right M2 MCA stenosis, which correlates with the area of infarcts seen on low the recent MRI.  All CNS imaging only reviewed and I agree with the above interpretations.  TTE pending  Stroke Labs     Component Value Date/Time   CHOL 143 08/14/2023 0408   CHOL 179 07/20/2023 1409   CHOL 181 10/24/2012 1139   TRIG 134 08/14/2023 0408   TRIG 99 09/19/2013 1253   TRIG 123 10/24/2012 1139   HDL 50 08/14/2023 0408   HDL 56 07/20/2023 1409   HDL 66 09/19/2013 1253   HDL 64 10/24/2012 1139   CHOLHDL 2.9 08/14/2023 0408   VLDL 27 08/14/2023 0408   LDLCALC 66 08/14/2023 0408   LDLCALC 86 07/20/2023 1409   LDLCALC 72 09/19/2013 1253   LDLCALC 92 10/24/2012 1139   LABVLDL 37 07/20/2023 1409    Lab Results  Component Value Date/Time   HGBA1C 5.8 (H) 08/14/2023 04:08 AM   HGBA1C 5.8 (H) 07/20/2023 02:08 PM    Stroke workup is completed with the exception of TTE which has been done but not yet read.  Etiology of stroke is  favored to be large vessel disease specifically severe distal right M2 MCA stenosis.  Recommendations:  - Allow permissive HTN up to 220/120 for additional 24 hrs; after that slowly normalize goal to normotension; avoid hypotension - ASA 81mg  daily + plavix 75mg  daily x90 days f/b ASA 81mg  daily monotherapy after that (90 days 2/2 severe intracranial stenosis) - Continue simvastatin 40mg  daily; LDL is at goal - F/u TTE - q4 hr neuro checks - STAT head CT for any change in neuro exam - Tele - PT/OT/SLP - Stroke education - Amb referral to neurology upon discharge   Bing Neighbors, MD [facility] [phone]  If 7pm- 7am, please page neurology on call as listed in AMION.

## 2023-08-14 NOTE — Plan of Care (Signed)
  Problem: Acute Rehab PT Goals(only PT should resolve) Goal: Pt Will Go Supine/Side To Sit Outcome: Progressing Flowsheets (Taken 08/14/2023 1036) Pt will go Supine/Side to Sit: Independently Goal: Patient Will Transfer Sit To/From Stand Outcome: Progressing Flowsheets (Taken 08/14/2023 1036) Patient will transfer sit to/from stand: Independently Goal: Pt Will Transfer Bed To Chair/Chair To Bed Outcome: Progressing Flowsheets (Taken 08/14/2023 1036) Pt will Transfer Bed to Chair/Chair to Bed: Independently Goal: Pt Will Ambulate Outcome: Progressing Flowsheets (Taken 08/14/2023 1036) Pt will Ambulate:  100 feet  with modified independence  with supervision   Luz Lex, PT, DPT [facility] Office: [phone] 10:37 AM, 08/14/23

## 2023-08-14 NOTE — Progress Notes (Signed)
   08/14/23 1342  TOC Brief Assessment  Insurance and Status Reviewed  Patient has primary care physician Yes  Home environment has been reviewed Single family home  Prior level of function: Independent  Prior/Current Home Services No current home services  Readmission risk has been reviewed Yes  Transition of care needs transition of care needs identified, TOC will continue to follow   Patient will be discharged with Home Health [facility] PT. Hilbert Corrigan notified.

## 2023-08-14 NOTE — Discharge Summary (Signed)
 Physician Discharge Summary   Patient: Sophia [name] MRN: [id number] DOB: [birth year]  Admit date:     08/13/2023  Discharge date: 08/14/23  Discharge Physician: Tyrone Nine   PCP: Mechele Claude, MD   Recommendations at discharge:  Follow up with PCP in 1-2 weeks. Note the addition of DAPT for stroke, continuation of simvastatin.  Due to history of PUD and starting DAPT, PPI BID is continued and ibuprofen discontinued.  Suggest recheck BMP at follow up. HbA1c 5.8% and CrCl is marginal, consider stopping metformin.  Follow up with neurology (referral made at discharge).  Discharge Diagnoses: Principal Problem:   Acute ischemic right MCA stroke (HCC) Active Problems:   Hyperlipidemia   Essential (primary) hypertension   Diabetes mellitus without complication (HCC)   Thyroid activity decreased  Hospital Course: HPI: Sophia [name] is a pleasant right-hand-dominant 89 y.o. female with medical history significant for hypertension, hyperlipidemia, hypothyroidism, type 2 diabetes mellitus, and remote history of bleeding peptic ulcer who presents with difficulty using her left arm and left leg weakness.   Patient was in her usual state when she went to bed the night of 08/12/2023 but fell at approximately 2:30 AM the following morning when she got up to use the bathroom.  Since then, she has had difficulty using her left hand, and has been dropping things that she thought she was holding onto.  Family was also concerned that she appeared to be dragging her left leg slightly while ambulating.    She had a abrasion on her left elbow from the fall but denies any other injury, denies headache, denies change in vision, and denies any numbness or tingling.  She has not had any recent chest pain or palpitations.   ED Course: Upon arrival to the ED, patient is found to be afebrile and saturating well on room air with normal heart rate and stable blood pressure.  Labs are most notable for  creatinine 0.98, normal WBC, and hemoglobin 10.8.  MRI brain is concerning for multiple acute infarcts in the right MCA distribution.  Subsequent CTA showed severe distal M2 branch stenosis. Neurology was consulted, recommended 90 days of DAPT followed by aspirin monotherapy, continuing statin (LDL at goal), and neurology follow up. PT/OT recommend home health which is ordered at discharge.   Assessment and Plan: Acute ischemic right MCA stroke, M2 stenosis: - HH PT and HH OT ordered. Neurology remotely consulted, recommending DAPT x3 months, then aspirin, continue statin and DM Tx, f/u with neurology.  - Echo without CES, grossly normal IAS and LVSF.   Hypertension  - Restart home meds > 48 hours.    T2DM: HbA1c 5.8%. Given marginal CrCl, metformin could be stopped, defer to PCP.     Hyperlipidemia  - LDL at goal at 66, continue simvastatin    Hypothyroidism  - Continue synthroid     Hx of PUD: In 2016. No recent symptoms - Continue PPI. DC'ed ibuprofen on med list.   Consultants: Neurology, Dr. Selina Cooley, remotely Procedures performed: Echo  Disposition: Home Diet recommendation:  Cardiac and Carb modified diet DISCHARGE MEDICATION: Allergies as of 08/14/2023   No Known Allergies      Medication List     STOP taking these medications    ibuprofen 200 MG tablet Commonly known as: ADVIL       TAKE these medications    amLODipine 5 MG tablet Commonly known as: NORVASC Take 1 tablet (5 mg total) by mouth daily.   aspirin EC 81 MG tablet  Take 1 tablet (81 mg total) by mouth daily. Swallow whole.   blood glucose meter kit and supplies Kit Use up to 4x daily prn   Calcium-Magnesium 500-250 MG Tabs Take 1 tablet by mouth daily with supper.   carvedilol 25 MG tablet Commonly known as: COREG Take 1 tablet (25 mg total) by mouth 2 (two) times daily with a meal.   clopidogrel 75 MG tablet Commonly known as: PLAVIX Take 1 tablet (75 mg total) by mouth daily. Start  taking on: August 15, 2023   cyanocobalamin 1000 MCG tablet Commonly known as: VITAMIN B12 Take 1,000 mcg by mouth daily.   Dialyvite Vitamin D 5000 125 MCG (5000 UT) capsule Generic drug: Cholecalciferol Take 5,000 Units by mouth daily.   donepezil 5 MG tablet Commonly known as: Aricept Take 1 tablet (5 mg total) by mouth at bedtime.   ferrous sulfate 325 (65 FE) MG tablet Take 1 tablet (325 mg total) by mouth daily with breakfast.   levothyroxine 100 MCG tablet Commonly known as: SYNTHROID TAKE 1 TABLET BY MOUTH ONCE DAILY. NEEDS LABWORK.   Magnesium 250 MG Tabs Take 250 mg by mouth daily.   metFORMIN 500 MG 24 hr tablet Commonly known as: GLUCOPHAGE-XR Take 1 tablet (500 mg total) by mouth daily with breakfast.   olmesartan 40 MG tablet Commonly known as: BENICAR Take 1 tablet (40 mg total) by mouth daily.   omeprazole 40 MG capsule Commonly known as: PRILOSEC TAKE 1 CAPSULE ON AN EMPTY STOMACH 1 HOUR BEFORE BREAKFAST AND ANOTHER AT BEDTIME (2 HOURS AFTER YOUR LAST FOOD AND DRINK EXCEPT WATER)   risedronate 150 MG tablet Commonly known as: Actonel Take 1 tablet (150 mg total) by mouth every 30 (thirty) days. with water, on an empty stomach, take nothing by mouth and to not lie down for 30 minutes after each dose.   simvastatin 40 MG tablet Commonly known as: ZOCOR TAKE 1/2 (ONE-HALF) TABLET BY MOUTH ONCE DAILY AT  6  PM   solifenacin 10 MG tablet Commonly known as: VESICARE Take 1 tablet (10 mg total) by mouth daily. For urinary control        Follow-up Information     Mechele Claude, MD Follow up.   Specialty: Family Medicine Contact information: [address] [place] [address] [phone]         [facility] Follow up.   Specialty: Neurology Why: Call if you aren't contacted (referral was made on Saturday) to schedule an appointment Contact information: [address] [place]  [address] [phone]               Discharge Exam: Ceasar Mons Weights   08/13/23 2147  Weight: 55.8 kg  BP (!) 144/55 (BP Location: Right Arm)   Pulse 63   Temp 98.7 F (37.1 C) (Oral)   Resp 18   Ht 5\' 2"  (1.575 m)   Wt 55.8 kg   SpO2 98%   BMI 22.50 kg/m   Well-appearing, lively, daughter and granddaughter at bedside Clear, nonlabored Soft systolic murmur, RRR, no edema Alert, interactive, No deficit in grip strength or EHL bilaterally, though impaired coordination LUE and +pronator drift on left. Speech at baseline.  Condition at discharge: stable  The results of significant diagnostics from this hospitalization (including imaging, microbiology, ancillary and laboratory) are listed below for reference.   Imaging Studies: ECHOCARDIOGRAM COMPLETE Result Date: 08/14/2023    ECHOCARDIOGRAM REPORT   Patient Name:   KALESE  Pauls Date of Exam: 08/14/2023 Medical Rec #:  [id number]          Height:       62.0 in Accession #:    [id number]         Weight:       123.0 lb Date of Birth:  09-Oct-1931         BSA:          1.555 m Patient Age:    89 years           BP:           141/72 mmHg Patient Gender: F                  HR:           63 bpm. [facility] Procedure: 2D Echo, Cardiac Doppler and Color Doppler (Both Spectral and Color            Flow Doppler were utilized during procedure). Indications:    Stroke  History:        Patient has no prior history of Echocardiogram examinations.                 Risk Factors:Hypertension and Diabetes.  Sonographer:    Darlys Gales Referring Phys: [id number] TIMOTHY S OPYD  Sonographer Comments: Technically difficult study due to poor echo windows. IMPRESSIONS  1. Left ventricular ejection fraction, by estimation, is 65 to 70%. The left ventricle has normal function. Left ventricular endocardial border not optimally defined to evaluate regional wall motion. Left ventricular diastolic parameters are consistent with Grade II diastolic  dysfunction (pseudonormalization).  2. Right ventricular systolic function is normal. The right ventricular size is normal. Tricuspid regurgitation signal is inadequate for assessing PA pressure.  3. Left atrial size was mildly dilated.  4. The mitral valve is degenerative. Trivial mitral valve regurgitation. No evidence of mitral stenosis.  5. The aortic valve is grossly normal. There is mild calcification of the aortic valve. Aortic valve regurgitation is not visualized. Aortic valve sclerosis/calcification is present, without any evidence of aortic stenosis.  6. IVC not well visualized.. The inferior vena cava is normal in size with greater than 50% respiratory variability, suggesting right atrial pressure of 3 mmHg. FINDINGS  Left Ventricle: Left ventricular ejection fraction, by estimation, is 65 to 70%. The left ventricle has normal function. Left ventricular endocardial border not optimally defined to evaluate regional wall motion. The left ventricular internal cavity size was normal in size. There is no left ventricular hypertrophy. Left ventricular diastolic parameters are consistent with Grade II diastolic dysfunction (pseudonormalization). Right Ventricle: The right ventricular size is normal. No increase in right ventricular wall thickness. Right ventricular systolic function is normal. Tricuspid regurgitation signal is inadequate for assessing PA pressure. The tricuspid regurgitant velocity is 1.07 m/s, and with an assumed right atrial pressure of 3 mmHg, the estimated right ventricular systolic pressure is 7.6 mmHg. Left Atrium: Left atrial size was mildly dilated. Right Atrium: Right atrial size was normal in size. Pericardium: There is no evidence of pericardial effusion. Mitral Valve: The mitral valve is degenerative in appearance. Mild mitral annular calcification. Trivial mitral valve regurgitation. No evidence of mitral valve stenosis. Tricuspid Valve: The tricuspid valve is normal in structure.  Tricuspid valve regurgitation is trivial. No evidence of tricuspid stenosis. Aortic Valve: The aortic valve is grossly normal. There is mild calcification of the aortic valve. Aortic valve regurgitation is not visualized. Aortic valve sclerosis/calcification is  present, without any evidence of aortic stenosis. Aortic valve mean gradient measures 6.0 mmHg. Aortic valve peak gradient measures 11.6 mmHg. Aortic valve area, by VTI measures 1.79 cm. Pulmonic Valve: The pulmonic valve was not well visualized. Pulmonic valve regurgitation is not visualized. No evidence of pulmonic stenosis. Aorta: The aortic root was not well visualized and the ascending aorta was not well visualized. Venous: IVC not well visualized. The inferior vena cava was not well visualized. The inferior vena cava is normal in size with greater than 50% respiratory variability, suggesting right atrial pressure of 3 mmHg. IAS/Shunts: The atrial septum is grossly normal.  LEFT VENTRICLE PLAX 2D LVIDd:         4.10 cm   Diastology LVIDs:         2.60 cm   LV e' medial:    5.44 cm/s LV PW:         0.70 cm   LV E/e' medial:  17.9 LV IVS:        0.90 cm   LV e' lateral:   5.11 cm/s LVOT diam:     1.70 cm   LV E/e' lateral: 19.0 LV SV:         66 LV SV Index:   42 LVOT Area:     2.27 cm  RIGHT VENTRICLE RV S prime:     12.90 cm/s TAPSE (M-mode): 2.6 cm LEFT ATRIUM             Index        RIGHT ATRIUM           Index LA Vol (A2C):   51.0 ml 32.80 ml/m  RA Area:     13.50 cm LA Vol (A4C):   69.8 ml 44.89 ml/m  RA Volume:   30.70 ml  19.74 ml/m LA Biplane Vol: 60.5 ml 38.91 ml/m  AORTIC VALVE AV Area (Vmax):    1.42 cm AV Area (Vmean):   1.72 cm AV Area (VTI):     1.79 cm AV Vmax:           170.00 cm/s AV Vmean:          118.000 cm/s AV VTI:            0.369 m AV Peak Grad:      11.6 mmHg AV Mean Grad:      6.0 mmHg LVOT Vmax:         106.00 cm/s LVOT Vmean:        89.200 cm/s LVOT VTI:          0.291 m LVOT/AV VTI ratio: 0.79 MITRAL VALVE                TRICUSPID VALVE MV Area (PHT): 2.62 cm    TR Peak grad:   4.6 mmHg MV Decel Time: 290 msec    TR Vmax:        107.00 cm/s MV E velocity: 97.30 cm/s MV A velocity: 95.10 cm/s  SHUNTS MV E/A ratio:  1.02        Systemic VTI:  0.29 m                            Systemic Diam: 1.70 cm Weston Brass MD Electronically signed by Weston Brass MD Signature Date/Time: 08/14/2023/12:43:19 PM    Final    CT ANGIO HEAD NECK W WO CM Result Date: 08/14/2023 CLINICAL DATA:  Stroke/TIA, determine embolic source EXAM: CT  ANGIOGRAPHY HEAD AND NECK WITH AND WITHOUT CONTRAST TECHNIQUE: Multidetector CT imaging of the head and neck was performed using the standard protocol during bolus administration of intravenous contrast. Multiplanar CT image reconstructions and MIPs were obtained to evaluate the vascular anatomy. Carotid stenosis measurements (when applicable) are obtained utilizing NASCET criteria, using the distal internal carotid diameter as the denominator. RADIATION DOSE REDUCTION: This exam was performed according to the departmental dose-optimization program which includes automated exposure control, adjustment of the mA and/or kV according to patient size and/or use of iterative reconstruction technique. CONTRAST:  75mL OMNIPAQUE IOHEXOL 350 MG/ML SOLN COMPARISON:  MRI head August 13, 2023. FINDINGS: CTA NECK FINDINGS Aortic arch: Aortic atherosclerosis. Great vessel origins are patent. Right carotid system: No evidence of dissection, stenosis (50% or greater), or occlusion. Left carotid system: No evidence of dissection, stenosis (50% or greater), or occlusion. Vertebral arteries: Right dominant. No evidence of dissection, stenosis (50% or greater), or occlusion. Skeleton: No acute abnormality on limited assessment. Other neck: Negative. Upper chest: Visualized lung apices are clear. Review of the MIP images confirms the above findings CTA HEAD FINDINGS Anterior circulation: Bilateral intracranial ICAs, MCAs, and  ACAs are patent. Severe distal right M2 MCA stenosis (for example see series 9, image 180). Posterior circulation: Bilateral intradural vertebral arteries, basilar artery, and bilateral posterior cerebral arteries are patent without proximal hemodynamically significant stenosis. Venous sinuses: As permitted by contrast timing, patent. Review of the MIP images confirms the above findings IMPRESSION: 1. No emergent large vessel occlusion. 2. Severe distal right M2 MCA stenosis, which correlates with the area of infarcts seen on low the recent MRI. Electronically Signed   By: Feliberto Harts M.D.   On: 08/14/2023 02:43   MR BRAIN WO CONTRAST Result Date: 08/14/2023 CLINICAL DATA:  Neuro deficit, acute, stroke suspected EXAM: MRI HEAD WITHOUT CONTRAST TECHNIQUE: Multiplanar, multiecho pulse sequences of the brain and surrounding structures were obtained without intravenous contrast. COMPARISON:  None Available. FINDINGS: Brain: Multiple acute infarcts in the right MCA distribution including the posterior right insula and overlying frontal and parietal lobes. Associated edema without substantial mass effect. No evidence of acute hemorrhage, mass lesion, or midline shift. No hydrocephalus. Additional patchy T2/FLAIR hyperintensities within the white matter are compatible with mild for age chronic microvascular ischemic disease. Vascular: Major arterial flow voids are maintained at the skull base. Skull and upper cervical spine: Normal marrow signal. Sinuses/Orbits: Mild paranasal sinus mucosal thickening. No acute orbital findings. Other: Small left mastoid effusion. IMPRESSION: Multiple acute infarcts in the right MCA distribution including the posterior right insula and overlying frontal and parietal lobes Electronically Signed   By: Feliberto Harts M.D.   On: 08/14/2023 00:04    Microbiology: Results for orders placed or performed during the hospital encounter of 05/17/20  SARS CORONAVIRUS 2 (TAT 6-24 HRS)  Nasopharyngeal Nasopharyngeal Swab     Status: None   Collection Time: 05/17/20  1:14 PM   Specimen: Nasopharyngeal Swab  Result Value Ref Range Status   SARS Coronavirus 2 NEGATIVE NEGATIVE Final    Comment: (NOTE) SARS-CoV-2 target nucleic acids are NOT DETECTED.  The SARS-CoV-2 RNA is generally detectable in upper and lower respiratory specimens during the acute phase of infection. Negative results do not preclude SARS-CoV-2 infection, do not rule out co-infections with other pathogens, and should not be used as the sole basis for treatment or other patient management decisions. Negative results must be combined with clinical observations, patient history, and epidemiological information. The expected result is  Negative.  Fact Sheet for Patients: HairSlick.no  Fact Sheet for Healthcare Providers: quierodirigir.com  This test is not yet approved or cleared by the [place] FDA and  has been authorized for detection and/or diagnosis of SARS-CoV-2 by FDA under an Emergency Use Authorization (EUA). This EUA will remain  in effect (meaning this test can be used) for the duration of the COVID-19 declaration under Se ction 564(b)(1) of the Act, 21 U.S.C. section 360bbb-3(b)(1), unless the authorization is terminated or revoked sooner.  Performed at [facility], [address], [place], [place] [address]     Labs: CBC: Recent Labs  Lab 08/13/23 2248 08/14/23 0408  WBC 9.4 8.0  NEUTROABS 6.2  --   HGB 10.8* 10.5*  HCT 32.5* 32.2*  MCV 101.6* 102.2*  PLT 239 228   Basic Metabolic Panel: Recent Labs  Lab 08/13/23 2248 08/14/23 0408  NA 142 140  K 3.6 3.5  CL 110 108  CO2 22 24  GLUCOSE 112* 93  BUN 20 18  CREATININE 0.98 0.86  CALCIUM 9.6 9.3   Liver Function Tests: Recent Labs  Lab 08/13/23 2248  AST 21  ALT 12  ALKPHOS 41  BILITOT 0.9  PROT 6.1*  ALBUMIN 3.2*   CBG: Recent Labs  Lab  08/14/23 0717 08/14/23 1106  GLUCAP 90 139*    Discharge time spent: greater than 30 minutes.  Signed: Tyrone Nine, MD [facility] 08/14/2023

## 2023-08-14 NOTE — Progress Notes (Signed)
 Patient discharged home today, transported home by family. Discharge paperwork went over with patient and daughter, both verbalized understanding. Belongings sent home with patient. Patient received early stages of recovery stroke book.

## 2023-08-14 NOTE — ED Notes (Signed)
 ED TO INPATIENT HANDOFF REPORT  ED Nurse Name and Phone #:   S Name/Age/Gender Sophia [name] 89 y.o. female Room/Bed: APA01/APA01  Code Status   Code Status: Full Code  Home/SNF/Other Home Patient oriented to: self, place, time, and situation Is this baseline? Yes   Triage Complete: Triage complete  Chief Complaint Acute ischemic right MCA stroke [facility]) [I63.511]  Triage Note Pov from home with family. Says she fell in the bathroom. "Just lost my balance"  fell around 230 this am.  No LOC did not hit head. Only complains of her left elbow (small skin tear). But when talking to the family they said that her hand is now having trouble with dexterity and patient admitted that her left leg feels like she can't take a good step.   Since symptoms haven't resolved family is worried that she might have had a stroke vs hit a nerve on her elbow  No blood thinners    Allergies No Known Allergies  Level of Care/Admitting Diagnosis ED Disposition     ED Disposition  Admit   Condition  --   Comment  Hospital Area: [facility] [[id number]]  Level of Care: Telemetry [5]  Covid Evaluation: Asymptomatic - no recent exposure (last 10 days) testing not required  Diagnosis: Acute ischemic right MCA stroke [facility]) [[id number]]  Admitting Physician: Briscoe Deutscher [[id number]]  Attending Physician: Briscoe Deutscher [[id number]]          B Medical/Surgery History 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   Chronic kidney disease    Diabetes mellitus without complication (HCC)    GERD (gastroesophageal reflux disease)    Hyperlipidemia    Hypertension    Hypothyroidism    Past Surgical History:  Procedure Laterality Date   ADJACENT TISSUE TRANSFER/TISSUE REARRANGEMENT N/A 05/21/2020   Procedure: closure with adjacent tissue transfer;  Surgeon: Allena Napoleon, MD;  Location: [facility] OR;  Service: Plastics;  Laterality: N/A;   BASAL CELL CARCINOMA  EXCISION N/A 05/21/2020   Procedure: excision of scalp squamous cell carcinoma with frozen sections;  Surgeon: Allena Napoleon, MD;  Location: [facility] OR;  Service: Plastics;  Laterality: N/A;  1.5 hours total   CHOLECYSTECTOMY     COLONOSCOPY     ESOPHAGOGASTRODUODENOSCOPY N/A 04/29/2015   Procedure: ESOPHAGOGASTRODUODENOSCOPY (EGD);  Surgeon: Vida Rigger, MD;  Location: [facility] ENDOSCOPY;  Service: Endoscopy;  Laterality: N/A;   EYE SURGERY Right 01/19/2019   blocked tear duct    SIGMOIDOSCOPY       A IV Location/Drains/Wounds Patient Lines/Drains/Airways Status     Active Line/Drains/Airways     Name Placement date Placement time Site Days   Incision (Closed) 04/30/15 Groin Left 04/30/15  0125  -- 3028   Incision (Closed) 05/21/20 Head Other (Comment) 05/21/20  1327  -- 1180            Intake/Output Last 24 hours No intake or output data in the 24 hours ending 08/14/23 0147  Labs/Imaging Results for orders placed or performed during the hospital encounter of 08/13/23 (from the past 48 hours)  CBC with Differential     Status: Abnormal   Collection Time: 08/13/23 10:48 PM  Result Value Ref Range   WBC 9.4 4.0 - 10.5 K/uL   RBC 3.20 (L) 3.87 - 5.11 MIL/uL   Hemoglobin 10.8 (L) 12.0 - 15.0 g/dL   HCT 57.8 (L) 46.9 - 62.9 %   MCV 101.6 (H)  80.0 - 100.0 fL   MCH 33.8 26.0 - 34.0 pg   MCHC 33.2 30.0 - 36.0 g/dL   RDW 16.1 09.6 - 04.5 %   Platelets 239 150 - 400 K/uL   nRBC 0.0 0.0 - 0.2 %   Neutrophils Relative % 65 %   Neutro Abs 6.2 1.7 - 7.7 K/uL   Lymphocytes Relative 23 %   Lymphs Abs 2.1 0.7 - 4.0 K/uL   Monocytes Relative 9 %   Monocytes Absolute 0.8 0.1 - 1.0 K/uL   Eosinophils Relative 2 %   Eosinophils Absolute 0.2 0.0 - 0.5 K/uL   Basophils Relative 1 %   Basophils Absolute 0.1 0.0 - 0.1 K/uL   Immature Granulocytes 0 %   Abs Immature Granulocytes 0.03 0.00 - 0.07 K/uL    Comment: Performed at [facility], [address] [place], [place] [address]  Comprehensive  metabolic panel     Status: Abnormal   Collection Time: 08/13/23 10:48 PM  Result Value Ref Range   Sodium 142 135 - 145 mmol/L   Potassium 3.6 3.5 - 5.1 mmol/L   Chloride 110 98 - 111 mmol/L   CO2 22 22 - 32 mmol/L   Glucose, Bld 112 (H) 70 - 99 mg/dL    Comment: Glucose reference range applies only to samples taken after fasting for at least 8 hours.   BUN 20 8 - 23 mg/dL   Creatinine, Ser 1.91 0.44 - 1.00 mg/dL   Calcium 9.6 8.9 - 47.8 mg/dL   Total Protein 6.1 (L) 6.5 - 8.1 g/dL   Albumin 3.2 (L) 3.5 - 5.0 g/dL   AST 21 15 - 41 U/L   ALT 12 0 - 44 U/L   Alkaline Phosphatase 41 38 - 126 U/L   Total Bilirubin 0.9 0.0 - 1.2 mg/dL   GFR, Estimated 55 (L) >60 mL/min    Comment: (NOTE) Calculated using the CKD-EPI Creatinine Equation (2021)    Anion gap 10 5 - 15    Comment: Performed at [facility], [address], [place], [place] [address]  Protime-INR     Status: Abnormal   Collection Time: 08/13/23 10:48 PM  Result Value Ref Range   Prothrombin Time 15.7 (H) 11.4 - 15.2 seconds   INR 1.2 0.8 - 1.2    Comment: (NOTE) INR goal varies based on device and disease states. Performed at [facility], [address] [place], [place] [address]    MR BRAIN WO CONTRAST Result Date: 08/14/2023 CLINICAL DATA:  Neuro deficit, acute, stroke suspected EXAM: MRI HEAD WITHOUT CONTRAST TECHNIQUE: Multiplanar, multiecho pulse sequences of the brain and surrounding structures were obtained without intravenous contrast. COMPARISON:  None Available. FINDINGS: Brain: Multiple acute infarcts in the right MCA distribution including the posterior right insula and overlying frontal and parietal lobes. Associated edema without substantial mass effect. No evidence of acute hemorrhage, mass lesion, or midline shift. No hydrocephalus. Additional patchy T2/FLAIR hyperintensities within the white matter are compatible with mild for age chronic microvascular ischemic disease. Vascular: Major arterial flow voids  are maintained at the skull base. Skull and upper cervical spine: Normal marrow signal. Sinuses/Orbits: Mild paranasal sinus mucosal thickening. No acute orbital findings. Other: Small left mastoid effusion. IMPRESSION: Multiple acute infarcts in the right MCA distribution including the posterior right insula and overlying frontal and parietal lobes Electronically Signed   By: Feliberto Harts M.D.   On: 08/14/2023 00:04    Pending Labs Unresulted Labs (From admission, onward)  Start     Ordered   08/21/23 0500  Creatinine, serum  (enoxaparin (LOVENOX)    CrCl >/= 30 ml/min)  Weekly,   R     Comments: while on enoxaparin therapy    08/14/23 0145   08/14/23 0500  Lipid panel  (Labs)  Tomorrow morning,   R       Comments: Fasting    08/14/23 0145   08/14/23 0500  Hemoglobin A1c  (Labs)  Tomorrow morning,   R       Comments: To assess prior glycemic control    08/14/23 0145   08/14/23 0500  CBC  (Labs)  Daily,   R      08/14/23 0145   08/14/23 0500  Basic metabolic panel  Daily,   R      08/14/23 0145            Vitals/Pain Today's Vitals   08/13/23 2147 08/13/23 2200 08/13/23 2215 08/13/23 2245  BP:  (!) 166/59 (!) 153/62 (!) 142/100  Pulse:  62 62 64  Resp:  16  (!) 21  Temp:      TempSrc:      SpO2:  97% 97% 95%  Weight: 123 lb 0.3 oz (55.8 kg)     Height: 5\' 2"  (1.575 m)     PainSc: 0-No pain       Isolation Precautions No active isolations  Medications Medications  donepezil (ARICEPT) tablet 5 mg (has no administration in time range)  levothyroxine (SYNTHROID) tablet 100 mcg (has no administration in time range)  pantoprazole (PROTONIX) EC tablet 40 mg (has no administration in time range)   stroke: early stages of recovery book (has no administration in time range)  acetaminophen (TYLENOL) tablet 650 mg (has no administration in time range)    Or  acetaminophen (TYLENOL) 160 MG/5ML solution 650 mg (has no administration in time range)    Or   acetaminophen (TYLENOL) suppository 650 mg (has no administration in time range)  senna-docusate (Senokot-S) tablet 1 tablet (has no administration in time range)  enoxaparin (LOVENOX) injection 40 mg (has no administration in time range)  aspirin suppository 300 mg (has no administration in time range)    Or  aspirin tablet 325 mg (has no administration in time range)  insulin aspart (novoLOG) injection 0-6 Units (has no administration in time range)  insulin aspart (novoLOG) injection 0-5 Units (has no administration in time range)  aspirin EC tablet 81 mg (has no administration in time range)  clopidogrel (PLAVIX) tablet 75 mg (has no administration in time range)    Mobility walks     Focused Assessments    R Recommendations: See Admitting Provider Note  Report given to:   Additional Notes:

## 2023-08-14 NOTE — Evaluation (Signed)
 Physical Therapy Evaluation Patient Details Name: Sarabeth Benton MRN: [id number] DOB: Jun 04, 1931 Today's Date: 08/14/2023  History of Present Illness  Sophia [name] is a pleasant right-hand-dominant 89 y.o. female with medical history significant for hypertension, hyperlipidemia, hypothyroidism, type 2 diabetes mellitus, and remote history of bleeding peptic ulcer who presents with difficulty using her left arm and left leg weakness.     Patient was in her usual state when she went to bed the night of 08/12/2023 but fell at approximately 2:30 AM the following morning when she got up to use the bathroom.  Since then, she has had difficulty using her left hand, and has been dropping things that she thought she was holding onto.  Family was also concerned that she appeared to be dragging her left leg slightly while ambulating.      She had a abrasion on her left elbow from the fall but denies any other injury, denies headache, denies change in vision, and denies any numbness or tingling.  She has not had any recent chest pain or palpitations.   Clinical Impression  Patient demonstrates decreased LUE/LLE strength, impaired sensation on LUE/LLE/L side of face, impaired LUE coordination, impaired left side peripheral vision compared to right, and mild balance deficits all consistent with MCA diagnosis. Patient demonstrates diminished sensation in LLE compared to the RLE but more sensation deficit seen in LUE all the way to the shoulder, pt also reports diminished left sided face sensation compared to the right. Patient demonstrates decreased strength on the left side compared to the right side, again LUE more deficits observed. Pt demonstrates difficulty completing donning socks especially with LUE at this time although L hand is able to assist. Pt demonstrates need for CGA for ambulation with left and right deviations noted in the hallway but pt is mod independent with functional transfers and bed  mobility. Patient would continue to benefit from skilled acute physical therapy for increased independence with ambulation, increased LE/UE strength, and balance for improved quality of life, improved level of function and continued progress towards therapy goals.         If plan is discharge home, recommend the following: A little help with walking and/or transfers;A little help with bathing/dressing/bathroom;Assistance with cooking/housework;Assist for transportation;Help with stairs or ramp for entrance   Can travel by private vehicle        Equipment Recommendations None recommended by PT  Recommendations for Other Services       Functional Status Assessment Patient has had a recent decline in their functional status and demonstrates the ability to make significant improvements in function in a reasonable and predictable amount of time.     Precautions / Restrictions Precautions Precautions: Fall Recall of Precautions/Restrictions: Intact Restrictions Weight Bearing Restrictions Per Provider Order: No      Mobility  Bed Mobility Overal bed mobility: Modified Independent             General bed mobility comments: increased time    Transfers Overall transfer level: Modified independent                 General transfer comment: CGA, increased time    Ambulation/Gait Ambulation/Gait assistance: Supervision, Contact guard assist Gait Distance (Feet): 85 Feet Assistive device: None Gait Pattern/deviations: WFL(Within Functional Limits), Step-through pattern, Decreased step length - right, Decreased step length - left, Decreased stride length Gait velocity: decreased     General Gait Details: some deviations off of straight line in hall, requires cueing for navigation of busy hallway,  CGA no LOB at this time  Careers information officer     Tilt Bed    Modified Rankin (Stroke Patients Only)       Balance Overall balance  assessment: Mild deficits observed, not formally tested                                           Pertinent Vitals/Pain Pain Assessment Pain Assessment: No/denies pain    Home Living Family/patient expects to be discharged to:: Private residence Living Arrangements: Children Available Help at Discharge: Family Type of Home: House Home Access: Stairs to enter   Secretary/administrator of Steps: 2   Home Layout: One level Home Equipment: Agricultural consultant (2 wheels);Cane - single point      Prior Function Prior Level of Function : Independent/Modified Independent             Mobility Comments: driving, independent with community ambulation ADLs Comments: independent with ADLs, lives with daughter who is not currently working     Extremity/Trunk Assessment   Upper Extremity Assessment Upper Extremity Assessment: Generalized weakness;LUE deficits/detail LUE Deficits / Details: shoulder flexion/ext 3/5; elbow flex/ext 3+/5; decreased grip strength compared to the right still WFL LUE Sensation: decreased light touch;decreased proprioception (pt hits left eye when testing coordination insteat of nose; pt unable to feel anything during sensation for LUE until shoulder and is still diminished when compared to the RUE.) LUE Coordination: decreased gross motor    Lower Extremity Assessment Lower Extremity Assessment: Overall WFL for tasks assessed;Generalized weakness;LLE deficits/detail LLE Deficits / Details: LLE 4-/5 MMT strength compared to RLE 5/5 MMT LLE Sensation: decreased light touch (Pt able to feel sensation on LLE but is diminished compared to the RLE from foot to lateral thigh) LLE Coordination: decreased gross motor    Cervical / Trunk Assessment Cervical / Trunk Assessment: Kyphotic  Communication   Communication Communication: No apparent difficulties    Cognition Arousal: Alert Behavior During Therapy: WFL for tasks assessed/performed   PT  - Cognitive impairments: Orientation   Orientation impairments: Place                   PT - Cognition Comments: pt knew she was in [facility] and at [facility] but [facility] at [facility] [place] Following commands: Intact       Cueing Cueing Techniques: Verbal cues, Visual cues     General Comments      Exercises     Assessment/Plan    PT Assessment Patient needs continued PT services  PT Problem List Decreased strength;Decreased activity tolerance;Decreased balance;Decreased mobility;Decreased coordination;Impaired sensation;Decreased knowledge of precautions       PT Treatment Interventions Gait training;Stair training;Functional mobility training;Therapeutic activities;Therapeutic exercise;Balance training;Neuromuscular re-education;Patient/family education    PT Goals (Current goals can be found in the Care Plan section)  Acute Rehab PT Goals Patient Stated Goal: to get stronger PT Goal Formulation: With patient Time For Goal Achievement: 08/28/23 Potential to Achieve Goals: Good    Frequency Min 3X/week     Co-evaluation               AM-PAC PT "6 Clicks" Mobility  Outcome Measure Help needed turning from your back to your side while in a flat bed without using bedrails?: A Little Help needed moving from lying on your back  to sitting on the side of a flat bed without using bedrails?: A Little Help needed moving to and from a bed to a chair (including a wheelchair)?: None Help needed standing up from a chair using your arms (e.g., wheelchair or bedside chair)?: None Help needed to walk in hospital room?: A Little Help needed climbing 3-5 steps with a railing? : A Lot 6 Click Score: 19    End of Session Equipment Utilized During Treatment: Gait belt Activity Tolerance: Patient tolerated treatment well Patient left: in chair;with call bell/phone within reach;with chair alarm set Nurse Communication: Mobility status;Precautions PT Visit  Diagnosis: Other abnormalities of gait and mobility (R26.89);Muscle weakness (generalized) (M62.81)    Time: 1610-9604 PT Time Calculation (min) (ACUTE ONLY): 42 min   Charges:   PT Evaluation $PT Eval Low Complexity: 1 Low PT Treatments $Therapeutic Activity: 23-37 mins PT General Charges $$ ACUTE PT VISIT: 1 Visit         Luz Lex, PT, DPT [facility] Office: [phone] 10:36 AM, 08/14/23

## 2023-08-16 ENCOUNTER — Other Ambulatory Visit: Payer: Self-pay | Admitting: *Deleted

## 2023-08-16 LAB — GLUCOSE, CAPILLARY: Glucose-Capillary: 84 mg/dL (ref 70–99)

## 2023-08-16 MED ORDER — RISEDRONATE SODIUM 150 MG PO TABS: 150.0000 mg | ORAL_TABLET | ORAL | 0 refills | Status: DC

## 2023-08-20 ENCOUNTER — Encounter: Payer: Self-pay | Admitting: Nurse Practitioner

## 2023-08-20 ENCOUNTER — Ambulatory Visit (INDEPENDENT_AMBULATORY_CARE_PROVIDER_SITE_OTHER): Admitting: Nurse Practitioner

## 2023-08-20 VITALS — BP 135/68 | HR 59 | Temp 97.4°F | Ht 62.0 in | Wt 120.0 lb

## 2023-08-20 DIAGNOSIS — Z09 Encounter for follow-up examination after completed treatment for conditions other than malignant neoplasm: Secondary | ICD-10-CM

## 2023-08-20 DIAGNOSIS — E119 Type 2 diabetes mellitus without complications: Secondary | ICD-10-CM

## 2023-08-20 DIAGNOSIS — I6930 Unspecified sequelae of cerebral infarction: Secondary | ICD-10-CM | POA: Diagnosis not present

## 2023-08-20 NOTE — Progress Notes (Signed)
 Subjective:    Patient ID: Sophia [name], female    DOB: 1931/08/14, 89 y.o.   MRN: [id number]   Chief Complaint: hospital follow up  HPI  Patient was hospitalized after being dx with acute ischemic MCA stroke. She was treated and released on plavix, which she is to take for 3 months. HGBA1c was 5.8% and they recommended holding metformin for now. Since discharge she feels good other than feeling tired. Her left hand is still a little weak and a little unsteady in her feet. She is getting home PT.  Patient Active Problem List   Diagnosis Date Noted   Acute ischemic right MCA stroke (HCC) 08/14/2023   Squamous cell carcinoma of scalp 06/11/2020   Thyroid activity decreased    Gastrointestinal hemorrhage associated with duodenal ulcer    Diabetes mellitus without complication (HCC)    Chronic kidney disease    Hyperlipidemia 10/24/2012   Essential (primary) hypertension 10/24/2012   Osteoporosis 10/24/2012   Vitamin D deficiency 10/24/2012   Hyperglycemia 10/24/2012   Benign neoplasm of colon 07/20/2011   Diverticulosis of colon 07/20/2011       Review of Systems  Constitutional:  Negative for diaphoresis.  Eyes:  Negative for pain.  Respiratory:  Negative for shortness of breath.   Cardiovascular:  Negative for chest pain, palpitations and leg swelling.  Gastrointestinal:  Negative for abdominal pain.  Endocrine: Negative for polydipsia.  Skin:  Negative for rash.  Neurological:  Negative for dizziness, weakness and headaches.  Hematological:  Does not bruise/bleed easily.  All other systems reviewed and are negative.      Objective:   Physical Exam Vitals and nursing note reviewed.  Constitutional:      General: She is not in acute distress.    Appearance: Normal appearance. She is well-developed.  Neck:     Vascular: No carotid bruit or JVD.  Cardiovascular:     Rate and Rhythm: Normal rate and regular rhythm.     Heart sounds: Normal heart sounds.   Pulmonary:     Effort: Pulmonary effort is normal. No respiratory distress.     Breath sounds: Normal breath sounds. No wheezing or rales.  Chest:     Chest wall: No tenderness.  Abdominal:     General: Bowel sounds are normal. There is no distension or abdominal bruit.     Palpations: Abdomen is soft. There is no hepatomegaly, splenomegaly, mass or pulsatile mass.     Tenderness: There is no abdominal tenderness.  Musculoskeletal:        General: Normal range of motion.     Cervical back: Normal range of motion and neck supple.     Comments: Ambulate with walker Grips equal bil Motor strength and sensation distally intact  Lymphadenopathy:     Cervical: No cervical adenopathy.  Skin:    General: Skin is warm and dry.  Neurological:     Mental Status: She is alert and oriented to person, place, and time.     Deep Tendon Reflexes: Reflexes are normal and symmetric.  Psychiatric:        Behavior: Behavior normal.        Thought Content: Thought content normal.        Judgment: Judgment normal.     BP 135/68   Pulse (!) 59   Temp (!) 97.4 F (36.3 C) (Temporal)   Ht 5\' 2"  (1.575 m)   Wt 120 lb (54.4 kg)   BMI 21.95 kg/m  Assessment & Plan:  Sophia [name] in today with chief complaint of Hospitalization Follow-up   1. Late effect of cerebrovascular accident (CVA) (Primary) Continue home PT - BMP8+EGFR  2. Hospital discharge follow-up Hospital records reviewed  3. Diabetes mellitus without complication (HCC) Hold metformin for now Keep follow up appt with DR. [name]    The above assessment and management plan was discussed with the patient. The patient verbalized understanding of and has agreed to the management plan. Patient is aware to call the clinic if symptoms persist or worsen. Patient is aware when to return to the clinic for a follow-up visit. Patient educated on when it is appropriate to go to the emergency department.   Mary-Margaret  Daphine Deutscher, FNP

## 2023-08-20 NOTE — Patient Instructions (Signed)
 Fall Prevention in the Home, Adult Falls can cause injuries and can happen to people of all ages. There are many things you can do to make your home safer and to help prevent falls. What actions can I take to prevent falls? General information Use good lighting in all rooms. Make sure to: Replace any light bulbs that burn out. Turn on the lights in dark areas and use night-lights. Keep items that you use often in easy-to-reach places. Lower the shelves around your home if needed. Move furniture so that there are clear paths around it. Do not use throw rugs or other things on the floor that can make you trip. If any of your floors are uneven, fix them. Add color or contrast paint or tape to clearly mark and help you see: Grab bars or handrails. First and last steps of staircases. Where the edge of each step is. If you use a ladder or stepladder: Make sure that it is fully opened. Do not climb a closed ladder. Make sure the sides of the ladder are locked in place. Have someone hold the ladder while you use it. Know where your pets are as you move through your home. What can I do in the bathroom?     Keep the floor dry. Clean up any water on the floor right away. Remove soap buildup in the bathtub or shower. Buildup makes bathtubs and showers slippery. Use non-skid mats or decals on the floor of the bathtub or shower. Attach bath mats securely with double-sided, non-slip rug tape. If you need to sit down in the shower, use a non-slip stool. Install grab bars by the toilet and in the bathtub and shower. Do not use towel bars as grab bars. What can I do in the bedroom? Make sure that you have a light by your bed that is easy to reach. Do not use any sheets or blankets on your bed that hang to the floor. Have a firm chair or bench with side arms that you can use for support when you get dressed. What can I do in the kitchen? Clean up any spills right away. If you need to reach something  above you, use a step stool with a grab bar. Keep electrical cords out of the way. Do not use floor polish or wax that makes floors slippery. What can I do with my stairs? Do not leave anything on the stairs. Make sure that you have a light switch at the top and the bottom of the stairs. Make sure that there are handrails on both sides of the stairs. Fix handrails that are broken or loose. Install non-slip stair treads on all your stairs if they do not have carpet. Avoid having throw rugs at the top or bottom of the stairs. Choose a carpet that does not hide the edge of the steps on the stairs. Make sure that the carpet is firmly attached to the stairs. Fix carpet that is loose or worn. What can I do on the outside of my home? Use bright outdoor lighting. Fix the edges of walkways and driveways and fix any cracks. Clear paths of anything that can make you trip, such as tools or rocks. Add color or contrast paint or tape to clearly mark and help you see anything that might make you trip as you walk through a door, such as a raised step or threshold. Trim any bushes or trees on paths to your home. Check to see if handrails are loose  or broken and that both sides of all steps have handrails. Install guardrails along the edges of any raised decks and porches. Have leaves, snow, or ice cleared regularly. Use sand, salt, or ice melter on paths if you live where there is ice and snow during the winter. Clean up any spills in your garage right away. This includes grease or oil spills. What other actions can I take? Review your medicines with your doctor. Some medicines can cause dizziness or changes in blood pressure, which increase your risk of falling. Wear shoes that: Have a low heel. Do not wear high heels. Have rubber bottoms and are closed at the toe. Feel good on your feet and fit well. Use tools that help you move around if needed. These include: Canes. Walkers. Scooters. Crutches. Ask  your doctor what else you can do to help prevent falls. This may include seeing a physical therapist to learn to do exercises to move better and get stronger. Where to find more information Centers for Disease Control and Prevention, STEADI: TonerPromos.no [employer] on Aging: BaseRingTones.pl National Institute on Aging: BaseRingTones.pl Contact a doctor if: You are afraid of falling at home. You feel weak, drowsy, or dizzy at home. You fall at home. Get help right away if you: Lose consciousness or have trouble moving after a fall. Have a fall that causes a head injury. These symptoms may be an emergency. Get help right away. Call 911. Do not wait to see if the symptoms will go away. Do not drive yourself to the hospital. This information is not intended to replace advice given to you by your health care provider. Make sure you discuss any questions you have with your health care provider. Document Revised: 01/05/2022 Document Reviewed: 01/05/2022 Elsevier Patient Education  2024 [employer].

## 2023-08-21 LAB — BMP8+EGFR
BUN/Creatinine Ratio: 37 — ABNORMAL HIGH (ref 12–28)
BUN: 38 mg/dL — ABNORMAL HIGH (ref 8–27)
CO2: 19 mmol/L — ABNORMAL LOW (ref 20–29)
Calcium: 9.6 mg/dL (ref 8.7–10.3)
Chloride: 110 mmol/L — ABNORMAL HIGH (ref 96–106)
Creatinine, Ser: 1.04 mg/dL — ABNORMAL HIGH (ref 0.57–1.00)
Glucose: 151 mg/dL — ABNORMAL HIGH (ref 70–99)
Potassium: 3.8 mmol/L (ref 3.5–5.2)
Sodium: 146 mmol/L — ABNORMAL HIGH (ref 134–144)
eGFR: 51 mL/min/{1.73_m2} — ABNORMAL LOW (ref >59)

## 2023-09-09 ENCOUNTER — Other Ambulatory Visit: Payer: Self-pay | Admitting: Family Medicine

## 2023-09-20 ENCOUNTER — Ambulatory Visit (INDEPENDENT_AMBULATORY_CARE_PROVIDER_SITE_OTHER)

## 2023-09-20 DIAGNOSIS — I10 Essential (primary) hypertension: Secondary | ICD-10-CM | POA: Diagnosis not present

## 2023-09-20 DIAGNOSIS — E785 Hyperlipidemia, unspecified: Secondary | ICD-10-CM

## 2023-09-20 DIAGNOSIS — I69354 Hemiplegia and hemiparesis following cerebral infarction affecting left non-dominant side: Secondary | ICD-10-CM

## 2023-09-20 DIAGNOSIS — Z7984 Long term (current) use of oral hypoglycemic drugs: Secondary | ICD-10-CM

## 2023-09-20 DIAGNOSIS — E119 Type 2 diabetes mellitus without complications: Secondary | ICD-10-CM

## 2023-09-20 DIAGNOSIS — Z7902 Long term (current) use of antithrombotics/antiplatelets: Secondary | ICD-10-CM

## 2023-09-20 DIAGNOSIS — Z7982 Long term (current) use of aspirin: Secondary | ICD-10-CM

## 2023-09-20 DIAGNOSIS — E039 Hypothyroidism, unspecified: Secondary | ICD-10-CM

## 2023-09-21 ENCOUNTER — Telehealth: Payer: Self-pay

## 2023-09-21 NOTE — Telephone Encounter (Signed)
 Left vm for cb

## 2023-09-21 NOTE — Telephone Encounter (Signed)
 Copied from CRM [id number]. Topic: Clinical - Home Health Verbal Orders >> Sep 21, 2023  8:18 AM Madelyne Schiff wrote: Caller/Agency: zacharia with [facility]  Callback Number:[phone] Service Requested: Occupational Therapy Frequency: 1w4  Any new concerns about the patient? No

## 2023-09-27 ENCOUNTER — Ambulatory Visit: Payer: Self-pay

## 2023-09-27 NOTE — Telephone Encounter (Signed)
 Copied from CRM [id number]. Topic: Clinical - Red Word Triage >> Sep 27, 2023 12:49 PM Tiffany H wrote: Red Word that prompted transfer to Nurse Triage: Sophia [name] PT called to advise that patient's Resting HR, which is usually around 60, dropped to 54-58 after 10 minutes of light exercise at the kitchen sink with legs.   BP: 110/60 After Exercise, 122/60 Usual Resting:123/78  Sophia [name] Phone:[phone]  Chief Complaint: Decreased HR Symptoms: Decreased BP, no additional symptoms reported Frequency: Today Pertinent Negatives: Patient denies dizziness Disposition: [] ED /[] Urgent Care (no appt availability in office) / [x] Appointment(In office/virtual)/ []  [facility]/ [] Home Care/ [] Refused Recommended Disposition /[] [facility]/ []  Follow-up with PCP Additional Notes: Spoke to [name], patient's home health PT assistant, on behalf of patient. Sophia [name] called in to report decreased HR and BP from baseline. Sophia [name] reported a resting HR of 53-61 BPM and stated 62 BPM has been the baseline. Sophia [name] also reported a BP of 110/60. Patient denied symptoms at this time. Advised patient to be seen within 24 hours, per protocol and nursing judgement. Scheduled with PCP tomorrow morning. Provided care advice and instructed patient to call back if symptoms worsen. Patient complied.   Reason for Disposition  Age > 60 years  (Exception: Brief heartbeat symptoms that went away and now feels well.)  Answer Assessment - Initial Assessment Questions 1. DESCRIPTION: "Please describe your heart rate or heartbeat that you are having" (e.g., fast/slow, regular/irregular, skipped or extra beats, "palpitations")     Resting HR 53-61 BPM for past 15 minutes, PT assistant states 62 has been baseline 2. ONSET: "When did it start?" (Minutes, hours or days)      This morning  6. HEART RATE: "Can you tell me your heart rate?" "How many beats in 15 seconds?"  (Note: not all patients can do this)        Resting HR  53-61 BPM 7. RECURRENT SYMPTOM: "Have you ever had this before?" If Yes, ask: "When was the last time?" and "What happened that time?"      Denies 8. CAUSE: "What do you think is causing the palpitations?"     Unknown 9. CARDIAC HISTORY: "Do you have any history of heart disease?" (e.g., heart attack, angina, bypass surgery, angioplasty, arrhythmia)      Denies 10. OTHER SYMPTOMS: "Do you have any other symptoms?" (e.g., dizziness, chest pain, sweating, difficulty breathing)       Denies dizziness, denies chest pain, denies sweating "Everything feels normal" BP 110/60 (decrease from baseline)  Protocols used: Heart Rate and Heartbeat Questions-A-AH

## 2023-09-28 ENCOUNTER — Encounter: Payer: Self-pay | Admitting: Family Medicine

## 2023-09-28 ENCOUNTER — Ambulatory Visit (INDEPENDENT_AMBULATORY_CARE_PROVIDER_SITE_OTHER): Admitting: Family Medicine

## 2023-09-28 VITALS — BP 151/6 | HR 60 | Temp 97.8°F | Ht 62.0 in | Wt 119.0 lb

## 2023-09-28 DIAGNOSIS — R001 Bradycardia, unspecified: Secondary | ICD-10-CM | POA: Diagnosis not present

## 2023-09-28 DIAGNOSIS — E038 Other specified hypothyroidism: Secondary | ICD-10-CM | POA: Diagnosis not present

## 2023-09-28 DIAGNOSIS — I10 Essential (primary) hypertension: Secondary | ICD-10-CM | POA: Diagnosis not present

## 2023-09-28 MED ORDER — AMLODIPINE BESYLATE 10 MG PO TABS: 10.0000 mg | ORAL_TABLET | Freq: Every day | ORAL | 1 refills | Status: DC

## 2023-09-28 MED ORDER — CARVEDILOL 12.5 MG PO TABS: 18.7500 mg | ORAL_TABLET | Freq: Two times a day (BID) | ORAL | 1 refills | Status: DC

## 2023-09-28 NOTE — Telephone Encounter (Signed)
 Lmtcb

## 2023-09-28 NOTE — Progress Notes (Signed)
 Subjective:  Patient ID: Sophia [name], female    DOB: [birth year]  Age: 89 y.o. MRN: [id number]  CC: stroke (March 28th/Pt noticed yesterday HR was 60 resting /Pt felt okay but PT wanted her to see us . )   HPI  Tempa Wittkop presents for concern for bradycardia yesterday.  She had not noticed it before.  However when she was exercising during her physical therapy yesterday she was noted to have heart rate dropped into the 50-60 range.  She did feel a bit tired and woozy.  She has been constantly cold recently.  As a result she is also concerned that perhaps her thyroid  is acting up.  She has not been checking her blood sugar recently.  However the symptoms that she felt did not seem consistent with low or high blood sugar as compared to what she is experienced in the past.  Bradycardia was not associated with chest pain palpitations shortness of breath or syncope.     08/20/2023    2:31 PM 07/20/2023    2:08 PM 12/21/2022    2:13 PM  Depression screen PHQ 2/9  Decreased Interest 0 0 0  Down, Depressed, Hopeless 0 0 0  PHQ - 2 Score 0 0 0  Altered sleeping 0 0   Tired, decreased energy 0 0   Change in appetite 0 0   Feeling bad or failure about yourself  0 0   Trouble concentrating 0 0   Moving slowly or fidgety/restless 0 0   Suicidal thoughts 0 0   PHQ-9 Score 0 0   Difficult doing work/chores Not difficult at all Not difficult at all     History Beya has a past medical history of Cancer (HCC), Chronic kidney disease, Diabetes mellitus without complication (HCC), GERD (gastroesophageal reflux disease), Hyperlipidemia, Hypertension, and Hypothyroidism.   She has a past surgical history that includes Cholecystectomy; Sigmoidoscopy; Esophagogastroduodenoscopy (N/A, 04/29/2015); Eye surgery (Right, 01/19/2019); Colonoscopy; Excision basal cell carcinoma (N/A, 05/21/2020); and Adjacent tissue transfer/tissue rearrangement (N/A, 05/21/2020).   Her family history includes Colon  cancer in her sister; Heart disease in her brother, father, and sister.She reports that she has never smoked. She has never used smokeless tobacco. She reports that she does not drink alcohol and does not use drugs.    ROS Review of Systems  Constitutional: Negative.   HENT: Negative.    Eyes:  Negative for visual disturbance.  Respiratory:  Negative for shortness of breath.   Cardiovascular:  Negative for chest pain.  Gastrointestinal:  Negative for abdominal pain.  Musculoskeletal:  Negative for arthralgias.    Objective:  BP (!) 151/6   Pulse 60   Temp 97.8 F (36.6 C)   Ht 5\' 2"  (1.575 m)   Wt 119 lb (54 kg)   SpO2 98%   BMI 21.77 kg/m   BP Readings from Last 3 Encounters:  09/28/23 (!) 151/6  08/20/23 135/68  08/14/23 (!) 144/55    Wt Readings from Last 3 Encounters:  09/28/23 119 lb (54 kg)  08/20/23 120 lb (54.4 kg)  08/13/23 123 lb 0.3 oz (55.8 kg)     Physical Exam Constitutional:      General: She is not in acute distress.    Appearance: She is well-developed.  HENT:     Head: Normocephalic and atraumatic.  Eyes:     Conjunctiva/sclera: Conjunctivae normal.     Pupils: Pupils are equal, round, and reactive to light.  Neck:     Thyroid : No thyromegaly.  Cardiovascular:     Rate and Rhythm: Normal rate and regular rhythm.     Heart sounds: Normal heart sounds. No murmur heard. Pulmonary:     Effort: Pulmonary effort is normal. No respiratory distress.     Breath sounds: Normal breath sounds. No wheezing or rales.  Abdominal:     General: Bowel sounds are normal. There is no distension.     Palpations: Abdomen is soft.     Tenderness: There is no abdominal tenderness.  Musculoskeletal:        General: Normal range of motion.     Cervical back: Normal range of motion and neck supple.  Lymphadenopathy:     Cervical: No cervical adenopathy.  Skin:    General: Skin is warm and dry.  Neurological:     General: No focal deficit present.     Mental  Status: She is alert and oriented to person, place, and time.     Motor: No weakness.     Gait: Gait normal.     Deep Tendon Reflexes: Reflexes normal.  Psychiatric:        Behavior: Behavior normal.        Thought Content: Thought content normal.        Judgment: Judgment normal.      Assessment & Plan:  Bradycardia -     CMP14+EGFR -     CBC with Differential/Platelet -     TSH + free T4  Essential (primary) hypertension -     Carvedilol ; Take 1.5 tablets (18.75 mg total) by mouth 2 (two) times daily with a meal.  Dispense: 90 tablet; Refill: 1 -     amLODIPine  Besylate; Take 1 tablet (10 mg total) by mouth daily.  Dispense: 90 tablet; Refill: 1 -     CMP14+EGFR -     CBC with Differential/Platelet -     TSH + free T4  Other specified hypothyroidism -     CMP14+EGFR -     CBC with Differential/Platelet -     TSH + free T4     Follow-up: Return in about 1 month (around 10/29/2023).  Roise Cleaver, M.D.

## 2023-09-29 ENCOUNTER — Ambulatory Visit (INDEPENDENT_AMBULATORY_CARE_PROVIDER_SITE_OTHER): Admitting: Neurology

## 2023-09-29 ENCOUNTER — Encounter: Payer: Self-pay | Admitting: Neurology

## 2023-09-29 ENCOUNTER — Other Ambulatory Visit: Payer: Self-pay | Admitting: Neurology

## 2023-09-29 ENCOUNTER — Ambulatory Visit: Payer: Self-pay | Admitting: Family Medicine

## 2023-09-29 VITALS — BP 130/64 | HR 62 | Ht 62.0 in | Wt 120.0 lb

## 2023-09-29 DIAGNOSIS — I679 Cerebrovascular disease, unspecified: Secondary | ICD-10-CM

## 2023-09-29 DIAGNOSIS — I63411 Cerebral infarction due to embolism of right middle cerebral artery: Secondary | ICD-10-CM

## 2023-09-29 DIAGNOSIS — G3184 Mild cognitive impairment, so stated: Secondary | ICD-10-CM

## 2023-09-29 LAB — CBC WITH DIFFERENTIAL/PLATELET
Basophils Absolute: 0.1 10*3/uL (ref 0.0–0.2)
Basos: 1 %
EOS (ABSOLUTE): 0.4 10*3/uL (ref 0.0–0.4)
Eos: 4 %
Hematocrit: 38 % (ref 34.0–46.6)
Hemoglobin: 12.2 g/dL (ref 11.1–15.9)
Immature Grans (Abs): 0 10*3/uL (ref 0.0–0.1)
Immature Granulocytes: 0 %
Lymphocytes Absolute: 1.5 10*3/uL (ref 0.7–3.1)
Lymphs: 18 %
MCH: 33.5 pg — ABNORMAL HIGH (ref 26.6–33.0)
MCHC: 32.1 g/dL (ref 31.5–35.7)
MCV: 104 fL — ABNORMAL HIGH (ref 79–97)
Monocytes Absolute: 0.6 10*3/uL (ref 0.1–0.9)
Monocytes: 7 %
Neutrophils Absolute: 5.8 10*3/uL (ref 1.4–7.0)
Neutrophils: 70 %
Platelets: 304 10*3/uL (ref 150–450)
RBC: 3.64 x10E6/uL — ABNORMAL LOW (ref 3.77–5.28)
RDW: 12 % (ref 11.7–15.4)
WBC: 8.3 10*3/uL (ref 3.4–10.8)

## 2023-09-29 LAB — TSH+FREE T4
Free T4: 1.72 ng/dL (ref 0.82–1.77)
TSH: 0.225 u[IU]/mL — ABNORMAL LOW (ref 0.450–4.500)

## 2023-09-29 LAB — CMP14+EGFR
ALT: 10 IU/L (ref 0–32)
AST: 18 IU/L (ref 0–40)
Albumin: 4.1 g/dL (ref 3.7–4.7)
Alkaline Phosphatase: 68 IU/L (ref 44–121)
BUN/Creatinine Ratio: 19 (ref 12–28)
BUN: 20 mg/dL (ref 8–27)
Bilirubin Total: 0.4 mg/dL (ref 0.0–1.2)
CO2: 22 mmol/L (ref 20–29)
Calcium: 9.6 mg/dL (ref 8.7–10.3)
Chloride: 108 mmol/L — ABNORMAL HIGH (ref 96–106)
Creatinine, Ser: 1.05 mg/dL — ABNORMAL HIGH (ref 0.57–1.00)
Globulin, Total: 2.6 g/dL (ref 1.5–4.5)
Glucose: 110 mg/dL — ABNORMAL HIGH (ref 70–99)
Potassium: 4.7 mmol/L (ref 3.5–5.2)
Sodium: 147 mmol/L — ABNORMAL HIGH (ref 134–144)
Total Protein: 6.7 g/dL (ref 6.0–8.5)
eGFR: 51 mL/min/{1.73_m2} — ABNORMAL LOW (ref >59)

## 2023-09-29 MED ORDER — DONEPEZIL HCL 5 MG PO TABS: 10.0000 mg | ORAL_TABLET | Freq: Every day | ORAL | 5 refills | Status: DC

## 2023-09-29 NOTE — Progress Notes (Signed)
 [facility] [address] [place]. [place] [address] [phone]       OFFICE CONSULT NOTE  Sophia [name] Date of Birth:  1931-12-15 Medical Record Number:  [id number]   Referring MD:  Jearld Min  Reason for Referral:  Stroke  HPI: Sophia [name] is a pleasant 89 year old Caucasian lady seen today for initial office consultation visit for stroke.  History is obtained from them and review of electronic medical records.  I personally reviewed pertinent available imaging films in PACS.  She has past medical history of diabetes, hypertension, hyperlipidemia, hypothyroidism and remote bleeding peptic ulcer.  She presented on 08/13/2023 for evaluation for sudden onset of left-sided weakness.  She was fine the night before when she went to bed but she woke up at 2:30 in the morning to use the bathroom.  She has noticed she had difficulty using her left hand and dropping things as well as she feels she may have passed out.  The daughter found her lying on the floor and she had hurt herself a bit.  Patient was conscious and needed help to get up and was able to walk with assistance.  Next morning day noticed that she was dragging her left leg and left hand was weak.  CT head showed no acute abnormality but MRI scan confirmed patchy right MCA infarct and CT angiogram showed severe distal M2 branch stenosis on the right.  Echocardiogram showed ejection fraction of 65 to 70% with normal left atrial size.  LDL cholesterol 86 mg percent and hemoglobin A1c was 5.8.  Telemetry monitoring did not reveal any significant arrhythmias.  Patient was placed on dual antiplatelet therapy aspirin  and Plavix  and states that her gait and balance have improved.  She still has some diminished fine motor skills in the left hand but she is doing therapy which seems to be helping.  She does bruise easily on aspirin  and Plavix  but has had no bleeding so far.  She is tolerating Zocor  well without muscle  aches and pains.  She has not had any recurrent stroke or TIA symptoms.  The patient's daughter admits that she has noticed worsening of her short-term memory since her stroke.  She did have some mild cognitive impairment and memory difficulties even prior to the stroke which have now gotten worse.  Patient lives with her daughter.  She is mostly independent in actives of daily living.  She has been started on Aricept  5 mg daily for the last 3 months and is tolerating it well without any side effects.  She denies any history of cardiac arrhythmias, palpitations, fainting episodes. 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   Chronic kidney disease    Diabetes mellitus without complication (HCC)    GERD (gastroesophageal reflux disease)    Hyperlipidemia    Hypertension    Hypothyroidism     Social History:  Social History   Socioeconomic History   Marital status: Widowed    Spouse name: Not on file   Number of children: Not on file   Years of education: Not on file   Highest education level: Not on file  Occupational History   Not on file  Tobacco Use   Smoking status: Never   Smokeless tobacco: Never  Vaping Use   Vaping status:  Never Used  Substance and Sexual Activity   Alcohol use: No   Drug use: No   Sexual activity: Not Currently  Other Topics Concern   Not on file  Social History Narrative   Not on file   Social Drivers of Health   Financial Resource Strain: Not on file  Food Insecurity: No Food Insecurity (08/14/2023)   Hunger Vital Sign    Worried About Running Out of Food in the Last Year: Never true    Ran Out of Food in the Last Year: Never true  Transportation Needs: No Transportation Needs (08/14/2023)   PRAPARE - Administrator, Civil Service (Medical): No     Lack of Transportation (Non-Medical): No  Physical Activity: Not on file  Stress: Not on file  Social Connections: Socially Isolated (08/14/2023)   Social Connection and Isolation Panel [NHANES]    Frequency of Communication with Friends and Family: Never    Frequency of Social Gatherings with Friends and Family: Never    Attends Religious Services: Never    Database administrator or Organizations: Yes    Attends Banker Meetings: 1 to 4 times per year    Marital Status: Widowed  Intimate Partner Violence: Not At Risk (08/14/2023)   Humiliation, Afraid, Rape, and Kick questionnaire    Fear of Current or Ex-Partner: No    Emotionally Abused: No    Physically Abused: No    Sexually Abused: No    Medications:   Current Outpatient Medications on File Prior to Visit  Medication Sig Dispense Refill   amLODipine  (NORVASC ) 10 MG tablet Take 1 tablet (10 mg total) by mouth daily. 90 tablet 1   aspirin  EC 81 MG tablet Take 1 tablet (81 mg total) by mouth daily. Swallow whole. 30 tablet 12   Calcium  Carbonate Antacid (TUMS PO) Take by mouth.     carvedilol  (COREG ) 12.5 MG tablet Take 1.5 tablets (18.75 mg total) by mouth 2 (two) times daily with a meal. 90 tablet 1   Cholecalciferol (DIALYVITE VITAMIN D  5000) 125 MCG (5000 UT) capsule Take 5,000 Units by mouth daily.     clopidogrel  (PLAVIX ) 75 MG tablet Take 1 tablet (75 mg total) by mouth daily. 90 tablet 0   donepezil  (ARICEPT ) 5 MG tablet TAKE 1 TABLET BY MOUTH AT BEDTIME 30 tablet 1   ferrous sulfate  325 (65 FE) MG tablet Take 1 tablet (325 mg total) by mouth daily with breakfast.     levothyroxine  (SYNTHROID ) 100 MCG tablet TAKE 1 TABLET BY MOUTH ONCE DAILY. NEEDS LABWORK. (Patient taking differently: Take 100 mcg by mouth daily before breakfast.) 90 tablet 3   Magnesium  250 MG TABS Take 250 mg by mouth daily.     olmesartan  (BENICAR ) 40 MG tablet Take 1 tablet (40 mg total) by mouth daily. 90 tablet 0   risedronate  (ACTONEL )  150 MG tablet Take 1 tablet (150 mg total) by mouth every 30 (thirty) days. with water, on an empty stomach, take nothing by mouth and to not lie down for 30 minutes after each dose. 3 tablet 0   simvastatin  (ZOCOR ) 40 MG tablet TAKE 1/2 (ONE-HALF) TABLET BY MOUTH ONCE DAILY AT  6  PM (Patient taking differently: Take 20 mg by mouth daily at 6 PM. TAKE 1/2 (ONE-HALF) TABLET BY MOUTH ONCE DAILY AT  6  PM) 45 tablet 0   vitamin B-12 (CYANOCOBALAMIN) 1000 MCG tablet Take 1,000 mcg by mouth daily.     Calcium -Magnesium   500-250 MG TABS Take 1 tablet by mouth daily with supper. (Patient not taking: Reported on 09/29/2023) 30 each 5   omeprazole  (PRILOSEC) 40 MG capsule TAKE 1 CAPSULE ON AN EMPTY STOMACH 1 HOUR BEFORE BREAKFAST AND ANOTHER AT BEDTIME (2 HOURS AFTER YOUR LAST FOOD AND DRINK EXCEPT WATER) (Patient not taking: Reported on 09/29/2023) 180 capsule 0   No current facility-administered medications on file prior to visit.    Allergies:  No Known Allergies  Physical Exam General: well developed, well nourished, seated, in no evident distress Head: head normocephalic and atraumatic.   Neck: supple with no carotid or supraclavicular bruits Cardiovascular: regular rate and rhythm, no murmurs Musculoskeletal: no deformity Skin:  no rash/petichiae Vascular:  Normal pulses all extremities  Neurologic Exam Mental Status: Awake and fully alert. Oriented to place and time. Recent and remote memory intact. Attention span, concentration and fund of knowledge appropriate. Mood and affect appropriate.  MMSE score 24/30.  Diminished recall 1/3.  Able to name only 9 animals which can walk on 4 legs.  Clock drawing 4/4.  Functional activity questionnaire she scored 9 suggesting mild dependence Cranial Nerves: Fundoscopic exam reveals sharp disc margins. Pupils equal, briskly reactive to light. Extraocular movements full without nystagmus. Visual fields full to confrontation. Hearing intact. Facial sensation  intact. Face, tongue, palate moves normally and symmetrically.  Motor: Normal bulk and tone. Normal strength in all tested extremity muscles.  Diminished fine finger movements on the left.  Orbits right over left upper extremity.  Trace left grip weakness.  Mild left ankle dorsiflexor weakness. Sensory.: intact to touch , pinprick , position and vibratory sensation.  Coordination: Rapid alternating movements normal in all extremities. Finger-to-nose and heel-to-shin performed accurately bilaterally. Gait and Station: Arises from chair with mild difficulty. Stance is normal. Gait cautious and slightly broad-based and favoring the left leg.  Unable to do tandem walking. Reflexes: 1+ and symmetric. Toes downgoing.   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  She also has mild mixed vascular and Alzheimer's dementia.     PLAN:I had a long d/w patient and her daughter about her recent embolic stroke, intracranial stenosis, vascular dementia and risk for recurrent stroke/TIAs, personally independently reviewed imaging studies and stroke evaluation results and answered questions.Continue aspirin  and Plavix  for 6 more weeks and then stop Plavix  and stay on aspirin  alone for secondary stroke prevention and maintain strict control of hypertension with blood pressure goal below 130/90, diabetes with hemoglobin A1c goal below 6.5% and lipids with LDL cholesterol goal below 70 mg/dL. I also advised the patient to eat a healthy diet with plenty  of whole grains, cereals, fruits and vegetables, exercise regularly and maintain ideal body weight.  I recommend she continue ongoing physical occupational therapy and use her cane at all times.  We also discussed fall prevention precautions.  I also encouraged her to increase the dose of Aricept  to 10 mg daily and to increase  participation in cognitively challenging activities like solving crossword puzzles, playing bridge and sudoku.  We also discussed memory compensation strategies.  Followup in the future with me in 6 months or call earlier if necessary. Greater than 50% time during this 60-minute consultation was spent on counseling and coordination of care about her embolic stroke and dementia and discussion with patient and daughter and answering questions. Sophia Beaver, MD Note: This document was prepared with digital dictation and possible smart phrase technology. Any transcriptional errors that result from this process are unintentional.

## 2023-09-29 NOTE — Progress Notes (Signed)
Hello Sophia [name],  Your lab result is normal and/or stable.Some minor variations that are not significant are commonly marked abnormal, but do not represent any medical problem for you.  Best regards, Lariza Cothron, M.D.

## 2023-09-29 NOTE — Patient Instructions (Addendum)
 I had a long d/w patient and her daughter about her recent embolic stroke, intracranial stenosis, vascular dementia and risk for recurrent stroke/TIAs, personally independently reviewed imaging studies and stroke evaluation results and answered questions.Continue aspirin  and Plavix  for 6 more weeks and then stop Plavix  and stay on aspirin  alone for secondary stroke prevention and maintain strict control of hypertension with blood pressure goal below 130/90, diabetes with hemoglobin A1c goal below 6.5% and lipids with LDL cholesterol goal below 70 mg/dL. I also advised the patient to eat a healthy diet with plenty of whole grains, cereals, fruits and vegetables, exercise regularly and maintain ideal body weight.  I recommend she continue ongoing physical occupational therapy and use her cane at all times.  We also discussed fall prevention precautions.  I also encouraged her to increase the dose of Aricept  to 10 mg daily and to increase participation in cognitively challenging activities like solving crossword puzzles, playing bridge and sudoku.  We also discussed memory compensation strategies.  Followup in the future with me in 6 months or call earlier if necessary. Stroke Prevention Some medical conditions and behaviors can lead to a higher chance of having a stroke. You can help prevent a stroke by eating healthy, exercising, not smoking, and managing any medical conditions you have. Stroke is a leading cause of functional impairment. Primary prevention is particularly important because a majority of strokes are first-time events. Stroke changes the lives of not only those who experience a stroke but also their family and other caregivers. How can this condition affect me? A stroke is a medical emergency and should be treated right away. A stroke can lead to brain damage and can sometimes be life-threatening. If a person gets medical treatment right away, there is a better chance of surviving and recovering  from a stroke. What can increase my risk? The following medical conditions may increase your risk of a stroke: Cardiovascular disease. High blood pressure (hypertension). Diabetes. High cholesterol. Sickle cell disease. Blood clotting disorders (hypercoagulable state). Obesity. Sleep disorders (obstructive sleep apnea). Other risk factors include: Being older than age 79. Having a history of blood clots, stroke, or mini-stroke (transient ischemic attack, TIA). Genetic factors, such as race, ethnicity, or a family history of stroke. Smoking cigarettes or using other tobacco products. Taking birth control pills, especially if you also use tobacco. Heavy use of alcohol or drugs, especially cocaine and methamphetamine. Physical inactivity. What actions can I take to prevent this? Manage your health conditions High cholesterol levels. Eating a healthy diet is important for preventing high cholesterol. If cholesterol cannot be managed through diet alone, you may need to take medicines. Take any prescribed medicines to control your cholesterol as told by your health care provider. Hypertension. To reduce your risk of stroke, try to keep your blood pressure below 130/80. Eating a healthy diet and exercising regularly are important for controlling blood pressure. If these steps are not enough to manage your blood pressure, you may need to take medicines. Take any prescribed medicines to control hypertension as told by your health care provider. Ask your health care provider if you should monitor your blood pressure at home. Have your blood pressure checked every year, even if your blood pressure is normal. Blood pressure increases with age and some medical conditions. Diabetes. Eating a healthy diet and exercising regularly are important parts of managing your blood sugar (glucose). If your blood sugar cannot be managed through diet and exercise, you may need to take medicines. Take any  prescribed medicines to control your diabetes as told by your health care provider. Get evaluated for obstructive sleep apnea. Talk to your health care provider about getting a sleep evaluation if you snore a lot or have excessive sleepiness. Make sure that any other medical conditions you have, such as atrial fibrillation or atherosclerosis, are managed. Nutrition Follow instructions from your health care provider about what to eat or drink to help manage your health condition. These instructions may include: Reducing your daily calorie intake. Limiting how much salt (sodium) you use to 1,500 milligrams (mg) each day. Using only healthy fats for cooking, such as olive oil, canola oil, or sunflower oil. Eating healthy foods. You can do this by: Choosing foods that are high in fiber, such as whole grains, and fresh fruits and vegetables. Eating at least 5 servings of fruits and vegetables a day. Try to fill one-half of your plate with fruits and vegetables at each meal. Choosing lean protein foods, such as lean cuts of meat, poultry without skin, fish, tofu, beans, and nuts. Eating low-fat dairy products. Avoiding foods that are high in sodium. This can help lower blood pressure. Avoiding foods that have saturated fat, trans fat, and cholesterol. This can help prevent high cholesterol. Avoiding processed and prepared foods. Counting your daily carbohydrate intake.  Lifestyle If you drink alcohol: Limit how much you have to: 0-1 drink a day for women who are not pregnant. 0-2 drinks a day for men. Know how much alcohol is in your drink. In the [place] one drink equals one 12 oz bottle of beer ( ), one 5 oz glass of wine ( ), or one 1 oz glass of hard liquor (44mL). Do not use any products that contain nicotine or tobacco. These products include cigarettes, chewing tobacco, and vaping devices, such as e-cigarettes. If you need help quitting, ask your health care provider. Avoid  secondhand smoke. Do not use drugs. Activity  Try to stay at a healthy weight. Get at least 30 minutes of exercise on most days, such as: Fast walking. Biking. Swimming. Medicines Take over-the-counter and prescription medicines only as told by your health care provider. Aspirin  or blood thinners (antiplatelets or anticoagulants) may be recommended to reduce your risk of forming blood clots that can lead to stroke. Avoid taking birth control pills. Talk to your health care provider about the risks of taking birth control pills if: You are over 53 years old. You smoke. You get very bad headaches. You have had a blood clot. Where to find more information American Stroke Association: www.strokeassociation.org Get help right away if: You or a loved one has any symptoms of a stroke. "BE FAST" is an easy way to remember the main warning signs of a stroke: B - Balance. Signs are dizziness, sudden trouble walking, or loss of balance. E - Eyes. Signs are trouble seeing or a sudden change in vision. F - Face. Signs are sudden weakness or numbness of the face, or the face or eyelid drooping on one side. A - Arms. Signs are weakness or numbness in an arm. This happens suddenly and usually on one side of the body. S - Speech. Signs are sudden trouble speaking, slurred speech, or trouble understanding what people say. T - Time. Time to call emergency services. Write down what time symptoms started. You or a loved one has other signs of a stroke, such as: A sudden, severe headache with no known cause. Nausea or vomiting. Seizure. These symptoms may represent a serious problem  that is an emergency. Do not wait to see if the symptoms will go away. Get medical help right away. Call your local emergency services (911 in the [place] Do not drive yourself to the hospital. Summary You can help to prevent a stroke by eating healthy, exercising, not smoking, limiting alcohol intake, and managing any medical  conditions you may have. Do not use any products that contain nicotine or tobacco. These include cigarettes, chewing tobacco, and vaping devices, such as e-cigarettes. If you need help quitting, ask your health care provider. Remember "BE FAST" for warning signs of a stroke. Get help right away if you or a loved one has any of these signs. This information is not intended to replace advice given to you by your health care provider. Make sure you discuss any questions you have with your health care provider. Document Revised: 04/06/2022 Document Reviewed: 04/06/2022 Elsevier Patient Education  2024 Elsevier Inc.weight .she was advised to use a cane at all times and we discussed fall prevention precautions.  Recommend 30-day heart monitor for paroxysmal A-fib.  Continue ongoing physical and occupational therapy.  Increase dose of Aricept  to 10 mg daily if tolerated.  I also advised her to increase participation in cognitively challenging activities like solving crossword puzzles, playing bridge and sudoku.  We also discussed memory compensation strategies.  Followup in the future with me in future in 6 months or call earlier if necessary.  Memory Compensation Strategies  Use "WARM" strategy.  W= write it down  A= associate it  R= repeat it  M= make a mental note  2.   You can keep a Glass blower/designer.  Use a 3-ring notebook with sections for the following: calendar, important names and phone numbers,  medications, doctors' names/phone numbers, lists/reminders, and a section to journal what you did  each day.   3.    Use a calendar to write appointments down.  4.    Write yourself a schedule for the day.  This can be placed on the calendar or in a separate section of the Memory Notebook.  Keeping a  regular schedule can help memory.  5.    Use medication organizer with sections for each day or morning/evening pills.  You may need help loading it  6.    Keep a basket, or pegboard by the  door.  Place items that you need to take out with you in the basket or on the pegboard.  You may also want to  include a message board for reminders.  7.    Use sticky notes.  Place sticky notes with reminders in a place where the task is performed.  For example: " turn off the  stove" placed by the stove, "lock the door" placed on the door at eye level, " take your medications" on  the bathroom mirror or by the place where you normally take your medications.  8.    Use alarms/timers.  Use while cooking to remind yourself to check on food or as a reminder to take your medicine, or as a  reminder to make a call, or as a reminder to perform another task, etc.

## 2023-09-30 ENCOUNTER — Other Ambulatory Visit: Payer: Self-pay | Admitting: Neurology

## 2023-09-30 ENCOUNTER — Other Ambulatory Visit (HOSPITAL_COMMUNITY): Payer: Self-pay

## 2023-09-30 ENCOUNTER — Telehealth: Payer: Self-pay | Admitting: Pharmacist

## 2023-09-30 DIAGNOSIS — I679 Cerebrovascular disease, unspecified: Secondary | ICD-10-CM

## 2023-09-30 DIAGNOSIS — I63411 Cerebral infarction due to embolism of right middle cerebral artery: Secondary | ICD-10-CM

## 2023-09-30 DIAGNOSIS — G3184 Mild cognitive impairment, so stated: Secondary | ICD-10-CM

## 2023-09-30 DIAGNOSIS — I639 Cerebral infarction, unspecified: Secondary | ICD-10-CM

## 2023-09-30 NOTE — Telephone Encounter (Signed)
 Insurance will cover 10mg  tablets, per pharmacy,script has been changed and filled.

## 2023-10-01 NOTE — Telephone Encounter (Signed)
 Verbal orders given on vm since unable to get in touch. 3rd attempt. LS

## 2023-10-05 ENCOUNTER — Telehealth: Payer: Self-pay | Admitting: Neurology

## 2023-10-05 NOTE — Telephone Encounter (Signed)
 Pt's daughter is asking for a call from RN to discuss logging pt's medication for the wearing of the heart monitor ordered by Dr Janett Medin, please call her.

## 2023-10-05 NOTE — Telephone Encounter (Signed)
 Called the daughter. She had some questions about the cardiac monitor. I reviewed with her that I don't think she has to write down all of her daily medications but that she should just note what time she takes meds each day normally. I did have her contact the people that set her up with the cardiac monitor and confirm with them.

## 2023-10-12 ENCOUNTER — Ambulatory Visit (HOSPITAL_COMMUNITY)
Admission: RE | Admit: 2023-10-12 | Discharge: 2023-10-12 | Disposition: A | Discharge status: Home / Self Care | Source: Ambulatory Visit | Attending: Internal Medicine | Admitting: Internal Medicine

## 2023-10-12 ENCOUNTER — Telehealth: Payer: Self-pay

## 2023-10-12 VITALS — BP 158/66 | HR 61 | Ht 62.0 in | Wt 120.0 lb

## 2023-10-12 DIAGNOSIS — I480 Paroxysmal atrial fibrillation: Secondary | ICD-10-CM

## 2023-10-12 DIAGNOSIS — I4891 Unspecified atrial fibrillation: Secondary | ICD-10-CM

## 2023-10-12 DIAGNOSIS — D6869 Other thrombophilia: Secondary | ICD-10-CM

## 2023-10-12 MED ORDER — APIXABAN 2.5 MG PO TABS: 2.5000 mg | ORAL_TABLET | Freq: Two times a day (BID) | ORAL | 3 refills | Status: DC

## 2023-10-12 NOTE — Patient Instructions (Signed)
 Stop Aspirin  and Plavix   Start Eliquis 2.5 mg twice a day

## 2023-10-12 NOTE — Telephone Encounter (Signed)
   Cardiac Monitor Alert  Date of alert:  10/12/2023   Patient Name: Sophia [name]  DOB: February 12, 1932  MRN: [id number]   [facility] Cardiologist: None   HeartCare EP:  None    Monitor Information: Cardiac Event Monitor [[employer]]  Reason:  CVA  Ordering provider:  Ronnell Coins MD with [facility]   Alert Atrial Fibrillation/Flutter This is the 1st alert for this rhythm.  The patient has no hx of Atrial Fibrillation/Flutter.  The patient is not currently on anticoagulation.  Next Cardiology Appointment   Date:    Provider:    The patient was contacted today.  She is asymptomatic. Arrhythmia, symptoms and history reviewed with Dr. Micael Adas (DOD).  Plan:  Dr. Micael Adas wants patient to be seen in A. FIB clinic today.   Other:  August 13, 2023 Antonetta Kitchen, RN  10/12/2023 8:49 AM

## 2023-10-12 NOTE — Progress Notes (Signed)
 Primary Care Physician: Roise Cleaver, MD Primary Cardiologist: None Electrophysiologist: None     Referring Physician: Dr. Asuncion Layer Cozza is a 89 y.o. female with a history of T2DM, HTN, CVA, HLD, hypothyroidism, and atrial fibrillation who presents for consultation in the [facility]. Cardiac event monitor placed by Dr. Janett Medin on 5/14 showed new onset Afib on 5/25. Patient has a CHADS2VASC score of 7.  On evaluation today, she is currently in NSR. She is still currently wearing cardiac monitor. She did not have cardiac awareness when she received phone call about arrhythmia.   Today, she denies symptoms of palpitations, chest pain, shortness of breath, orthopnea, PND, lower extremity edema, dizziness, presyncope, syncope, snoring, daytime somnolence, bleeding, or neurologic sequela. The patient is tolerating medications without difficulties and is otherwise without complaint today.   she has a BMI of Body mass index is 21.95 kg/m.Aaron Aas Filed Weights   10/12/23 1447  Weight: 54.4 kg    Current Outpatient Medications  Medication Sig Dispense Refill   amLODipine  (NORVASC ) 10 MG tablet Take 1 tablet (10 mg total) by mouth daily. 90 tablet 1   apixaban (ELIQUIS) 2.5 MG TABS tablet Take 1 tablet (2.5 mg total) by mouth 2 (two) times daily. 60 tablet 3   Calcium  Carbonate Antacid (TUMS PO) Take 1 tablet by mouth as needed.     carvedilol  (COREG ) 12.5 MG tablet Take 1.5 tablets (18.75 mg total) by mouth 2 (two) times daily with a meal. 90 tablet 1   Cholecalciferol (DIALYVITE VITAMIN D  5000) 125 MCG (5000 UT) capsule Take 5,000 Units by mouth daily.     donepezil  (ARICEPT ) 5 MG tablet Take 2 tablets (10 mg total) by mouth at bedtime. 60 tablet 5   ferrous sulfate  325 (65 FE) MG tablet Take 1 tablet (325 mg total) by mouth daily with breakfast.     Lansoprazole (PREVACID PO) Take 15 mg by mouth 2 (two) times daily.     levothyroxine  (SYNTHROID ) 100  MCG tablet TAKE 1 TABLET BY MOUTH ONCE DAILY. NEEDS LABWORK. (Patient taking differently: Take 100 mcg by mouth daily before breakfast.) 90 tablet 3   Magnesium  250 MG TABS Take 250 mg by mouth daily.     olmesartan  (BENICAR ) 40 MG tablet Take 1 tablet (40 mg total) by mouth daily. 90 tablet 0   risedronate  (ACTONEL ) 150 MG tablet Take 1 tablet (150 mg total) by mouth every 30 (thirty) days. with water, on an empty stomach, take nothing by mouth and to not lie down for 30 minutes after each dose. 3 tablet 0   simvastatin  (ZOCOR ) 40 MG tablet TAKE 1/2 (ONE-HALF) TABLET BY MOUTH ONCE DAILY AT  6  PM (Patient taking differently: Take 20 mg by mouth daily at 6 PM. TAKE 1/2 (ONE-HALF) TABLET BY MOUTH ONCE DAILY AT  6  PM) 45 tablet 0   vitamin B-12 (CYANOCOBALAMIN) 1000 MCG tablet Take 1,000 mcg by mouth daily.     omeprazole  (PRILOSEC) 40 MG capsule TAKE 1 CAPSULE ON AN EMPTY STOMACH 1 HOUR BEFORE BREAKFAST AND ANOTHER AT BEDTIME (2 HOURS AFTER YOUR LAST FOOD AND DRINK EXCEPT WATER) (Patient not taking: Reported on 10/12/2023) 180 capsule 0   No current facility-administered medications for this encounter.    Atrial Fibrillation Management history:  Previous antiarrhythmic drugs: none Previous cardioversions: none Previous ablations: none Anticoagulation history: none   ROS- All systems are reviewed and negative except as per the HPI above.  Physical Exam: BP (!) 158/66   Pulse 61   Ht 5\' 2"  (1.575 m)   Wt 54.4 kg   BMI 21.95 kg/m   GEN: Well nourished, well developed in no acute distress NECK: No JVD; No carotid bruits CARDIAC: Irregularly irregular rate and rhythm, no murmurs, rubs, gallops RESPIRATORY:  Clear to auscultation without rales, wheezing or rhonchi  ABDOMEN: Soft, non-tender, non-distended EXTREMITIES:  No edema; No deformity   EKG today demonstrates  Vent. rate 61 BPM PR interval * ms QRS duration 82 ms QT/QTcB 394/396 ms P-R-T axes * 37 58 NSR Abnormal ECG When  compared with ECG of 13-Aug-2023 22:25, PREVIOUS ECG IS PRESENT  Echo 08/14/23 demonstrated   1. Left ventricular ejection fraction, by estimation, is 65 to 70%. The  left ventricle has normal function. Left ventricular endocardial border  not optimally defined to evaluate regional wall motion. Left ventricular  diastolic parameters are consistent  with Grade II diastolic dysfunction (pseudonormalization).   2. Right ventricular systolic function is normal. The right ventricular  size is normal. Tricuspid regurgitation signal is inadequate for assessing  PA pressure.   3. Left atrial size was mildly dilated.   4. The mitral valve is degenerative. Trivial mitral valve regurgitation.  No evidence of mitral stenosis.   5. The aortic valve is grossly normal. There is mild calcification of the  aortic valve. Aortic valve regurgitation is not visualized. Aortic valve  sclerosis/calcification is present, without any evidence of aortic  stenosis.   6. IVC not well visualized.. The inferior vena cava is normal in size  with greater than 50% respiratory variability, suggesting right atrial  pressure of 3 mmHg.    ASSESSMENT & PLAN CHA2DS2-VASc Score = 7  The patient's score is based upon: CHF History: 0 HTN History: 1 Diabetes History: 1 Stroke History: 2 Vascular Disease History: 0 Age Score: 2 Gender Score: 1       ASSESSMENT AND PLAN: Paroxysmal Atrial Fibrillation (ICD10:  I48.0) The patient's CHA2DS2-VASc score is 7, indicating a 11.2% annual risk of stroke.    She is currently in NSR. Continue wearing cardiac monitor for rhythm burden assessment. Will have patient follow up in 1 month to review monitor results.   Secondary Hypercoagulable State (ICD10:  D68.69) The patient is at significant risk for stroke/thromboembolism based upon her CHA2DS2-VASc Score of 7.  Start Apixaban (Eliquis).   After discussion with primary neurologist Dr. Janett Medin, okay to transition from dual  antiplatelet therapy to only Eliquis. We discussed risks vs benefits of anticoagulation for stroke prevention related to Afib. After discussion, patient elects to begin OAC. Due to age and weight below 60 kg, will begin Eliquis 2.5 mg BID. Will require CBC at next office visit.    Follow up ~1 month Afib clinic.    Minnie Amber, PA-C  Afib Clinic [facility] [address] [place], [place] [address] [phone]

## 2023-10-15 ENCOUNTER — Telehealth: Payer: Self-pay

## 2023-10-15 NOTE — Telephone Encounter (Signed)
 VO for [facility] given.

## 2023-10-15 NOTE — Telephone Encounter (Signed)
 Copied from CRM [id number]. Topic: Clinical - Home Health Verbal Orders >> Oct 15, 2023 10:53 AM Felizardo Hotter wrote: Caller/Agency: [facility] per Nani Baba Number: [phone] secure Line Service Requested: Occupational Therapy Frequency: 1x week for 3 weeks Any new concerns about the patient? No

## 2023-10-18 ENCOUNTER — Encounter: Payer: Self-pay | Admitting: Cardiovascular Disease

## 2023-10-20 ENCOUNTER — Ambulatory Visit: Admitting: Family Medicine

## 2023-10-26 ENCOUNTER — Ambulatory Visit: Admitting: Family Medicine

## 2023-10-26 ENCOUNTER — Encounter: Payer: Self-pay | Admitting: Family Medicine

## 2023-10-26 ENCOUNTER — Ambulatory Visit (INDEPENDENT_AMBULATORY_CARE_PROVIDER_SITE_OTHER): Admitting: Family Medicine

## 2023-10-26 VITALS — BP 117/55 | HR 60 | Temp 97.4°F | Ht 62.0 in | Wt 121.0 lb

## 2023-10-26 DIAGNOSIS — I10 Essential (primary) hypertension: Secondary | ICD-10-CM

## 2023-10-26 DIAGNOSIS — E119 Type 2 diabetes mellitus without complications: Secondary | ICD-10-CM

## 2023-10-26 DIAGNOSIS — I480 Paroxysmal atrial fibrillation: Secondary | ICD-10-CM

## 2023-10-26 NOTE — Progress Notes (Signed)
 Subjective:  Patient ID: Sophia [name], female    DOB: 1932/02/05  Age: 89 y.o. MRN: [id number]  CC: Atrial Fibrillation (Afib shown on heart monitor. Seeing cardiologist. Feels good overall though. Monitor is on for 10 more days. Seeing cardiologist again on 30th.) and Medication Problem (Omeprazole  showed reaction to eliquis  by pharmacy. Is it okay to continue or need to take prevacid as directed. )   HPI Sophia [name] presents for new onset A. Fib. Switched to eliquis .  Instead of aspirin  Plavix  which she has been taking since her recent stroke.  Someone has told her that she cannot take omeprazole  with Eliquis .  She continues to have symptoms of reflux when she does not take the omeprazole .  HistoryShirley has a past medical history of Cancer (HCC), Chronic kidney disease, Diabetes mellitus without complication (HCC), GERD (gastroesophageal reflux disease), Hyperlipidemia, Hypertension, and Hypothyroidism.   She has a past surgical history that includes Cholecystectomy; Sigmoidoscopy; Esophagogastroduodenoscopy (N/A, 04/29/2015); Eye surgery (Right, 01/19/2019); Colonoscopy; Excision basal cell carcinoma (N/A, 05/21/2020); and Adjacent tissue transfer/tissue rearrangement (N/A, 05/21/2020).   Her family history includes Colon cancer in her sister; Heart disease in her brother, father, and sister.She reports that she has never smoked. She has never used smokeless tobacco. She reports that she does not drink alcohol and does not use drugs.    ROS Review of Systems  Constitutional: Negative.   HENT: Negative.    Eyes:  Negative for visual disturbance.  Respiratory:  Negative for shortness of breath.   Cardiovascular:  Negative for chest pain.  Gastrointestinal:  Negative for abdominal pain.  Musculoskeletal:  Negative for arthralgias.    Objective:  BP (!) 117/55   Pulse 60   Temp (!) 97.4 F (36.3 C)   Ht 5' 2 (1.575 m)   Wt 121 lb (54.9 kg)   SpO2 97%   BMI 22.13  kg/m   BP Readings from Last 3 Encounters:  10/27/23 (!) 117/55  10/26/23 (!) 117/55  10/12/23 (!) 158/66    Wt Readings from Last 3 Encounters:  10/27/23 121 lb (54.9 kg)  10/26/23 121 lb (54.9 kg)  10/12/23 120 lb (54.4 kg)     Physical Exam Constitutional:      General: She is not in acute distress.    Appearance: She is well-developed.   Cardiovascular:     Rate and Rhythm: Normal rate and regular rhythm.  Pulmonary:     Breath sounds: Normal breath sounds.   Musculoskeletal:        General: Normal range of motion.   Skin:    General: Skin is warm and dry.   Neurological:     Mental Status: She is alert and oriented to person, place, and time.     Results for orders placed or performed in visit on 09/28/23  [id number]+EGFR   Collection Time: 09/28/23  9:57 AM  Result Value Ref Range   Glucose 110 (H) 70 - 99 mg/dL   BUN 20 8 - 27 mg/dL   Creatinine, Ser 7.84 (H) 0.57 - 1.00 mg/dL   eGFR 51 (L) >69 GE/XBM/8.41   BUN/Creatinine Ratio 19 12 - 28   Sodium 147 (H) 134 - 144 mmol/L   Potassium 4.7 3.5 - 5.2 mmol/L   Chloride 108 (H) 96 - 106 mmol/L   CO2 22 20 - 29 mmol/L   Calcium  9.6 8.7 - 10.3 mg/dL   Total Protein 6.7 6.0 - 8.5 g/dL   Albumin 4.1 3.7 - 4.7 g/dL  Globulin, Total 2.6 1.5 - 4.5 g/dL   Bilirubin Total 0.4 0.0 - 1.2 mg/dL   Alkaline Phosphatase 68 44 - 121 IU/L   AST 18 0 - 40 IU/L   ALT 10 0 - 32 IU/L  CBC with Differential/Platelet   Collection Time: 09/28/23  9:57 AM  Result Value Ref Range   WBC 8.3 3.4 - 10.8 x10E3/uL   RBC 3.64 (L) 3.77 - 5.28 x10E6/uL   Hemoglobin 12.2 11.1 - 15.9 g/dL   Hematocrit 16.1 09.6 - 46.6 %   MCV 104 (H) 79 - 97 fL   MCH 33.5 (H) 26.6 - 33.0 pg   MCHC 32.1 31.5 - 35.7 g/dL   RDW 04.5 40.9 - 81.1 %   Platelets 304 150 - 450 x10E3/uL   Neutrophils 70 Not Estab. %   Lymphs 18 Not Estab. %   Monocytes 7 Not Estab. %   Eos 4 Not Estab. %   Basos 1 Not Estab. %   Neutrophils Absolute 5.8 1.4 - 7.0  x10E3/uL   Lymphocytes Absolute 1.5 0.7 - 3.1 x10E3/uL   Monocytes Absolute 0.6 0.1 - 0.9 x10E3/uL   EOS (ABSOLUTE) 0.4 0.0 - 0.4 x10E3/uL   Basophils Absolute 0.1 0.0 - 0.2 x10E3/uL   Immature Granulocytes 0 Not Estab. %   Immature Grans (Abs) 0.0 0.0 - 0.1 x10E3/uL  TSH + free T4   Collection Time: 09/28/23  9:57 AM  Result Value Ref Range   TSH 0.225 (L) 0.450 - 4.500 uIU/mL   Free T4 1.72 0.82 - 1.77 ng/dL    Assessment & Plan:  Paroxysmal atrial fibrillation (HCC)  Diabetes mellitus without complication (HCC)  Essential (primary) hypertension      Interaction check performed using Epocrates.  No interactions were highlighted.  However, Prevacid is thought to be somewhat stronger with regard to reducing stomach acid.  As result we will go ahead and have her continue Prevacid when given as otherwise equal indication. Follow-up: Return in about 6 weeks (around 12/07/2023).  Sophia [name], M.D.

## 2023-10-27 ENCOUNTER — Ambulatory Visit

## 2023-10-27 VITALS — BP 117/55 | HR 60 | Ht 62.0 in | Wt 121.0 lb

## 2023-10-27 DIAGNOSIS — Z0000 Encounter for general adult medical examination without abnormal findings: Secondary | ICD-10-CM

## 2023-10-27 NOTE — Progress Notes (Signed)
 Subjective:   Sophia [name] is a 89 y.o. who presents for a Medicare Wellness preventive visit.  As a reminder, Annual Wellness Visits don't include a physical exam, and some assessments may be limited, especially if this visit is performed virtually. We may recommend an in-person follow-up visit with your provider if needed.  Visit Complete: Virtual I connected with  Auther Bo on 10/27/23 by a audio enabled telemedicine application and verified that I am speaking with the correct person using two identifiers.  Patient Location: Home  Provider Location: Home Office  I discussed the limitations of evaluation and management by telemedicine. The patient expressed understanding and agreed to proceed.  Vital Signs: Because this visit was a virtual/telehealth visit, some criteria may be missing or patient reported. Any vitals not documented were not able to be obtained and vitals that have been documented are patient reported.  VideoDeclined- This patient declined Librarian, academic. Therefore the visit was completed with audio only.  Persons Participating in Visit: Patient.  AWV Questionnaire: No: Patient Medicare AWV questionnaire was not completed prior to this visit.  Cardiac Risk Factors include: advanced age (>53men, >73 women);dyslipidemia;hypertension     Objective:     Today's Vitals   10/27/23 1334  BP: (!) 117/55  Pulse: 60  Weight: 121 lb (54.9 kg)  Height: 5' 2 (1.575 m)   Body mass index is 22.13 kg/m.     10/27/2023    1:46 PM 08/13/2023    9:49 PM 08/19/2022    3:31 PM 06/11/2020   12:41 PM 05/21/2020    9:55 AM 06/01/2016    5:33 AM 05/02/2015    7:00 AM  Advanced Directives  Does Patient Have a Medical Advance Directive? No No Yes No No No No  Type of Advance Directive   Living will;Healthcare Power of Attorney      Does patient want to make changes to medical advance directive?   No - Patient declined      Copy of  Healthcare Power of Attorney in Chart?   No - copy requested      Would patient like information on creating a medical advance directive?    No - Patient declined No - Patient declined No - Patient declined     Current Medications (verified) Outpatient Encounter Medications as of 10/27/2023  Medication Sig   amLODipine  (NORVASC ) 10 MG tablet Take 1 tablet (10 mg total) by mouth daily.   apixaban  (ELIQUIS ) 2.5 MG TABS tablet Take 1 tablet (2.5 mg total) by mouth 2 (two) times daily.   Calcium  Carbonate Antacid (TUMS PO) Take 1 tablet by mouth as needed.   carvedilol  (COREG ) 12.5 MG tablet Take 1.5 tablets (18.75 mg total) by mouth 2 (two) times daily with a meal.   Cholecalciferol (DIALYVITE VITAMIN D  5000) 125 MCG (5000 UT) capsule Take 5,000 Units by mouth daily.   donepezil  (ARICEPT ) 5 MG tablet Take 2 tablets (10 mg total) by mouth at bedtime.   ferrous sulfate  325 (65 FE) MG tablet Take 1 tablet (325 mg total) by mouth daily with breakfast.   Lansoprazole (PREVACID PO) Take 15 mg by mouth 2 (two) times daily.   levothyroxine  (SYNTHROID ) 100 MCG tablet TAKE 1 TABLET BY MOUTH ONCE DAILY. NEEDS LABWORK. (Patient taking differently: Take 100 mcg by mouth daily before breakfast.)   Magnesium  250 MG TABS Take 250 mg by mouth daily.   olmesartan  (BENICAR ) 40 MG tablet Take 1 tablet (40 mg total) by mouth daily.  omeprazole  (PRILOSEC) 40 MG capsule TAKE 1 CAPSULE ON AN EMPTY STOMACH 1 HOUR BEFORE BREAKFAST AND ANOTHER AT BEDTIME (2 HOURS AFTER YOUR LAST FOOD AND DRINK EXCEPT WATER)   risedronate  (ACTONEL ) 150 MG tablet Take 1 tablet (150 mg total) by mouth every 30 (thirty) days. with water, on an empty stomach, take nothing by mouth and to not lie down for 30 minutes after each dose.   simvastatin  (ZOCOR ) 40 MG tablet TAKE 1/2 (ONE-HALF) TABLET BY MOUTH ONCE DAILY AT  6  PM (Patient taking differently: Take 20 mg by mouth daily at 6 PM. TAKE 1/2 (ONE-HALF) TABLET BY MOUTH ONCE DAILY AT  6  PM)    vitamin B-12 (CYANOCOBALAMIN) 1000 MCG tablet Take 1,000 mcg by mouth daily.   No facility-administered encounter medications on file as of 10/27/2023.    Allergies (verified) Patient has no known allergies.   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   Chronic kidney disease    Diabetes mellitus without complication (HCC)    GERD (gastroesophageal reflux disease)    Hyperlipidemia    Hypertension    Hypothyroidism    Past Surgical History:  Procedure Laterality Date   ADJACENT TISSUE TRANSFER/TISSUE REARRANGEMENT N/A 05/21/2020   Procedure: closure with adjacent tissue transfer;  Surgeon: Barb Bonito, MD;  Location: [facility] OR;  Service: Plastics;  Laterality: N/A;   BASAL CELL CARCINOMA EXCISION N/A 05/21/2020   Procedure: excision of scalp squamous cell carcinoma with frozen sections;  Surgeon: Barb Bonito, MD;  Location: [facility] OR;  Service: Plastics;  Laterality: N/A;  1.5 hours total   CHOLECYSTECTOMY     COLONOSCOPY     ESOPHAGOGASTRODUODENOSCOPY N/A 04/29/2015   Procedure: ESOPHAGOGASTRODUODENOSCOPY (EGD);  Surgeon: Ozell Blunt, MD;  Location: [facility] ENDOSCOPY;  Service: Endoscopy;  Laterality: N/A;   EYE SURGERY Right 01/19/2019   blocked tear duct    SIGMOIDOSCOPY     Family History  Problem Relation Age of Onset   Heart disease Father    Colon cancer Sister    Heart disease Sister    Heart disease Brother    Stroke Neg Hx    Social History   Socioeconomic History   Marital status: Widowed    Spouse name: Not on file   Number of children: Not on file   Years of education: Not on file   Highest education level: Not on file  Occupational History   Not on file  Tobacco Use   Smoking status: Never   Smokeless tobacco: Never  Vaping Use   Vaping status: Never Used  Substance and Sexual Activity   Alcohol use: No   Drug use: No   Sexual activity: Not Currently  Other Topics Concern   Not on file  Social History  Narrative   Not on file   Social Drivers of Health   Financial Resource Strain: Low Risk  (10/27/2023)   Overall Financial Resource Strain (CARDIA)    Difficulty of Paying Living Expenses: Not hard at all  Food Insecurity: No Food Insecurity (10/27/2023)   Hunger Vital Sign    Worried About Running Out of Food in the Last Year: Never true    Ran Out of Food in the Last Year: Never true  Transportation Needs: No Transportation Needs (10/27/2023)   PRAPARE - Administrator, Civil Service (Medical): No    Lack of Transportation (Non-Medical): No  Physical Activity: Insufficiently Active (10/27/2023)  Exercise Vital Sign    Days of Exercise per Week: 2 days    Minutes of Exercise per Session: 30 min  Stress: No Stress Concern Present (10/27/2023)   [employer] of Occupational Health - Occupational Stress Questionnaire    Feeling of Stress : Only a little  Social Connections: Socially Isolated (10/27/2023)   Social Connection and Isolation Panel [NHANES]    Frequency of Communication with Friends and Family: More than three times a week    Frequency of Social Gatherings with Friends and Family: More than three times a week    Attends Religious Services: Never    Database administrator or Organizations: No    Attends Banker Meetings: Never    Marital Status: Widowed    Tobacco Counseling Counseling given: Yes    Clinical Intake:  Pre-visit preparation completed: Yes  Pain : No/denies pain     BMI - recorded: 22.13 Nutritional Status: BMI of 19-24  Normal Nutritional Risks: None Diabetes: Yes CBG done?: No  Lab Results  Component Value Date   HGBA1C 5.8 (H) 08/14/2023   HGBA1C 5.8 (H) 07/20/2023   HGBA1C 6.1 (H) 12/21/2022     How often do you need to have someone help you when you read instructions, pamphlets, or other written materials from your doctor or pharmacy?: 1 - Never  Interpreter Needed?: No  Information entered by :: alia  t/cma   Activities of Daily Living     10/27/2023    1:38 PM  In your present state of health, do you have any difficulty performing the following activities:  Hearing? 1  Vision? 0  Comment pt goes [employer], water. Groat-/last a yr ago/upcoming in Sept. 2025  Difficulty concentrating or making decisions? 1  Comment memorypt had a stroke  Walking or climbing stairs? 0  Dressing or bathing? 0  Doing errands, shopping? 1  Comment pt's daughter-[name]  Preparing Food and eating ? N  Using the Toilet? N  In the past six months, have you accidently leaked urine? Y  Do you have problems with loss of bowel control? N  Managing your Medications? Y  Comment pt's daughter-[name]  Managing your Finances? N  Housekeeping or managing your Housekeeping? Y  Comment pt's daughter    Patient Care Team: Roise Cleaver, MD as PCP - General (Family Medicine) [facility]  I have updated your Care Teams any recent Medical Services you may have received from other providers in the past year.     Assessment:    This is a routine wellness examination for [place].  Hearing/Vision screen Hearing Screening - Comments:: Some per pt Vision Screening - Comments:: pt goes [employer]/Dr. Groat-/last a yr ago/upcoming in Sept. 2025   Goals Addressed             This Visit's Progress    Remain active and independent   On track      Depression Screen     10/27/2023    1:51 PM 08/20/2023    2:31 PM 07/20/2023    2:08 PM 12/21/2022    2:13 PM 05/21/2022    1:20 PM 12/11/2021    2:01 PM 08/11/2021    1:00 PM  PHQ 2/9 Scores  PHQ - 2 Score 0 0 0 0 0 0 0  PHQ- 9 Score 0 0 0        Fall Risk     10/27/2023    1:44 PM 08/20/2023    2:31 PM 12/21/2022  2:13 PM 08/19/2022    3:30 PM 05/21/2022    1:20 PM  Fall Risk   Falls in the past year? 1 1 0 0 0  Number falls in past yr: 0 0  0   Injury with Fall? 1 1  0   Comment l-elbow      Risk for fall due to : Impaired mobility;Impaired  balance/gait History of fall(s)  No Fall Risks   Follow up Falls evaluation completed;Education provided;Falls prevention discussed Education provided  Falls prevention discussed;Education provided;Falls evaluation completed     MEDICARE RISK AT HOME:  Medicare Risk at Home Any stairs in or around the home?: Yes If so, are there any without handrails?: Yes Home free of loose throw rugs in walkways, pet beds, electrical cords, etc?: Yes Adequate lighting in your home to reduce risk of falls?: Yes Life alert?: No Use of a cane, walker or w/c?: Yes Grab bars in the bathroom?: Yes Shower chair or bench in shower?: Yes Elevated toilet seat or a handicapped toilet?: No  TIMED UP AND GO:  Was the test performed?  No  Cognitive Function: 6CIT completed    09/29/2023    4:15 PM  MMSE - Mini Mental State Exam  Orientation to time 3  Orientation to Place 4  Registration 3  Attention/ Calculation 3  Recall 2  Language- name 2 objects 2  Language- repeat 1  Language- follow 3 step command 3  Language- read & follow direction 1  Write a sentence 1  Copy design 1  Total score 24        10/27/2023    1:54 PM 08/19/2022    3:31 PM  6CIT Screen  What Year? 0 points 0 points  What month? 0 points 0 points  What time? 0 points 0 points  Count back from 20 0 points 0 points  Months in reverse 0 points 0 points  Repeat phrase 2 points 0 points  Total Score 2 points 0 points    Immunizations Immunization History  Administered Date(s) Administered   Fluad Quad(high Dose 65+) 04/29/2021   Influenza, High Dose Seasonal PF 03/13/2013, 02/04/2016, 02/17/2017, 02/04/2018   Influenza,inj,Quad PF,6+ Mos 03/18/2015, 02/17/2017   Influenza-Unspecified 03/03/1997, 01/30/2019, 02/11/2020   Moderna Sars-Covid-2 Vaccination 08/28/2019, 09/25/2019   PFIZER(Purple Top)SARS-COV-2 Vaccination 06/11/2020   Pneumococcal Conjugate-13 07/22/2017   Pneumococcal Polysaccharide-23 05/18/2002   Tdap  04/29/2015   Unspecified SARS-COV-2 Vaccination 02/28/2022   Zoster Recombinant(Shingrix) 02/16/2017, 05/10/2017    Screening Tests Health Maintenance  Topic Date Due   OPHTHALMOLOGY EXAM  10/22/2023   COVID-19 Vaccine (5 - 2024-25 season) 11/11/2024 (Originally 01/17/2023)   INFLUENZA VACCINE  12/17/2023   FOOT EXAM  12/21/2023   HEMOGLOBIN A1C  02/14/2024   DEXA SCAN  06/03/2024   Medicare Annual Wellness (AWV)  10/26/2024   DTaP/Tdap/Td (2 - Td or Tdap) 04/28/2025   Pneumonia Vaccine 22+ Years old  Completed   Zoster Vaccines- Shingrix  Completed   HPV VACCINES  Aged Out   Meningococcal B Vaccine  Aged Out    Health Maintenance  Health Maintenance Due  Topic Date Due   OPHTHALMOLOGY EXAM  10/22/2023   Health Maintenance Items Addressed: See Nurse Notes at the end of this note  Additional Screening:  Vision Screening: Recommended annual ophthalmology exams for early detection of glaucoma and other disorders of the eye. Would you like a referral to an eye doctor? No    Dental Screening: Recommended annual dental exams for proper  oral hygiene  Community Resource Referral / Chronic Care Management: CRR required this visit?  No   CCM required this visit?  No   Plan:    I have personally reviewed and noted the following in the patient's chart:   Medical and social history Use of alcohol, tobacco or illicit drugs  Current medications and supplements including opioid prescriptions. Patient is not currently taking opioid prescriptions. Functional ability and status Nutritional status Physical activity Advanced directives List of other physicians Hospitalizations, surgeries, and ER visits in previous 12 months Vitals Screenings to include cognitive, depression, and falls Referrals and appointments  In addition, I have reviewed and discussed with patient certain preventive protocols, quality metrics, and best practice recommendations. A written personalized care  plan for preventive services as well as general preventive health recommendations were provided to patient.   Michaelle Adolphus, CMA   10/27/2023   After Visit Summary: (MyChart) Due to this being a telephonic visit, the after visit summary with patients personalized plan was offered to patient via MyChart   Notes: Pt is aware and due for a Diabetic eye exam with an upcoming appt in 9/25.

## 2023-10-27 NOTE — Patient Instructions (Signed)
 Sophia [name] , Thank you for taking time out of your busy schedule to complete your Annual Wellness Visit with me. I enjoyed our conversation and look forward to speaking with you again next year. I, as well as your care team,  appreciate your ongoing commitment to your health goals. Please review the following plan we discussed and let me know if I can assist you in the future. Your Game plan/ To Do List    Follow up Visits: Next Medicare AWV with our clinical staff: 10/27/24 at 11:20a.m.   Have you seen your provider in the last 6 months (3 months if uncontrolled diabetes)? Yes Next Office Visit with your provider: 12/07/23 at 2:10p.m.  Clinician Recommendations:  Aim for 30 minutes of exercise or brisk walking, 6-8 glasses of water, and 5 servings of fruits and vegetables each day.       This is a list of the screening recommended for you and due dates:  Health Maintenance  Topic Date Due   Eye exam for diabetics  10/22/2023   COVID-19 Vaccine (5 - 2024-25 season) 11/11/2024*   Flu Shot  12/17/2023   Complete foot exam   12/21/2023   Hemoglobin A1C  02/14/2024   DEXA scan (bone density measurement)  06/03/2024   Medicare Annual Wellness Visit  10/26/2024   DTaP/Tdap/Td vaccine (2 - Td or Tdap) 04/28/2025   Pneumonia Vaccine  Completed   Zoster (Shingles) Vaccine  Completed   HPV Vaccine  Aged Out   Meningitis B Vaccine  Aged Out  *Topic was postponed. The date shown is not the original due date.    Advanced directives: (Declined) Advance directive discussed with you today. Even though you declined this today, please call our office should you change your mind, and we can give you the proper paperwork for you to fill out. Advance Care Planning is important because it:  [x]  Makes sure you receive the medical care that is consistent with your values, goals, and preferences  [x]  It provides guidance to your family and loved ones and reduces their decisional burden about whether or  not they are making the right decisions based on your wishes.  Follow the link provided in your after visit summary or read over the paperwork we have mailed to you to help you started getting your Advance Directives in place. If you need assistance in completing these, please reach out to us  so that we can help you!  See attachments for Preventive Care and Fall Prevention Tips.

## 2023-10-28 ENCOUNTER — Ambulatory Visit: Admitting: Neurology

## 2023-10-29 ENCOUNTER — Encounter: Payer: Self-pay | Admitting: Family Medicine

## 2023-10-29 DIAGNOSIS — I480 Paroxysmal atrial fibrillation: Secondary | ICD-10-CM | POA: Insufficient documentation

## 2023-11-04 ENCOUNTER — Telehealth: Payer: Self-pay

## 2023-11-04 NOTE — Telephone Encounter (Signed)
   Cardiac Monitor Alert  Date of alert:  11/04/2023   Patient Name: Sophia [name]  DOB: 1932-04-14  MRN: [id number]   [facility] Cardiologist: None  [facility] EP:  None    Monitor Information: Cardiac Event Monitor [[employer]]  Reason:  Atrial Fibrillation  Ordering provider:  Dr Janett Medin :1}  Alert Atrial Fibrillation/Flutter with 3.2 second pause This is the 2nd alert for this rhythm.  The patient has a hx of Atrial Fibrillation/Flutter.    Anticoagulation medication as of 11/04/2023           apixaban  (ELIQUIS ) 2.5 MG TABS tablet Take 1 tablet (2.5 mg total) by mouth 2 (two) times daily.       Next Cardiology Appointment   Date:  11/15/23  Provider:  Minnie Amber at [facility]  The patient was contacted today.  She is asymptomatic. Arrhythmia, symptoms and history reviewed with Dr Loetta Ringer in office (DOD).  Plan:  No changes at this time, patient already on eliquis   Spoke with patient, confirmed she has been taking her eliquis  as prescribed. No symptoms at all since applying monitor per patient. No needs at this time   Dareen Ebbing, RN  11/04/2023 5:13 PM

## 2023-11-08 ENCOUNTER — Ambulatory Visit: Attending: Neurology

## 2023-11-08 ENCOUNTER — Other Ambulatory Visit: Payer: Self-pay | Admitting: *Deleted

## 2023-11-08 DIAGNOSIS — I63411 Cerebral infarction due to embolism of right middle cerebral artery: Secondary | ICD-10-CM

## 2023-11-08 DIAGNOSIS — I639 Cerebral infarction, unspecified: Secondary | ICD-10-CM

## 2023-11-08 DIAGNOSIS — G3184 Mild cognitive impairment, so stated: Secondary | ICD-10-CM

## 2023-11-08 DIAGNOSIS — I679 Cerebrovascular disease, unspecified: Secondary | ICD-10-CM

## 2023-11-08 MED ORDER — RISEDRONATE SODIUM 150 MG PO TABS: 150.0000 mg | ORAL_TABLET | ORAL | 0 refills | Status: DC

## 2023-11-11 ENCOUNTER — Other Ambulatory Visit: Payer: Self-pay | Admitting: Family Medicine

## 2023-11-11 DIAGNOSIS — E782 Mixed hyperlipidemia: Secondary | ICD-10-CM

## 2023-11-15 ENCOUNTER — Ambulatory Visit (HOSPITAL_COMMUNITY)
Admission: RE | Admit: 2023-11-15 | Discharge: 2023-11-15 | Disposition: A | Discharge status: Home / Self Care | Source: Ambulatory Visit | Attending: Internal Medicine | Admitting: Internal Medicine

## 2023-11-15 VITALS — BP 136/60 | HR 60 | Ht 62.0 in | Wt 120.8 lb

## 2023-11-15 DIAGNOSIS — I4891 Unspecified atrial fibrillation: Secondary | ICD-10-CM | POA: Diagnosis not present

## 2023-11-15 DIAGNOSIS — D6869 Other thrombophilia: Secondary | ICD-10-CM

## 2023-11-15 DIAGNOSIS — I480 Paroxysmal atrial fibrillation: Secondary | ICD-10-CM

## 2023-11-15 NOTE — Progress Notes (Signed)
 Primary Care Physician: Sophia Lowers, MD Primary Cardiologist: None Electrophysiologist: None     Referring Physician: Dr. Shlomo [name] [name] is a 89 y.o. female with a history of T2DM, HTN, CVA, HLD, hypothyroidism, and atrial fibrillation who presents for consultation in the [facility]. Cardiac event monitor placed by Dr. Rosemarie on 5/14 showed new onset Afib on 5/25. Patient has a CHADS2VASC score of 7.  On follow up 11/15/23, she is currently in NSR. Cardiac monitor not yet finalized but preliminary data shows 14% Afib burden with longest episode just under 3 hours. Patient did not have cardiac awareness of any arrhythmia. She is taking Coreg  18.75 mg BID. No bleeding issues on Eliquis  2.5 mg BID.  Today, she denies symptoms of palpitations, chest pain, shortness of breath, orthopnea, PND, lower extremity edema, dizziness, presyncope, syncope, snoring, daytime somnolence, bleeding, or neurologic sequela. The patient is tolerating medications without difficulties and is otherwise without complaint today.   she has a BMI of Body mass index is 22.09 kg/m.Sophia Filed Weights   11/15/23 1441  Weight: 54.8 kg     Current Outpatient Medications  Medication Sig Dispense Refill   amLODipine  (NORVASC ) 10 MG tablet Take 1 tablet (10 mg total) by mouth daily. 90 tablet 1   apixaban  (ELIQUIS ) 2.5 MG TABS tablet Take 1 tablet (2.5 mg total) by mouth 2 (two) times daily. 60 tablet 3   Calcium  Carbonate Antacid (TUMS PO) Take 1 tablet by mouth as needed.     carvedilol  (COREG ) 12.5 MG tablet Take 1.5 tablets (18.75 mg total) by mouth 2 (two) times daily with a meal. 90 tablet 1   Cholecalciferol (DIALYVITE VITAMIN D  5000) 125 MCG (5000 UT) capsule Take 5,000 Units by mouth daily.     donepezil  (ARICEPT ) 5 MG tablet Take 2 tablets (10 mg total) by mouth at bedtime. 60 tablet 5   ferrous sulfate  325 (65 FE) MG tablet Take 1 tablet (325 mg total) by mouth daily  with breakfast.     levothyroxine  (SYNTHROID ) 100 MCG tablet TAKE 1 TABLET BY MOUTH ONCE DAILY. NEEDS LABWORK. 90 tablet 3   Magnesium  250 MG TABS Take 250 mg by mouth daily.     olmesartan  (BENICAR ) 40 MG tablet Take 1 tablet (40 mg total) by mouth daily. 90 tablet 0   omeprazole  (PRILOSEC) 40 MG capsule TAKE 1 CAPSULE ON AN EMPTY STOMACH 1 HOUR BEFORE BREAKFAST AND ANOTHER AT BEDTIME (2 HOURS AFTER YOUR LAST FOOD AND DRINK EXCEPT WATER) 180 capsule 0   risedronate  (ACTONEL ) 150 MG tablet Take 1 tablet (150 mg total) by mouth every 30 (thirty) days. with water, on an empty stomach, take nothing by mouth and to not lie down for 30 minutes after each dose. 3 tablet 0   simvastatin  (ZOCOR ) 40 MG tablet TAKE 1/2 (ONE-HALF) TABLET BY MOUTH ONCE DAILY AT  6  PM 45 tablet 0   vitamin B-12 (CYANOCOBALAMIN) 1000 MCG tablet Take 1,000 mcg by mouth daily.     No current facility-administered medications for this encounter.    Atrial Fibrillation Management history:  Previous antiarrhythmic drugs: none Previous cardioversions: none Previous ablations: none Anticoagulation history: Eliquis  2.5 mg BID   ROS- All systems are reviewed and negative except as per the HPI above.  Physical Exam: BP 136/60   Pulse 60   Ht 5' 2 (1.575 m)   Wt 54.8 kg   BMI 22.09 kg/m   GEN- The patient is  well appearing, alert and oriented x 3 today.   Neck - no JVD or carotid bruit noted Lungs- Clear to ausculation bilaterally, normal work of breathing Heart- Regular rate and rhythm, no murmurs, rubs or gallops, PMI not laterally displaced Extremities- no clubbing, cyanosis, or edema Skin - no rash or ecchymosis noted   EKG today demonstrates  Vent. rate 60 BPM PR interval 208 ms QRS duration 84 ms QT/QTcB 408/408 ms P-R-T axes 82 52 56 Normal sinus rhythm Normal ECG When compared with ECG of 12-Oct-2023 15:02, Sinus rhythm has replaced Atrial fibrillation  Echo 08/14/23 demonstrated   1. Left  ventricular ejection fraction, by estimation, is 65 to 70%. The  left ventricle has normal function. Left ventricular endocardial border  not optimally defined to evaluate regional wall motion. Left ventricular  diastolic parameters are consistent  with Grade II diastolic dysfunction (pseudonormalization).   2. Right ventricular systolic function is normal. The right ventricular  size is normal. Tricuspid regurgitation signal is inadequate for assessing  PA pressure.   3. Left atrial size was mildly dilated.   4. The mitral valve is degenerative. Trivial mitral valve regurgitation.  No evidence of mitral stenosis.   5. The aortic valve is grossly normal. There is mild calcification of the  aortic valve. Aortic valve regurgitation is not visualized. Aortic valve  sclerosis/calcification is present, without any evidence of aortic  stenosis.   6. IVC not well visualized.. The inferior vena cava is normal in size  with greater than 50% respiratory variability, suggesting right atrial  pressure of 3 mmHg.    ASSESSMENT & PLAN CHA2DS2-VASc Score = 7  The patient's score is based upon: CHF History: 0 HTN History: 1 Diabetes History: 1 Stroke History: 2 Vascular Disease History: 0 Age Score: 2 Gender Score: 1       ASSESSMENT AND PLAN: Paroxysmal Atrial Fibrillation (ICD10:  I48.0) The patient's CHA2DS2-VASc score is 7, indicating a 11.2% annual risk of stroke.    She is currently in NSR. Continue coreg  18.75 mg BID. After discussion of cardiac monitor results with patient and family, decision made to proceed without change. She does not have cardiac awareness, and we briefly discussed AAD therapy such as amiodarone in the future going forward, but at this time will defer AAD therapy.    Secondary Hypercoagulable State (ICD10:  D68.69) The patient is at significant risk for stroke/thromboembolism based upon her CHA2DS2-VASc Score of 7.  Start Apixaban  (Eliquis ).  Continue Eliquis   2.5 mg BID; dosage correct based on age and weight < 60 kg. Will draw CBC today.     Follow up 6 months Afib clinic.   Sophia Pac, PA-C  [facility] [address] [place], [place] [address] [phone]

## 2023-11-16 ENCOUNTER — Ambulatory Visit (HOSPITAL_COMMUNITY): Payer: Self-pay | Admitting: Internal Medicine

## 2023-11-16 LAB — CBC
Hematocrit: 35.8 % (ref 34.0–46.6)
Hemoglobin: 11.4 g/dL (ref 11.1–15.9)
MCH: 32.4 pg (ref 26.6–33.0)
MCHC: 31.8 g/dL (ref 31.5–35.7)
MCV: 102 fL — ABNORMAL HIGH (ref 79–97)
Platelets: 286 10*3/uL (ref 150–450)
RBC: 3.52 x10E6/uL — ABNORMAL LOW (ref 3.77–5.28)
RDW: 11.8 % (ref 11.7–15.4)
WBC: 7.3 10*3/uL (ref 3.4–10.8)

## 2023-11-26 ENCOUNTER — Other Ambulatory Visit: Payer: Self-pay | Admitting: Family Medicine

## 2023-11-26 DIAGNOSIS — I10 Essential (primary) hypertension: Secondary | ICD-10-CM

## 2023-11-27 ENCOUNTER — Other Ambulatory Visit: Payer: Self-pay | Admitting: Family Medicine

## 2023-11-27 DIAGNOSIS — E119 Type 2 diabetes mellitus without complications: Secondary | ICD-10-CM

## 2023-11-27 DIAGNOSIS — E038 Other specified hypothyroidism: Secondary | ICD-10-CM

## 2023-11-27 DIAGNOSIS — I10 Essential (primary) hypertension: Secondary | ICD-10-CM

## 2023-12-07 ENCOUNTER — Encounter: Payer: Self-pay | Admitting: Family Medicine

## 2023-12-07 ENCOUNTER — Ambulatory Visit (INDEPENDENT_AMBULATORY_CARE_PROVIDER_SITE_OTHER): Admitting: Family Medicine

## 2023-12-07 VITALS — BP 107/61 | HR 62 | Temp 98.3°F | Ht 62.0 in | Wt 121.0 lb

## 2023-12-07 DIAGNOSIS — E782 Mixed hyperlipidemia: Secondary | ICD-10-CM | POA: Diagnosis not present

## 2023-12-07 DIAGNOSIS — I480 Paroxysmal atrial fibrillation: Secondary | ICD-10-CM | POA: Diagnosis not present

## 2023-12-07 DIAGNOSIS — I10 Essential (primary) hypertension: Secondary | ICD-10-CM

## 2023-12-07 MED ORDER — ROSUVASTATIN CALCIUM 10 MG PO TABS: 10.0000 mg | ORAL_TABLET | Freq: Every day | ORAL | 1 refills | Status: DC

## 2023-12-07 NOTE — Progress Notes (Signed)
 Subjective:  Patient ID: Sophia [name], female    DOB: 1932/04/22  Age: 89 y.o. MRN: [id number]  CC: Medical Management of Chronic Issues   HPI Sophia [name] presents for  follow-up of hypertension. Patient has no history of headache chest pain or shortness of breath or recent cough. Patient also denies symptoms of TIA such as focal numbness or weakness. Patient denies side effects from medication. States taking it regularly. Patient in for follow-up of atrial fibrillation. Patient denies any recent bouts of chest pain or palpitations. Additionally, patient is taking anticoagulants. Patient denies any recent excessive bleeding episodes including epistaxis, bleeding from the gums, genitalia, rectal bleeding or hematuria. Additionally there has been no excessive bruising.  Patient in for follow-up of elevated cholesterol. Doing well without complaints on current medication. Denies side effects of statin including myalgia and arthralgia and nausea. Also in today for liver function testing. Currently no chest pain, shortness of breath or other cardiovascular related symptoms noted.  Due to her use of amlodipine  for her blood pressure we decided together that the potential interaction between the medication and her simvastatin  was significant enough to make a switch to a more up-to-date statin, rosuvastatin . History Sophia [name] has a past medical history of Cancer (HCC), Chronic kidney disease, Diabetes mellitus without complication (HCC), GERD (gastroesophageal reflux disease), Hyperlipidemia, Hypertension, and Hypothyroidism.   She has a past surgical history that includes Cholecystectomy; Sigmoidoscopy; Esophagogastroduodenoscopy (N/A, 04/29/2015); Eye surgery (Right, 01/19/2019); Colonoscopy; Excision basal cell carcinoma (N/A, 05/21/2020); and Adjacent tissue transfer/tissue rearrangement (N/A, 05/21/2020).   Her family history includes Colon cancer in her sister; Heart disease in her brother,  father, and sister.She reports that she has never smoked. She has never used smokeless tobacco. She reports that she does not drink alcohol and does not use drugs.  Current Outpatient Medications on File Prior to Visit  Medication Sig Dispense Refill   amLODipine  (NORVASC ) 10 MG tablet Take 1 tablet (10 mg total) by mouth daily. 90 tablet 1   apixaban  (ELIQUIS ) 2.5 MG TABS tablet Take 1 tablet (2.5 mg total) by mouth 2 (two) times daily. 60 tablet 3   Calcium  Carbonate Antacid (TUMS PO) Take 1 tablet by mouth as needed.     carvedilol  (COREG ) 12.5 MG tablet TAKE 1 & 1/2 (ONE & ONE-HALF) TABLETS BY MOUTH TWICE DAILY WITH A MEAL 90 tablet 0   Cholecalciferol (DIALYVITE VITAMIN D  5000) 125 MCG (5000 UT) capsule Take 5,000 Units by mouth daily.     donepezil  (ARICEPT ) 5 MG tablet Take 2 tablets (10 mg total) by mouth at bedtime. 60 tablet 5   ferrous sulfate  325 (65 FE) MG tablet Take 1 tablet (325 mg total) by mouth daily with breakfast.     levothyroxine  (SYNTHROID ) 100 MCG tablet Take 1 tablet by mouth once daily 90 tablet 0   Magnesium  250 MG TABS Take 250 mg by mouth daily.     olmesartan  (BENICAR ) 40 MG tablet Take 1 tablet by mouth once daily 90 tablet 0   omeprazole  (PRILOSEC) 40 MG capsule TAKE 1 CAPSULE ON AN EMPTY STOMACH 1 HOUR BEFORE BREAKFAST AND ANOTHER AT BEDTIME (2 HOURS AFTER YOUR LAST FOOD AND DRINK EXCEPT WATER) 180 capsule 0   risedronate  (ACTONEL ) 150 MG tablet Take 1 tablet (150 mg total) by mouth every 30 (thirty) days. with water, on an empty stomach, take nothing by mouth and to not lie down for 30 minutes after each dose. 3 tablet 0   vitamin B-12 (CYANOCOBALAMIN) 1000 MCG  tablet Take 1,000 mcg by mouth daily.     No current facility-administered medications on file prior to visit.    ROS Review of Systems  Constitutional: Negative.   HENT:  Negative for congestion.   Eyes:  Negative for visual disturbance.  Respiratory:  Negative for shortness of breath.    Cardiovascular:  Negative for chest pain.  Gastrointestinal:  Negative for abdominal pain, constipation, diarrhea, nausea and vomiting.  Genitourinary:  Negative for difficulty urinating.  Musculoskeletal:  Negative for arthralgias and myalgias.  Neurological:  Negative for headaches.  Psychiatric/Behavioral:  Negative for sleep disturbance.     Objective:  BP 107/61 Comment: arm in lap  Pulse 62   Temp 98.3 F (36.8 C)   Ht 5' 2 (1.575 m)   Wt 121 lb (54.9 kg)   SpO2 95%   BMI 22.13 kg/m   BP Readings from Last 3 Encounters:  12/07/23 107/61  11/15/23 136/60  10/27/23 (!) 117/55    Wt Readings from Last 3 Encounters:  12/07/23 121 lb (54.9 kg)  11/15/23 120 lb 12.8 oz (54.8 kg)  10/27/23 121 lb (54.9 kg)     Physical Exam Constitutional:      General: She is not in acute distress.    Appearance: She is well-developed.  HENT:     Head: Normocephalic and atraumatic.  Eyes:     Conjunctiva/sclera: Conjunctivae normal.     Pupils: Pupils are equal, round, and reactive to light.  Neck:     Thyroid : No thyromegaly.  Cardiovascular:     Rate and Rhythm: Normal rate and regular rhythm.     Heart sounds: Normal heart sounds. No murmur heard. Pulmonary:     Effort: Pulmonary effort is normal. No respiratory distress.     Breath sounds: Normal breath sounds. No wheezing or rales.  Abdominal:     General: Bowel sounds are normal. There is no distension.     Palpations: Abdomen is soft.     Tenderness: There is no abdominal tenderness.  Musculoskeletal:        General: Normal range of motion.     Cervical back: Normal range of motion and neck supple.  Lymphadenopathy:     Cervical: No cervical adenopathy.  Skin:    General: Skin is warm and dry.  Neurological:     Mental Status: She is alert and oriented to person, place, and time.  Psychiatric:        Behavior: Behavior normal.        Thought Content: Thought content normal.        Judgment: Judgment normal.        Assessment & Plan:  Mixed hyperlipidemia  Other orders -     Rosuvastatin  Calcium ; Take 1 tablet (10 mg total) by mouth daily. For cholesterol  Dispense: 90 tablet; Refill: 1    Allergies as of 12/07/2023   No Known Allergies      Medication List        Accurate as of December 07, 2023  3:01 PM. If you have any questions, ask your nurse or doctor.          STOP taking these medications    simvastatin  40 MG tablet Commonly known as: ZOCOR  Stopped by: Sophia [name]       TAKE these medications    amLODipine  10 MG tablet Commonly known as: NORVASC  Take 1 tablet (10 mg total) by mouth daily.   apixaban  2.5 MG Tabs tablet Commonly known as: Eliquis  Take  1 tablet (2.5 mg total) by mouth 2 (two) times daily.   carvedilol  12.5 MG tablet Commonly known as: COREG  TAKE 1 & 1/2 (ONE & ONE-HALF) TABLETS BY MOUTH TWICE DAILY WITH A MEAL   cyanocobalamin 1000 MCG tablet Commonly known as: VITAMIN B12 Take 1,000 mcg by mouth daily.   Dialyvite Vitamin D  5000 125 MCG (5000 UT) capsule Generic drug: Cholecalciferol Take 5,000 Units by mouth daily.   donepezil  5 MG tablet Commonly known as: ARICEPT  Take 2 tablets (10 mg total) by mouth at bedtime.   ferrous sulfate  325 (65 FE) MG tablet Take 1 tablet (325 mg total) by mouth daily with breakfast.   levothyroxine  100 MCG tablet Commonly known as: SYNTHROID  Take 1 tablet by mouth once daily   Magnesium  250 MG Tabs Take 250 mg by mouth daily.   olmesartan  40 MG tablet Commonly known as: BENICAR  Take 1 tablet by mouth once daily   omeprazole  40 MG capsule Commonly known as: PRILOSEC TAKE 1 CAPSULE ON AN EMPTY STOMACH 1 HOUR BEFORE BREAKFAST AND ANOTHER AT BEDTIME (2 HOURS AFTER YOUR LAST FOOD AND DRINK EXCEPT WATER)   risedronate  150 MG tablet Commonly known as: Actonel  Take 1 tablet (150 mg total) by mouth every 30 (thirty) days. with water, on an empty stomach, take nothing by mouth and to not lie down  for 30 minutes after each dose.   rosuvastatin  10 MG tablet Commonly known as: Crestor  Take 1 tablet (10 mg total) by mouth daily. For cholesterol Started by: Marie Chow   TUMS PO Take 1 tablet by mouth as needed.         Follow-up: Return in about 3 months (around 03/08/2024).  Butler Der, M.D.

## 2023-12-11 DIAGNOSIS — I63411 Cerebral infarction due to embolism of right middle cerebral artery: Secondary | ICD-10-CM

## 2023-12-11 DIAGNOSIS — I639 Cerebral infarction, unspecified: Secondary | ICD-10-CM | POA: Diagnosis not present

## 2023-12-13 ENCOUNTER — Ambulatory Visit: Payer: Self-pay | Admitting: Neurology

## 2023-12-13 NOTE — Progress Notes (Signed)
 Kindly inform the patient that heart monitor study does show evidence of paroxysmal atrial fibrillation around 14% of the time.  Continue her current blood thinner medication Eliquis   to prevent clots and strokes.

## 2023-12-22 NOTE — Telephone Encounter (Signed)
 Contacted pt, informed her heart monitor study does show evidence of paroxysmal atrial fibrillation around 14% of the time. Advised she continue her current blood thinner medication Eliquis  to prevent clots and strokes, per MD. patient verbally understood and was appreciative.  Advised to call office back with questions or concerns as she had none at this time.

## 2023-12-27 ENCOUNTER — Other Ambulatory Visit: Payer: Self-pay | Admitting: Family Medicine

## 2023-12-27 DIAGNOSIS — I10 Essential (primary) hypertension: Secondary | ICD-10-CM

## 2024-01-25 ENCOUNTER — Other Ambulatory Visit: Payer: Self-pay | Admitting: Family Medicine

## 2024-01-25 DIAGNOSIS — I10 Essential (primary) hypertension: Secondary | ICD-10-CM

## 2024-02-07 ENCOUNTER — Other Ambulatory Visit: Payer: Self-pay | Admitting: *Deleted

## 2024-02-07 MED ORDER — RISEDRONATE SODIUM 150 MG PO TABS: 150.0000 mg | ORAL_TABLET | ORAL | 0 refills | Status: DC

## 2024-02-09 ENCOUNTER — Other Ambulatory Visit (HOSPITAL_COMMUNITY): Payer: Self-pay | Admitting: Internal Medicine

## 2024-02-19 ENCOUNTER — Other Ambulatory Visit: Payer: Self-pay | Admitting: Family Medicine

## 2024-02-19 DIAGNOSIS — I10 Essential (primary) hypertension: Secondary | ICD-10-CM

## 2024-02-21 ENCOUNTER — Other Ambulatory Visit: Payer: Self-pay | Admitting: Family Medicine

## 2024-02-21 DIAGNOSIS — E038 Other specified hypothyroidism: Secondary | ICD-10-CM

## 2024-03-01 ENCOUNTER — Other Ambulatory Visit: Payer: Self-pay | Admitting: Family Medicine

## 2024-03-01 DIAGNOSIS — E119 Type 2 diabetes mellitus without complications: Secondary | ICD-10-CM

## 2024-03-01 DIAGNOSIS — K219 Gastro-esophageal reflux disease without esophagitis: Secondary | ICD-10-CM

## 2024-03-01 DIAGNOSIS — I10 Essential (primary) hypertension: Secondary | ICD-10-CM

## 2024-03-08 ENCOUNTER — Ambulatory Visit: Admitting: Family Medicine

## 2024-03-20 ENCOUNTER — Ambulatory Visit: Payer: Self-pay | Admitting: Family Medicine

## 2024-03-20 ENCOUNTER — Encounter: Payer: Self-pay | Admitting: Family Medicine

## 2024-03-20 VITALS — BP 125/61 | HR 56 | Temp 98.0°F | Ht 62.0 in | Wt 126.0 lb

## 2024-03-20 DIAGNOSIS — E119 Type 2 diabetes mellitus without complications: Secondary | ICD-10-CM

## 2024-03-20 DIAGNOSIS — E782 Mixed hyperlipidemia: Secondary | ICD-10-CM | POA: Diagnosis not present

## 2024-03-20 DIAGNOSIS — I480 Paroxysmal atrial fibrillation: Secondary | ICD-10-CM | POA: Diagnosis not present

## 2024-03-20 DIAGNOSIS — K219 Gastro-esophageal reflux disease without esophagitis: Secondary | ICD-10-CM

## 2024-03-20 DIAGNOSIS — E039 Hypothyroidism, unspecified: Secondary | ICD-10-CM

## 2024-03-20 DIAGNOSIS — I10 Essential (primary) hypertension: Secondary | ICD-10-CM

## 2024-03-20 DIAGNOSIS — E038 Other specified hypothyroidism: Secondary | ICD-10-CM

## 2024-03-20 LAB — LIPID PANEL

## 2024-03-20 LAB — BAYER DCA HB A1C WAIVED: HB A1C (BAYER DCA - WAIVED): 5.7 % — ABNORMAL HIGH (ref 4.8–5.6)

## 2024-03-20 MED ORDER — ROSUVASTATIN CALCIUM 10 MG PO TABS: 10.0000 mg | ORAL_TABLET | Freq: Every day | ORAL | 1 refills | Status: AC

## 2024-03-20 MED ORDER — LEVOTHYROXINE SODIUM 100 MCG PO TABS: 100.0000 ug | ORAL_TABLET | Freq: Every day | ORAL | 0 refills | Status: AC

## 2024-03-20 MED ORDER — DONEPEZIL HCL 5 MG PO TABS: 5.0000 mg | ORAL_TABLET | Freq: Every day | ORAL | 3 refills | Status: DC

## 2024-03-20 MED ORDER — OMEPRAZOLE 40 MG PO CPDR: DELAYED_RELEASE_CAPSULE | ORAL | 0 refills | Status: AC

## 2024-03-20 MED ORDER — AMLODIPINE BESYLATE 10 MG PO TABS: 10.0000 mg | ORAL_TABLET | Freq: Every day | ORAL | 1 refills | Status: AC

## 2024-03-20 MED ORDER — RISEDRONATE SODIUM 150 MG PO TABS: 150.0000 mg | ORAL_TABLET | ORAL | 0 refills | Status: AC

## 2024-03-20 MED ORDER — OLMESARTAN MEDOXOMIL 40 MG PO TABS: 40.0000 mg | ORAL_TABLET | Freq: Every day | ORAL | 0 refills | Status: AC

## 2024-03-20 MED ORDER — CARVEDILOL 12.5 MG PO TABS: 12.5000 mg | ORAL_TABLET | Freq: Two times a day (BID) | ORAL | 0 refills | Status: DC

## 2024-03-20 NOTE — Progress Notes (Signed)
 Subjective:  Patient ID: Sophia [name], female    DOB: 16-Dec-1931  Age: 89 y.o. MRN: [id number]  CC: Medical Management of Chronic Issues (No concerns at this time. )   HPI  Discussed the use of AI scribe software for clinical note transcription with the patient, who gave verbal consent to proceed.  History of Present Illness Sophia [name] is an 89 year old female who presents for a routine follow-up and evaluation of her blood sugar and feeling cold.  She has not been checking her blood sugar as frequently as recommended. No symptoms of hypoglycemia such as shakiness, dizziness, nausea, or sweating. Her recent A1c was 5.7.  She frequently feels cold, attributing it to possibly low blood levels. She takes an iron supplement and notes that she stays cold but warms up quickly with a blanket. She is uncertain if her thyroid  or anemia could be contributing to this sensation.  She is on medication to aid memory and cognition, currently taking one 10 mg pill daily. She has a history of stroke, which occurred on March 28th, affecting her finger dexterity, though she notes improvement over time.     follow-up on  thyroid . The patient has a history of hypothyroidism for many years. It has been stable recently. Pt. denies any change in  voice, loss of hair, Gets cold easily Ener.gy level has been adequate. Patient denies constipation and diarrhea. No myxedema. Medication is as noted below. Verified that pt is taking it daily on an empty stomach. Well tolerated.  Atrial fibrillation follow up. Pt. is treated with rate control and anticoagulation. Pt.  denies palpitations, rapid rate, chest pain, dyspnea and edema. There has been no bleeding from nose or gums. Pt. has not noticed blood with urine or stool.  Although there is routine bruising easily, it is not excessive.      03/20/2024    2:18 PM 12/07/2023    2:21 PM 10/27/2023    1:51 PM  Depression screen PHQ 2/9  Decreased Interest 0  0 0  Down, Depressed, Hopeless 0 0 0  PHQ - 2 Score 0 0 0  Altered sleeping 0  0  Tired, decreased energy 0  0  Change in appetite 0  0  Feeling bad or failure about yourself  0  0  Trouble concentrating 0  0  Moving slowly or fidgety/restless 0  0  Suicidal thoughts 0  0  PHQ-9 Score 0  0  Difficult doing work/chores Not difficult at all  Not difficult at all    History [name] has a past medical history of Cancer (HCC), Chronic kidney disease, Diabetes mellitus without complication (HCC), GERD (gastroesophageal reflux disease), Hyperlipidemia, Hypertension, and Hypothyroidism.   She has a past surgical history that includes Cholecystectomy; Sigmoidoscopy; Esophagogastroduodenoscopy (N/A, 04/29/2015); Eye surgery (Right, 01/19/2019); Colonoscopy; Excision basal cell carcinoma (N/A, 05/21/2020); and Adjacent tissue transfer/tissue rearrangement (N/A, 05/21/2020).   Her family history includes Colon cancer in her sister; Heart disease in her brother, father, and sister.She reports that she has never smoked. She has never used smokeless tobacco. She reports that she does not drink alcohol and does not use drugs.    ROS Review of Systems  Objective:  BP 125/61   Pulse (!) 56   Temp 98 F (36.7 C)   Ht 5' 2 (1.575 m)   Wt 126 lb (57.2 kg)   SpO2 97%   BMI 23.05 kg/m   BP Readings from Last 3 Encounters:  03/20/24 125/61  12/07/23 107/61  11/15/23 136/60    Wt Readings from Last 3 Encounters:  03/20/24 126 lb (57.2 kg)  12/07/23 121 lb (54.9 kg)  11/15/23 120 lb 12.8 oz (54.8 kg)     Physical Exam Physical Exam GENERAL: Alert, cooperative, well developed, no acute distress. HEENT: Normocephalic, normal oropharynx, moist mucous membranes. CHEST: Clear to auscultation bilaterally, no wheezes, rhonchi, or crackles. CARDIOVASCULAR: Normal heart rate and rhythm, S1 and S2 normal without murmurs. ABDOMEN: Soft, non-tender, non-distended, without organomegaly, normal bowel  sounds. EXTREMITIES: No cyanosis, edema, or swelling. NEUROLOGICAL: Cranial nerves grossly intact, moves all extremities without gross motor or sensory deficit.   Assessment & Plan:  Diabetes mellitus without complication (HCC) -     Bayer DCA Hb A1c Waived -     Olmesartan  Medoxomil; Take 1 tablet (40 mg total) by mouth daily.  Dispense: 90 tablet; Refill: 0  Essential (primary) hypertension -     CBC with Differential/Platelet -     Carvedilol ; Take 1 tablet (12.5 mg total) by mouth 2 (two) times daily with a meal.  Dispense: 90 tablet; Refill: 0 -     amLODIPine  Besylate; Take 1 tablet (10 mg total) by mouth daily.  Dispense: 90 tablet; Refill: 1 -     Olmesartan  Medoxomil; Take 1 tablet (40 mg total) by mouth daily.  Dispense: 90 tablet; Refill: 0  Mixed hyperlipidemia -     Comprehensive metabolic panel with GFR -     Lipid panel  Paroxysmal atrial fibrillation (HCC) -     CBC with Differential/Platelet  Hypothyroidism, unspecified type -     TSH + free T4  Gastroesophageal reflux disease without esophagitis -     Omeprazole ; TAKE 1 CAPSULE BY MOUTH ON AN EMPTY STOMACH 1 HOUR BEFORE BREAKFAST AND ANOTHER AT BEDTIME (2 HOURS AFTER LAST FOOD AND DRINK EXCEPT WATER)  Dispense: 180 capsule; Refill: 0  Other specified hypothyroidism -     Levothyroxine  Sodium; Take 1 tablet (100 mcg total) by mouth daily.  Dispense: 90 tablet; Refill: 0  Other orders -     Rosuvastatin  Calcium ; Take 1 tablet (10 mg total) by mouth daily. For cholesterol  Dispense: 90 tablet; Refill: 1 -     Risedronate  Sodium; Take 1 tablet (150 mg total) by mouth every 30 (thirty) days. with water, on an empty stomach, take nothing by mouth and to not lie down for 30 minutes after each dose.  Dispense: 3 tablet; Refill: 0 -     Donepezil  HCl; Take 1 tablet (5 mg total) by mouth at bedtime.  Dispense: 90 tablet; Refill: 3    Assessment and Plan Assessment & Plan Type 2 Diabetes Mellitus   A1c is 5.7%,  indicating good glycemic control. She reports no symptoms of hypoglycemia but is not regularly checking blood glucose at home. Continue current diabetes management plan and encourage regular blood glucose monitoring at home.  Hypothyroidism   She reports feeling cold, which could be related to hypothyroidism. Thyroid  function tests were drawn to assess current thyroid  status. A blood thinner may also contribute to feeling cold. Review thyroid  function test results when available and adjust levothyroxine  dosage if necessary based on test results.  Sequelae of Cerebrovascular Accident (Stroke)   She reports some difficulty with fine motor skills, likely residual effects from the stroke. Improvement is expected as it has only been seven months since the stroke.  General Health Maintenance   Discussed the importance of exercise to increase metabolism and generate body heat. Encourage daily  exercise to improve metabolism and overall health.       Follow-up: No follow-ups on file.  Butler Der, M.D.

## 2024-03-21 ENCOUNTER — Ambulatory Visit: Payer: Self-pay | Admitting: Family Medicine

## 2024-03-21 LAB — CBC WITH DIFFERENTIAL/PLATELET
Basophils Absolute: 0.1 x10E3/uL (ref 0.0–0.2)
Basos: 1 %
EOS (ABSOLUTE): 0.3 x10E3/uL (ref 0.0–0.4)
Eos: 4 %
Hematocrit: 35.2 % (ref 34.0–46.6)
Hemoglobin: 11.5 g/dL (ref 11.1–15.9)
Immature Grans (Abs): 0 x10E3/uL (ref 0.0–0.1)
Immature Granulocytes: 0 %
Lymphocytes Absolute: 1.8 x10E3/uL (ref 0.7–3.1)
Lymphs: 26 %
MCH: 33.2 pg — ABNORMAL HIGH (ref 26.6–33.0)
MCHC: 32.7 g/dL (ref 31.5–35.7)
MCV: 102 fL — ABNORMAL HIGH (ref 79–97)
Monocytes Absolute: 0.5 x10E3/uL (ref 0.1–0.9)
Monocytes: 8 %
Neutrophils Absolute: 4.2 x10E3/uL (ref 1.4–7.0)
Neutrophils: 61 %
Platelets: 272 x10E3/uL (ref 150–450)
RBC: 3.46 x10E6/uL — ABNORMAL LOW (ref 3.77–5.28)
RDW: 12.1 % (ref 11.7–15.4)
WBC: 6.9 x10E3/uL (ref 3.4–10.8)

## 2024-03-21 LAB — COMPREHENSIVE METABOLIC PANEL WITH GFR
ALT: 11 IU/L (ref 0–32)
AST: 20 IU/L (ref 0–40)
Albumin: 4.1 g/dL (ref 3.7–4.7)
Alkaline Phosphatase: 62 IU/L (ref 48–129)
BUN/Creatinine Ratio: 18 (ref 12–28)
BUN: 19 mg/dL (ref 8–27)
Bilirubin Total: 0.4 mg/dL (ref 0.0–1.2)
CO2: 23 mmol/L (ref 20–29)
Calcium: 9 mg/dL (ref 8.7–10.3)
Chloride: 109 mmol/L — AB (ref 96–106)
Creatinine, Ser: 1.07 mg/dL — AB (ref 0.57–1.00)
Globulin, Total: 2.2 g/dL (ref 1.5–4.5)
Glucose: 131 mg/dL — AB (ref 70–99)
Potassium: 3.8 mmol/L (ref 3.5–5.2)
Sodium: 144 mmol/L (ref 134–144)
Total Protein: 6.3 g/dL (ref 6.0–8.5)
eGFR: 50 mL/min/1.73 — AB (ref >59)

## 2024-03-21 LAB — LIPID PANEL
Cholesterol, Total: 175 mg/dL (ref 100–199)
HDL: 56 mg/dL (ref >39)
LDL CALC COMMENT:: 3.1 ratio (ref 0.0–4.4)
LDL Chol Calc (NIH): 64 mg/dL (ref 0–99)
Triglycerides: 359 mg/dL — AB (ref 0–149)
VLDL Cholesterol Cal: 55 mg/dL — AB (ref 5–40)

## 2024-03-21 LAB — TSH+FREE T4
Free T4: 1.55 ng/dL (ref 0.82–1.77)
TSH: 0.278 u[IU]/mL — AB (ref 0.450–4.500)

## 2024-03-21 NOTE — Progress Notes (Signed)
Hello Sophiea,  Your lab result is normal and/or stable.Some minor variations that are not significant are commonly marked abnormal, but do not represent any medical problem for you.  Best regards, Lariza Cothron, M.D.

## 2024-03-29 ENCOUNTER — Encounter: Payer: Self-pay | Admitting: Neurology

## 2024-03-29 ENCOUNTER — Ambulatory Visit (INDEPENDENT_AMBULATORY_CARE_PROVIDER_SITE_OTHER): Admitting: Neurology

## 2024-03-29 VITALS — BP 120/66 | HR 68 | Ht 62.0 in | Wt 125.8 lb

## 2024-03-29 DIAGNOSIS — F0150 Vascular dementia without behavioral disturbance: Secondary | ICD-10-CM

## 2024-03-29 DIAGNOSIS — I480 Paroxysmal atrial fibrillation: Secondary | ICD-10-CM | POA: Diagnosis not present

## 2024-03-29 DIAGNOSIS — G309 Alzheimer's disease, unspecified: Secondary | ICD-10-CM

## 2024-03-29 DIAGNOSIS — R413 Other amnesia: Secondary | ICD-10-CM | POA: Diagnosis not present

## 2024-03-29 DIAGNOSIS — Z8673 Personal history of transient ischemic attack (TIA), and cerebral infarction without residual deficits: Secondary | ICD-10-CM

## 2024-03-29 DIAGNOSIS — F0280 Dementia in other diseases classified elsewhere without behavioral disturbance: Secondary | ICD-10-CM

## 2024-03-29 MED ORDER — DONEPEZIL HCL 10 MG PO TABS: 10.0000 mg | ORAL_TABLET | Freq: Every day | ORAL | 5 refills | Status: AC

## 2024-03-29 NOTE — Patient Instructions (Signed)
 had a long d/w patient and her daughter about her recent embolic stroke, intracranial stenosis, new diagnosis of paroxysmal A-fib on loop recorder, vascular dementia and risk for recurrent stroke/TIAs, personally independently reviewed imaging studies and stroke evaluation results and answered questions.Continue Eliquis  2.5 mg twice daily for secondary stroke prevention and maintain strict control of hypertension with blood pressure goal below 130/90, diabetes with hemoglobin A1c goal below 6.5% and lipids with LDL cholesterol goal below 70 mg/dL. I also advised the patient to eat a healthy diet with plenty of whole grains, cereals, fruits and vegetables, exercise regularly and maintain ideal body weight.  I recommend she continue ongoing physical occupational therapy and use her cane at all times.  We also discussed fall prevention precautions.  I also encouraged her to  continue Aricept  10 mg daily and to increase participation in cognitively challenging activities like solving crossword puzzles, playing bridge and sudoku.  We also discussed memory compensation strategies.  Followup in the future with me in 6 months with my nurse practitioner or call earlier if necessary.  Memory Compensation Strategies  Use WARM strategy.  W= write it down  A= associate it  R= repeat it  M= make a mental note  2.   You can keep a Glass Blower/designer.  Use a 3-ring notebook with sections for the following: calendar, important names and phone numbers,  medications, doctors' names/phone numbers, lists/reminders, and a section to journal what you did  each day.   3.    Use a calendar to write appointments down.  4.    Write yourself a schedule for the day.  This can be placed on the calendar or in a separate section of the Memory Notebook.  Keeping a  regular schedule can help memory.  5.    Use medication organizer with sections for each day or morning/evening pills.  You may need help loading it  6.    Keep a  basket, or pegboard by the door.  Place items that you need to take out with you in the basket or on the pegboard.  You may also want to  include a message board for reminders.  7.    Use sticky notes.  Place sticky notes with reminders in a place where the task is performed.  For example:  turn off the  stove placed by the stove, lock the door placed on the door at eye level,  take your medications on  the bathroom mirror or by the place where you normally take your medications.  8.    Use alarms/timers.  Use while cooking to remind yourself to check on food or as a reminder to take your medicine, or as a  reminder to make a call, or as a reminder to perform another task, etc.

## 2024-03-29 NOTE — Progress Notes (Signed)
 [facility] [address] [place]. [place] [address] [phone]       OFFICE FOLLOW-UP VISIT NOTE  [name] Date of Birth:  Apr 09, 1932 Medical Record Number:  [id number]   Referring MD:  Bernardino Come  Reason for Referral:  Stroke  HPI: Initial visit 09/29/2023 Sophia [name] is a pleasant 89 year old Caucasian lady seen today for initial office consultation visit for stroke.  History is obtained from them and review of electronic medical records.  I personally reviewed pertinent available imaging films in PACS.  She has past medical history of diabetes, hypertension, hyperlipidemia, hypothyroidism and remote bleeding peptic ulcer.  She presented on 08/13/2023 for evaluation for sudden onset of left-sided weakness.  She was fine the night before when she went to bed but she woke up at 2:30 in the morning to use the bathroom.  She has noticed she had difficulty using her left hand and dropping things as well as she feels she may have passed out.  The daughter found her lying on the floor and she had hurt herself a bit.  Patient was conscious and needed help to get up and was able to walk with assistance.  Next morning day noticed that she was dragging her left leg and left hand was weak.  CT head showed no acute abnormality but MRI scan confirmed patchy right MCA infarct and CT angiogram showed severe distal M2 branch stenosis on the right.  Echocardiogram showed ejection fraction of 65 to 70% with normal left atrial size.  LDL cholesterol 86 mg percent and hemoglobin A1c was 5.8.  Telemetry monitoring did not reveal any significant arrhythmias.  Patient was placed on dual antiplatelet therapy aspirin  and Plavix  and states that her gait and balance have improved.  She still has some diminished fine motor skills in the left hand but she is doing therapy which seems to be helping.  She does bruise easily on aspirin  and Plavix  but has had no bleeding so far.  She is  tolerating Zocor  well without muscle aches and pains.  She has not had any recurrent stroke or TIA symptoms.  The patient's daughter admits that she has noticed worsening of her short-term memory since her stroke.  She did have some mild cognitive impairment and memory difficulties even prior to the stroke which have now gotten worse.  Patient lives with her daughter.  She is mostly independent in actives of daily living.  She has been started on Aricept  5 mg daily for the last 3 months and is tolerating it well without any side effects.  She denies any history of cardiac arrhythmias, palpitations, fainting episodes. Update 03/29/2024 : She returns for follow-up after last visit 6 months ago.  She is accompanied by her daughter.  Patient states she is doing well.  She has had no recurrent stroke or TIA symptoms.  She was found to have paroxysmal A-fib lasting 14 minutes on loop recorder on 10/09/2023 and switched from aspirin  and Plavix  to Eliquis  2.5 twice daily which she is tolerating well without bruising or bleeding.  She does use a cane to ambulate long distances.  She has had no falls or injuries.  Her blood pressure is under good control and today it is 120/66.  She is tolerating Crestor  well without any side effects.  She did increase the Aricept  to 10 mg daily which she is tolerating well without GI or CNS side effects however they have not noticed any particular cognitive benefit.  She remains independent  in all activities of daily living.  She is living with her daughter.  She has not had any unsafe behavior, agitation, violent behavior, delusions or hallucinations.  On Mini-Mental testing today she scored 24/30 which is unchanged from last visit.  She has no new complaints. 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    Chronic kidney disease    Diabetes mellitus without complication (HCC)    GERD (gastroesophageal reflux disease)    Hyperlipidemia    Hypertension    Hypothyroidism     Social History:  Social History   Socioeconomic History   Marital status: Widowed    Spouse name: Not on file   Number of children: Not on file   Years of education: Not on file   Highest education level: Not on file  Occupational History   Not on file  Tobacco Use   Smoking status: Never   Smokeless tobacco: Never  Vaping Use   Vaping status: Never Used  Substance and Sexual Activity   Alcohol use: No   Drug use: No   Sexual activity: Not Currently  Other Topics Concern   Not on file  Social History Narrative   1 cup of caffeine daily    Social Drivers of Health   Financial Resource Strain: Low Risk  (10/27/2023)   Overall Financial Resource Strain (CARDIA)    Difficulty of Paying Living Expenses: Not hard at all  Food Insecurity: No Food Insecurity (10/27/2023)   Hunger Vital Sign    Worried About Running Out of Food in the Last Year: Never true    Ran Out of Food in the Last Year: Never true  Transportation Needs: No Transportation Needs (10/27/2023)   PRAPARE - Administrator, Civil Service (Medical): No    Lack of Transportation (Non-Medical): No  Physical Activity: Insufficiently Active (10/27/2023)   Exercise Vital Sign    Days of Exercise per Week: 2 days    Minutes of Exercise per Session: 30 min  Stress: No Stress Concern Present (10/27/2023)   [employer] of Occupational Health - Occupational Stress Questionnaire    Feeling of Stress : Only a little  Social Connections: Socially Isolated (10/27/2023)   Social Connection and Isolation Panel    Frequency of Communication with Friends and Family: More than three times a week    Frequency of Social Gatherings with Friends and Family: More than three times a week    Attends Religious Services: Never    Doctor, General Practice or Organizations: No    Attends Banker Meetings: Never    Marital Status: Widowed  Intimate Partner Violence: Not At Risk (10/27/2023)   Humiliation, Afraid, Rape, and Kick questionnaire    Fear of Current or Ex-Partner: No    Emotionally Abused: No    Physically Abused: No    Sexually Abused: No    Medications:   Current Outpatient Medications on File Prior to Visit  Medication Sig Dispense Refill   amLODipine  (NORVASC ) 10 MG tablet Take 1 tablet (10 mg total) by mouth daily. 90 tablet 1   apixaban  (ELIQUIS ) 2.5 MG TABS tablet Take 1 tablet by mouth twice daily 60 tablet 6   Calcium  Carbonate Antacid (TUMS PO) Take 1 tablet by mouth as needed.  carvedilol  (COREG ) 12.5 MG tablet Take 1 tablet (12.5 mg total) by mouth 2 (two) times daily with a meal. 90 tablet 0   Cholecalciferol (DIALYVITE VITAMIN D  5000) 125 MCG (5000 UT) capsule Take 5,000 Units by mouth daily.     donepezil  (ARICEPT ) 5 MG tablet Take 1 tablet (5 mg total) by mouth at bedtime. (Patient taking differently: Take 10 mg by mouth at bedtime.) 90 tablet 3   ferrous sulfate  325 (65 FE) MG tablet Take 1 tablet (325 mg total) by mouth daily with breakfast.     levothyroxine  (SYNTHROID ) 100 MCG tablet Take 1 tablet (100 mcg total) by mouth daily. 90 tablet 0   Magnesium  250 MG TABS Take 250 mg by mouth daily.     olmesartan  (BENICAR ) 40 MG tablet Take 1 tablet (40 mg total) by mouth daily. 90 tablet 0   omeprazole  (PRILOSEC) 40 MG capsule TAKE 1 CAPSULE BY MOUTH ON AN EMPTY STOMACH 1 HOUR BEFORE BREAKFAST AND ANOTHER AT BEDTIME (2 HOURS AFTER LAST FOOD AND DRINK EXCEPT WATER) 180 capsule 0   risedronate  (ACTONEL ) 150 MG tablet Take 1 tablet (150 mg total) by mouth every 30 (thirty) days. with water, on an empty stomach, take nothing by mouth and to not lie down for 30 minutes after each dose. 3 tablet 0   rosuvastatin  (CRESTOR ) 10 MG tablet Take 1 tablet (10 mg total) by mouth daily. For cholesterol 90 tablet  1   vitamin B-12 (CYANOCOBALAMIN) 1000 MCG tablet Take 1,000 mcg by mouth daily.     No current facility-administered medications on file prior to visit.    Allergies:  No Known Allergies  Physical Exam General: well developed, well nourished pleasant elderly Caucasian lady, seated, in no evident distress Head: head normocephalic and atraumatic.   Neck: supple with no carotid or supraclavicular bruits Cardiovascular: regular rate and rhythm, no murmurs Musculoskeletal: no deformity Skin:  no rash/petichiae Vascular:  Normal pulses all extremities  Neurologic Exam Mental Status: Awake and fully alert. Oriented to place and time. Recent and remote memory intact. Attention span, concentration and fund of knowledge appropriate. Mood and affect appropriate.  MMSE score 24/30.  Diminished recall 1/3.  Able to name only 10 animals which can walk on 4 legs.  Clock drawing 4/4.  Functional activity questionnaire she scored 9 suggesting mild dependence Cranial Nerves: Fundoscopic exam reveals sharp disc margins. Pupils equal, briskly reactive to light. Extraocular movements full without nystagmus. Visual fields full to confrontation. Hearing intact. Facial sensation intact. Face, tongue, palate moves normally and symmetrically.  Motor: Normal bulk and tone. Normal strength in all tested extremity muscles.  Diminished fine finger movements on the left.  Orbits right over left upper extremity.  Trace left grip weakness.  Mild left ankle dorsiflexor weakness. Sensory.: intact to touch , pinprick , position and vibratory sensation.  Coordination: Rapid alternating movements normal in all extremities. Finger-to-nose and heel-to-shin performed accurately bilaterally. Gait and Station: Arises from chair with mild difficulty. Stance is normal.  Uses a cane.  Gait cautious and slightly broad-based and favoring the left leg.  Unable to do tandem walking. Reflexes: 1+ and symmetric. Toes downgoing.   NIHSS   0 Modified Rankin  1    03/29/2024    3:46 PM 09/29/2023    4:15 PM  MMSE - Mini Mental State Exam  Orientation to time 5 3  Orientation to Place 3 4  Registration 2 3  Attention/ Calculation 3 3  Recall 2 2  Language-  name 2 objects 2 2  Language- repeat 1 1  Language- follow 3 step command 3 3  Language- read & follow direction 1 1  Write a sentence 1 1  Copy design 1 1  Total score 24 24     ASSESSMENT: 89 year old Caucasian lady with embolic right MCA branch infarct in March 2025 from intracranial stenosis and embolism from paroxysmal A-fib.  She also has mild mixed vascular and Alzheimer's dementia.     PLAN:I had a long d/w patient and her daughter about her recent embolic stroke, intracranial stenosis, vascular dementia and risk for recurrent stroke/TIAs, personally independently reviewed imaging studies and stroke evaluation results and answered questions.Continue aspirin  and Plavix  for 6 more weeks and then stop Plavix  and stay on aspirin  alone for secondary stroke prevention and maintain strict control of hypertension with blood pressure goal below 130/90, diabetes with hemoglobin A1c goal below 6.5% and lipids with LDL cholesterol goal below 70 mg/dL. I also advised the patient to eat a healthy diet with plenty of whole grains, cereals, fruits and vegetables, exercise regularly and maintain ideal body weight.  I recommend she continue ongoing physical occupational therapy and use her cane at all times.  We also discussed fall prevention precautions.  I also encouraged her to increase the dose of Aricept  to 10 mg daily and to increase participation in cognitively challenging activities like solving crossword puzzles, playing bridge and sudoku.  We also discussed memory compensation strategies.  Followup in the future with me in 6 months or call earlier if necessary.   I personally spent a total of 40 minutes in the care of the patient today including getting/reviewing separately  obtained history, performing a medically appropriate exam/evaluation, counseling and educating, placing orders, referring and communicating with other health care professionals, documenting clinical information in the EHR, independently interpreting results, and coordinating care.        Eather Popp, MD Note: This document was prepared with digital dictation and possible smart phrase technology. Any transcriptional errors that result from this process are unintentional.

## 2024-04-17 ENCOUNTER — Telehealth: Payer: Self-pay | Admitting: *Deleted

## 2024-04-17 NOTE — Telephone Encounter (Signed)
 Fax from [facility] RE: Carvedilol  12.5 mg 1 po BID w/ meal Note from pharmacy: pt has been on 1.5 tabs po BID. Was not aware of change. Is this correct? Please advise

## 2024-04-17 NOTE — Telephone Encounter (Signed)
 Tried to call and clarify with pharmacy and answering service would not patch me into a pharmacist. I will try again tomorrow. LS

## 2024-04-17 NOTE — Telephone Encounter (Signed)
 I reduced the dose due to brady cardia

## 2024-04-21 NOTE — Telephone Encounter (Unsigned)
 Copied from CRM [id number]. Topic: Clinical - Medication Question >> Apr 21, 2024  4:38 PM Delon T wrote: Reason for CRM: daughter Arland calling back, she listened to the message left and it was [place] calling about medications- please call back [phone]

## 2024-04-27 NOTE — Telephone Encounter (Signed)
 Contacted Sophia [name] per signed Dpr. She states she has made the adjustment opn patients carvedilol . She also brought up the medication Aricept . She says that the neurologist upped the dose to 10 mg. She wanted to make sure that is correct. She had gone to the pharmacy and there was a script for 5mg  from Dr. Zollie and 10 mg from Dr. Rosemarie.

## 2024-05-16 ENCOUNTER — Other Ambulatory Visit: Payer: Self-pay | Admitting: Family Medicine

## 2024-05-16 DIAGNOSIS — I10 Essential (primary) hypertension: Secondary | ICD-10-CM

## 2024-05-30 ENCOUNTER — Ambulatory Visit (HOSPITAL_COMMUNITY): Admitting: Internal Medicine

## 2024-06-06 ENCOUNTER — Encounter (HOSPITAL_COMMUNITY): Payer: Self-pay | Admitting: Internal Medicine

## 2024-06-06 ENCOUNTER — Ambulatory Visit (HOSPITAL_COMMUNITY)
Admission: RE | Admit: 2024-06-06 | Discharge: 2024-06-06 | Disposition: A | Discharge status: Home / Self Care | Source: Ambulatory Visit | Attending: Internal Medicine | Admitting: Internal Medicine

## 2024-06-06 VITALS — BP 116/60 | HR 57 | Ht 62.0 in | Wt 126.4 lb

## 2024-06-06 DIAGNOSIS — I480 Paroxysmal atrial fibrillation: Secondary | ICD-10-CM | POA: Diagnosis not present

## 2024-06-06 DIAGNOSIS — D6869 Other thrombophilia: Secondary | ICD-10-CM | POA: Diagnosis not present

## 2024-06-06 NOTE — Progress Notes (Signed)
 "   Primary Care Physician: Zollie Lowers, MD Primary Cardiologist: None Electrophysiologist: None     Referring Physician: Dr. Shlomo Arrant Day is a 89 y.o. female with a history of T2DM, HTN, CVA, HLD, hypothyroidism, and atrial fibrillation who presents for consultation in the [facility]. Cardiac event monitor placed by Dr. Rosemarie on 5/14 showed new onset Afib on 5/25. Patient has a CHADS2VASC score of 7.  On follow up 11/15/23, she is currently in NSR. Cardiac monitor not yet finalized but preliminary data shows 14% Afib burden with longest episode just under 3 hours. Patient did not have cardiac awareness of any arrhythmia. She is taking Coreg  18.75 mg BID. No bleeding issues on Eliquis  2.5 mg BID.  Follow-up 06/06/2024.  Patient is currently in NSR.  She is taking Coreg  12.5 mg twice daily.  No bleeding issues on Eliquis . She is doing well overall.   Today, she denies symptoms of palpitations, chest pain, shortness of breath, orthopnea, PND, lower extremity edema, dizziness, presyncope, syncope, snoring, daytime somnolence, bleeding, or neurologic sequela. The patient is tolerating medications without difficulties and is otherwise without complaint today.   she has a BMI of Body mass index is 23.12 kg/m.SABRA Filed Weights   06/06/24 1433  Weight: 57.3 kg      Current Outpatient Medications  Medication Sig Dispense Refill   amLODipine  (NORVASC ) 10 MG tablet Take 1 tablet (10 mg total) by mouth daily. 90 tablet 1   apixaban  (ELIQUIS ) 2.5 MG TABS tablet Take 1 tablet by mouth twice daily 60 tablet 6   Calcium  Carbonate Antacid (TUMS PO) Take 1 tablet by mouth as needed.     carvedilol  (COREG ) 12.5 MG tablet TAKE 1 TABLET BY MOUTH TWICE DAILY WITH A MEAL 90 tablet 0   Cholecalciferol (DIALYVITE VITAMIN D  5000) 125 MCG (5000 UT) capsule Take 5,000 Units by mouth daily.     donepezil  (ARICEPT ) 10 MG tablet Take 1 tablet (10 mg total) by mouth at  bedtime. 30 tablet 5   ferrous sulfate  325 (65 FE) MG tablet Take 1 tablet (325 mg total) by mouth daily with breakfast.     levothyroxine  (SYNTHROID ) 100 MCG tablet Take 1 tablet (100 mcg total) by mouth daily. 90 tablet 0   Magnesium  250 MG TABS Take 250 mg by mouth daily.     olmesartan  (BENICAR ) 40 MG tablet Take 1 tablet (40 mg total) by mouth daily. 90 tablet 0   omeprazole  (PRILOSEC) 40 MG capsule TAKE 1 CAPSULE BY MOUTH ON AN EMPTY STOMACH 1 HOUR BEFORE BREAKFAST AND ANOTHER AT BEDTIME (2 HOURS AFTER LAST FOOD AND DRINK EXCEPT WATER) 180 capsule 0   risedronate  (ACTONEL ) 150 MG tablet Take 1 tablet (150 mg total) by mouth every 30 (thirty) days. with water, on an empty stomach, take nothing by mouth and to not lie down for 30 minutes after each dose. 3 tablet 0   rosuvastatin  (CRESTOR ) 10 MG tablet Take 1 tablet (10 mg total) by mouth daily. For cholesterol 90 tablet 1   vitamin B-12 (CYANOCOBALAMIN) 1000 MCG tablet Take 1,000 mcg by mouth daily.     No current facility-administered medications for this encounter.    Atrial Fibrillation Management history:  Previous antiarrhythmic drugs: none Previous cardioversions: none Previous ablations: none Anticoagulation history: Eliquis  2.5 mg BID   ROS- All systems are reviewed and negative except as per the HPI above.  Physical Exam: BP 116/60   Pulse (!) 57  Ht 5' 2 (1.575 m)   Wt 57.3 kg   BMI 23.12 kg/m   GEN- The patient is well appearing, alert and oriented x 3 today.   Neck - no JVD or carotid bruit noted Lungs- Clear to ausculation bilaterally, normal work of breathing Heart- Regular rate and rhythm, no murmurs, rubs or gallops, PMI not laterally displaced Extremities- no clubbing, cyanosis, or edema Skin - no rash or ecchymosis noted    EKG today demonstrates  NSR - HR 57 bpm   Echo 08/14/23 demonstrated   1. Left ventricular ejection fraction, by estimation, is 65 to 70%. The  left ventricle has normal  function. Left ventricular endocardial border  not optimally defined to evaluate regional wall motion. Left ventricular  diastolic parameters are consistent  with Grade II diastolic dysfunction (pseudonormalization).   2. Right ventricular systolic function is normal. The right ventricular  size is normal. Tricuspid regurgitation signal is inadequate for assessing  PA pressure.   3. Left atrial size was mildly dilated.   4. The mitral valve is degenerative. Trivial mitral valve regurgitation.  No evidence of mitral stenosis.   5. The aortic valve is grossly normal. There is mild calcification of the  aortic valve. Aortic valve regurgitation is not visualized. Aortic valve  sclerosis/calcification is present, without any evidence of aortic  stenosis.   6. IVC not well visualized.. The inferior vena cava is normal in size  with greater than 50% respiratory variability, suggesting right atrial  pressure of 3 mmHg.    ASSESSMENT & PLAN CHA2DS2-VASc Score = 7  The patient's score is based upon: CHF History: 0 HTN History: 1 Diabetes History: 1 Stroke History: 2 Vascular Disease History: 0 Age Score: 2 Gender Score: 1       ASSESSMENT AND PLAN: Paroxysmal Atrial Fibrillation (ICD10:  I48.0) The patient's CHA2DS2-VASc score is 7, indicating a 11.2% annual risk of stroke.    Patient is currently in NSR.  She is happy with current management so we will not make any changes at this time.  Continue Coreg  12.5 mg twice daily.  Secondary Hypercoagulable State (ICD10:  D68.69) The patient is at significant risk for stroke/thromboembolism based upon her CHA2DS2-VASc Score of 7.  Continue Apixaban  (Eliquis ).  Continue Eliquis  2.5 mg twice daily.  Dosage is correct based on age greater than 45 years old and weight below 60 kg.    Follow up 1 year Afib clinic.   [name], PA-C  Afib Clinic [facility] [address] [place], [place] [address] [phone]   "

## 2024-06-19 ENCOUNTER — Other Ambulatory Visit: Payer: Self-pay | Admitting: Family Medicine

## 2024-06-19 DIAGNOSIS — I10 Essential (primary) hypertension: Secondary | ICD-10-CM

## 2024-06-20 ENCOUNTER — Ambulatory Visit: Admitting: Family Medicine

## 2024-07-13 ENCOUNTER — Ambulatory Visit: Admitting: Family Medicine

## 2024-10-25 ENCOUNTER — Ambulatory Visit: Admitting: Adult Health

## 2024-10-27 ENCOUNTER — Ambulatory Visit: Payer: Self-pay

## 2024-11-07 ENCOUNTER — Ambulatory Visit
